# Patient Record
Sex: Female | Born: 1945 | Race: White | Hispanic: No | Smoking: Former smoker
Health system: Southern US, Community
[De-identification: ages and names within clinical notes are randomized; demographics above are authoritative.]

## PROBLEM LIST (undated history)

## (undated) DIAGNOSIS — C4490 Unspecified malignant neoplasm of skin, unspecified: Secondary | ICD-10-CM

## (undated) DIAGNOSIS — K648 Other hemorrhoids: Secondary | ICD-10-CM

## (undated) DIAGNOSIS — E785 Hyperlipidemia, unspecified: Secondary | ICD-10-CM

## (undated) DIAGNOSIS — F32A Depression, unspecified: Secondary | ICD-10-CM

## (undated) DIAGNOSIS — R0602 Shortness of breath: Secondary | ICD-10-CM

## (undated) DIAGNOSIS — Z9981 Dependence on supplemental oxygen: Secondary | ICD-10-CM

## (undated) DIAGNOSIS — J449 Chronic obstructive pulmonary disease, unspecified: Secondary | ICD-10-CM

## (undated) DIAGNOSIS — C3490 Malignant neoplasm of unspecified part of unspecified bronchus or lung: Secondary | ICD-10-CM

## (undated) DIAGNOSIS — IMO0001 Reserved for inherently not codable concepts without codable children: Secondary | ICD-10-CM

## (undated) DIAGNOSIS — Z923 Personal history of irradiation: Secondary | ICD-10-CM

## (undated) DIAGNOSIS — Z973 Presence of spectacles and contact lenses: Secondary | ICD-10-CM

## (undated) DIAGNOSIS — F1011 Alcohol abuse, in remission: Secondary | ICD-10-CM

## (undated) DIAGNOSIS — R0902 Hypoxemia: Secondary | ICD-10-CM

## (undated) DIAGNOSIS — IMO0002 Reserved for concepts with insufficient information to code with codable children: Secondary | ICD-10-CM

## (undated) DIAGNOSIS — K5790 Diverticulosis of intestine, part unspecified, without perforation or abscess without bleeding: Secondary | ICD-10-CM

## (undated) DIAGNOSIS — C50919 Malignant neoplasm of unspecified site of unspecified female breast: Secondary | ICD-10-CM

## (undated) DIAGNOSIS — F329 Major depressive disorder, single episode, unspecified: Secondary | ICD-10-CM

## (undated) HISTORY — DX: Major depressive disorder, single episode, unspecified: F32.9

## (undated) HISTORY — DX: Diverticulosis of intestine, part unspecified, without perforation or abscess without bleeding: K57.90

## (undated) HISTORY — DX: Reserved for inherently not codable concepts without codable children: IMO0001

## (undated) HISTORY — DX: Malignant neoplasm of unspecified part of unspecified bronchus or lung: C34.90

## (undated) HISTORY — DX: Reserved for concepts with insufficient information to code with codable children: IMO0002

## (undated) HISTORY — PX: DENTAL SURGERY: SHX609

## (undated) HISTORY — DX: Unspecified malignant neoplasm of skin, unspecified: C44.90

## (undated) HISTORY — DX: Chronic obstructive pulmonary disease, unspecified: J44.9

## (undated) HISTORY — DX: Hypoxemia: R09.02

## (undated) HISTORY — DX: Other hemorrhoids: K64.8

## (undated) HISTORY — DX: Hyperlipidemia, unspecified: E78.5

## (undated) HISTORY — DX: Depression, unspecified: F32.A

## (undated) HISTORY — DX: Alcohol abuse, in remission: F10.11

---

## 2001-06-02 HISTORY — PX: CHOLECYSTECTOMY: SHX55

## 2003-06-03 HISTORY — PX: EYE SURGERY: SHX253

## 2006-04-28 ENCOUNTER — Other Ambulatory Visit: Admission: RE | Admit: 2006-04-28 | Discharge: 2006-04-28 | Payer: Self-pay | Admitting: Internal Medicine

## 2006-04-28 LAB — HM PAP SMEAR

## 2008-03-10 ENCOUNTER — Ambulatory Visit: Payer: Self-pay | Admitting: Internal Medicine

## 2008-09-12 ENCOUNTER — Ambulatory Visit: Payer: Self-pay | Admitting: Internal Medicine

## 2008-11-01 ENCOUNTER — Ambulatory Visit: Payer: Self-pay | Admitting: Internal Medicine

## 2008-12-18 ENCOUNTER — Ambulatory Visit: Payer: Self-pay | Admitting: Internal Medicine

## 2009-03-22 ENCOUNTER — Ambulatory Visit: Payer: Self-pay | Admitting: Internal Medicine

## 2009-06-05 ENCOUNTER — Encounter
Admission: RE | Admit: 2009-06-05 | Discharge: 2009-06-05 | Payer: Self-pay | Source: Home / Self Care | Admitting: Internal Medicine

## 2009-09-14 ENCOUNTER — Ambulatory Visit: Payer: Self-pay | Admitting: Internal Medicine

## 2010-03-21 ENCOUNTER — Ambulatory Visit: Payer: Self-pay | Admitting: Internal Medicine

## 2010-06-19 ENCOUNTER — Ambulatory Visit (HOSPITAL_COMMUNITY)
Admission: RE | Admit: 2010-06-19 | Discharge: 2010-06-19 | Payer: Self-pay | Source: Home / Self Care | Attending: Internal Medicine | Admitting: Internal Medicine

## 2010-06-20 ENCOUNTER — Encounter (HOSPITAL_COMMUNITY)
Admission: RE | Admit: 2010-06-20 | Discharge: 2010-07-02 | Payer: Self-pay | Source: Home / Self Care | Attending: Internal Medicine | Admitting: Internal Medicine

## 2010-07-04 ENCOUNTER — Encounter (HOSPITAL_COMMUNITY): Payer: MEDICARE | Attending: Internal Medicine

## 2010-07-04 DIAGNOSIS — Z5189 Encounter for other specified aftercare: Secondary | ICD-10-CM | POA: Insufficient documentation

## 2010-07-04 DIAGNOSIS — J449 Chronic obstructive pulmonary disease, unspecified: Secondary | ICD-10-CM | POA: Insufficient documentation

## 2010-07-04 DIAGNOSIS — J4489 Other specified chronic obstructive pulmonary disease: Secondary | ICD-10-CM | POA: Insufficient documentation

## 2010-07-09 ENCOUNTER — Encounter (HOSPITAL_COMMUNITY): Payer: MEDICARE

## 2010-07-11 ENCOUNTER — Encounter (HOSPITAL_COMMUNITY): Payer: MEDICARE

## 2010-07-16 ENCOUNTER — Encounter (HOSPITAL_COMMUNITY): Payer: MEDICARE

## 2010-07-18 ENCOUNTER — Encounter (HOSPITAL_COMMUNITY): Payer: MEDICARE

## 2010-07-23 ENCOUNTER — Encounter (HOSPITAL_COMMUNITY): Payer: MEDICARE

## 2010-07-25 ENCOUNTER — Encounter (HOSPITAL_COMMUNITY): Payer: MEDICARE

## 2010-07-30 ENCOUNTER — Encounter (HOSPITAL_COMMUNITY): Payer: MEDICARE

## 2010-08-01 ENCOUNTER — Encounter (HOSPITAL_COMMUNITY): Payer: MEDICARE | Attending: Internal Medicine

## 2010-08-01 DIAGNOSIS — J449 Chronic obstructive pulmonary disease, unspecified: Secondary | ICD-10-CM | POA: Insufficient documentation

## 2010-08-01 DIAGNOSIS — J4489 Other specified chronic obstructive pulmonary disease: Secondary | ICD-10-CM | POA: Insufficient documentation

## 2010-08-01 DIAGNOSIS — Z5189 Encounter for other specified aftercare: Secondary | ICD-10-CM | POA: Insufficient documentation

## 2010-08-06 ENCOUNTER — Encounter (HOSPITAL_COMMUNITY): Payer: MEDICARE

## 2010-08-08 ENCOUNTER — Encounter (HOSPITAL_COMMUNITY): Payer: MEDICARE

## 2010-08-13 ENCOUNTER — Encounter (HOSPITAL_COMMUNITY): Payer: MEDICARE

## 2010-08-15 ENCOUNTER — Encounter (HOSPITAL_COMMUNITY): Payer: MEDICARE

## 2010-08-20 ENCOUNTER — Encounter (HOSPITAL_COMMUNITY): Payer: MEDICARE

## 2010-08-22 ENCOUNTER — Encounter (HOSPITAL_COMMUNITY): Payer: MEDICARE

## 2010-08-27 ENCOUNTER — Encounter (HOSPITAL_COMMUNITY): Payer: MEDICARE

## 2010-08-29 ENCOUNTER — Encounter (HOSPITAL_COMMUNITY): Payer: MEDICARE

## 2010-09-03 ENCOUNTER — Encounter (HOSPITAL_COMMUNITY): Payer: MEDICARE | Attending: Internal Medicine

## 2010-09-03 DIAGNOSIS — J449 Chronic obstructive pulmonary disease, unspecified: Secondary | ICD-10-CM | POA: Insufficient documentation

## 2010-09-03 DIAGNOSIS — J4489 Other specified chronic obstructive pulmonary disease: Secondary | ICD-10-CM | POA: Insufficient documentation

## 2010-09-03 DIAGNOSIS — Z5189 Encounter for other specified aftercare: Secondary | ICD-10-CM | POA: Insufficient documentation

## 2010-09-05 ENCOUNTER — Encounter (HOSPITAL_COMMUNITY): Payer: MEDICARE

## 2010-09-10 ENCOUNTER — Encounter (HOSPITAL_COMMUNITY): Payer: MEDICARE

## 2010-09-12 ENCOUNTER — Encounter (HOSPITAL_COMMUNITY): Payer: MEDICARE

## 2010-09-16 ENCOUNTER — Encounter (INDEPENDENT_AMBULATORY_CARE_PROVIDER_SITE_OTHER): Payer: MEDICARE | Admitting: Internal Medicine

## 2010-09-16 DIAGNOSIS — E785 Hyperlipidemia, unspecified: Secondary | ICD-10-CM

## 2010-09-16 DIAGNOSIS — J449 Chronic obstructive pulmonary disease, unspecified: Secondary | ICD-10-CM

## 2010-09-17 ENCOUNTER — Encounter (HOSPITAL_COMMUNITY): Payer: MEDICARE

## 2010-09-19 ENCOUNTER — Encounter (HOSPITAL_COMMUNITY): Payer: MEDICARE

## 2010-09-24 ENCOUNTER — Encounter (HOSPITAL_COMMUNITY)
Admission: RE | Admit: 2010-09-24 | Discharge: 2010-09-24 | Disposition: A | Discharge status: Home / Self Care | Payer: MEDICARE | Source: Ambulatory Visit | Attending: Internal Medicine | Admitting: Internal Medicine

## 2010-09-24 ENCOUNTER — Encounter (HOSPITAL_COMMUNITY): Payer: MEDICARE

## 2010-09-24 DIAGNOSIS — J449 Chronic obstructive pulmonary disease, unspecified: Secondary | ICD-10-CM | POA: Insufficient documentation

## 2010-09-24 DIAGNOSIS — Z5189 Encounter for other specified aftercare: Secondary | ICD-10-CM | POA: Insufficient documentation

## 2010-09-24 DIAGNOSIS — J4489 Other specified chronic obstructive pulmonary disease: Secondary | ICD-10-CM | POA: Insufficient documentation

## 2010-09-26 ENCOUNTER — Encounter (HOSPITAL_COMMUNITY): Payer: MEDICARE

## 2010-10-01 ENCOUNTER — Encounter (HOSPITAL_COMMUNITY): Payer: MEDICARE | Attending: Internal Medicine

## 2010-10-01 ENCOUNTER — Encounter (HOSPITAL_COMMUNITY): Payer: MEDICARE

## 2010-10-01 DIAGNOSIS — Z5189 Encounter for other specified aftercare: Secondary | ICD-10-CM | POA: Insufficient documentation

## 2010-10-01 DIAGNOSIS — J4489 Other specified chronic obstructive pulmonary disease: Secondary | ICD-10-CM | POA: Insufficient documentation

## 2010-10-01 DIAGNOSIS — J449 Chronic obstructive pulmonary disease, unspecified: Secondary | ICD-10-CM | POA: Insufficient documentation

## 2010-10-03 ENCOUNTER — Encounter (HOSPITAL_COMMUNITY): Payer: MEDICARE

## 2010-10-08 ENCOUNTER — Encounter (HOSPITAL_COMMUNITY): Payer: MEDICARE

## 2010-10-10 ENCOUNTER — Encounter (HOSPITAL_COMMUNITY): Payer: MEDICARE

## 2010-10-15 ENCOUNTER — Encounter (HOSPITAL_COMMUNITY): Payer: MEDICARE

## 2010-10-17 ENCOUNTER — Encounter (HOSPITAL_COMMUNITY): Payer: MEDICARE

## 2010-10-22 ENCOUNTER — Encounter (HOSPITAL_COMMUNITY): Payer: MEDICARE

## 2010-10-24 ENCOUNTER — Encounter (HOSPITAL_COMMUNITY): Payer: MEDICARE

## 2010-10-29 ENCOUNTER — Encounter (HOSPITAL_COMMUNITY): Payer: MEDICARE

## 2010-10-31 ENCOUNTER — Encounter (HOSPITAL_COMMUNITY): Payer: MEDICARE

## 2010-11-05 ENCOUNTER — Encounter (HOSPITAL_COMMUNITY): Payer: MEDICARE | Attending: Internal Medicine

## 2010-11-05 DIAGNOSIS — J4489 Other specified chronic obstructive pulmonary disease: Secondary | ICD-10-CM | POA: Insufficient documentation

## 2010-11-05 DIAGNOSIS — Z5189 Encounter for other specified aftercare: Secondary | ICD-10-CM | POA: Insufficient documentation

## 2010-11-05 DIAGNOSIS — J449 Chronic obstructive pulmonary disease, unspecified: Secondary | ICD-10-CM | POA: Insufficient documentation

## 2010-11-07 ENCOUNTER — Encounter (HOSPITAL_COMMUNITY): Payer: MEDICARE

## 2010-11-12 ENCOUNTER — Encounter (HOSPITAL_COMMUNITY): Payer: MEDICARE

## 2010-11-14 ENCOUNTER — Encounter (HOSPITAL_COMMUNITY): Payer: MEDICARE

## 2010-11-19 ENCOUNTER — Encounter (HOSPITAL_COMMUNITY): Payer: MEDICARE

## 2010-11-21 ENCOUNTER — Encounter (HOSPITAL_COMMUNITY): Payer: MEDICARE

## 2010-11-26 ENCOUNTER — Encounter (HOSPITAL_COMMUNITY): Payer: MEDICARE

## 2010-11-28 ENCOUNTER — Encounter (HOSPITAL_COMMUNITY): Payer: MEDICARE

## 2010-12-03 ENCOUNTER — Encounter (HOSPITAL_COMMUNITY): Payer: MEDICARE | Attending: Internal Medicine

## 2010-12-03 DIAGNOSIS — J449 Chronic obstructive pulmonary disease, unspecified: Secondary | ICD-10-CM | POA: Insufficient documentation

## 2010-12-03 DIAGNOSIS — Z5189 Encounter for other specified aftercare: Secondary | ICD-10-CM | POA: Insufficient documentation

## 2010-12-03 DIAGNOSIS — J4489 Other specified chronic obstructive pulmonary disease: Secondary | ICD-10-CM | POA: Insufficient documentation

## 2010-12-04 ENCOUNTER — Other Ambulatory Visit: Payer: Self-pay | Admitting: Internal Medicine

## 2010-12-05 ENCOUNTER — Encounter (HOSPITAL_COMMUNITY): Payer: MEDICARE

## 2010-12-10 ENCOUNTER — Encounter (HOSPITAL_COMMUNITY): Payer: MEDICARE

## 2010-12-12 ENCOUNTER — Encounter (HOSPITAL_COMMUNITY): Payer: MEDICARE

## 2010-12-17 ENCOUNTER — Encounter (HOSPITAL_COMMUNITY): Payer: MEDICARE

## 2010-12-19 ENCOUNTER — Encounter (HOSPITAL_COMMUNITY): Payer: MEDICARE

## 2010-12-24 ENCOUNTER — Encounter (HOSPITAL_COMMUNITY): Payer: MEDICARE

## 2010-12-26 ENCOUNTER — Encounter (HOSPITAL_COMMUNITY): Payer: MEDICARE

## 2010-12-31 ENCOUNTER — Encounter (HOSPITAL_COMMUNITY): Payer: MEDICARE

## 2011-01-02 ENCOUNTER — Encounter (HOSPITAL_COMMUNITY): Payer: MEDICARE | Attending: Internal Medicine

## 2011-01-02 DIAGNOSIS — J449 Chronic obstructive pulmonary disease, unspecified: Secondary | ICD-10-CM | POA: Insufficient documentation

## 2011-01-02 DIAGNOSIS — Z5189 Encounter for other specified aftercare: Secondary | ICD-10-CM | POA: Insufficient documentation

## 2011-01-02 DIAGNOSIS — J4489 Other specified chronic obstructive pulmonary disease: Secondary | ICD-10-CM | POA: Insufficient documentation

## 2011-01-07 ENCOUNTER — Encounter (HOSPITAL_COMMUNITY): Payer: MEDICARE

## 2011-01-09 ENCOUNTER — Encounter (HOSPITAL_COMMUNITY): Payer: MEDICARE

## 2011-01-14 ENCOUNTER — Encounter (HOSPITAL_COMMUNITY): Payer: MEDICARE

## 2011-01-16 ENCOUNTER — Encounter (HOSPITAL_COMMUNITY): Payer: MEDICARE

## 2011-01-21 ENCOUNTER — Encounter (HOSPITAL_COMMUNITY): Payer: MEDICARE

## 2011-01-23 ENCOUNTER — Encounter (HOSPITAL_COMMUNITY): Payer: MEDICARE

## 2011-01-28 ENCOUNTER — Encounter (HOSPITAL_COMMUNITY): Payer: MEDICARE

## 2011-01-30 ENCOUNTER — Encounter (HOSPITAL_COMMUNITY): Payer: MEDICARE

## 2011-02-04 ENCOUNTER — Encounter (HOSPITAL_COMMUNITY): Payer: MEDICARE | Attending: Internal Medicine

## 2011-02-04 DIAGNOSIS — J449 Chronic obstructive pulmonary disease, unspecified: Secondary | ICD-10-CM | POA: Insufficient documentation

## 2011-02-04 DIAGNOSIS — Z5189 Encounter for other specified aftercare: Secondary | ICD-10-CM | POA: Insufficient documentation

## 2011-02-04 DIAGNOSIS — J4489 Other specified chronic obstructive pulmonary disease: Secondary | ICD-10-CM | POA: Insufficient documentation

## 2011-02-06 ENCOUNTER — Encounter (HOSPITAL_COMMUNITY): Payer: MEDICARE

## 2011-02-11 ENCOUNTER — Encounter (HOSPITAL_COMMUNITY): Payer: MEDICARE

## 2011-02-13 ENCOUNTER — Encounter (HOSPITAL_COMMUNITY): Payer: MEDICARE

## 2011-02-18 ENCOUNTER — Encounter (HOSPITAL_COMMUNITY): Payer: MEDICARE

## 2011-02-20 ENCOUNTER — Encounter (HOSPITAL_COMMUNITY): Payer: MEDICARE

## 2011-02-25 ENCOUNTER — Encounter (HOSPITAL_COMMUNITY): Payer: MEDICARE

## 2011-02-27 ENCOUNTER — Encounter (HOSPITAL_COMMUNITY): Payer: MEDICARE

## 2011-03-04 ENCOUNTER — Encounter (HOSPITAL_COMMUNITY): Payer: MEDICARE

## 2011-03-06 ENCOUNTER — Encounter (HOSPITAL_COMMUNITY): Payer: MEDICARE

## 2011-03-11 ENCOUNTER — Encounter (HOSPITAL_COMMUNITY): Payer: MEDICARE

## 2011-03-13 ENCOUNTER — Encounter (HOSPITAL_COMMUNITY): Payer: MEDICARE

## 2011-03-18 ENCOUNTER — Encounter (HOSPITAL_COMMUNITY): Payer: MEDICARE

## 2011-03-20 ENCOUNTER — Ambulatory Visit: Payer: MEDICARE | Admitting: Internal Medicine

## 2011-03-20 ENCOUNTER — Encounter (HOSPITAL_COMMUNITY): Payer: MEDICARE

## 2011-03-25 ENCOUNTER — Encounter (HOSPITAL_COMMUNITY): Payer: MEDICARE

## 2011-03-27 ENCOUNTER — Encounter (HOSPITAL_COMMUNITY): Payer: MEDICARE

## 2011-04-01 ENCOUNTER — Encounter (HOSPITAL_COMMUNITY): Payer: MEDICARE

## 2011-04-03 ENCOUNTER — Encounter (HOSPITAL_COMMUNITY): Payer: MEDICARE

## 2011-04-07 ENCOUNTER — Ambulatory Visit: Payer: MEDICARE | Admitting: Internal Medicine

## 2011-04-08 ENCOUNTER — Encounter (HOSPITAL_COMMUNITY): Payer: MEDICARE

## 2011-04-08 ENCOUNTER — Encounter: Payer: Self-pay | Admitting: Internal Medicine

## 2011-04-10 ENCOUNTER — Encounter (HOSPITAL_COMMUNITY): Payer: MEDICARE

## 2011-04-11 ENCOUNTER — Encounter: Payer: Self-pay | Admitting: Internal Medicine

## 2011-04-11 ENCOUNTER — Ambulatory Visit: Payer: MEDICARE | Admitting: Internal Medicine

## 2011-04-11 ENCOUNTER — Ambulatory Visit (INDEPENDENT_AMBULATORY_CARE_PROVIDER_SITE_OTHER): Payer: MEDICARE | Admitting: Internal Medicine

## 2011-04-11 DIAGNOSIS — E785 Hyperlipidemia, unspecified: Secondary | ICD-10-CM

## 2011-04-11 DIAGNOSIS — F259 Schizoaffective disorder, unspecified: Secondary | ICD-10-CM

## 2011-04-11 DIAGNOSIS — Z79899 Other long term (current) drug therapy: Secondary | ICD-10-CM

## 2011-04-11 DIAGNOSIS — J449 Chronic obstructive pulmonary disease, unspecified: Secondary | ICD-10-CM

## 2011-04-11 DIAGNOSIS — E559 Vitamin D deficiency, unspecified: Secondary | ICD-10-CM

## 2011-04-11 DIAGNOSIS — J441 Chronic obstructive pulmonary disease with (acute) exacerbation: Secondary | ICD-10-CM | POA: Insufficient documentation

## 2011-04-11 LAB — HEPATIC FUNCTION PANEL
Bilirubin, Direct: 0.2 mg/dL (ref 0.0–0.3)
Total Bilirubin: 0.7 mg/dL (ref 0.3–1.2)
Total Protein: 7.1 g/dL (ref 6.0–8.3)

## 2011-04-11 LAB — LIPID PANEL
Total CHOL/HDL Ratio: 3.1 Ratio
Triglycerides: 115 mg/dL (ref <150)

## 2011-04-11 NOTE — Progress Notes (Signed)
  Subjective:    Patient ID: Summer Williams, female    DOB: June 18, 1945, 65 y.o.   MRN: [id number]  HPI  65 year old white female with history of hyperlipidemia, vitamin D deficiency, COPD and schizoaffective disorder for six-month recheck. Fasting lipid panel liver functions drawn on Lipitor 40 mg daily. She also uses Advair and albuterol. I tried to enroll her in pulmonary rehabilitation class at [facility] but apparently the class was full. History of cholecystectomy 2003 when she lived in [place]. History of cataract extraction right eye by Dr. Dagoberto Ligas 2005. Pneumovax given April 2011, she declines influenza immunization, T. Dap given April 2011. Patient resides with her elderly mother. She is a recovering alcoholic and last had an alcoholic drink in 1980. 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 She has a son who lives here. Has never had colonoscopy. Declines to do so. No recent mammogram. Had chest x-ray January 2011 showing no active lung disease.  Family history mother with hypertension and hypothyroidism father died at 57 of sudden cardiac arrest  Social history: Patient has a college degree and is divorced. Quit smoking 2007 after smoking for some 50 years.    Review of Systems     Objective:   Physical Exam neck is supple without JVD thyromegaly or carotid bruits; chest clear to auscultation; cardiac: regular rate, and rhythm; extremities: without edema        Assessment & Plan:  Hyperlipidemia  COPD  Plan: Fasting lipid panel liver functions drawn and are pending of Lipitor. Return in 6 months for physical exam.

## 2011-04-11 NOTE — Patient Instructions (Signed)
Continue Lipitor as directed. Continue using albuterol and Advair inhalers daily. Return in 6 months for physical exam.

## 2011-04-15 ENCOUNTER — Encounter (HOSPITAL_COMMUNITY): Payer: MEDICARE

## 2011-04-16 ENCOUNTER — Telehealth: Payer: Self-pay

## 2011-04-16 DIAGNOSIS — R6889 Other general symptoms and signs: Secondary | ICD-10-CM

## 2011-04-16 NOTE — Telephone Encounter (Signed)
Patient scheduledfor abd ultrasound on 04/18/2011 at 8:15 at [facility], [address]. Appt to discuss findings on Tuesday 04/22/2011 at 3:30pm

## 2011-04-17 ENCOUNTER — Encounter (HOSPITAL_COMMUNITY): Payer: MEDICARE

## 2011-04-18 ENCOUNTER — Ambulatory Visit
Admission: RE | Admit: 2011-04-18 | Discharge: 2011-04-18 | Disposition: A | Discharge status: Home / Self Care | Payer: Medicare Other | Source: Ambulatory Visit | Attending: Internal Medicine | Admitting: Internal Medicine

## 2011-04-18 DIAGNOSIS — R6889 Other general symptoms and signs: Secondary | ICD-10-CM

## 2011-04-22 ENCOUNTER — Encounter (HOSPITAL_COMMUNITY): Payer: MEDICARE

## 2011-04-22 ENCOUNTER — Ambulatory Visit (INDEPENDENT_AMBULATORY_CARE_PROVIDER_SITE_OTHER): Payer: MEDICARE | Admitting: Internal Medicine

## 2011-04-22 ENCOUNTER — Encounter: Payer: Self-pay | Admitting: Internal Medicine

## 2011-04-22 DIAGNOSIS — R748 Abnormal levels of other serum enzymes: Secondary | ICD-10-CM

## 2011-04-22 DIAGNOSIS — K7689 Other specified diseases of liver: Secondary | ICD-10-CM

## 2011-04-22 DIAGNOSIS — K760 Fatty (change of) liver, not elsewhere classified: Secondary | ICD-10-CM

## 2011-04-22 DIAGNOSIS — E785 Hyperlipidemia, unspecified: Secondary | ICD-10-CM

## 2011-04-22 DIAGNOSIS — F259 Schizoaffective disorder, unspecified: Secondary | ICD-10-CM

## 2011-04-24 ENCOUNTER — Encounter (HOSPITAL_COMMUNITY): Payer: MEDICARE

## 2011-04-29 ENCOUNTER — Encounter (HOSPITAL_COMMUNITY): Payer: MEDICARE

## 2011-05-01 ENCOUNTER — Encounter (HOSPITAL_COMMUNITY): Payer: MEDICARE

## 2011-05-03 DIAGNOSIS — R748 Abnormal levels of other serum enzymes: Secondary | ICD-10-CM | POA: Insufficient documentation

## 2011-05-03 NOTE — Progress Notes (Signed)
  Subjective:    Patient ID: Summer Williams, female    DOB: 12-18-1945, 65 y.o.   MRN: [id number]  HPI patient was here recently for evaluation of hyperlipidemia. Liver enzymes were noted to be a bit elevated and I suggested she undergo further evaluation with an ultrasound of liver gallbladder and pancreas. She does have a history of schizoaffective disorder and takes multiple medications to control including antidepressants and anti-anxiety medications. Denies alcohol consumption. Not taking any over-the-counter medications to excess. Recent ultrasound shows no evidence of bile but dilatation just probable fatty liver infiltration.    Review of Systems     Objective:   Physical Exam chest clear, cardiac exam regular rate and rhythm; abdomen no hepatosplenomegaly masses or tenderness        Assessment & Plan:  Elevated liver enzymes-could be do to fatty liver or medication related  Fatty liver infiltration  History of schizoaffective disorder on multiple medications  Plan recheck liver functions in 3-6 months

## 2011-05-03 NOTE — Patient Instructions (Signed)
No evidence of gallbladder disease. Try to lose some weight. Antidepressant and anti-anxiety medications could contribute to elevated liver functions in addition to obesity. We can consider stopping statin medication if liver functions continued to rise.

## 2011-05-06 ENCOUNTER — Encounter (HOSPITAL_COMMUNITY): Payer: MEDICARE

## 2011-05-08 ENCOUNTER — Encounter (HOSPITAL_COMMUNITY): Payer: MEDICARE

## 2011-05-13 ENCOUNTER — Encounter (HOSPITAL_COMMUNITY): Payer: MEDICARE

## 2011-05-15 ENCOUNTER — Encounter (HOSPITAL_COMMUNITY): Payer: MEDICARE

## 2011-05-20 ENCOUNTER — Encounter (HOSPITAL_COMMUNITY): Payer: MEDICARE

## 2011-05-22 ENCOUNTER — Encounter (HOSPITAL_COMMUNITY): Payer: MEDICARE

## 2011-05-27 ENCOUNTER — Encounter (HOSPITAL_COMMUNITY): Payer: MEDICARE

## 2011-05-29 ENCOUNTER — Encounter (HOSPITAL_COMMUNITY): Payer: MEDICARE

## 2011-06-03 ENCOUNTER — Encounter (HOSPITAL_COMMUNITY): Payer: MEDICARE

## 2011-06-05 ENCOUNTER — Encounter (HOSPITAL_COMMUNITY): Payer: MEDICARE

## 2011-06-05 ENCOUNTER — Other Ambulatory Visit: Payer: MEDICARE | Admitting: Internal Medicine

## 2011-06-05 DIAGNOSIS — R6889 Other general symptoms and signs: Secondary | ICD-10-CM

## 2011-06-05 LAB — HEPATIC FUNCTION PANEL
Albumin: 4.3 g/dL (ref 3.5–5.2)
Alkaline Phosphatase: 115 U/L (ref 39–117)
Indirect Bilirubin: 0.5 mg/dL (ref 0.0–0.9)
Total Bilirubin: 0.6 mg/dL (ref 0.3–1.2)

## 2011-06-10 ENCOUNTER — Encounter (HOSPITAL_COMMUNITY): Payer: MEDICARE

## 2011-06-12 ENCOUNTER — Encounter (HOSPITAL_COMMUNITY): Payer: MEDICARE

## 2011-06-17 ENCOUNTER — Encounter (HOSPITAL_COMMUNITY): Payer: MEDICARE

## 2011-06-19 ENCOUNTER — Encounter (HOSPITAL_COMMUNITY): Payer: MEDICARE

## 2011-06-26 ENCOUNTER — Encounter (HOSPITAL_COMMUNITY): Payer: MEDICARE

## 2011-06-30 ENCOUNTER — Other Ambulatory Visit: Payer: Self-pay | Admitting: Internal Medicine

## 2011-07-01 ENCOUNTER — Encounter (HOSPITAL_COMMUNITY): Payer: MEDICARE

## 2011-07-03 ENCOUNTER — Encounter (HOSPITAL_COMMUNITY): Payer: MEDICARE

## 2011-07-08 ENCOUNTER — Encounter (HOSPITAL_COMMUNITY): Payer: MEDICARE

## 2011-07-10 ENCOUNTER — Encounter (HOSPITAL_COMMUNITY): Payer: MEDICARE

## 2011-07-15 ENCOUNTER — Encounter (HOSPITAL_COMMUNITY): Payer: MEDICARE

## 2011-07-17 ENCOUNTER — Encounter (HOSPITAL_COMMUNITY): Payer: MEDICARE

## 2011-07-22 ENCOUNTER — Encounter (HOSPITAL_COMMUNITY): Payer: MEDICARE

## 2011-07-24 ENCOUNTER — Encounter (HOSPITAL_COMMUNITY): Payer: MEDICARE

## 2011-07-29 ENCOUNTER — Encounter (HOSPITAL_COMMUNITY): Payer: MEDICARE

## 2011-10-17 ENCOUNTER — Ambulatory Visit (INDEPENDENT_AMBULATORY_CARE_PROVIDER_SITE_OTHER): Payer: MEDICARE | Admitting: Internal Medicine

## 2011-10-17 ENCOUNTER — Encounter: Payer: Self-pay | Admitting: Internal Medicine

## 2011-10-17 VITALS — BP 124/74 | HR 80 | Temp 97.9°F | Resp 12 | Ht 64.25 in | Wt 186.0 lb

## 2011-10-17 DIAGNOSIS — E785 Hyperlipidemia, unspecified: Secondary | ICD-10-CM

## 2011-10-17 DIAGNOSIS — Z0000 Encounter for general adult medical examination without abnormal findings: Secondary | ICD-10-CM

## 2011-10-17 DIAGNOSIS — R748 Abnormal levels of other serum enzymes: Secondary | ICD-10-CM

## 2011-10-17 DIAGNOSIS — E669 Obesity, unspecified: Secondary | ICD-10-CM

## 2011-10-17 DIAGNOSIS — F259 Schizoaffective disorder, unspecified: Secondary | ICD-10-CM

## 2011-10-17 DIAGNOSIS — E559 Vitamin D deficiency, unspecified: Secondary | ICD-10-CM

## 2011-10-17 DIAGNOSIS — J449 Chronic obstructive pulmonary disease, unspecified: Secondary | ICD-10-CM

## 2011-10-17 LAB — COMPREHENSIVE METABOLIC PANEL
Albumin: 4.6 g/dL (ref 3.5–5.2)
Alkaline Phosphatase: 101 U/L (ref 39–117)
BUN: 11 mg/dL (ref 6–23)
CO2: 27 mEq/L (ref 19–32)
Calcium: 9.3 mg/dL (ref 8.4–10.5)
Chloride: 104 mEq/L (ref 96–112)
Glucose, Bld: 102 mg/dL — ABNORMAL HIGH (ref 70–99)
Potassium: 4 mEq/L (ref 3.5–5.3)
Sodium: 142 mEq/L (ref 135–145)
Total Bilirubin: 1 mg/dL (ref 0.3–1.2)

## 2011-10-17 LAB — CBC WITH DIFFERENTIAL/PLATELET
Basophils Absolute: 0 10*3/uL (ref 0.0–0.1)
MCH: 32.1 pg (ref 26.0–34.0)
MCHC: 34 g/dL (ref 30.0–36.0)
MCV: 94.4 fL (ref 78.0–100.0)
Monocytes Absolute: 1 10*3/uL (ref 0.1–1.0)
Monocytes Relative: 16 % — ABNORMAL HIGH (ref 3–12)
RBC: 4.64 MIL/uL (ref 3.87–5.11)
RDW: 14.3 % (ref 11.5–15.5)
WBC: 6.5 10*3/uL (ref 4.0–10.5)

## 2011-10-17 LAB — TSH: TSH: 4.298 u[IU]/mL (ref 0.350–4.500)

## 2011-10-17 LAB — POCT URINALYSIS DIPSTICK
Bilirubin, UA: NEGATIVE
Leukocytes, UA: NEGATIVE

## 2011-10-17 LAB — LIPID PANEL
Total CHOL/HDL Ratio: 2.9 Ratio
Triglycerides: 171 mg/dL — ABNORMAL HIGH (ref <150)
VLDL: 34 mg/dL (ref 0–40)

## 2011-10-17 NOTE — Progress Notes (Signed)
Subjective:    Patient ID: Summer Williams, female    DOB: Jun 27, 1945, 66 y.o.   MRN: [id number]  HPI 66 year old White female with hx of depression, hyperlipidemia and allergic rhinitis. History of elevated liver functions presumably due to fatty liver and not statin therapy. Was on statin therapy for a long time before we noticed elevated liver functions. Had upper abdominal ultrasound done November 2012 showing mild diffuse fatty liver. Gallbladder had been surgically removed. Not really motivated to diet and exercise.  She has history of alcohol abuse in the remote past. Cholecystectomy 15-Nov-2001 in [place]. Cataract extraction right eye by Dr. Dagoberto Ligas 2005.Tdap vaccine April 2011. Pneumovax immunization April 2011. Sees Dr. Nolen Mu for psychiatric care. She has been going to pulmonary rehabilitation classes. She does not want to be on home oxygen. Zyprexa may be contributing to her weight gain. Never had colonoscopy or flexible sigmoidoscopy and refuses. Refuses breast and pelvic exam. Refuses mammogram.  Social history she is divorced and lives with her mother. Mother is in her 39s. 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 She smoked up to a pack of cigarettes daily for many years but quit smoking in early November 15, 2005 after having smoked for 50 years. She last had an alcoholic drink in 1978/11/16.  Family history father died at age 43 of cardiac arrest. She said father had history of epilepsy. Grandmother died of suicide. Both grandfathers with history of cancer. She has a son who lives here in [place].    Review of Systems  Constitutional: Negative.   HENT: Negative.   Eyes: Negative.   Respiratory: Negative.   Cardiovascular: Negative.   Gastrointestinal: Negative.   Genitourinary: Negative.   Musculoskeletal: Positive for arthralgias.  Neurological: Negative.   Hematological: Negative.   Psychiatric/Behavioral: Positive for dysphoric mood.         Objective:   Physical Exam  Vitals reviewed. Constitutional: She is oriented to person, place, and time. She appears well-developed and well-nourished. No distress.  HENT:  Head: Normocephalic and atraumatic.  Right Ear: External ear normal.  Left Ear: External ear normal.  Nose: Nose normal.  Mouth/Throat: Oropharynx is clear and moist. No oropharyngeal exudate.  Eyes: Conjunctivae and EOM are normal. Pupils are equal, round, and reactive to light. Right eye exhibits no discharge. Left eye exhibits no discharge. No scleral icterus.  Neck: Normal range of motion. Neck supple. No JVD present. No thyromegaly present.  Cardiovascular: Normal rate, regular rhythm, normal heart sounds and intact distal pulses.   No murmur heard. Pulmonary/Chest: Effort normal. No respiratory distress. She has wheezes. She has no rales. She exhibits no tenderness.       Breast exam - pt refused  Abdominal: Soft. Bowel sounds are normal. She exhibits no distension. There is no tenderness. There is no rebound and no guarding.  Genitourinary:       Pt refused  Musculoskeletal: Normal range of motion. She exhibits no edema.  Lymphadenopathy:    She has no cervical adenopathy.  Neurological: She is alert and oriented to person, place, and time. She has normal reflexes. No cranial nerve deficit. Coordination normal.  Skin: Skin is warm and dry. She is not diaphoretic.  Psychiatric: She has a normal mood and affect. Her behavior is normal. Judgment and thought content normal.   Subjective:   Patient presents for Medicare Annual/Subsequent preventive examination.   Review Past Medical/Family/Social: See above   Risk Factors  Current exercise habits:  Dietary issues discussed:   Cardiac risk factors: Obesity  Depression Screen  (Note: if answer to either of the following is "Yes", a more complete depression screening is indicated)  Over the past two weeks, have you felt down, depressed or hopeless? Yes   Over the past two weeks, have you felt little interest or pleasure in doing things? Yes  Have you lost interest or pleasure in daily life?no Do you often feel hopeless? no Do you cry easily over simple problems? No   Activities of Daily Living  In your present state of health, do you have any difficulty performing the following activities?:  Driving? No  Managing money? No  Feeding yourself? No  Getting from bed to chair? No  Climbing a flight of stairs? No  Preparing food and eating?: No  Bathing or showering? No  Getting dressed: No  Getting to the toilet? No  Using the toilet:No  Moving around from place to place: No  In the past year have you fallen or had a near fall?:No  Are you sexually active? No  Do you have more than one partner? No   Hearing Difficulties: No  Do you often ask people to speak up or repeat themselves? No  Do you experience ringing or noises in your ears? Yes  Do you have difficulty understanding soft or whispered voices? No  Do you feel that you have a problem with memory? no Do you often misplace items? No  Do you feel safe at home? Yes   Cognitive Testing  Alert? Yes Normal Appearance?Yes  Oriented to person? Yes Place? Yes  Time? Yes  Recall of three objects? Yes  Can perform simple calculations? Yes  Displays appropriate judgment?Yes  Can read the correct time from a watch face?Yes   List the Names of Other Physician/Practitioners you currently use:  Dr. Nolen Mu, psychiatry  Indicate any recent Medical Services you may have received from other than [facility] providers in the past year (date may be approximate).   Screening Tests / Date Colonoscopy  refuses                   Zostavax reminded Mammogram refuses Influenza Vaccine reminded   Tetanus/tdap April 2011   Objective:       General appearance: Appears stated age and mildly obese  Head: Normocephalic, without obvious abnormality, atraumatic  Eyes: conj clear, EOMi PEERLA   Ears: normal TM's and external ear canals both ears  Nose: Nares normal. Septum midline. Mucosa normal. No drainage or sinus tenderness.  Throat: lips, mucosa, and tongue normal; teeth and gums normal  Neck: no adenopathy, no carotid bruit, no JVD, supple, symmetrical, trachea midline and thyroid not enlarged, symmetric, no tenderness/mass/nodules  No CVA tenderness.  Lungs: clear to auscultation bilaterally  Breasts: normal appearance, no masses or tenderness, top of the pacemaker on left upper chest. Incision well-healed. It is tender.  Heart: regular rate and rhythm, S1, S2 normal, no murmur, click, rub or gallop  Abdomen: soft, non-tender; bowel sounds normal; no masses, no organomegaly  Musculoskeletal: ROM normal in all joints, no crepitus, no deformity, Normal muscle strengthen. Back  is symmetric, no curvature. Skin: Skin color, texture, turgor normal. No rashes or lesions  Lymph nodes: Cervical, supraclavicular, and axillary nodes normal.  Neurologic: CN 2 -12 Normal, Normal symmetric reflexes. Normal coordination and gait  Psych: Alert & Oriented x 3, Mood appear stable.    Assessment:    Annual wellness medicare exam   Plan:  During the course of the visit the patient was educated and counseled about appropriate screening and preventive services including:  Screening mammography  Colorectal cancer screening  Shingles vaccine. Prescription given so that she can get the vaccine at the pharmacy.    Medicare Attestation  I have personally reviewed:  The patient's medical and social history  Their use of alcohol, tobacco or illicit drugs  Their current medications and supplements  The patient's functional ability including ADLs,fall risks, home safety risks, cognitive, and hearing and visual impairment  Diet and physical activities  Evidence for depression or mood disorders  The patient's weight, height, BMI, and visual acuity have been recorded in the chart. I have made  referrals, counseling, and provided education to the patient based on review of the above and I have provided the patient with a written personalized care plan for preventive services.           Assessment & Plan:  Hyperlipidemia-stable on statin therapy. Allergic rhinitis History of schizoaffective disorder  COPD-stable  History of elevated liver functions presumably due to fatty liver rather than statin therapy  History of vitamin D deficiency  Obesity      Plan: Labs drawn today. Return in 6 months. Refuses colonoscopy. 3 hemoccult cards given. mamogram requisition given- she will consider it.

## 2011-10-18 LAB — VITAMIN D 25 HYDROXY (VIT D DEFICIENCY, FRACTURES): Vit D, 25-Hydroxy: 41 ng/mL (ref 30–89)

## 2011-12-02 ENCOUNTER — Encounter: Payer: Self-pay | Admitting: Internal Medicine

## 2011-12-28 ENCOUNTER — Other Ambulatory Visit: Payer: Self-pay | Admitting: Internal Medicine

## 2012-04-20 ENCOUNTER — Encounter: Payer: Self-pay | Admitting: Internal Medicine

## 2012-04-20 ENCOUNTER — Ambulatory Visit (INDEPENDENT_AMBULATORY_CARE_PROVIDER_SITE_OTHER): Payer: MEDICARE | Admitting: Internal Medicine

## 2012-04-20 VITALS — BP 112/68 | HR 76 | Temp 97.7°F | Wt 187.5 lb

## 2012-04-20 DIAGNOSIS — J449 Chronic obstructive pulmonary disease, unspecified: Secondary | ICD-10-CM

## 2012-04-20 DIAGNOSIS — F32A Depression, unspecified: Secondary | ICD-10-CM

## 2012-04-20 DIAGNOSIS — E785 Hyperlipidemia, unspecified: Secondary | ICD-10-CM

## 2012-04-20 DIAGNOSIS — K7689 Other specified diseases of liver: Secondary | ICD-10-CM

## 2012-04-20 DIAGNOSIS — Z79899 Other long term (current) drug therapy: Secondary | ICD-10-CM

## 2012-04-20 DIAGNOSIS — K760 Fatty (change of) liver, not elsewhere classified: Secondary | ICD-10-CM

## 2012-04-20 DIAGNOSIS — Z8639 Personal history of other endocrine, nutritional and metabolic disease: Secondary | ICD-10-CM

## 2012-04-20 DIAGNOSIS — F329 Major depressive disorder, single episode, unspecified: Secondary | ICD-10-CM

## 2012-04-20 NOTE — Patient Instructions (Addendum)
Restart vitamin D supplement daily. Consider Zostavax vaccine. Influenza immunization declined. Return in 6 months for physical exam.

## 2012-04-20 NOTE — Addendum Note (Signed)
Addended by: Judy Pimple on: 04/20/2012 11:55 AM   Modules accepted: Orders

## 2012-04-20 NOTE — Progress Notes (Signed)
  Subjective:    Patient ID: Summer Williams, female    DOB: 04/08/1946, 66 y.o.   MRN: [id number]  HPI For follow up hyperlipidemia. Pt is retired but caring for elderly 29 year old mother. Looking for agency to help give her a break. Pt. feeling confined and can't get out much. Daughter-in-law helps out a couple of hours a week. No other complaints. Taking lipid medication. Depression meds refilled by Dr. Nolen Mu. Declines influenza medication. Will consider Zostavax but reluctant. History of COPD. Tried inhaler but did not seem to do any good. Quit smoking 2007. Admits to stopping vitamin D supplementation. Reminded she needs to take this daily for levels will drop. History of elevated liver functions presumably due to fatty liver infiltration. Was on statin therapy for a long time before we noticed elevated liver functions. Abdominal ultrasound done November 2012 showed mild diffuse fatty liver. Gallbladder has been surgically removed. Not really motivated to diet and exercise.  History of alcohol abuse in the remote past. Cholecystectomy 2003 in [place]. Cataract extraction right eye Dr. Dagoberto Ligas 2005. Pneumovax immunization April 2011. Has been going to pulmonary rehabilitation classes. Does not want to be on home oxygen. Zyprexa likely contributing to her weight gain. Never had colonoscopy or flexible sigmoidoscopy and refuses to do so. Refuses breast and pelvic exam. Refuses mammogram.  She is divorced and lives with her mother. 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 She smoked up to a pack of cigarettes daily for many years. She smoked for 50 years. She last had an alcoholic drink in 1980.  Family history: Father died at age 36 of cardiac arrest. Father had history of epilepsy. Grandmother died of suicide. Both grandfathers with history of cancer. She has a son who resides here in [place].    Review of Systems     Objective:   Physical ExamChest is  clear to auscultation. Cor: RRR normal S1/S2. Ext: no edema. Skin is warm and dry. Pulses regular. Alert and oriented x3. Affect is a bit flat but appropriate. TMs and pharynx are clear. No hepatosplenomegaly.        Assessment & Plan:  COPD  Hyperlipidemia  Depression  History of fatty liver infiltration with mild elevated liver functions  History of vitamin D deficiency -has stopped vitamin D. supplement. Needs to resume this daily.  Plan: Return mid May for physical examination. Continue same medications. Prescription given for Zostavax vaccine if pharmacy if she so desires.  Fall screen: No falls in the past year

## 2012-04-21 LAB — HEPATIC FUNCTION PANEL
ALT: 50 U/L — ABNORMAL HIGH (ref 0–35)
AST: 60 U/L — ABNORMAL HIGH (ref 0–37)
Alkaline Phosphatase: 100 U/L (ref 39–117)
Bilirubin, Direct: 0.3 mg/dL (ref 0.0–0.3)
Indirect Bilirubin: 1 mg/dL — ABNORMAL HIGH (ref 0.0–0.9)
Total Bilirubin: 1.3 mg/dL — ABNORMAL HIGH (ref 0.3–1.2)
Total Protein: 7.4 g/dL (ref 6.0–8.3)

## 2012-04-21 LAB — LIPID PANEL
HDL: 54 mg/dL (ref >39)
Triglycerides: 154 mg/dL — ABNORMAL HIGH (ref <150)

## 2012-05-20 ENCOUNTER — Ambulatory Visit
Admission: RE | Admit: 2012-05-20 | Discharge: 2012-05-20 | Disposition: A | Discharge status: Home / Self Care | Payer: PRIVATE HEALTH INSURANCE | Source: Ambulatory Visit | Attending: Internal Medicine | Admitting: Internal Medicine

## 2012-05-20 ENCOUNTER — Ambulatory Visit (INDEPENDENT_AMBULATORY_CARE_PROVIDER_SITE_OTHER): Payer: PRIVATE HEALTH INSURANCE | Admitting: Internal Medicine

## 2012-05-20 ENCOUNTER — Encounter: Payer: Self-pay | Admitting: Internal Medicine

## 2012-05-20 VITALS — BP 110/62 | HR 104 | Temp 99.1°F | Wt 182.0 lb

## 2012-05-20 DIAGNOSIS — R062 Wheezing: Secondary | ICD-10-CM

## 2012-05-20 DIAGNOSIS — J449 Chronic obstructive pulmonary disease, unspecified: Secondary | ICD-10-CM

## 2012-05-20 DIAGNOSIS — J40 Bronchitis, not specified as acute or chronic: Secondary | ICD-10-CM

## 2012-05-20 MED ORDER — CEFTRIAXONE SODIUM 1 G IJ SOLR
1.0000 g | Freq: Once | INTRAMUSCULAR | Status: AC
Start: 2012-05-20 — End: 2012-05-20
  Administered 2012-05-20: 1 g via INTRAMUSCULAR

## 2012-05-20 NOTE — Progress Notes (Signed)
Patient aware.

## 2012-05-20 NOTE — Patient Instructions (Addendum)
Take Levaquin 500 mg daily for 10 days, use albuterol inhaler 2 sprays  Four times daily, use Advair inhaler twice daily. Take 12 day Prednisone taper.

## 2012-06-05 ENCOUNTER — Encounter: Payer: Self-pay | Admitting: Internal Medicine

## 2012-06-05 NOTE — Progress Notes (Signed)
  Subjective:    Patient ID: Summer Williams, female    DOB: 09-29-1945, 67 y.o.   MRN: [id number]  HPI 67 year old white female with history of COPD in today with acute respiratory infection. Has had coughing congestion. Some discolored sputum production. No documented fever. O2 sat is 88-89% on room air. Some shortness of breath. She does have an albuterol inhaler and Advair inhaler but I don't think she's been using these. No shaking chills. Sounds nasally congested.    Review of Systems     Objective:   Physical Exam HEENT exam: Pharynx slightly injected. TMs are clear. Neck is supple without adenopathy. Chest bilateral inspiratory wheezing. No rales.        Assessment & Plan:  COPD  Bronchitis  Plan: Sterapred DS 10 mg 12 day dosepak. Albuterol inhaler 2 sprays by mouth 4 times a day. Advair inhaler 250/50 one spray by mouth every 12 hours. Levaquin 500 milligrams daily for 10 days. Call if not better in 24-48 hours or sooner if worse. Chest x-ray shows hyperaeration and no infiltrate.

## 2012-06-30 ENCOUNTER — Institutional Professional Consult (permissible substitution): Payer: PRIVATE HEALTH INSURANCE | Admitting: Internal Medicine

## 2012-07-14 ENCOUNTER — Other Ambulatory Visit: Payer: Self-pay | Admitting: Internal Medicine

## 2012-07-21 ENCOUNTER — Ambulatory Visit (INDEPENDENT_AMBULATORY_CARE_PROVIDER_SITE_OTHER): Payer: Medicare Other | Admitting: Internal Medicine

## 2012-07-21 ENCOUNTER — Institutional Professional Consult (permissible substitution): Payer: PRIVATE HEALTH INSURANCE | Admitting: Internal Medicine

## 2012-07-21 ENCOUNTER — Encounter: Payer: Self-pay | Admitting: Internal Medicine

## 2012-07-21 ENCOUNTER — Other Ambulatory Visit: Payer: Medicare Other

## 2012-07-21 VITALS — BP 122/80 | HR 85 | Ht 64.5 in | Wt 189.0 lb

## 2012-07-21 DIAGNOSIS — J449 Chronic obstructive pulmonary disease, unspecified: Secondary | ICD-10-CM

## 2012-07-21 MED ORDER — TIOTROPIUM BROMIDE MONOHYDRATE 18 MCG IN CAPS: 18.0000 ug | ORAL_CAPSULE | Freq: Every day | RESPIRATORY_TRACT | Status: DC

## 2012-07-21 NOTE — Progress Notes (Signed)
07/21/12- 67 F former smoker-Self referral-COPD(pt states dx'd few years ago at [facility]) She complains of dyspnea on exertion, up and down stairs and relieved by rest. Episode of bronchitis at Christmas with shortness of breath. Had done pulmonary rehabilitation. She paces herself. She may wheeze a little. Cough at times, productive of white sputum with no chest pain, blood or fever. Little variation in exercise tolerance from day to day. Breathing does not wake her. Neither Advair nor pro air were much help. She denies history of heart disease, seasonal allergy or asthma. Past history of a pneumonia. Denies anemia, glaucoma or peripheral edema. 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 CXR12/19/13- IMPRESSION:  No pneumonia. Peribronchial thickening most likely represents  bronchitis.  Original Report Authenticated By: Dwyane Dee, M.D.  Prior to Admission medications   Medication Sig Start Date End Date Taking? Authorizing Provider  amitriptyline (ELAVIL) 50 MG tablet Take 50 mg by mouth at bedtime.     Yes Historical Provider, MD  atorvastatin (LIPITOR) 40 MG tablet TAKE 1 TABLET BY MOUTH EVERY DAY 07/14/12  Yes Margaree Mackintosh, MD  Cholecalciferol (VITAMIN D) 2000 UNITS CAPS Take by mouth.     Yes Historical Provider, MD  citalopram (CELEXA) 20 MG tablet Take 20 mg by mouth daily.     Yes Historical Provider, MD  clonazePAM (KLONOPIN) 0.5 MG tablet Take 0.5 mg by mouth 2 (two) times daily as needed.     Yes Historical Provider, MD  Fluticasone-Salmeterol (ADVAIR) 250-50 MCG/DOSE AEPB Inhale 1 puff into the lungs every 12 (twelve) hours.     Yes Historical Provider, MD  OLANZapine (ZYPREXA) 7.5 MG tablet Take 7.5 mg by mouth at bedtime.     Yes Historical Provider, MD  PROAIR HFA 108 (90 BASE) MCG/ACT inhaler Inhale 2 puffs into the lungs 4 (four) times daily as needed. 07/14/12  Yes Historical Provider, MD  Multiple Vitamin  (MULTIVITAMIN) tablet Take 1 tablet by mouth daily.      Historical Provider, MD  tiotropium (SPIRIVA) 18 MCG inhalation capsule Place 1 capsule (18 mcg total) into inhaler and inhale daily. 07/21/12   Waymon Budge, MD   Past Medical History  Diagnosis Date  . Schizoaffective disorder   . History of alcohol abuse   . Hyperlipidemia   . Vitamin D deficiency   . COPD (chronic obstructive pulmonary disease)    Past Surgical History  Procedure Laterality Date  . Cholecystectomy    . Eye surgery  2005    Cataract OD   Family History  Problem Relation Age of Onset  . Heart disease Father   . Cancer Maternal Grandfather   . Cancer Paternal Grandfather    History   Social History  . Marital Status: Divorced    Spouse Name: N/A    Number of Children: N/A  . Years of Education: N/A   Occupational History  . Not on file.   Social History Main Topics  . Smoking status: Former Smoker -- 1.00 packs/day for 50 years    Types: Cigarettes    Quit date: 04/10/2006  . Smokeless tobacco: Never Used  . Alcohol Use: No  . Drug Use: No  . Sexually Active: Not on file   Other Topics Concern  . Not on file   Social History Narrative  . No narrative on file   ROS-see HPI Constitutional:   No-   weight loss, night sweats,  fevers, chills, fatigue, lassitude. HEENT:   No-  headaches, difficulty swallowing, tooth/dental problems, sore throat,       No-  sneezing, itching, ear ache, nasal congestion, post nasal drip,  CV:  No-   chest pain, orthopnea, PND, swelling in lower extremities, anasarca,                                  dizziness, palpitations Resp: +  shortness of breath with exertion or at rest.              +  productive cough,  No non-productive cough,  No- coughing up of blood.              No-   change in color of mucus.  No- wheezing.   Skin: No-   rash or lesions. GI:  No-   heartburn, indigestion, abdominal pain, nausea, vomiting, diarrhea,                 change in  bowel habits, loss of appetite GU: No-   dysuria, change in color of urine, no urgency or frequency.  No- flank pain. MS:  No-   joint pain or swelling.  No- decreased range of motion.  No- back pain. Neuro-     nothing unusual Psych:  No- change in mood or affect. No depression or anxiety.  No memory loss.  OBJ- Physical Exam General- Alert, Oriented, Affect-appropriate, Distress- none acute Skin- rash-none, lesions- none, excoriation- none Lymphadenopathy- none Head- atraumatic            Eyes- Gross vision intact, PERRLA, conjunctivae and secretions clear            Ears- Hearing, canals-normal            Nose- Clear, no-Septal dev, mucus, polyps, erosion, perforation             Throat- Mallampati II , mucosa clear , drainage- none, tonsils- atrophic Neck- flexible , trachea midline, no stridor , thyroid nl, carotid no bruit Chest - symmetrical excursion , unlabored           Heart/CV- RRR , no murmur , no gallop  , no rub, nl s1 s2                           - JVD- none , edema- none, stasis changes- none, varices- none           Lung- +diminished but clear to P&A, wheeze- none, +dry cough , dullness-none, rub- none           Chest wall-  Abd- tender-no, distended-no, bowel sounds-present, HSM- no Br/ Gen/ Rectal- Not done, not indicated Extrem- cyanosis- none, clubbing, none, atrophy- none, strength- nl Neuro- grossly intact to observation

## 2012-07-21 NOTE — Patient Instructions (Addendum)
Order- lab- a1AT assay  Order- Schedule PFT    Dx COPD                             6 MWT  Sample Spiriva  Try 1 daily

## 2012-07-22 LAB — ALPHA-1-ANTITRYPSIN: A-1 Antitrypsin, Ser: 158 mg/dL (ref 90–200)

## 2012-07-22 NOTE — Assessment & Plan Note (Signed)
COPD is the most likely explanation for her dyspnea with exertion. A component of deconditioning may be present. Plan-lab: Alpha I antitrypsin, PFT, 6 minute walk. Sample Spiriva

## 2012-08-02 NOTE — Progress Notes (Signed)
Quick Note:  LMTCB ______ 

## 2012-08-03 ENCOUNTER — Institutional Professional Consult (permissible substitution): Payer: PRIVATE HEALTH INSURANCE | Admitting: Internal Medicine

## 2012-08-05 NOTE — Progress Notes (Signed)
Quick Note:  Pt aware of results. ______ 

## 2012-08-30 ENCOUNTER — Ambulatory Visit (INDEPENDENT_AMBULATORY_CARE_PROVIDER_SITE_OTHER): Payer: BC Managed Care – PPO | Admitting: Internal Medicine

## 2012-08-30 ENCOUNTER — Ambulatory Visit (INDEPENDENT_AMBULATORY_CARE_PROVIDER_SITE_OTHER): Payer: Medicare Other | Admitting: Internal Medicine

## 2012-08-30 ENCOUNTER — Encounter: Payer: Self-pay | Admitting: Internal Medicine

## 2012-08-30 VITALS — BP 140/78 | HR 99 | Ht 63.0 in | Wt 186.0 lb

## 2012-08-30 DIAGNOSIS — J449 Chronic obstructive pulmonary disease, unspecified: Secondary | ICD-10-CM

## 2012-08-30 DIAGNOSIS — R0600 Dyspnea, unspecified: Secondary | ICD-10-CM

## 2012-08-30 DIAGNOSIS — J4489 Other specified chronic obstructive pulmonary disease: Secondary | ICD-10-CM

## 2012-08-30 DIAGNOSIS — R0609 Other forms of dyspnea: Secondary | ICD-10-CM

## 2012-08-30 DIAGNOSIS — R0989 Other specified symptoms and signs involving the circulatory and respiratory systems: Secondary | ICD-10-CM

## 2012-08-30 LAB — PULMONARY FUNCTION TEST

## 2012-08-30 MED ORDER — BUDESONIDE-FORMOTEROL FUMARATE 80-4.5 MCG/ACT IN AERO: 2.0000 | INHALATION_SPRAY | Freq: Two times a day (BID) | RESPIRATORY_TRACT | Status: DC

## 2012-08-30 MED ORDER — LEVALBUTEROL HCL 0.63 MG/3ML IN NEBU
0.6300 mg | INHALATION_SOLUTION | Freq: Once | RESPIRATORY_TRACT | Status: AC
  Administered 2012-08-30: 0.63 mg via RESPIRATORY_TRACT

## 2012-08-30 NOTE — Progress Notes (Signed)
PFT done today. 

## 2012-08-30 NOTE — Patient Instructions (Addendum)
Order- Overnight oximetry on room air    Dx COPD  Sample Symbicort 80   2 puffs then rinse, twice every day.     You can still use the rescue albuterol HFA inhaler 2 puffs, up to 4 x daily IF Needed  Neb xop 0.63

## 2012-08-30 NOTE — Progress Notes (Signed)
07/21/12- 67 F former smoker-Self referral-COPD(pt states dx'd few years ago at [facility]) She complains of dyspnea on exertion, up and down stairs and relieved by rest. Episode of bronchitis at Christmas with shortness of breath. Had done pulmonary rehabilitation. She paces herself. She may wheeze a little. Cough at times, productive of white sputum with no chest pain, blood or fever. Little variation in exercise tolerance from day to day. Breathing does not wake her. Neither Advair nor pro air were much help. She denies history of heart disease, seasonal allergy or asthma. Past history of a pneumonia. Denies anemia, glaucoma or peripheral edema. 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 CXR12/19/13- IMPRESSION:  No pneumonia. Peribronchial thickening most likely represents  bronchitis.  Original Report Authenticated By: Dwyane Dee, M.D.  08/30/12- 67 F former smoker-Self referral-COPD(pt states dx'd few years ago at [facility]) FOLLOWS FOR: review PFT and with patient. Denies change since last here. Spiriva sample had no effect. Little cough. She had done pulmonary rehabilitation at [facility]. They let her go to make room in the program for others. a1AT normal 158 no phenotype 6 minute walk test 08/30/2012-92%, 87%, 94%, 297 m. Significant desaturation with exercise. PFT: 08/30/2012- severe obstructive airways disease with response to bronchodilator, air trapping, diffusion severely reduced. FVC 1.96/71%, FEV1 0.89/45%, FEV1/FVC 0.45. RV 134%, DLCO 47%.  ROS-see HPI Constitutional:   No-   weight loss, night sweats, fevers, chills, fatigue, lassitude. HEENT:   No-  headaches, difficulty swallowing, tooth/dental problems, sore throat,       No-  sneezing, itching, ear ache, nasal congestion, post nasal drip,  CV:  No-   chest pain, orthopnea, PND, swelling in lower extremities, anasarca,                                   dizziness, palpitations Resp: +  shortness of breath with exertion or at rest.              No-  productive cough,  No non-productive cough,  No- coughing up of blood.              No-   change in color of mucus.  No- wheezing.   Skin: No-   rash or lesions. GI:  No-   heartburn, indigestion, abdominal pain, nausea, vomiting,  GU:  MS:  No-   joint pain or swelling.   Neuro-     nothing unusual Psych:  No- change in mood or affect. No depression or anxiety.  No memory loss.  OBJ- Physical Exam General- Alert, Oriented, Affect-appropriate, Distress- none acute Skin- rash-none, lesions- none, excoriation- none Lymphadenopathy- none Head- atraumatic            Eyes- Gross vision intact, PERRLA, conjunctivae and secretions clear            Ears- Hearing, canals-normal            Nose- Clear, no-Septal dev, mucus, polyps, erosion, perforation             Throat- Mallampati II , mucosa clear , drainage- none, tonsils- atrophic Neck- flexible , trachea midline, no stridor , thyroid nl, carotid no bruit Chest - symmetrical excursion , unlabored           Heart/CV- RRR , no murmur , no gallop  , no rub, nl s1 s2                           -  JVD- none , edema- none, stasis changes- none, varices- none           Lung- +diminished, few crackles, unlabored, wheeze- none, +dry cough , dullness-none, rub- none           Chest wall-  Abd-  Br/ Gen/ Rectal- Not done, not indicated Extrem- cyanosis- none, clubbing, none, atrophy- none, strength- nl Neuro- grossly intact to observation

## 2012-09-04 NOTE — Assessment & Plan Note (Signed)
She has severe obstructive airways disease with a small reactive component. Says mostly emphysema. We discussed stamina exercise she could do herself. Desaturation with exercise indicates she is coming into the range for home O2. Plan-overnight oximetry. Consider home oxygen.

## 2012-09-05 NOTE — Progress Notes (Signed)
Documentation for 6 minute walk

## 2012-09-07 ENCOUNTER — Telehealth: Payer: Self-pay | Admitting: Internal Medicine

## 2012-09-07 DIAGNOSIS — J449 Chronic obstructive pulmonary disease, unspecified: Secondary | ICD-10-CM

## 2012-09-10 NOTE — Telephone Encounter (Signed)
Called the patient-she is aware of her ONO results and that she qualifies for O2 at night- I have placed order to our PCC's. Pt is also aware that someone from a DME company will contact her to set this up-DME will be based on who her insurance company will allow her to use. Nothing more needed at this time. Will sign off on message.

## 2012-10-07 ENCOUNTER — Telehealth: Payer: Self-pay | Admitting: Internal Medicine

## 2012-10-07 DIAGNOSIS — R0600 Dyspnea, unspecified: Secondary | ICD-10-CM

## 2012-10-07 DIAGNOSIS — J449 Chronic obstructive pulmonary disease, unspecified: Secondary | ICD-10-CM

## 2012-10-07 NOTE — Telephone Encounter (Signed)
Spoke with patient-states that she has not heard from DME company in regards to her O2 @ night.  States she got a call from [place] stating that the DME should be calling her to set up the O2. Patient states that she has not heard from anyone about "who" her DME company is going to be and also "when" the O2 at night is going to be set.   Please advise Florentina Addison if this has been completed yet, thanks.

## 2012-10-07 NOTE — Telephone Encounter (Signed)
Order for ONO has been placed.  Patient aware.

## 2012-10-07 NOTE — Telephone Encounter (Signed)
Spoke with Rhonda-will look into this.

## 2012-10-07 NOTE — Telephone Encounter (Signed)
ONO on RA order faxed to [facility] to arrange to repeat ASAP. Rhonda J Cobb

## 2012-10-07 NOTE — Telephone Encounter (Signed)
ONO on RA will need to be repeated. Original ONO was done on 09/01/12, therefore, o2 order expired on 10/01/12.  [facility] is the DME company, so I will need an order placed for ONO on RA then if patient qualifies for o2, a referral will need to be sent to the [facility] so we can fax the order to [facility]. Roderic Palau Cobb  Due to insurance reasons the test will need to be repeated, please contact patient and make patient aware and send order to pcc for ONO on RA to [facility]. Thanks. Rhonda J Cobb

## 2012-10-21 ENCOUNTER — Encounter: Payer: MEDICARE | Admitting: Internal Medicine

## 2012-10-21 ENCOUNTER — Other Ambulatory Visit: Payer: Self-pay | Admitting: Internal Medicine

## 2012-10-21 DIAGNOSIS — J449 Chronic obstructive pulmonary disease, unspecified: Secondary | ICD-10-CM

## 2012-11-01 ENCOUNTER — Other Ambulatory Visit: Payer: Self-pay | Admitting: Internal Medicine

## 2012-11-02 ENCOUNTER — Ambulatory Visit (INDEPENDENT_AMBULATORY_CARE_PROVIDER_SITE_OTHER): Payer: MEDICARE | Admitting: Internal Medicine

## 2012-11-02 ENCOUNTER — Encounter: Payer: Self-pay | Admitting: Internal Medicine

## 2012-11-02 VITALS — BP 128/78 | HR 76 | Ht 65.0 in | Wt 180.0 lb

## 2012-11-02 DIAGNOSIS — R748 Abnormal levels of other serum enzymes: Secondary | ICD-10-CM

## 2012-11-02 DIAGNOSIS — Z79899 Other long term (current) drug therapy: Secondary | ICD-10-CM

## 2012-11-02 DIAGNOSIS — K7689 Other specified diseases of liver: Secondary | ICD-10-CM

## 2012-11-02 DIAGNOSIS — Z0000 Encounter for general adult medical examination without abnormal findings: Secondary | ICD-10-CM

## 2012-11-02 DIAGNOSIS — F259 Schizoaffective disorder, unspecified: Secondary | ICD-10-CM

## 2012-11-02 DIAGNOSIS — Z8709 Personal history of other diseases of the respiratory system: Secondary | ICD-10-CM

## 2012-11-02 DIAGNOSIS — K760 Fatty (change of) liver, not elsewhere classified: Secondary | ICD-10-CM

## 2012-11-02 DIAGNOSIS — M7061 Trochanteric bursitis, right hip: Secondary | ICD-10-CM

## 2012-11-02 DIAGNOSIS — E785 Hyperlipidemia, unspecified: Secondary | ICD-10-CM

## 2012-11-02 DIAGNOSIS — M76899 Other specified enthesopathies of unspecified lower limb, excluding foot: Secondary | ICD-10-CM

## 2012-11-02 LAB — CBC WITH DIFFERENTIAL/PLATELET
Basophils Absolute: 0 10*3/uL (ref 0.0–0.1)
Eosinophils Relative: 1 % (ref 0–5)
Hemoglobin: 13.9 g/dL (ref 12.0–15.0)
MCV: 89.7 fL (ref 78.0–100.0)
Monocytes Relative: 18 % — ABNORMAL HIGH (ref 3–12)
Neutro Abs: 2.7 10*3/uL (ref 1.7–7.7)
RBC: 4.48 MIL/uL (ref 3.87–5.11)
RDW: 13.5 % (ref 11.5–15.5)

## 2012-11-02 LAB — POCT URINALYSIS DIPSTICK
Ketones, UA: NEGATIVE
Leukocytes, UA: NEGATIVE
Nitrite, UA: NEGATIVE
Protein, UA: NEGATIVE
Urobilinogen, UA: NEGATIVE

## 2012-11-02 NOTE — Progress Notes (Signed)
Subjective:    Patient ID: Summer Williams, female    DOB: [birth year], 67 y.o.   MRN: [id number]  HPI  67 year old White Female for health maintenance and evaluation of medical problems. History of schizoaffective disorder treated by Dr. Nolen Mu. This is long-standing. History of COPD treated by Dr. Fannie Knee. History of hyperlipidemia on statin therapy. History of elevated liver enzymes with workup showing fatty liver. Statin therapy not thought to be the cause of elevated liver enzymes. Upper abdominal ultrasound done November 2012 showed mild diffuse fatty liver. Gallbladder had been surgically removed. Not motivated to diet and exercise. History of vitamin D deficiency. Patient currently not taking inhalers. Says they don't help her.  History of alcohol abuse in the remote past. Cholecystectomy done 2003 in [place]. Cataract extraction right eye by Dr. Dagoberto Ligas 2005. Tetanus vaccine April 2011. Pneumovax immunization April 2011. Has been going to pulmonary rehabilitation classes. Is on home oxygen at night.  Refuses mammogram.  Has never had colonoscopy or flexible sigmoidoscopy and refuses.  Refuses breast and pelvic exam.  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 She smoked up to a pack of cigarettes daily for many years but quit smoking in early 2007 after having smoked for 50 years. Last alcoholic drink was in 1980.  Family history: Mother died of complications of dementia. Father died at 41 of cardiac arrest. She says father had history of epilepsy. Grandmother died of suicide. Both grandfathers with history of cancer.  No known drug allergies.  Social history: She is divorced. Until just recently, resided with her mother. Mother just passed away of complications of dementia. She was in her 90s. Mother had a fall, was hospitalized, had failure to thrive and subsequently was placed in hospice therapy. Son and daughter-in-law  resides here in [place].    Review of Systems  Constitutional: Positive for fatigue.  HENT: Negative.   Eyes: Negative.   Respiratory:       History of COPD  Cardiovascular: Negative.   Gastrointestinal:       History of elevated liver functions thought to be due to fatty liver  Endocrine: Negative.   Genitourinary: Negative.   Musculoskeletal:       Complaining of right hip pain over her trochanter  Allergic/Immunologic: Negative.   Hematological: Negative.   Psychiatric/Behavioral:       Dysphoric mood       Objective:   Physical Exam  Vitals reviewed. Constitutional: She is oriented to person, place, and time. She appears well-developed and well-nourished. No distress.  HENT:  Head: Normocephalic and atraumatic.  Right Ear: External ear normal.  Left Ear: External ear normal.  Mouth/Throat: Oropharynx is clear and moist.  Eyes: Conjunctivae and EOM are normal. Pupils are equal, round, and reactive to light. Right eye exhibits no discharge. Left eye exhibits no discharge.  Neck: Neck supple. No JVD present. No thyromegaly present.  Pulmonary/Chest: Effort normal and breath sounds normal. No respiratory distress. She has no wheezes. She has no rales.  Patient refuses breast exam  Abdominal: Soft. Bowel sounds are normal. She exhibits no distension and no mass. There is no tenderness. There is no rebound and no guarding.  Genitourinary:  Patient refuses  Musculoskeletal: Normal range of motion. She exhibits no edema.  Lymphadenopathy:    She has no cervical adenopathy.  Neurological: She is alert and oriented to person, place, and time. She has normal reflexes. No cranial nerve deficit.  Coordination normal.  Skin: Skin is warm and dry. No rash noted. She is not diaphoretic. No erythema.  Psychiatric: She has a normal mood and affect. Her behavior is normal. Judgment and thought content normal.          Assessment & Plan:  Right leg pain-thought to be  trochanteric bursitis.  History of schizoaffective disorder  Hyperlipidemia- treated with statin  COPD  History of elevated liver enzymes  History of vitamin D deficiency  Plan: May try Celebrex 200 mg daily for 2 weeks. If persists, needs to see orthopedist.    Subjective:   Patient presents for Medicare Annual/Subsequent preventive examination.   Review Past Medical/Family/Social:   Risk Factors  Current exercise habits: walks and mows  lawn- but mostly sedentary Dietary issues discussed: advise low fat low carb diet  Cardiac risk factors:  Depression Screen  (Note: if answer to either of the following is "Yes", a more complete depression screening is indicated)   Over the past two weeks, have you felt down, depressed or hopeless? No  Over the past two weeks, have you felt little interest or pleasure in doing things? No Have you lost interest or pleasure in daily life? No Do you often feel hopeless? No Do you cry easily over simple problems? No   Activities of Daily Living  In your present state of health, do you have any difficulty performing the following activities?:   Driving? No  Managing money? No  Feeding yourself? No  Getting from bed to chair? No  Climbing a flight of stairs? No  Preparing food and eating?: No  Bathing or showering? No  Getting dressed: No  Getting to the toilet? No  Using the toilet:No  Moving around from place to place: No  In the past year have you fallen or had a near fall?:No  Are you sexually active? No  Do you have more than one partner? No   Hearing Difficulties: No  Do you often ask people to speak up or repeat themselves? No  Do you experience ringing or noises in your ears? No  Do you have difficulty understanding soft or whispered voices? No  Do you feel that you have a problem with memory? No Do you often misplace items? No    Home Safety:  Do you have a smoke alarm at your residence? Yes Do you have grab bars  in the bathroom?no Do you have throw rugs in your house? yes   Cognitive Testing  Alert? Yes Normal Appearance?Yes  Oriented to person? Yes Place? Yes  Time? Yes  Recall of three objects? Yes  Can perform simple calculations? Yes  Displays appropriate judgment?Yes  Can read the correct time from a watch face?Yes   List the Names of Other Physician/Practitioners you currently use:  See referral list for the physicians patient is currently seeing. Dr. Bettey Mare;  Dr. Gailen Shelter    Review of Systems: see above   Objective:     General appearance: Appears stated age and mildly obese  Head: Normocephalic, without obvious abnormality, atraumatic  Eyes: conj clear, EOMi PEERLA  Ears: normal TM's and external ear canals both ears  Nose: Nares normal. Septum midline. Mucosa normal. No drainage or sinus tenderness.  Throat: lips, mucosa, and tongue normal; teeth and gums normal  Neck: no adenopathy, no carotid bruit, no JVD, supple, symmetrical, trachea midline and thyroid not enlarged, symmetric, no tenderness/mass/nodules  No CVA tenderness.  Lungs: clear to auscultation bilaterally  Breasts: normal appearance,  no masses or tenderness. Heart: regular rate and rhythm, S1, S2 normal, no murmur, click, rub or gallop  Abdomen: soft, non-tender; bowel sounds normal; no masses, no organomegaly  Musculoskeletal: ROM normal in all joints, no crepitus, no deformity, Normal muscle strengthen. Back  is symmetric, no curvature. Skin: Skin color, texture, turgor normal. No rashes or lesions  Lymph nodes: Cervical, supraclavicular, and axillary nodes normal.  Neurologic: CN 2 -12 Normal, Normal symmetric reflexes. Normal coordination and gait  Psych: Alert & Oriented x 3, Mood appear stable.    Assessment:    Annual wellness medicare exam   Plan:    During the course of the visit the patient was educated and counseled about appropriate screening and preventive services  including:  Reminded about mammogram 3 hemoccult cards given- colonoscopy declined Declines Zostzvax      Patient Instructions (the written plan) was given to the patient.  Medicare Attestation  I have personally reviewed:  The patient's medical and social history  Their use of alcohol, tobacco or illicit drugs  Their current medications and supplements  The patient's functional ability including ADLs,fall risks, home safety risks, cognitive, and hearing and visual impairment  Diet and physical activities  Evidence for depression or mood disorders  The patient's weight, height, BMI, and visual acuity have been recorded in the chart. I have made referrals, counseling, and provided education to the patient based on review of the above and I have provided the patient with a written personalized care plan for preventive services.

## 2012-11-03 LAB — LIPID PANEL
HDL: 55 mg/dL (ref >39)
LDL Cholesterol: 73 mg/dL (ref 0–99)
Total CHOL/HDL Ratio: 2.8 Ratio
Triglycerides: 134 mg/dL (ref <150)

## 2012-11-03 LAB — COMPREHENSIVE METABOLIC PANEL
ALT: 34 U/L (ref 0–35)
AST: 39 U/L — ABNORMAL HIGH (ref 0–37)
BUN: 10 mg/dL (ref 6–23)
CO2: 28 mEq/L (ref 19–32)
Creat: 0.79 mg/dL (ref 0.50–1.10)
Potassium: 4.2 mEq/L (ref 3.5–5.3)
Sodium: 143 mEq/L (ref 135–145)
Total Bilirubin: 0.7 mg/dL (ref 0.3–1.2)
Total Protein: 6.9 g/dL (ref 6.0–8.3)

## 2012-11-03 LAB — TSH: TSH: 1.874 u[IU]/mL (ref 0.350–4.500)

## 2012-11-28 ENCOUNTER — Encounter: Payer: Self-pay | Admitting: Internal Medicine

## 2012-11-28 NOTE — Patient Instructions (Addendum)
Continue same medications and return in 6 months 

## 2012-11-29 ENCOUNTER — Ambulatory Visit (INDEPENDENT_AMBULATORY_CARE_PROVIDER_SITE_OTHER): Payer: Medicare Other | Admitting: Internal Medicine

## 2012-11-29 ENCOUNTER — Encounter: Payer: Self-pay | Admitting: Internal Medicine

## 2012-11-29 VITALS — BP 130/70 | HR 90 | Ht 63.0 in | Wt 181.6 lb

## 2012-11-29 DIAGNOSIS — J449 Chronic obstructive pulmonary disease, unspecified: Secondary | ICD-10-CM

## 2012-11-29 NOTE — Progress Notes (Signed)
07/21/12- 67 F former smoker-Self referral-COPD(pt states dx'd few years ago at [facility]) She complains of dyspnea on exertion, up and down stairs and relieved by rest. Episode of bronchitis at Christmas with shortness of breath. Had done pulmonary rehabilitation. She paces herself. She may wheeze a little. Cough at times, productive of white sputum with no chest pain, blood or fever. Little variation in exercise tolerance from day to day. Breathing does not wake her. Neither Advair nor pro air were much help. She denies history of heart disease, seasonal allergy or asthma. Past history of a pneumonia. Denies anemia, glaucoma or peripheral edema. 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 CXR12/19/13- IMPRESSION:  No pneumonia. Peribronchial thickening most likely represents  bronchitis.  Original Report Authenticated By: Dwyane Dee, M.D.  08/30/12- 67 F former smoker-Self referral-COPD(pt states dx'd few years ago at [facility]) FOLLOWS FOR: review PFT and with patient. Denies change since last here. Spiriva sample had no effect. Little cough. She had done pulmonary rehabilitation at [facility]. They let her go to make room in the program for others. a1AT normal 158 no phenotype 6 minute walk test 08/30/2012-92%, 87%, 94%, 297 m. Significant desaturation with exercise. PFT: 08/30/2012- severe obstructive airways disease with response to bronchodilator, air trapping, diffusion severely reduced. FVC 1.96/71%, FEV1 0.89/45%, FEV1/FVC 0.45. RV 134%, DLCO 47%.  11/29/12- 67 F former smoker-Self referral-COPD(pt states dx'd few years ago at [facility] FOLLOWS FOR: chooses not to use inhalers-cant tell that they are helping; Continues to stay SOB-worsened with activity; wheezing, slight cough(non productive recently), denies any chest congestion. O2 2l/ [facility] for sleep- using regularly since ONOX Saw no effect limited trial Spiriva or  Symbicort  Had done Pulm Rehab. Educated exercise, progression of COPD.  ROS-see HPI Constitutional:   No-   weight loss, night sweats, fevers, chills, fatigue, lassitude. HEENT:   No-  headaches, difficulty swallowing, tooth/dental problems, sore throat,       No-  sneezing, itching, ear ache, nasal congestion, post nasal drip,  CV:  No-   chest pain, orthopnea, PND, swelling in lower extremities, anasarca,                                  dizziness, palpitations Resp: +  shortness of breath with exertion or at rest.              No-  productive cough,  No non-productive cough,  No- coughing up of blood.              No-   change in color of mucus.  No- wheezing.   Skin: No-   rash or lesions. GI:  No-   heartburn, indigestion, abdominal pain, nausea, vomiting,  GU:  MS:  No-   joint pain or swelling.   Neuro-     nothing unusual Psych:  No- change in mood or affect. No depression or anxiety.  No memory loss.   OBJ- Physical Exam General- Alert, Oriented, Affect-appropriate, Distress- none acute Skin- rash-none, lesions- none, excoriation- none Lymphadenopathy- none Head- atraumatic            Eyes- Gross vision intact, PERRLA, conjunctivae and secretions clear            Ears- Hearing, canals-normal            Nose- Clear, no-Septal dev, mucus,  polyps, erosion, perforation             Throat- Mallampati II , mucosa clear , drainage- none, tonsils- atrophic Neck- flexible , trachea midline, no stridor , thyroid nl, carotid no bruit Chest - symmetrical excursion , unlabored           Heart/CV- RRR , no murmur , no gallop  , no rub, nl s1 s2                           - JVD- none , edema- none, stasis changes- none, varices- none           Lung- +diminished, clear, unlabored, wheeze- none, No- cough , dullness-none, rub- none           Chest wall-  Abd-  Br/ Gen/ Rectal- Not done, not indicated Extrem- cyanosis- none, clubbing, none, atrophy- none, strength- nl Neuro- grossly  intact to observation

## 2012-11-29 NOTE — Patient Instructions (Addendum)
Ask the "Y" for information about the Silver Sneakers program. Swimming is also great.  It would be ok to finish off the Symbicort sample we gave you   2 puffs then rinse mouth, twice daily. Use it till its gone, then stop and notice if there is any difference in your breathing.

## 2012-11-29 NOTE — Assessment & Plan Note (Signed)
Encouraged exercise- Silver Sneakers at [employer] disease progression and long-term expectations She will use up left over Symbicort - then decide if nay benefit.

## 2013-02-06 ENCOUNTER — Other Ambulatory Visit: Payer: Self-pay | Admitting: Internal Medicine

## 2013-03-16 ENCOUNTER — Other Ambulatory Visit: Payer: Self-pay | Admitting: *Deleted

## 2013-03-16 MED ORDER — ATORVASTATIN CALCIUM 40 MG PO TABS: 40.0000 mg | ORAL_TABLET | Freq: Every day | ORAL | Status: DC

## 2013-05-10 ENCOUNTER — Ambulatory Visit (INDEPENDENT_AMBULATORY_CARE_PROVIDER_SITE_OTHER): Payer: MEDICARE | Admitting: Internal Medicine

## 2013-05-10 ENCOUNTER — Encounter: Payer: Self-pay | Admitting: Internal Medicine

## 2013-05-10 VITALS — BP 128/84 | HR 84 | Temp 97.9°F | Ht 64.0 in | Wt 182.0 lb

## 2013-05-10 DIAGNOSIS — Z23 Encounter for immunization: Secondary | ICD-10-CM

## 2013-05-10 DIAGNOSIS — E785 Hyperlipidemia, unspecified: Secondary | ICD-10-CM

## 2013-05-10 DIAGNOSIS — Z79899 Other long term (current) drug therapy: Secondary | ICD-10-CM

## 2013-05-10 LAB — LIPID PANEL
HDL: 65 mg/dL (ref >39)
LDL Cholesterol: 84 mg/dL (ref 0–99)
Triglycerides: 136 mg/dL (ref <150)
VLDL: 27 mg/dL (ref 0–40)

## 2013-05-10 LAB — HEPATIC FUNCTION PANEL
Albumin: 4.5 g/dL (ref 3.5–5.2)
Alkaline Phosphatase: 105 U/L (ref 39–117)
Indirect Bilirubin: 0.5 mg/dL (ref 0.0–0.9)
Total Bilirubin: 0.7 mg/dL (ref 0.3–1.2)
Total Protein: 7.4 g/dL (ref 6.0–8.3)

## 2013-05-10 NOTE — Patient Instructions (Signed)
Return in 6 months. Flu vaccine given. Continue same medication. Lab studies drawn today.

## 2013-05-10 NOTE — Progress Notes (Signed)
   Subjective:    Patient ID: Summer Williams, female    DOB: 11/08/1945, 67 y.o.   MRN: [id number]  HPI Six-month followup today on hyperlipidemia and other medical issues. Since last visit, her mother has passed away. She was in her 48s and had lived with Zakia for over 30 years. Just seems to be adjusting well living alone. Says she doesn't mind it sometimes gets depressed thinking about her mother. Will be spending the holidays with her son and his wife. She had influenza vaccine today. Agrees to get mammogram and bone density study. Doesn't want to have colonoscopy.    Review of Systems     Objective:   Physical Exam no JVD thyromegaly or carotid bruits. Chest clear to auscultation. Cardiac exam regular rate and rhythm normal S1 and S2. Extremities without edema        Assessment & Plan:  Hyperlipidemia  History of schizoaffective disorder  History of elevated liver enzymes thought to be due to fatty liver and/or medications  Plan: Fasting lipid panel and liver functions drawn today. Return in 6 months. Flu vaccine given. Order given for mammogram and bone density study.

## 2013-05-31 ENCOUNTER — Ambulatory Visit (INDEPENDENT_AMBULATORY_CARE_PROVIDER_SITE_OTHER)
Admission: RE | Admit: 2013-05-31 | Discharge: 2013-05-31 | Disposition: A | Discharge status: Home / Self Care | Payer: MEDICARE | Source: Ambulatory Visit | Attending: Internal Medicine | Admitting: Internal Medicine

## 2013-05-31 ENCOUNTER — Ambulatory Visit (INDEPENDENT_AMBULATORY_CARE_PROVIDER_SITE_OTHER): Payer: MEDICARE | Admitting: Internal Medicine

## 2013-05-31 VITALS — BP 122/76 | HR 76 | Ht 63.0 in | Wt 188.4 lb

## 2013-05-31 DIAGNOSIS — J449 Chronic obstructive pulmonary disease, unspecified: Secondary | ICD-10-CM

## 2013-05-31 NOTE — Progress Notes (Signed)
07/21/12- 67 F former smoker-Self referral-COPD(pt states dx'd few years ago at [facility]) She complains of dyspnea on exertion, up and down stairs and relieved by rest. Episode of bronchitis at Christmas with shortness of breath. Had done pulmonary rehabilitation. She paces herself. She may wheeze a little. Cough at times, productive of white sputum with no chest pain, blood or fever. Little variation in exercise tolerance from day to day. Breathing does not wake her. Neither Advair nor pro air were much help. She denies history of heart disease, seasonal allergy or asthma. Past history of a pneumonia. Denies anemia, glaucoma or peripheral edema. 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 CXR12/19/13- IMPRESSION:  No pneumonia. Peribronchial thickening most likely represents  bronchitis.  Original Report Authenticated By: Dwyane Dee, M.D.  08/30/12- 67 F former smoker-Self referral-COPD(pt states dx'd few years ago at [facility]) FOLLOWS FOR: review PFT and with patient. Denies change since last here. Spiriva sample had no effect. Little cough. She had done pulmonary rehabilitation at [facility]. They let her go to make room in the program for others. a1AT normal 158 no phenotype 6 minute walk test 08/30/2012-92%, 87%, 94%, 297 m. Significant desaturation with exercise. PFT: 08/30/2012- severe obstructive airways disease with response to bronchodilator, air trapping, diffusion severely reduced. FVC 1.96/71%, FEV1 0.89/45%, FEV1/FVC 0.45. RV 134%, DLCO 47%.  11/29/12- 67 F former smoker-Self referral-COPD(pt states dx'd few years ago at [facility] FOLLOWS FOR: chooses not to use inhalers-cant tell that they are helping; Continues to stay SOB-worsened with activity; wheezing, slight cough(non productive recently), denies any chest congestion. O2 2l/ APS for sleep- using regularly since ONOX Saw no effect limited trial Spiriva or  Symbicort  Had done Pulm Rehab. Educated exercise, progression of COPD.  05/31/13- 67 F former smoker-Self referral-COPD/ severe FOLLOWS FOR: Pt states that she feels her SOB has gotten worse since last; has to stop more often and rest. Sleeps with oxygen 2 L/ APS Has done pulmonary rehabilitation. Denies chest pain, swelling. Scant phlegm is clear.  ROS-see HPI Constitutional:   No-   weight loss, night sweats, fevers, chills, fatigue, lassitude. HEENT:   No-  headaches, difficulty swallowing, tooth/dental problems, sore throat,       No-  sneezing, itching, ear ache, nasal congestion, post nasal drip,  CV:  No-   chest pain, orthopnea, PND, swelling in lower extremities, anasarca, dizziness, palpitations Resp: +  shortness of breath with exertion or at rest.              No-  productive cough,  + non-productive cough,  No- coughing up of blood.              No-   change in color of mucus.  No- wheezing.   Skin: No-   rash or lesions. GI:  No-   heartburn, indigestion, abdominal pain, nausea, vomiting,  GU:  MS:  No-   joint pain or swelling.   Neuro-     nothing unusual Psych:  No- change in mood or affect. No depression or anxiety.  No memory loss.   OBJ- Physical Exam General- Alert, Oriented, Affect-appropriate, Distress- none acute, overweight Skin- rash-none, lesions- none, excoriation- none Lymphadenopathy- none Head- atraumatic            Eyes- Gross vision intact, PERRLA, conjunctivae and secretions clear            Ears- Hearing, canals-normal  Nose- Clear, no-Septal dev, mucus, polyps, erosion, perforation             Throat- Mallampati II , mucosa clear , drainage- none, tonsils- atrophic, + throat clearing Neck- flexible , trachea midline, no stridor , thyroid nl, carotid no bruit Chest - symmetrical excursion , unlabored           Heart/CV- RRR , no murmur , no gallop  , no rub, nl s1 s2                           - JVD- none , edema- none, stasis changes-  none, varices- none           Lung- +diminished, clear, unlabored, wheeze- none, No- cough , dullness-none, rub- none           Chest wall-  Abd-  Br/ Gen/ Rectal- Not done, not indicated Extrem- cyanosis- none, clubbing, none, atrophy- none, strength- nl Neuro- grossly intact to observation

## 2013-05-31 NOTE — Patient Instructions (Signed)
Order- CXR   Dx COPD  Sample Anoro inhaler for trial    1 puff, once daily  I do agree that if you could loose 5-10 lbs, you would see some easier breathing.

## 2013-06-02 DIAGNOSIS — Z923 Personal history of irradiation: Secondary | ICD-10-CM

## 2013-06-02 HISTORY — PX: BREAST LUMPECTOMY: SHX2

## 2013-06-02 HISTORY — DX: Personal history of irradiation: Z92.3

## 2013-06-22 ENCOUNTER — Encounter: Payer: Self-pay | Admitting: Internal Medicine

## 2013-06-22 NOTE — Assessment & Plan Note (Signed)
No distinct acute event but she is more short of breath at least with a cold air and indoor heat of the winter season. Plan-chest x-ray, sample of Anoro inhaler

## 2013-09-29 ENCOUNTER — Encounter (INDEPENDENT_AMBULATORY_CARE_PROVIDER_SITE_OTHER): Payer: Self-pay

## 2013-09-29 ENCOUNTER — Ambulatory Visit (INDEPENDENT_AMBULATORY_CARE_PROVIDER_SITE_OTHER): Payer: Medicare HMO | Admitting: Internal Medicine

## 2013-09-29 ENCOUNTER — Encounter: Payer: Self-pay | Admitting: Internal Medicine

## 2013-09-29 VITALS — BP 126/84 | HR 91 | Ht 63.0 in | Wt 182.0 lb

## 2013-09-29 DIAGNOSIS — J449 Chronic obstructive pulmonary disease, unspecified: Secondary | ICD-10-CM

## 2013-09-29 MED ORDER — BUDESONIDE-FORMOTEROL FUMARATE 160-4.5 MCG/ACT IN AERO: 2.0000 | INHALATION_SPRAY | Freq: Two times a day (BID) | RESPIRATORY_TRACT | Status: DC

## 2013-09-29 NOTE — Progress Notes (Signed)
07/21/12- 67 F former smoker-Self referral-COPD(pt states dx'd few years ago at [facility]) She complains of dyspnea on exertion, up and down stairs and relieved by rest. Episode of bronchitis at Christmas with shortness of breath. Had done pulmonary rehabilitation. She paces herself. She may wheeze a little. Cough at times, productive of white sputum with no chest pain, blood or fever. Little variation in exercise tolerance from day to day. Breathing does not wake her. Neither Advair nor pro air were much help. She denies history of heart disease, seasonal allergy or asthma. Past history of a pneumonia. Denies anemia, glaucoma or peripheral edema. 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 CXR12/19/13- IMPRESSION:  No pneumonia. Peribronchial thickening most likely represents  bronchitis.  Original Report Authenticated By: Ivar Drape, M.D.  08/30/12- 67 F former smoker-Self referral-COPD(pt states dx'd few years ago at [facility]) [place]: review PFT and 6MW with patient. Denies change since last here. Spiriva sample had no effect. Little cough. She had done pulmonary rehabilitation at [facility]. They let her go to make room in the program for others. a1AT normal 158 no phenotype 6 minute walk test 08/30/2012-92%, 87%, 94%, 297 m. Significant desaturation with exercise. PFT: 08/30/2012- severe obstructive airways disease with response to bronchodilator, air trapping, diffusion severely reduced. FVC 1.96/71%, FEV1 0.89/45%, FEV1/FVC 0.45. RV 134%, DLCO 47%.  11/29/12- 67 F former smoker-Self referral-COPD(pt states dx'd few years ago at [facility]: chooses not to use inhalers-cant tell that they are helping; Continues to stay SOB-worsened with activity; wheezing, slight cough(non productive recently), denies any chest congestion. O2 2l/ APS for sleep- using regularly since ONOX Saw no effect limited trial Spiriva or  Symbicort  Had done Pulm Rehab. Educated exercise, progression of COPD.  05/31/13- 67 F former smoker-Self referral-COPD/ severe FOLLOWS FOR: Pt states that she feels her SOB has gotten worse since last; has to stop more often and rest. Sleeps with oxygen 2 L/ APS Has done pulmonary rehabilitation. Denies chest pain, swelling. Scant phlegm is clear.  09/29/13- 68 F former smoker-Self referral-COPD/ severe FOLLOWS FOR:  Still having sob with exertion and having to stop and rest often Sleeps with oxygen 2 L/ APS No specific spring seasonal problems. She tried a sample as Symbicort but was using it only one puff one time daily and couldn't tell that it helped. CXR 05/31/14 IMPRESSION:  1. Lung hyperexpansion and bronchitic change without acute  cardiopulmonary disease.  2. Sequela of prior granulomatous infection as above.  Electronically Signed  By: Sandi Mariscal M.D.  On: 05/31/2013 10:38   ROS-see HPI Constitutional:   No-   weight loss, night sweats, fevers, chills, fatigue, lassitude. HEENT:   No-  headaches, difficulty swallowing, tooth/dental problems, sore throat,       No-  sneezing, itching, ear ache, nasal congestion, post nasal drip,  CV:  No-   chest pain, orthopnea, PND, swelling in lower extremities, anasarca, dizziness, palpitations Resp: +  shortness of breath with exertion or at rest.              No-  productive cough,  + non-productive cough,  No- coughing up of blood.              No-   change in color of mucus.  No- wheezing.   Skin: No-   rash or lesions. GI:  No-   heartburn, indigestion, abdominal pain, nausea, vomiting,  GU:  MS:  No-   joint pain or swelling.   Neuro-     nothing unusual Psych:  No- change in mood or affect. No depression or anxiety.  No memory loss.  OBJ- Physical Exam General- Alert, Oriented, Affect-appropriate, Distress- none acute, overweight Skin- rash-none, lesions- none, excoriation- none Lymphadenopathy- none Head- atraumatic             Eyes- Gross vision intact, PERRLA, conjunctivae and secretions clear            Ears- Hearing, canals-normal            Nose- Clear, no-Septal dev, mucus, polyps, erosion, perforation             Throat- Mallampati II , mucosa clear , drainage- none, tonsils- atrophic, + throat clearing Neck- flexible , trachea midline, no stridor , thyroid nl, carotid no bruit Chest - symmetrical excursion , unlabored           Heart/CV- RRR , no murmur , no gallop  , no rub, nl s1 s2                           - JVD- none , edema- none, stasis changes- none, varices- none           Lung- +diminished, clear, unlabored, wheeze- none, No- cough , dullness-none, rub- none           Chest wall-  Abd-  Br/ Gen/ Rectal- Not done, not indicated Extrem- cyanosis- none, clubbing, none, atrophy- none, strength- nl Neuro- grossly intact to observation

## 2013-09-29 NOTE — Patient Instructions (Signed)
Sample Symbicort 160 inhaler    2 puffs, then rinse mouth, twice every day.    See if regular use of Symbicort improves your shortness of breath with activity.  Please call as needed

## 2013-10-05 ENCOUNTER — Other Ambulatory Visit: Payer: Self-pay | Admitting: Internal Medicine

## 2013-10-05 ENCOUNTER — Telehealth: Payer: Self-pay | Admitting: Internal Medicine

## 2013-10-05 NOTE — Telephone Encounter (Signed)
Daliresp and theophylline are in the same family and are not usually given together. Suggest she try theophylline 200 mg, # 60,  1 twice daily with food.

## 2013-10-05 NOTE — Telephone Encounter (Signed)
Called and spoke with pt and she stated that she feels that the symbicort is not helping her breathing.  She is wanting to know if she can try daliresp and theophylline.  She stated that she has a friend that is on these meds and they have helped her so much.  Pt wanted to see what CY recs are.  CY please advise. Thanks  Last ov--09/29/2013 Next ov-  9./30/2015  No Known Allergies   Current Outpatient Prescriptions on File Prior to Visit  Medication Sig Dispense Refill  . amitriptyline (ELAVIL) 50 MG tablet Take 25 mg by mouth at bedtime.       Marland Kitchen atorvastatin (LIPITOR) 40 MG tablet Take 1 tablet (40 mg total) by mouth daily.  90 tablet  1  . budesonide-formoterol (SYMBICORT) 160-4.5 MCG/ACT inhaler Inhale 2 puffs into the lungs 2 (two) times daily.  1 Inhaler  0  . Cholecalciferol (VITAMIN D) 2000 UNITS CAPS Take by mouth.        . citalopram (CELEXA) 20 MG tablet Take 20 mg by mouth daily.        . clonazePAM (KLONOPIN) 0.5 MG tablet Take 0.5 mg by mouth 2 (two) times daily as needed.        Marland Kitchen OLANZapine (ZYPREXA) 7.5 MG tablet Take 7.5 mg by mouth at bedtime.         No current facility-administered medications on file prior to visit.

## 2013-10-05 NOTE — Telephone Encounter (Signed)
LMOMTCB x 1 

## 2013-10-06 NOTE — Telephone Encounter (Signed)
lmomtcb x 2  

## 2013-10-07 MED ORDER — THEOPHYLLINE ER 200 MG PO TB12: 200.0000 mg | ORAL_TABLET | Freq: Two times a day (BID) | ORAL | Status: DC

## 2013-10-07 NOTE — Telephone Encounter (Signed)
Pt returning call.Summer Williams ° °

## 2013-10-07 NOTE — Telephone Encounter (Signed)
lmomtcb x3 on VM

## 2013-10-07 NOTE — Telephone Encounter (Signed)
Pt aware of recs per [facility]. Theophylline 200mg  #60 take 1 po BID sent to [facility].   Nothing further needed.

## 2013-10-30 NOTE — Assessment & Plan Note (Signed)
Probably some gradual worsening over time as expected. Plan-try another sample as Symbicort 160, emphasizing that it be used 2 puffs twice daily to see if it affects her exertional dyspnea. Discussed exercise for stamina

## 2013-11-11 ENCOUNTER — Ambulatory Visit (INDEPENDENT_AMBULATORY_CARE_PROVIDER_SITE_OTHER): Payer: Medicare HMO | Admitting: Internal Medicine

## 2013-11-11 ENCOUNTER — Encounter: Payer: Self-pay | Admitting: Internal Medicine

## 2013-11-11 VITALS — BP 132/76 | HR 80 | Temp 97.9°F | Ht 63.0 in | Wt 172.5 lb

## 2013-11-11 DIAGNOSIS — Z0000 Encounter for general adult medical examination without abnormal findings: Secondary | ICD-10-CM

## 2013-11-11 DIAGNOSIS — K760 Fatty (change of) liver, not elsewhere classified: Secondary | ICD-10-CM

## 2013-11-11 DIAGNOSIS — Z1329 Encounter for screening for other suspected endocrine disorder: Secondary | ICD-10-CM

## 2013-11-11 DIAGNOSIS — E785 Hyperlipidemia, unspecified: Secondary | ICD-10-CM

## 2013-11-11 DIAGNOSIS — F259 Schizoaffective disorder, unspecified: Secondary | ICD-10-CM

## 2013-11-11 DIAGNOSIS — Z1239 Encounter for other screening for malignant neoplasm of breast: Secondary | ICD-10-CM

## 2013-11-11 DIAGNOSIS — Z8639 Personal history of other endocrine, nutritional and metabolic disease: Secondary | ICD-10-CM

## 2013-11-11 DIAGNOSIS — K7689 Other specified diseases of liver: Secondary | ICD-10-CM

## 2013-11-11 DIAGNOSIS — Z8709 Personal history of other diseases of the respiratory system: Secondary | ICD-10-CM

## 2013-11-11 DIAGNOSIS — Z130 Encounter for screening for diseases of the blood and blood-forming organs and certain disorders involving the immune mechanism: Secondary | ICD-10-CM

## 2013-11-11 LAB — CBC WITH DIFFERENTIAL/PLATELET
BASOS ABS: 0 10*3/uL (ref 0.0–0.1)
BASOS PCT: 0 % (ref 0–1)
Eosinophils Absolute: 0.1 10*3/uL (ref 0.0–0.7)
Eosinophils Relative: 1 % (ref 0–5)
HCT: 41.9 % (ref 36.0–46.0)
Hemoglobin: 14.3 g/dL (ref 12.0–15.0)
LYMPHS PCT: 41 % (ref 12–46)
Lymphs Abs: 2.6 10*3/uL (ref 0.7–4.0)
MCH: 30.6 pg (ref 26.0–34.0)
MCHC: 34.1 g/dL (ref 30.0–36.0)
MCV: 89.7 fL (ref 78.0–100.0)
MONO ABS: 1.3 10*3/uL — AB (ref 0.1–1.0)
Monocytes Relative: 20 % — ABNORMAL HIGH (ref 3–12)
Neutro Abs: 2.4 10*3/uL (ref 1.7–7.7)
Neutrophils Relative %: 38 % — ABNORMAL LOW (ref 43–77)
PLATELETS: 140 10*3/uL — AB (ref 150–400)
RBC: 4.67 MIL/uL (ref 3.87–5.11)
RDW: 14.1 % (ref 11.5–15.5)
WBC: 6.4 10*3/uL (ref 4.0–10.5)

## 2013-11-11 LAB — POCT URINALYSIS DIPSTICK
Bilirubin, UA: NEGATIVE
Blood, UA: NEGATIVE
Glucose, UA: NEGATIVE
Ketones, UA: NEGATIVE
Leukocytes, UA: NEGATIVE
Nitrite, UA: NEGATIVE
PH UA: 6
PROTEIN UA: NEGATIVE
Spec Grav, UA: 1.02
UROBILINOGEN UA: NEGATIVE

## 2013-11-11 NOTE — Progress Notes (Signed)
Subjective:    Patient ID: Summer Williams, female    DOB: 08/28/1945, 68 y.o.   MRN: [id number]  HPI  68 year old white female for health maintenance and they wish no medical issues. History of schizoaffective disorder treated by Dr. Caprice Williams which is long-standing. History of COPD treated by Dr. Annamaria Williams. History of hyperlipidemia on statin therapy. History of elevated liver enzymes with workup showing fatty liver. Statin therapy not thought to be the cause of elevated liver enzymes. She had an upper abdominal ultrasound done November 2012 showing mild diffuse fatty liver. She is obese. Gallbladder has been removed surgically. She's not really motivated to diet and exercise. History of vitamin D deficiency.  History of alcohol abuse in the remote past. Cholecystectomy done 2003 in [place]. Cataract extraction right eye by Dr. Kathrin Williams in 2005.  Uses home oxygen at night. Has prescription for inhalers.  Tetanus immunization April 2011. Pneumovax immunization April 2011.  Refuses mammogram and Pap smear.  Refuses colonoscopy or flexible sigmoidoscopy. Refuses breast exam.  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 She smoked up to a pack of cigarettes daily for many years but quit smoking in early 2007 after smoking for 50 years. Last alcoholic drink was in 4081. Patient is divorced. Previously resided with her mother until her mother died in 32. Mother had a fall, was hospitalized and had failure to thrive and was placed in hospice therapy. Patient son and daughter-in-law resides here in [place]  Mother died of complications of dementia in her 64s. Father died at age 12 of cardiac arrest. Patient says father had history of epilepsy. Grandmother died of suicide. Both grandfathers with history of cancer.  No known drug allergies.    Review of Systems  Constitutional: Negative.   HENT: Negative.   Eyes: Negative.   Respiratory:        History of COPD  Cardiovascular: Negative.   Gastrointestinal: Negative.   Endocrine: Negative.   Allergic/Immunologic: Negative.   Neurological: Negative.   Psychiatric/Behavioral:       Dysphoric mood       Objective:   Physical Exam  Vitals reviewed. Constitutional: She is oriented to person, place, and time. She appears well-developed and well-nourished. No distress.  HENT:  Head: Normocephalic and atraumatic.  Right Ear: External ear normal.  Left Ear: External ear normal.  Mouth/Throat: Oropharynx is clear and moist. No oropharyngeal exudate.  Eyes: Conjunctivae and EOM are normal. Pupils are equal, round, and reactive to light. Right eye exhibits no discharge. Left eye exhibits no discharge. No scleral icterus.  Neck: Neck supple. No JVD present. No thyromegaly present.  Cardiovascular: Normal rate, regular rhythm and intact distal pulses.   No murmur heard. Pulmonary/Chest: Effort normal and breath sounds normal. No respiratory distress. She has no wheezes. She has no rales.  Breast exam declined  Abdominal: Soft. Bowel sounds are normal. She exhibits no distension and no mass. There is no tenderness. There is no rebound and no guarding.  Genitourinary:  Pap and pelvic declined  Musculoskeletal: She exhibits no edema.  Lymphadenopathy:    She has no cervical adenopathy.  Neurological: She is alert and oriented to person, place, and time. She has normal reflexes. She displays normal reflexes. No cranial nerve deficit. Coordination normal.  Skin: Skin is warm and dry. No rash noted. She is not diaphoretic.  Psychiatric: She has a normal mood and affect. Her behavior is normal. Judgment and thought content normal.  Affect is a bit flat          Assessment & Plan:  COPD  History of schizoaffective disorder  History of elevated liver enzymes secondary to fatty liver  History of vitamin D deficiency  Hyperlipidemia on statin therapy  Plan: Continue  current medications and return in next months for office visit lipid panel liver functions in followup of hyperlipidemia   Subjective:   Patient presents for Medicare Annual/Subsequent preventive examination.  Review Past Medical/Family/Social: see above   Risk Factors  Current exercise habits: sedentary Dietary issues discussed:low fat low carb   Cardiac risk factors: hyperlipidemia  Depression Screen  (Note: if answer to either of the following is "Yes", a more complete depression screening is indicated)   Over the past two weeks, have you felt down, depressed or hopeless? No  Over the past two weeks, have you felt little interest or pleasure in doing things? No Have you lost interest or pleasure in daily life? No Do you often feel hopeless? No Do you cry easily over simple problems? No   Activities of Daily Living  In your present state of health, do you have any difficulty performing the following activities?:   Driving? No  Managing money? No  Feeding yourself? No  Getting from bed to chair? No  Climbing a flight of stairs? sometimes Preparing food and eating?: No  Bathing or showering? No  Getting dressed: No  Getting to the toilet? No  Using the toilet:No  Moving around from place to place: No  In the past year have you fallen or had a near fall?:No  Are you sexually active? No  Do you have more than one partner? No   Hearing Difficulties: No  Do you often ask people to speak up or repeat themselves? No  Do you experience ringing or noises in your ears? No  Do you have difficulty understanding soft or whispered voices? No  Do you feel that you have a problem with memory? No Do you often misplace items? No    Home Safety:  Do you have a smoke alarm at your residence? Yes Do you have grab bars in the bathroom? no Do you have throw rugs in your house? yes   Cognitive Testing  Alert? Yes Normal Appearance?Yes  Oriented to person? Yes Place? Yes  Time? Yes   Recall of three objects? Yes  Can perform simple calculations? Yes  Displays appropriate judgment?Yes  Can read the correct time from a watch face?Yes   List the Names of Other Physician/Practitioners you currently use:  See referral list for the physicians patient is currently seeing.  [facility], Dr. Keturah Barre   Review of Systems: see above   Objective:     General appearance: Appears stated age and mildly obese  Head: Normocephalic, without obvious abnormality, atraumatic  Eyes: conj clear, EOMi PEERLA  Ears: normal TM's and external ear canals both ears  Nose: Nares normal. Septum midline. Mucosa normal. No drainage or sinus tenderness.  Throat: lips, mucosa, and tongue normal; teeth and gums normal  Neck: no adenopathy, no carotid bruit, no JVD, supple, symmetrical, trachea midline and thyroid not enlarged, symmetric, no tenderness/mass/nodules  No CVA tenderness.  Lungs: clear to auscultation bilaterally  Breasts: normal appearance, no masses or tenderness Heart: regular rate and rhythm, S1, S2 normal, no murmur, click, rub or gallop  Abdomen: soft, non-tender; bowel sounds normal; no masses, no organomegaly  Musculoskeletal: ROM normal in all joints, no crepitus, no deformity, Normal  muscle strengthen. Back  is symmetric, no curvature. Skin: Skin color, texture, turgor normal. No rashes or lesions  Lymph nodes: Cervical, supraclavicular, and axillary nodes normal.  Neurologic: CN 2 -12 Normal, Normal symmetric reflexes. Normal coordination and gait  Psych: Alert & Oriented x 3, Mood appear stable.    Assessment:    Annual wellness medicare exam   Plan:    During the course of the visit the patient was educated and counseled about appropriate screening and preventive services including:  Mammogram ordered Colonoscopy declined. Three hemoccult cards given.      Patient Instructions (the written plan) was given to the patient.  Medicare Attestation  I have  personally reviewed:  The patient's medical and social history  Their use of alcohol, tobacco or illicit drugs  Their current medications and supplements  The patient's functional ability including ADLs,fall risks, home safety risks, cognitive, and hearing and visual impairment  Diet and physical activities  Evidence for depression or mood disorders  The patient's weight, height, BMI, and visual acuity have been recorded in the chart. I have made referrals, counseling, and provided education to the patient based on review of the above and I have provided the patient with a written personalized care plan for preventive services.

## 2013-11-12 LAB — COMPREHENSIVE METABOLIC PANEL
ALT: 15 U/L (ref 0–35)
AST: 20 U/L (ref 0–37)
Albumin: 4.6 g/dL (ref 3.5–5.2)
Alkaline Phosphatase: 105 U/L (ref 39–117)
BUN: 11 mg/dL (ref 6–23)
CALCIUM: 9.5 mg/dL (ref 8.4–10.5)
CHLORIDE: 104 meq/L (ref 96–112)
CO2: 28 mEq/L (ref 19–32)
Creat: 0.82 mg/dL (ref 0.50–1.10)
Glucose, Bld: 98 mg/dL (ref 70–99)
Potassium: 3.9 mEq/L (ref 3.5–5.3)
Sodium: 142 mEq/L (ref 135–145)
Total Bilirubin: 0.9 mg/dL (ref 0.2–1.2)
Total Protein: 7.5 g/dL (ref 6.0–8.3)

## 2013-11-12 LAB — LIPID PANEL
Cholesterol: 146 mg/dL (ref 0–200)
HDL: 54 mg/dL (ref >39)
LDL Cholesterol: 64 mg/dL (ref 0–99)
Total CHOL/HDL Ratio: 2.7 Ratio
Triglycerides: 142 mg/dL (ref <150)
VLDL: 28 mg/dL (ref 0–40)

## 2013-11-12 LAB — TSH: TSH: 3.216 u[IU]/mL (ref 0.350–4.500)

## 2013-11-29 NOTE — Patient Instructions (Signed)
Continue current medications and return in 6 months. Patient declines mammogram, colonoscopy, Pap smear

## 2013-12-13 ENCOUNTER — Ambulatory Visit
Admission: RE | Admit: 2013-12-13 | Discharge: 2013-12-13 | Disposition: A | Discharge status: Home / Self Care | Payer: Medicare HMO | Source: Ambulatory Visit | Attending: Internal Medicine | Admitting: Internal Medicine

## 2013-12-13 DIAGNOSIS — Z1239 Encounter for other screening for malignant neoplasm of breast: Secondary | ICD-10-CM

## 2013-12-14 ENCOUNTER — Other Ambulatory Visit: Payer: Self-pay | Admitting: Internal Medicine

## 2013-12-14 DIAGNOSIS — R928 Other abnormal and inconclusive findings on diagnostic imaging of breast: Secondary | ICD-10-CM

## 2013-12-21 ENCOUNTER — Ambulatory Visit
Admission: RE | Admit: 2013-12-21 | Discharge: 2013-12-21 | Disposition: A | Discharge status: Home / Self Care | Payer: Medicare HMO | Source: Ambulatory Visit | Attending: Internal Medicine | Admitting: Internal Medicine

## 2013-12-21 ENCOUNTER — Other Ambulatory Visit: Payer: Self-pay | Admitting: Internal Medicine

## 2013-12-21 DIAGNOSIS — R928 Other abnormal and inconclusive findings on diagnostic imaging of breast: Secondary | ICD-10-CM

## 2014-01-05 ENCOUNTER — Ambulatory Visit
Admission: RE | Admit: 2014-01-05 | Discharge: 2014-01-05 | Disposition: A | Discharge status: Home / Self Care | Payer: Medicare HMO | Source: Ambulatory Visit | Attending: Internal Medicine | Admitting: Internal Medicine

## 2014-01-05 ENCOUNTER — Other Ambulatory Visit: Payer: Self-pay | Admitting: Internal Medicine

## 2014-01-05 ENCOUNTER — Encounter (INDEPENDENT_AMBULATORY_CARE_PROVIDER_SITE_OTHER): Payer: Self-pay

## 2014-01-05 DIAGNOSIS — R928 Other abnormal and inconclusive findings on diagnostic imaging of breast: Secondary | ICD-10-CM

## 2014-01-05 DIAGNOSIS — C50919 Malignant neoplasm of unspecified site of unspecified female breast: Secondary | ICD-10-CM

## 2014-01-05 HISTORY — DX: Malignant neoplasm of unspecified site of unspecified female breast: C50.919

## 2014-01-06 ENCOUNTER — Other Ambulatory Visit: Payer: Self-pay | Admitting: Internal Medicine

## 2014-01-06 ENCOUNTER — Ambulatory Visit
Admission: RE | Admit: 2014-01-06 | Discharge: 2014-01-06 | Disposition: A | Discharge status: Home / Self Care | Payer: Medicare HMO | Source: Ambulatory Visit | Attending: Internal Medicine | Admitting: Internal Medicine

## 2014-01-06 DIAGNOSIS — C50919 Malignant neoplasm of unspecified site of unspecified female breast: Secondary | ICD-10-CM

## 2014-01-09 ENCOUNTER — Telehealth: Payer: Self-pay | Admitting: *Deleted

## 2014-01-09 DIAGNOSIS — C50312 Malignant neoplasm of lower-inner quadrant of left female breast: Secondary | ICD-10-CM

## 2014-01-09 NOTE — Telephone Encounter (Signed)
Confirmed [facility] for 01/18/14 at 12N .  Instructions and contact information given.

## 2014-01-13 ENCOUNTER — Ambulatory Visit
Admission: RE | Admit: 2014-01-13 | Discharge: 2014-01-13 | Disposition: A | Discharge status: Home / Self Care | Payer: Medicare HMO | Source: Ambulatory Visit | Attending: Internal Medicine | Admitting: Internal Medicine

## 2014-01-13 DIAGNOSIS — C50919 Malignant neoplasm of unspecified site of unspecified female breast: Secondary | ICD-10-CM

## 2014-01-13 MED ORDER — GADOBENATE DIMEGLUMINE 529 MG/ML IV SOLN
16.0000 mL | Freq: Once | INTRAVENOUS | Status: AC | PRN
  Administered 2014-01-13: 16 mL via INTRAVENOUS

## 2014-01-15 HISTORY — PX: BREAST BIOPSY: SHX20

## 2014-01-18 ENCOUNTER — Encounter (INDEPENDENT_AMBULATORY_CARE_PROVIDER_SITE_OTHER): Payer: Self-pay | Admitting: Surgery

## 2014-01-18 ENCOUNTER — Other Ambulatory Visit (HOSPITAL_BASED_OUTPATIENT_CLINIC_OR_DEPARTMENT_OTHER): Payer: Medicare HMO

## 2014-01-18 ENCOUNTER — Ambulatory Visit (HOSPITAL_BASED_OUTPATIENT_CLINIC_OR_DEPARTMENT_OTHER): Payer: Medicare HMO | Admitting: Surgery

## 2014-01-18 ENCOUNTER — Ambulatory Visit
Admission: RE | Admit: 2014-01-18 | Discharge: 2014-01-18 | Disposition: A | Discharge status: Home / Self Care | Payer: Medicare HMO | Source: Ambulatory Visit | Attending: Radiation Oncology | Admitting: Radiation Oncology

## 2014-01-18 ENCOUNTER — Ambulatory Visit (HOSPITAL_BASED_OUTPATIENT_CLINIC_OR_DEPARTMENT_OTHER): Payer: Medicare HMO

## 2014-01-18 ENCOUNTER — Ambulatory Visit (HOSPITAL_BASED_OUTPATIENT_CLINIC_OR_DEPARTMENT_OTHER): Payer: Medicare HMO | Admitting: Hematology and Oncology

## 2014-01-18 ENCOUNTER — Encounter: Payer: Self-pay | Admitting: General Practice

## 2014-01-18 ENCOUNTER — Encounter: Payer: Self-pay | Admitting: Hematology and Oncology

## 2014-01-18 VITALS — BP 135/71 | HR 75 | Temp 98.2°F | Resp 20 | Ht 63.0 in | Wt 171.8 lb

## 2014-01-18 DIAGNOSIS — Z170 Estrogen receptor positive status [ER+]: Secondary | ICD-10-CM

## 2014-01-18 DIAGNOSIS — C50319 Malignant neoplasm of lower-inner quadrant of unspecified female breast: Secondary | ICD-10-CM

## 2014-01-18 DIAGNOSIS — C50312 Malignant neoplasm of lower-inner quadrant of left female breast: Secondary | ICD-10-CM

## 2014-01-18 DIAGNOSIS — D696 Thrombocytopenia, unspecified: Secondary | ICD-10-CM

## 2014-01-18 LAB — CBC WITH DIFFERENTIAL/PLATELET
BASO%: 0 % (ref 0.0–2.0)
Basophils Absolute: 0 10*3/uL (ref 0.0–0.1)
EOS%: 0.4 % (ref 0.0–7.0)
Eosinophils Absolute: 0 10*3/uL (ref 0.0–0.5)
HEMATOCRIT: 42.1 % (ref 34.8–46.6)
HGB: 14 g/dL (ref 11.6–15.9)
LYMPH%: 38.8 % (ref 14.0–49.7)
MCH: 30.4 pg (ref 25.1–34.0)
MCHC: 33.3 g/dL (ref 31.5–36.0)
MCV: 91.5 fL (ref 79.5–101.0)
MONO#: 1 10*3/uL — ABNORMAL HIGH (ref 0.1–0.9)
MONO%: 20 % — AB (ref 0.0–14.0)
NEUT#: 2.1 10*3/uL (ref 1.5–6.5)
NEUT%: 40.8 % (ref 38.4–76.8)
PLATELETS: 110 10*3/uL — AB (ref 145–400)
RBC: 4.6 10*6/uL (ref 3.70–5.45)
RDW: 13.6 % (ref 11.2–14.5)
WBC: 5.2 10*3/uL (ref 3.9–10.3)
lymph#: 2 10*3/uL (ref 0.9–3.3)

## 2014-01-18 LAB — COMPREHENSIVE METABOLIC PANEL (CC13)
ALK PHOS: 115 U/L (ref 40–150)
ALT: 12 U/L (ref 0–55)
ANION GAP: 9 meq/L (ref 3–11)
AST: 22 U/L (ref 5–34)
Albumin: 4.2 g/dL (ref 3.5–5.0)
BILIRUBIN TOTAL: 0.81 mg/dL (ref 0.20–1.20)
BUN: 9.7 mg/dL (ref 7.0–26.0)
CO2: 26 mEq/L (ref 22–29)
Calcium: 9.9 mg/dL (ref 8.4–10.4)
Chloride: 107 mEq/L (ref 98–109)
Creatinine: 0.8 mg/dL (ref 0.6–1.1)
GLUCOSE: 98 mg/dL (ref 70–140)
Potassium: 4.2 mEq/L (ref 3.5–5.1)
SODIUM: 142 meq/L (ref 136–145)
TOTAL PROTEIN: 7.8 g/dL (ref 6.4–8.3)

## 2014-01-18 NOTE — Progress Notes (Signed)
[facility] CONSULT NOTE  Patient Care Team: Elby Showers, MD as PCP - General (Internal Medicine) Rulon Eisenmenger, MD as Consulting Physician (Hematology and Oncology) Joyice Faster. Cornett, MD as Consulting Physician (General Surgery) Thea Silversmith, MD as Consulting Physician (Radiation Oncology)  CHIEF COMPLAINTS/PURPOSE OF CONSULTATION:  Newly diagnosed breast cancer  HISTORY OF PRESENTING ILLNESS:  Summer Williams 68 y.o. Caucasian female is here because of recent diagnosis of left breast cancer. Patient has never had a mammogram until recently. 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 She was presented this morning in our multidisciplinary breast tumor board the patient is here today to discuss the treatment plan. She is accompanied by her son she reports no new complaints or concerns. The biopsy site is bruised but she denies any tenderness.  I reviewed her records extensively and collaborated the history with the patient.  SUMMARY OF ONCOLOGIC HISTORY:   Cancer of lower-inner quadrant of left female breast   01/05/2014 Initial Diagnosis Ultrasound-guided core needle biopsy: Cancer of lower-inner quadrant of left female breast, ER 100%, PR 90%, Ki-67: 30%, HER-2 negative (Ratio 1.02), grade 2   01/13/2014 Breast MRI 1.6 cm left breast abnormality, no abnormal lymph nodes.    In terms of breast cancer risk profile:  She menarched at early age of 56 and went to menopause at age 26  She had one pregnancy, her first child was born at age 66  She did not received birth control pills  She was never exposed to fertility medications or hormone replacement therapy.  She  has family history of Breast and GYN cancers  MEDICAL HISTORY:  Past Medical History  Diagnosis Date  . Schizoaffective disorder   . History of alcohol abuse   . Hyperlipidemia   . Vitamin D deficiency   . COPD (chronic obstructive pulmonary disease)   . Depression   . Skin cancer     SURGICAL HISTORY: Past Surgical History  Procedure Laterality Date  . Cholecystectomy    . Eye surgery  2005    Cataract OD    SOCIAL HISTORY: History   Social History  . Marital Status: Divorced    Spouse Name: N/A    Number of Children: N/A  . Years of Education: N/A   Occupational History  . Not on file.   Social History Main Topics  . Smoking status: Former Smoker -- 1.00 packs/day for 50 years    Types: Cigarettes    Quit date: 04/10/2006  . Smokeless tobacco: Never Used  . Alcohol Use: No  . Drug Use: No  . Sexual Activity: Not on file   Other Topics Concern  . Not on file   Social History Narrative  . No narrative on file    FAMILY HISTORY: Family History  Problem Relation Age of Onset  . Heart disease Father   . Cancer Maternal Grandfather   . Lung cancer Maternal Grandfather   . Cancer Paternal Grandfather   . Leukemia Paternal Grandfather   . Ovarian cancer Maternal Aunt   . Breast cancer Maternal Aunt     ALLERGIES:  has No Known Allergies.  MEDICATIONS:  Current Outpatient Prescriptions  Medication Sig Dispense Refill  . atorvastatin (LIPITOR) 40 MG tablet TAKE 1 TABLET BY MOUTH EVERY DAY  90 tablet  1  . Cholecalciferol (VITAMIN D) 2000 UNITS CAPS Take by mouth.        . citalopram (CELEXA) 20 MG tablet Take 20 mg by mouth daily.        Marland Kitchen OLANZapine (ZYPREXA) 7.5 MG tablet Take 7.5 mg by mouth at bedtime.        . theophylline (THEODUR) 200 MG 12 hr tablet Take 1 tablet (200 mg total) by mouth 2 (two) times daily. With food  60 tablet  1  . budesonide-formoterol (SYMBICORT) 160-4.5 MCG/ACT inhaler Inhale 2 puffs into the lungs 2 (two) times daily.  1  Inhaler  0  . clonazePAM (KLONOPIN) 0.5 MG tablet Take 0.5 mg by mouth 2 (two) times daily as needed.         No current facility-administered medications for this visit.    REVIEW OF SYSTEMS:   Constitutional: Denies fevers, chills or abnormal night sweats Eyes: Denies blurriness of vision, double vision or watery eyes Ears, nose, mouth, throat, and face: Denies mucositis or sore throat Respiratory: Denies cough, dyspnea or wheezes Cardiovascular: Denies palpitation, chest discomfort or lower extremity swelling Gastrointestinal:  Denies nausea, heartburn or change in bowel habits Skin: Denies abnormal skin rashes Lymphatics: Denies new lymphadenopathy or easy bruising Neurological:Denies numbness, tingling or new weaknesses Behavioral/Psych: Mood is stable, no new changes  Breast: Bruise from recent biopsy. All other systems were reviewed with the patient and are negative.  PHYSICAL EXAMINATION: ECOG PERFORMANCE STATUS: 0 - Asymptomatic  Filed Vitals:   01/18/14 1253  BP: 135/71  Pulse: 75  Temp: 98.2 F (36.8 C)  Resp: 20   Filed Weights   01/18/14 1253  Weight: 171 lb 12.8 oz (77.928 kg)    GENERAL:alert, no distress and comfortable SKIN: skin color, texture, turgor are normal, no rashes or significant lesions EYES: normal, conjunctiva are pink and non-injected, sclera clear OROPHARYNX:no exudate, no erythema and lips, buccal mucosa, and tongue normal  NECK: supple, thyroid normal size, non-tender, without nodularity LYMPH:  no palpable lymphadenopathy in the cervical, axillary or inguinal LUNGS: clear to auscultation and percussion with normal breathing effort HEART: regular rate & rhythm and no murmurs and no lower extremity edema ABDOMEN:abdomen soft, non-tender and normal bowel sounds Musculoskeletal:no cyanosis of digits and no clubbing  PSYCH: alert & oriented x 3 with fluent speech NEURO: no focal motor/sensory deficits BREAST:  LABORATORY DATA:  I have  reviewed the data as listed Lab Results  Component Value Date   WBC 5.2 01/18/2014   HGB 14.0 01/18/2014   HCT 42.1 01/18/2014   MCV 91.5 01/18/2014   PLT 110* 01/18/2014   Lab Results  Component Value Date   NA 142 01/18/2014   K 4.2 01/18/2014   CL 104 11/11/2013   CO2 26 01/18/2014    RADIOGRAPHIC STUDIES: I have personally reviewed the radiological reports and agreed with the findings in the report.  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 N0 M0 clinical stage IA along with DCIS: I discussed with the patient in great detail with the entire pathology report including the significance of the receptors and their implications on treatment patient and her son asked many questions about the difference between DCIS and invasive ductal carcinoma. I was  able to answer all the questions regarding the pathology.  I recommended that the patient should undergo lumpectomy with sentinel lymph node study. If lymph node were negative then we would send the tissue for Oncotype DX and determine the risk of recurrence to see if she would benefit from chemotherapy. If the sentinel lymph node was positive, patient would need chemotherapy.  I discussed with them that the sequence of events would be surgery followed by chemotherapy if needed or by radiation followed by antiestrogen pills for 5 years. I will discuss the details of risks and benefits of antiestrogen therapy after she completes surgery.  2. Thrombocytopenia: Platelet 110,000. Previous platelet counts were 140-150,000. We can continue to watch and monitor this for diarrhea. She has no symptoms of bleeding. The rest of the labs were reviewed and there were no abnormalities in liver and kidney function her electrolytes.  All questions were answered. The patient knows to call the clinic with any  problems, questions or concerns. I spent 55 minutes counseling the patient face to face. The total time spent in the appointment was 60 minutes and more than 50% was on counseling.     Rulon Eisenmenger, MD 01/18/2014 3:44 PM

## 2014-01-18 NOTE — Assessment & Plan Note (Signed)
Left breast invasive ductal carcinoma 1.6 cm left axillary lymph node biopsy negative ER/PR positive HER-2 negative T1 C. N0 M0 clinical stage IA along with DCIS: I discussed with the patient in great detail with the entire pathology report including the significance of the receptors and their implications on treatment patient and her son asked many questions about the difference between DCIS and invasive ductal carcinoma. I was able to answer all the questions regarding the pathology.  I recommended that the patient should undergo lumpectomy with sentinel lymph node study. If lymph node were negative then we would send the tissue for Oncotype DX and determine the risk of recurrence to see if she would benefit from chemotherapy. If the sentinel lymph node was positive, patient would need chemotherapy.  I discussed with them that the sequence of events would be surgery followed by chemotherapy if needed or by radiation followed by antiestrogen pills for 5 years. I will discuss the details of risks and benefits of antiestrogen therapy after she completes surgery.

## 2014-01-18 NOTE — Progress Notes (Signed)
[facility] Psychosocial Distress Screening Spiritual Care  Visited with pt Bethena Roys and son to introduce [place], and to review distress screen per protocol.  The patient scored a 5 on the Psychosocial Distress Thermometer which indicates moderate distress. Assess for distress and other psychosocial needs.  Per pt, stress is reduced now that she has more info re tx.  Per pt, primary stressor is treatment expenses; she and son plan to f/u with [place], Financial Advocate, and have her card.  Provided spiritual and emotional support.  Family values conversation partner for processing and reflection, and is aware of ongoing chaplain availability.  Spiritual Care follow up needed: No.  ONCBCN DISTRESS SCREENING 01/18/2014  Screening Type Initial Screening  Mark the number that describes how much distress you have been experiencing in the past week 5  Emotional problem type Adjusting to illness  Information Concerns Type Lack of info about treatment  Referral to financial advocate Yes  Referral to support programs Yes  Other [place], [place]

## 2014-01-18 NOTE — Progress Notes (Signed)
Radiation Oncology         [phone]) [phone] ________________________________  Initial outpatient Consultation - Date: 01/18/2014   Name: Summer Williams MRN: [id number]   DOB: Aug 27, 1945  REFERRING PHYSICIAN: Elby Showers, MD  STAGE: Cancer of lower-inner quadrant of left female breast   Primary site: Breast (Left)   Staging method: AJCC 7th Edition   Clinical: Stage IA (T1c, N0, cM0) signed by Rulon Eisenmenger, MD on 01/18/2014  8:41 AM   Summary: Stage IA (T1c, N0, cM0)  HISTORY OF PRESENT ILLNESS::Summer Williams is a 68 y.o. female was found to have a left breast mass on a screening mammogram.  Ultrasound showed this mass to measure 1.2 cm.  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 She was also noted to have a lymph node which was suspicious but biopsy was negative. An MRI was performed which showed a the primary mass in the left breast and no other abnormalities.  She presents today to discuss radiation in the management of her disease. She is GXP1 with her first live birth at 44.  She is post menopausal with her menses at 1. She has no prior history of cancer, radiation or chemotherapy.   PREVIOUS RADIATION THERAPY: No  PAST MEDICAL HISTORY:  has a past medical history of Schizoaffective disorder; History of alcohol abuse; Hyperlipidemia; Vitamin D deficiency; COPD (chronic obstructive pulmonary disease); Depression; and Skin cancer.    PAST SURGICAL HISTORY: Past Surgical History  Procedure Laterality Date  . Cholecystectomy    . Eye surgery  2005    Cataract OD    FAMILY HISTORY:  Family History  Problem Relation Age of Onset  . Heart disease Father   . Cancer Maternal Grandfather   . Lung cancer Maternal Grandfather   . Cancer Paternal Grandfather   . Leukemia Paternal Grandfather   . Ovarian cancer Maternal Aunt   . Breast cancer Maternal Aunt     SOCIAL HISTORY:  History  Substance Use Topics  . Smoking  status: Former Smoker -- 1.00 packs/day for 50 years    Types: Cigarettes    Quit date: 04/10/2006  . Smokeless tobacco: Never Used  . Alcohol Use: No    ALLERGIES: Review of patient's allergies indicates no known allergies.  MEDICATIONS:  Current Outpatient Prescriptions  Medication Sig Dispense Refill  . atorvastatin (LIPITOR) 40 MG tablet TAKE 1 TABLET BY MOUTH EVERY DAY  90 tablet  1  . budesonide-formoterol (SYMBICORT) 160-4.5 MCG/ACT inhaler Inhale 2 puffs into the lungs 2 (two) times daily.  1 Inhaler  0  . Cholecalciferol (VITAMIN D) 2000 UNITS CAPS Take by mouth.        . citalopram (CELEXA) 20 MG tablet Take 20 mg by mouth daily.        . clonazePAM (KLONOPIN) 0.5 MG tablet Take 0.5 mg by mouth 2 (two) times daily as needed.        Marland Kitchen OLANZapine (ZYPREXA) 7.5 MG tablet Take 7.5 mg by mouth at bedtime.        . theophylline (THEODUR) 200 MG 12 hr tablet Take 1 tablet (200 mg total) by mouth 2 (two) times daily. With food  60 tablet  1   No current facility-administered medications for this encounter.    REVIEW OF SYSTEMS:  A 15 point review of systems is documented in the electronic medical record. This was obtained by the nursing staff. However, I reviewed this with  the patient to discuss relevant findings and make appropriate changes.  Pertinent items are noted in HPI.  PHYSICAL EXAM:  She is pleasant, alert and awake.  She has bruising in the medial aspect of the left breast. She has some biopsy change palpable. She has no palpable adenopathy or abnormalities of the right breast. Alert and oriented x 3.   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 MRI requested for surgical planning.  LABS:  BUN and creatinine were obtained on site at [facility] at [address]  Results:  BUN 6 mg/dL, Creatinine 0.7 mg/dL, estimated GFR 83.  EXAM: BILATERAL BREAST MRI WITH AND WITHOUT CONTRAST  TECHNIQUE: Multiplanar, multisequence MR images of both breasts were obtained prior to and following the intravenous administration of 16 ml of MultiHance.  THREE-DIMENSIONAL MR IMAGE RENDERING ON INDEPENDENT WORKSTATION:  Three-dimensional MR images were rendered by post-processing of the original MR data on an independent workstation. The three-dimensional MR images were interpreted, and findings are reported in the following complete MRI report for this study. Three dimensional images were evaluated at the independent DynaCad workstation.  COMPARISON:  Mammography 01/05/2014 (left), 12/21/2013 (left), 12/13/2013 (bilateral). Left breast ultrasound 01/05/2014, 12/21/2013.  FINDINGS: Breast composition: c.  Heterogeneous fibroglandular tissue.  Background parenchymal enhancement: Mild.  Right breast: Numerous tiny enhancing foci throughout the right breast. No mass or suspicious enhancement.  Left breast: Post biopsy changes involving the inner left breast, posterior 1/3. Approximate 1.6 cm enhancing mass with type 1 kinetics with tissue marker clip consistent with the biopsy-proven invasive ductal carcinoma and DCIS. Numerous tiny enhancing foci elsewhere throughout the left breast. No suspicious enhancement elsewhere.  Lymph nodes: No pathologic lymphadenopathy.  Ancillary findings:  None.  IMPRESSION: 1. Approximate 1.6 cm biopsy-proven invasive ductal carcinoma and DCIS involving the inner left breast, posterior 1/3. There are  surrounding post biopsy changes. 2. No evidence of malignancy elsewhere in the left breast. 3. No evidence of malignancy in the right breast. 4. No pathologic lymphadenopathy.  RECOMMENDATION: Treatment plan.  BI-RADS CATEGORY  6: Known biopsy-proven malignancy.   Electronically Signed   By: Evangeline Dakin M.D.   On: 01/13/2014 14:57   Mm Digital Diagnostic Unilat L  01/05/2014   CLINICAL DATA:  Status post ultrasound-guided core needle biopsy suspicious left breast mass  EXAM: DIAGNOSTIC LEFT MAMMOGRAM POST ULTRASOUND BIOPSY  COMPARISON:  Previous exams  FINDINGS: Mammographic images were obtained following ultrasound guided biopsy of left breast mass, 9 o'clock position. Ribbon shaped marking clip in appropriate position  IMPRESSION: Appropriate position ribbon shaped marking clip 9 o'clock position left breast.  Final Assessment: Post Procedure Mammograms for Marker Placement   Electronically Signed   By: Lovey Newcomer M.D.   On: 01/05/2014 14:52   Mm Digital Diagnostic  Unilat L  12/21/2013   CLINICAL DATA:  Patient recalled from screening for left breast mass.  EXAM: DIGITAL DIAGNOSTIC  LEFT MAMMOGRAM WITH CAD  ULTRASOUND LEFT BREAST  COMPARISON:  None.  ACR Breast Density Category b: There are scattered areas of fibroglandular density.  FINDINGS: Spot compression CC and MLO views demonstrate a spiculated 1.3 cm mass within the lower inner left breast posterior depth.  Mammographic images were processed with CAD.  On physical exam, I palpate no discrete mass within the lower inner left breast.  Ultrasound is performed, showing a 1.2 x 0.5 x 1.0 cm irregular spiculated hypoechoic mass within th left breast 9 o'clock position 7 cm from the nipple. Additionally there is a left axillary lymph node with focal eccentric cortical thickening.  IMPRESSION: Suspicious left breast mass.  Focal eccentric thickening of a left axillary lymph node.  RECOMMENDATION: Ultrasound-guided core needle biopsy suspicious left  breast mass and focally thickened left axillary lymph node  Biopsy scheduled for 01/05/2014 at 1 p.m.  I have discussed the findings and recommendations with the patient. Results were also provided in writing at the conclusion of the visit. If applicable, a reminder letter will be sent to the patient regarding the next appointment.  BI-RADS CATEGORY  4: Suspicious.   Electronically Signed   By: Lovey Newcomer M.D.   On: 12/21/2013 14:33   Mm Radiologist Eval And Mgmt  01/06/2014   EXAM: ESTABLISHED PATIENT OFFICE VISIT - LEVEL II  CHIEF COMPLAINT: Recent left breast ultrasound-guided core needle biopsy.  HISTORY OF PRESENT ILLNESS: Left breast mass status post ultrasound-guided core needle biopsy.  EXAM: Biopsy site within the medial left breast is clean and dry without hematoma formation.  PATHOLOGY: Invasive ductal carcinoma and ductal carcinoma in situ.  Benign left axillary lymph node.  ASSESSMENT AND PLAN: ASSESSMENT AND PLAN Surgical consultation for left breast malignancy.  Patient is scheduled for multi disciplinary breasts conference on 01/18/2014. Patient is scheduled for MRI on 01/13/2014 at 11 a.m.   Electronically Signed   By: Lovey Newcomer M.D.   On: 01/06/2014 13:26   US Breast Ltd Uni Left Inc Axilla  12/21/2013   CLINICAL DATA:  Patient recalled from screening for left breast mass.  EXAM: DIGITAL DIAGNOSTIC  LEFT MAMMOGRAM WITH CAD  ULTRASOUND LEFT BREAST  COMPARISON:  None.  ACR Breast Density Category b: There are scattered areas of fibroglandular density.  FINDINGS: Spot compression CC and MLO views demonstrate a spiculated 1.3 cm mass within the lower inner left breast posterior depth.  Mammographic images were processed with CAD.  On physical exam, I palpate no discrete mass within the lower inner left breast.  Ultrasound is performed, showing a 1.2 x 0.5 x 1.0 cm irregular spiculated hypoechoic mass within th left breast 9 o'clock position 7 cm from the nipple. Additionally there is a left  axillary lymph node with focal eccentric cortical thickening.  IMPRESSION: Suspicious left breast mass.  Focal eccentric thickening of a left axillary lymph node.  RECOMMENDATION: Ultrasound-guided core needle biopsy suspicious left breast mass and focally thickened left axillary lymph node  Biopsy scheduled for 01/05/2014 at 1 p.m.  I have discussed the findings and recommendations with the patient. Results were also provided in writing at the conclusion of the visit. If applicable, a reminder letter will be sent to the patient regarding the next appointment.  BI-RADS CATEGORY  4: Suspicious.   Electronically Signed   By: Lovey Newcomer M.D.   On: 12/21/2013 14:33  [place] Lt Breast Bx W Loc Dev 1st Lesion Img Bx Spec US Guide  01/05/2014   CLINICAL DATA:  Suspicious left breast mass  EXAM: ULTRASOUND GUIDED LEFT BREAST CORE NEEDLE BIOPSY  COMPARISON:  Previous exams.  PROCEDURE: I met with the patient and we discussed the procedure of ultrasound-guided biopsy, including benefits and alternatives. We discussed the high likelihood of a successful procedure. We discussed the risks of the procedure including infection, bleeding, tissue injury, clip migration, and inadequate sampling. Informed written consent was given. The usual time-out protocol was performed immediately prior to the procedure.  Using sterile technique and 2% Lidocaine as local anesthetic, under direct ultrasound visualization, a 12 gauge vacuum-assisteddevice was used to perform biopsy of suspicious left breast mass 9 o'clock position using a medial approach. At the conclusion of the procedure, a ribbon shaped tissue marker clip was deployed into the biopsy cavity. Follow-up 2-view mammogram was performed and dictated separately.  IMPRESSION: Ultrasound-guided biopsy of suspicious left breast mass. No apparent complications.   Electronically Signed   By: Lovey Newcomer M.D.   On: 01/05/2014 14:49   [place] Lt Breast Bx W Loc Dev Ea Add Lesion Img Bx Spec US  Guide  01/05/2014   CLINICAL DATA:  Mildly thickened left axillary lymph node.  EXAM: ULTRASOUND GUIDED CORE NEEDLE BIOPSY OF A LEFT AXILLARY NODE  COMPARISON:  Previous exams.  FINDINGS: I met with the patient and we discussed the procedure of ultrasound-guided biopsy, including benefits and alternatives. We discussed the high likelihood of a successful procedure. We discussed the risks of the procedure, including infection, bleeding, tissue injury, clip migration, and inadequate sampling. Informed written consent was given. The usual time-out protocol was performed immediately prior to the procedure.  Using sterile technique and 2% Lidocaine as local anesthetic, under direct ultrasound visualization, a 14 gauge spring-loaded device was used to perform biopsy of mildly thickened left axillary lymph node using a lateral approach.  IMPRESSION: Ultrasound guided biopsy of left axillary lymph node, mildly thickened. No apparent complications.   Electronically Signed   By: Lovey Newcomer M.D.   On: 01/05/2014 14:51      IMPRESSION: T1cN0 Left Breast Cancer  PLAN: I spoke to the patient today regarding her diagnosis and options for treatment. We discussed the equivalence in terms of survival and local failure between mastectomy and breast conservation. We discussed the role of radiation in decreasing local failures in patients who undergo lumpectomy. We discussed the process of simulation and the placement tattoos. We discussed 4-6 weeks of treatment as an outpatient. We discussed the possibility of asymptomatic lung damage. We discussed the low likelihood of secondary malignancies. We discussed the possible side effects including but not limited to skin redness, fatigue, permanent skin darkening, and breast swelling. We discussed the process of simulation and the placement of tattoos. I will see her back after her Oncotype score. I did clarify with her that if she needed chemotherapy this would be performed prior to  radiation. She met with medical oncology as well as a member of our patient family support team. I will plan on seeing her back after her surgery.  I spent 40 minutes face to face with the patient and more than 50% of that time was spent in counseling and/or coordination of care.    ------------------------------------------------  Thea Silversmith, MD

## 2014-01-18 NOTE — Progress Notes (Signed)
Patient ID: Summer Williams, female   DOB: [birth year], 68 y.o.   MRN: [id number]  No chief complaint on file.   HPI Summer Williams is a 68 y.o. female.  Pt sent at the request of Elby Showers, MD for left breast mammographic abnormality core bx shown to be IDC ER PR POS HER 2 NEU NEGATIVE.  Pt denies mass pain or discharge.  f  HPI  Past Medical History  Diagnosis Date  . Schizoaffective disorder   . History of alcohol abuse   . Hyperlipidemia   . Vitamin D deficiency   . COPD (chronic obstructive pulmonary disease)   . Depression   . Skin cancer     Past Surgical History  Procedure Laterality Date  . Cholecystectomy    . Eye surgery  2005    Cataract OD    Family History  Problem Relation Age of Onset  . Heart disease Father   . Cancer Maternal Grandfather   . Lung cancer Maternal Grandfather   . Cancer Paternal Grandfather   . Leukemia Paternal Grandfather   . Ovarian cancer Maternal Aunt   . Breast cancer Maternal Aunt     Social History History  Substance Use Topics  . Smoking status: Former Smoker -- 1.00 packs/day for 50 years    Types: Cigarettes    Quit date: 04/10/2006  . Smokeless tobacco: Never Used  . Alcohol Use: No    No Known Allergies  Current Outpatient Prescriptions  Medication Sig Dispense Refill  . atorvastatin (LIPITOR) 40 MG tablet TAKE 1 TABLET BY MOUTH EVERY DAY  90 tablet  1  . budesonide-formoterol (SYMBICORT) 160-4.5 MCG/ACT inhaler Inhale 2 puffs into the lungs 2 (two) times daily.  1 Inhaler  0  . Cholecalciferol (VITAMIN D) 2000 UNITS CAPS Take by mouth.        . citalopram (CELEXA) 20 MG tablet Take 20 mg by mouth daily.        . clonazePAM (KLONOPIN) 0.5 MG tablet Take 0.5 mg by mouth 2 (two) times daily as needed.        Marland Kitchen OLANZapine (ZYPREXA) 7.5 MG tablet Take 7.5 mg by mouth at bedtime.        . theophylline (THEODUR) 200 MG 12 hr tablet Take 1 tablet (200 mg total) by mouth 2 (two) times daily. With food  60 tablet   1   No current facility-administered medications for this visit.    Review of Systems Review of Systems  Constitutional: Negative for fever, chills and unexpected weight change.  HENT: Negative for congestion, hearing loss, sore throat, trouble swallowing and voice change.   Eyes: Negative for visual disturbance.  Respiratory: Negative for cough and wheezing.   Cardiovascular: Negative for chest pain, palpitations and leg swelling.  Gastrointestinal: Negative for nausea, vomiting, abdominal pain, diarrhea, constipation, blood in stool, abdominal distention and anal bleeding.  Genitourinary: Negative for hematuria, vaginal bleeding and difficulty urinating.  Musculoskeletal: Negative for arthralgias.  Skin: Negative for rash and wound.  Neurological: Negative for seizures, syncope and headaches.  Hematological: Negative for adenopathy. Does not bruise/bleed easily.  Psychiatric/Behavioral: Negative for confusion.    There were no vitals taken for this visit.  Physical Exam Physical Exam  Constitutional: She is oriented to person, place, and time. She appears well-developed and well-nourished.  HENT:  Head: Normocephalic.  Mouth/Throat: No oropharyngeal exudate.  Eyes: EOM are normal. Pupils are equal, round, and reactive to light.  Neck: Normal range of motion. Neck  supple.  Cardiovascular: Normal rate.   Pulmonary/Chest: Effort normal and breath sounds normal. Right breast exhibits no inverted nipple, no mass, no nipple discharge, no skin change and no tenderness. Left breast exhibits no inverted nipple, no mass, no nipple discharge, no skin change and no tenderness. Breasts are symmetrical.    Abdominal: Soft. Bowel sounds are normal.  Musculoskeletal: Normal range of motion.  Lymphadenopathy:    She has no cervical adenopathy.  Neurological: She is alert and oriented to person, place, and time.  Skin: Skin is warm and dry.  Psychiatric: She has a normal mood and affect. Her  behavior is normal. Judgment and thought content normal.    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  MRI requested for surgical planning.  LABS: BUN and creatinine were obtained on site at [facility] at [address]  Results: BUN 6 mg/dL, Creatinine 0.7 mg/dL, estimated GFR 83.  EXAM:  BILATERAL BREAST MRI WITH AND WITHOUT CONTRAST  TECHNIQUE:  Multiplanar, multisequence MR images of both breasts were obtained  prior to and following the intravenous administration of 16 ml of  MultiHance.  THREE-DIMENSIONAL MR IMAGE RENDERING ON INDEPENDENT WORKSTATION:  Three-dimensional MR images were rendered by post-processing of the  original MR data on an independent workstation. The  three-dimensional MR images were interpreted, and findings are  reported in the following complete MRI report for this study. Three  dimensional images were evaluated at the independent DynaCad  workstation.  COMPARISON: Mammography 01/05/2014 (left), 12/21/2013 (left),  12/13/2013 (bilateral). Left breast ultrasound 01/05/2014,  12/21/2013.  FINDINGS:  Breast composition: c. Heterogeneous fibroglandular tissue.  Background parenchymal enhancement: Mild.  Right breast: Numerous tiny enhancing foci throughout the right  breast. No mass or suspicious enhancement.  Left breast: Post biopsy changes involving the inner left breast,  posterior 1/3. Approximate 1.6 cm enhancing mass with type 1  kinetics with tissue marker clip consistent with the biopsy-proven  invasive ductal carcinoma and DCIS. Numerous tiny enhancing foci  elsewhere throughout the left breast. No suspicious enhancement  elsewhere.  Lymph nodes: No pathologic lymphadenopathy.  Ancillary findings: None.  IMPRESSION:  1. Approximate 1.6 cm biopsy-proven invasive ductal carcinoma and  DCIS involving the inner left  breast, posterior 1/3. There are  surrounding post biopsy changes.  2. No evidence of malignancy elsewhere in the left breast.  3. No evidence of malignancy in the right breast.  4. No pathologic lymphadenopathy.  RECOMMENDATION:  Treatment plan.  BI-RADS CATEGORY 6: Known biopsy-proven malignancy.  Electronically Signed  By: Evangeline Dakin M.D.  On: 01/13/2014 14:57    Assessment    Stage 1 left breast cancer    Plan    Pt desires lumpectomy and SLN mapping.. Discussed mastectomy and reconstruction.The procedure has been discussed with the patient. Alternatives to surgery have been discussed with the patient.  Risks of surgery include bleeding,  Infection,  Seroma formation, death,  and the need for further surgery.   The patient understands and wishes to proceed.Sentinel lymph node mapping and dissection has been discussed with the patient.  Risk of bleeding,  Infection,  Seroma formation,  Additional procedures,,  Shoulder weakness ,  Shoulder stiffness,  Nerve and blood vessel injury and reaction to the mapping dyes have been discussed.  Alternatives to surgery have been discussed with the patient.  The patient agrees to proceed.  Nilaya Bouie A. 01/18/2014, 3:46 PM

## 2014-01-25 ENCOUNTER — Telehealth: Payer: Self-pay | Admitting: Hematology and Oncology

## 2014-01-25 ENCOUNTER — Encounter: Payer: Self-pay | Admitting: *Deleted

## 2014-01-25 NOTE — Telephone Encounter (Signed)
, °

## 2014-01-26 ENCOUNTER — Other Ambulatory Visit (INDEPENDENT_AMBULATORY_CARE_PROVIDER_SITE_OTHER): Payer: Self-pay | Admitting: Surgery

## 2014-01-26 ENCOUNTER — Other Ambulatory Visit: Payer: Self-pay

## 2014-01-26 ENCOUNTER — Telehealth: Payer: Self-pay | Admitting: *Deleted

## 2014-01-26 DIAGNOSIS — C50312 Malignant neoplasm of lower-inner quadrant of left female breast: Secondary | ICD-10-CM

## 2014-01-26 NOTE — Telephone Encounter (Signed)
Spoke to pt concerning [place] from 01/18/14. Pt denies questions regarding dx or treatment care plan. Confirmed future appts for surgery and f/u with Dr. Lindi Adie. Encourage pt to call with needs or concerns. Received verbal understanding. Contact information given.

## 2014-01-27 ENCOUNTER — Telehealth: Payer: Self-pay | Admitting: Hematology and Oncology

## 2014-01-27 NOTE — Telephone Encounter (Signed)
, °

## 2014-02-03 ENCOUNTER — Encounter (HOSPITAL_BASED_OUTPATIENT_CLINIC_OR_DEPARTMENT_OTHER): Payer: Self-pay | Admitting: *Deleted

## 2014-02-03 NOTE — Progress Notes (Signed)
To come in for labs after seeds 9/8 Copd-uses o2 at night

## 2014-02-07 ENCOUNTER — Ambulatory Visit
Admission: RE | Admit: 2014-02-07 | Discharge: 2014-02-07 | Disposition: A | Discharge status: Home / Self Care | Payer: Medicare HMO | Source: Ambulatory Visit | Attending: Surgery | Admitting: Surgery

## 2014-02-07 DIAGNOSIS — C50312 Malignant neoplasm of lower-inner quadrant of left female breast: Secondary | ICD-10-CM

## 2014-02-09 ENCOUNTER — Encounter (HOSPITAL_BASED_OUTPATIENT_CLINIC_OR_DEPARTMENT_OTHER): Payer: Medicare HMO | Admitting: Anesthesiology

## 2014-02-09 ENCOUNTER — Ambulatory Visit (HOSPITAL_BASED_OUTPATIENT_CLINIC_OR_DEPARTMENT_OTHER)
Admission: RE | Admit: 2014-02-09 | Discharge: 2014-02-09 | Disposition: A | Discharge status: Home / Self Care | Payer: Medicare HMO | Source: Ambulatory Visit | Attending: Surgery | Admitting: Surgery

## 2014-02-09 ENCOUNTER — Encounter (HOSPITAL_BASED_OUTPATIENT_CLINIC_OR_DEPARTMENT_OTHER)
Admission: RE | Disposition: A | Discharge status: Home / Self Care | Payer: Self-pay | Source: Ambulatory Visit | Attending: Surgery

## 2014-02-09 ENCOUNTER — Ambulatory Visit
Admission: RE | Admit: 2014-02-09 | Discharge: 2014-02-09 | Disposition: A | Discharge status: Home / Self Care | Payer: Medicare HMO | Source: Ambulatory Visit | Attending: Surgery | Admitting: Surgery

## 2014-02-09 ENCOUNTER — Ambulatory Visit (HOSPITAL_BASED_OUTPATIENT_CLINIC_OR_DEPARTMENT_OTHER): Payer: Medicare HMO | Admitting: Anesthesiology

## 2014-02-09 ENCOUNTER — Encounter (HOSPITAL_BASED_OUTPATIENT_CLINIC_OR_DEPARTMENT_OTHER): Payer: Self-pay | Admitting: Anesthesiology

## 2014-02-09 ENCOUNTER — Encounter (HOSPITAL_COMMUNITY)
Admission: RE | Admit: 2014-02-09 | Discharge: 2014-02-09 | Disposition: A | Discharge status: Home / Self Care | Payer: Medicare HMO | Source: Ambulatory Visit | Attending: Surgery | Admitting: Surgery

## 2014-02-09 DIAGNOSIS — C50312 Malignant neoplasm of lower-inner quadrant of left female breast: Secondary | ICD-10-CM

## 2014-02-09 DIAGNOSIS — J4489 Other specified chronic obstructive pulmonary disease: Secondary | ICD-10-CM | POA: Insufficient documentation

## 2014-02-09 DIAGNOSIS — C50919 Malignant neoplasm of unspecified site of unspecified female breast: Secondary | ICD-10-CM | POA: Diagnosis present

## 2014-02-09 DIAGNOSIS — F259 Schizoaffective disorder, unspecified: Secondary | ICD-10-CM | POA: Insufficient documentation

## 2014-02-09 DIAGNOSIS — Z85828 Personal history of other malignant neoplasm of skin: Secondary | ICD-10-CM | POA: Insufficient documentation

## 2014-02-09 DIAGNOSIS — Z801 Family history of malignant neoplasm of trachea, bronchus and lung: Secondary | ICD-10-CM | POA: Insufficient documentation

## 2014-02-09 DIAGNOSIS — F3289 Other specified depressive episodes: Secondary | ICD-10-CM | POA: Diagnosis not present

## 2014-02-09 DIAGNOSIS — E559 Vitamin D deficiency, unspecified: Secondary | ICD-10-CM | POA: Insufficient documentation

## 2014-02-09 DIAGNOSIS — E785 Hyperlipidemia, unspecified: Secondary | ICD-10-CM | POA: Diagnosis not present

## 2014-02-09 DIAGNOSIS — Z8041 Family history of malignant neoplasm of ovary: Secondary | ICD-10-CM | POA: Insufficient documentation

## 2014-02-09 DIAGNOSIS — F329 Major depressive disorder, single episode, unspecified: Secondary | ICD-10-CM | POA: Insufficient documentation

## 2014-02-09 DIAGNOSIS — Z87891 Personal history of nicotine dependence: Secondary | ICD-10-CM | POA: Diagnosis not present

## 2014-02-09 DIAGNOSIS — Z803 Family history of malignant neoplasm of breast: Secondary | ICD-10-CM | POA: Insufficient documentation

## 2014-02-09 DIAGNOSIS — J449 Chronic obstructive pulmonary disease, unspecified: Secondary | ICD-10-CM | POA: Diagnosis not present

## 2014-02-09 DIAGNOSIS — C50319 Malignant neoplasm of lower-inner quadrant of unspecified female breast: Secondary | ICD-10-CM | POA: Insufficient documentation

## 2014-02-09 DIAGNOSIS — Z806 Family history of leukemia: Secondary | ICD-10-CM | POA: Insufficient documentation

## 2014-02-09 HISTORY — DX: Dependence on supplemental oxygen: Z99.81

## 2014-02-09 HISTORY — DX: Presence of spectacles and contact lenses: Z97.3

## 2014-02-09 HISTORY — DX: Shortness of breath: R06.02

## 2014-02-09 HISTORY — PX: BREAST SURGERY: SHX581

## 2014-02-09 SURGERY — RADIOACTIVE SEED GUIDED PARTIAL MASTECTOMY WITH AXILLARY SENTINEL LYMPH NODE BIOPSY
Anesthesia: General | Laterality: Left

## 2014-02-09 MED ORDER — BUPIVACAINE-EPINEPHRINE (PF) 0.5% -1:200000 IJ SOLN
INTRAMUSCULAR | Status: DC | PRN
  Administered 2014-02-09: 30 mL via PERINEURAL

## 2014-02-09 MED ORDER — OXYCODONE-ACETAMINOPHEN 5-325 MG PO TABS: 1.0000 | ORAL_TABLET | ORAL | Status: DC | PRN

## 2014-02-09 MED ORDER — LACTATED RINGERS IV SOLN
INTRAVENOUS | Status: DC
  Administered 2014-02-09: 10:00:00 via INTRAVENOUS

## 2014-02-09 MED ORDER — MIDAZOLAM HCL 2 MG/2ML IJ SOLN
1.0000 mg | INTRAMUSCULAR | Status: DC | PRN
  Administered 2014-02-09 (×2): 1 mg via INTRAVENOUS

## 2014-02-09 MED ORDER — ONDANSETRON HCL 4 MG/2ML IJ SOLN
INTRAMUSCULAR | Status: DC | PRN
Start: 2014-02-09 — End: 2014-02-09
  Administered 2014-02-09: 4 mg via INTRAVENOUS

## 2014-02-09 MED ORDER — FENTANYL CITRATE 0.05 MG/ML IJ SOLN
INTRAMUSCULAR | Status: AC
  Filled 2014-02-09: qty 2

## 2014-02-09 MED ORDER — MIDAZOLAM HCL 2 MG/2ML IJ SOLN
INTRAMUSCULAR | Status: AC
  Filled 2014-02-09: qty 2

## 2014-02-09 MED ORDER — LIDOCAINE HCL (CARDIAC) 20 MG/ML IV SOLN
INTRAVENOUS | Status: DC | PRN
  Administered 2014-02-09: 60 mg via INTRAVENOUS

## 2014-02-09 MED ORDER — 0.9 % SODIUM CHLORIDE (POUR BTL) OPTIME
TOPICAL | Status: DC | PRN
  Administered 2014-02-09: 1000 mL

## 2014-02-09 MED ORDER — DEXAMETHASONE SODIUM PHOSPHATE 4 MG/ML IJ SOLN
INTRAMUSCULAR | Status: DC | PRN
  Administered 2014-02-09: 8 mg via INTRAVENOUS

## 2014-02-09 MED ORDER — BUPIVACAINE-EPINEPHRINE (PF) 0.25% -1:200000 IJ SOLN
INTRAMUSCULAR | Status: DC | PRN
  Administered 2014-02-09: 6 mL

## 2014-02-09 MED ORDER — CEFAZOLIN SODIUM-DEXTROSE 2-3 GM-% IV SOLR
2.0000 g | INTRAVENOUS | Status: AC
  Administered 2014-02-09: 2 g via INTRAVENOUS

## 2014-02-09 MED ORDER — CEFAZOLIN SODIUM-DEXTROSE 2-3 GM-% IV SOLR
INTRAVENOUS | Status: AC
  Filled 2014-02-09: qty 50

## 2014-02-09 MED ORDER — OXYCODONE HCL 5 MG PO TABS: 5.0000 mg | ORAL_TABLET | Freq: Once | ORAL | Status: DC | PRN

## 2014-02-09 MED ORDER — HYDROMORPHONE HCL PF 1 MG/ML IJ SOLN: 0.2500 mg | INTRAMUSCULAR | Status: DC | PRN

## 2014-02-09 MED ORDER — EPHEDRINE SULFATE 50 MG/ML IJ SOLN
INTRAMUSCULAR | Status: DC | PRN
  Administered 2014-02-09: 10 mg via INTRAVENOUS
  Administered 2014-02-09: 5 mg via INTRAVENOUS

## 2014-02-09 MED ORDER — METHYLENE BLUE 1 % INJ SOLN
INTRAMUSCULAR | Status: AC
  Filled 2014-02-09: qty 10

## 2014-02-09 MED ORDER — FENTANYL CITRATE 0.05 MG/ML IJ SOLN
50.0000 ug | INTRAMUSCULAR | Status: DC | PRN
  Administered 2014-02-09 (×2): 50 ug via INTRAVENOUS

## 2014-02-09 MED ORDER — ONDANSETRON HCL 4 MG/2ML IJ SOLN: 4.0000 mg | Freq: Once | INTRAMUSCULAR | Status: DC | PRN

## 2014-02-09 MED ORDER — MEPERIDINE HCL 25 MG/ML IJ SOLN: 6.2500 mg | INTRAMUSCULAR | Status: DC | PRN

## 2014-02-09 MED ORDER — BUPIVACAINE-EPINEPHRINE (PF) 0.25% -1:200000 IJ SOLN
INTRAMUSCULAR | Status: AC
  Filled 2014-02-09: qty 30

## 2014-02-09 MED ORDER — TECHNETIUM TC 99M SULFUR COLLOID FILTERED
1.0000 | Freq: Once | INTRAVENOUS | Status: AC | PRN
  Administered 2014-02-09: 1 via INTRADERMAL

## 2014-02-09 MED ORDER — SODIUM CHLORIDE 0.9 % IJ SOLN
INTRAMUSCULAR | Status: DC | PRN
  Administered 2014-02-09: 11:00:00

## 2014-02-09 MED ORDER — FENTANYL CITRATE 0.05 MG/ML IJ SOLN
INTRAMUSCULAR | Status: DC | PRN
  Administered 2014-02-09: 25 ug via INTRAVENOUS

## 2014-02-09 MED ORDER — OXYCODONE HCL 5 MG/5ML PO SOLN: 5.0000 mg | Freq: Once | ORAL | Status: DC | PRN

## 2014-02-09 MED ORDER — OXYCODONE HCL 5 MG PO TABS
ORAL_TABLET | ORAL | Status: AC
  Filled 2014-02-09: qty 1

## 2014-02-09 MED ORDER — SODIUM CHLORIDE 0.9 % IJ SOLN
INTRAMUSCULAR | Status: AC
  Filled 2014-02-09: qty 10

## 2014-02-09 MED ORDER — PROPOFOL 10 MG/ML IV BOLUS
INTRAVENOUS | Status: DC | PRN
  Administered 2014-02-09: 150 mg via INTRAVENOUS

## 2014-02-09 MED ORDER — CHLORHEXIDINE GLUCONATE 4 % EX LIQD: 1.0000 "application " | Freq: Once | CUTANEOUS | Status: DC

## 2014-02-09 SURGICAL SUPPLY — 53 items
APPLIER CLIP 9.375 MED OPEN (MISCELLANEOUS) ×3
BINDER BREAST LRG (GAUZE/BANDAGES/DRESSINGS) IMPLANT
BINDER BREAST MEDIUM (GAUZE/BANDAGES/DRESSINGS) IMPLANT
BINDER BREAST XLRG (GAUZE/BANDAGES/DRESSINGS) ×3 IMPLANT
BINDER BREAST XXLRG (GAUZE/BANDAGES/DRESSINGS) IMPLANT
BLADE SURG 15 STRL LF DISP TIS (BLADE) ×2 IMPLANT
BLADE SURG 15 STRL SS (BLADE) ×4
CANISTER SUC SOCK COL 7IN (MISCELLANEOUS) IMPLANT
CANISTER SUCT 1200ML W/VALVE (MISCELLANEOUS) ×3 IMPLANT
CHLORAPREP W/TINT 26ML (MISCELLANEOUS) ×3 IMPLANT
CLIP APPLIE 9.375 MED OPEN (MISCELLANEOUS) ×1 IMPLANT
CLIP TI WIDE RED SMALL 6 (CLIP) IMPLANT
COVER MAYO STAND STRL (DRAPES) ×3 IMPLANT
COVER PROBE W GEL 5X96 (DRAPES) ×3 IMPLANT
COVER TABLE BACK 60X90 (DRAPES) ×3 IMPLANT
DECANTER SPIKE VIAL GLASS SM (MISCELLANEOUS) IMPLANT
DERMABOND ADVANCED (GAUZE/BANDAGES/DRESSINGS) ×2
DERMABOND ADVANCED .7 DNX12 (GAUZE/BANDAGES/DRESSINGS) ×1 IMPLANT
DEVICE DUBIN W/COMP PLATE 8390 (MISCELLANEOUS) ×3 IMPLANT
DRAPE LAPAROSCOPIC ABDOMINAL (DRAPES) IMPLANT
DRAPE UTILITY XL STRL (DRAPES) ×3 IMPLANT
ELECT COATED BLADE 2.86 ST (ELECTRODE) ×3 IMPLANT
ELECT REM PT RETURN 9FT ADLT (ELECTROSURGICAL) ×3
ELECTRODE REM PT RTRN 9FT ADLT (ELECTROSURGICAL) ×1 IMPLANT
GLOVE BIOGEL PI IND STRL 7.0 (GLOVE) ×1 IMPLANT
GLOVE BIOGEL PI IND STRL 8 (GLOVE) ×1 IMPLANT
GLOVE BIOGEL PI INDICATOR 7.0 (GLOVE) ×2
GLOVE BIOGEL PI INDICATOR 8 (GLOVE) ×2
GLOVE ECLIPSE 6.5 STRL STRAW (GLOVE) ×3 IMPLANT
GLOVE ECLIPSE 8.0 STRL XLNG CF (GLOVE) ×6 IMPLANT
GLOVE EXAM NITRILE MD LF STRL (GLOVE) ×3 IMPLANT
GOWN STRL REUS W/ TWL LRG LVL3 (GOWN DISPOSABLE) ×2 IMPLANT
GOWN STRL REUS W/TWL LRG LVL3 (GOWN DISPOSABLE) ×4
KIT MARKER MARGIN INK (KITS) ×3 IMPLANT
NDL SAFETY ECLIPSE 18X1.5 (NEEDLE) ×1 IMPLANT
NEEDLE HYPO 18GX1.5 SHARP (NEEDLE) ×2
NEEDLE HYPO 25X1 1.5 SAFETY (NEEDLE) ×6 IMPLANT
NS IRRIG 1000ML POUR BTL (IV SOLUTION) ×3 IMPLANT
PACK BASIN DAY SURGERY FS (CUSTOM PROCEDURE TRAY) ×3 IMPLANT
PENCIL BUTTON HOLSTER BLD 10FT (ELECTRODE) ×3 IMPLANT
SLEEVE SCD COMPRESS KNEE MED (MISCELLANEOUS) ×3 IMPLANT
SPONGE LAP 4X18 X RAY DECT (DISPOSABLE) ×6 IMPLANT
STAPLER VISISTAT 35W (STAPLE) IMPLANT
SUT MNCRL AB 4-0 PS2 18 (SUTURE) ×6 IMPLANT
SUT SILK 2 0 SH (SUTURE) IMPLANT
SUT VIC AB 3-0 SH 27 (SUTURE) ×4
SUT VIC AB 3-0 SH 27X BRD (SUTURE) ×2 IMPLANT
SYR CONTROL 10ML LL (SYRINGE) ×6 IMPLANT
TOWEL OR 17X24 6PK STRL BLUE (TOWEL DISPOSABLE) ×6 IMPLANT
TOWEL OR NON WOVEN STRL DISP B (DISPOSABLE) IMPLANT
TUBE CONNECTING 20'X1/4 (TUBING) ×1
TUBE CONNECTING 20X1/4 (TUBING) ×2 IMPLANT
YANKAUER SUCT BULB TIP NO VENT (SUCTIONS) ×3 IMPLANT

## 2014-02-09 NOTE — Transfer of Care (Signed)
Immediate Anesthesia Transfer of Care Note  Patient: Summer Williams  Procedure(s) Performed: Procedure(s):  LEFT BREAST SEED LUMPECTOMY AND SENTINEL LYMPH NODE MAPPING (Left)  Patient Location: PACU  Anesthesia Type:GA combined with regional for post-op pain  Level of Consciousness: awake and patient cooperative  Airway & Oxygen Therapy: Patient Spontanous Breathing and Patient connected to face mask oxygen  Post-op Assessment: Report given to PACU RN and Post -op Vital signs reviewed and stable  Post vital signs: Reviewed and stable  Complications: No apparent anesthesia complications

## 2014-02-09 NOTE — H&P (View-Only) (Signed)
Patient ID: Summer Williams, female   DOB: Aug 09, 1945, 68 y.o.   MRN: [id number]  No chief complaint on file.   HPI Summer Williams is a 68 y.o. female.  Pt sent at the request of Elby Showers, MD for left breast mammographic abnormality core bx shown to be IDC ER PR POS HER 2 NEU NEGATIVE.  Pt denies mass pain or discharge.  f  HPI  Past Medical History  Diagnosis Date  . Schizoaffective disorder   . History of alcohol abuse   . Hyperlipidemia   . Vitamin D deficiency   . COPD (chronic obstructive pulmonary disease)   . Depression   . Skin cancer     Past Surgical History  Procedure Laterality Date  . Cholecystectomy    . Eye surgery  2005    Cataract OD    Family History  Problem Relation Age of Onset  . Heart disease Father   . Cancer Maternal Grandfather   . Lung cancer Maternal Grandfather   . Cancer Paternal Grandfather   . Leukemia Paternal Grandfather   . Ovarian cancer Maternal Aunt   . Breast cancer Maternal Aunt     Social History History  Substance Use Topics  . Smoking status: Former Smoker -- 1.00 packs/day for 50 years    Types: Cigarettes    Quit date: 04/10/2006  . Smokeless tobacco: Never Used  . Alcohol Use: No    No Known Allergies  Current Outpatient Prescriptions  Medication Sig Dispense Refill  . atorvastatin (LIPITOR) 40 MG tablet TAKE 1 TABLET BY MOUTH EVERY DAY  90 tablet  1  . budesonide-formoterol (SYMBICORT) 160-4.5 MCG/ACT inhaler Inhale 2 puffs into the lungs 2 (two) times daily.  1 Inhaler  0  . Cholecalciferol (VITAMIN D) 2000 UNITS CAPS Take by mouth.        . citalopram (CELEXA) 20 MG tablet Take 20 mg by mouth daily.        . clonazePAM (KLONOPIN) 0.5 MG tablet Take 0.5 mg by mouth 2 (two) times daily as needed.        Marland Kitchen OLANZapine (ZYPREXA) 7.5 MG tablet Take 7.5 mg by mouth at bedtime.        . theophylline (THEODUR) 200 MG 12 hr tablet Take 1 tablet (200 mg total) by mouth 2 (two) times daily. With food  60 tablet   1   No current facility-administered medications for this visit.    Review of Systems Review of Systems  Constitutional: Negative for fever, chills and unexpected weight change.  HENT: Negative for congestion, hearing loss, sore throat, trouble swallowing and voice change.   Eyes: Negative for visual disturbance.  Respiratory: Negative for cough and wheezing.   Cardiovascular: Negative for chest pain, palpitations and leg swelling.  Gastrointestinal: Negative for nausea, vomiting, abdominal pain, diarrhea, constipation, blood in stool, abdominal distention and anal bleeding.  Genitourinary: Negative for hematuria, vaginal bleeding and difficulty urinating.  Musculoskeletal: Negative for arthralgias.  Skin: Negative for rash and wound.  Neurological: Negative for seizures, syncope and headaches.  Hematological: Negative for adenopathy. Does not bruise/bleed easily.  Psychiatric/Behavioral: Negative for confusion.    There were no vitals taken for this visit.  Physical Exam Physical Exam  Constitutional: She is oriented to person, place, and time. She appears well-developed and well-nourished.  HENT:  Head: Normocephalic.  Mouth/Throat: No oropharyngeal exudate.  Eyes: EOM are normal. Pupils are equal, round, and reactive to light.  Neck: Normal range of motion. Neck  supple.  Cardiovascular: Normal rate.   Pulmonary/Chest: Effort normal and breath sounds normal. Right breast exhibits no inverted nipple, no mass, no nipple discharge, no skin change and no tenderness. Left breast exhibits no inverted nipple, no mass, no nipple discharge, no skin change and no tenderness. Breasts are symmetrical.    Abdominal: Soft. Bowel sounds are normal.  Musculoskeletal: Normal range of motion.  Lymphadenopathy:    She has no cervical adenopathy.  Neurological: She is alert and oriented to person, place, and time.  Skin: Skin is warm and dry.  Psychiatric: She has a normal mood and affect. Her  behavior is normal. Judgment and thought content normal.    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  MRI requested for surgical planning.  LABS: BUN and creatinine were obtained on site at [facility] at [address]  Results: BUN 6 mg/dL, Creatinine 0.7 mg/dL, estimated GFR 83.  EXAM:  BILATERAL BREAST MRI WITH AND WITHOUT CONTRAST  TECHNIQUE:  Multiplanar, multisequence MR images of both breasts were obtained  prior to and following the intravenous administration of 16 ml of  MultiHance.  THREE-DIMENSIONAL MR IMAGE RENDERING ON INDEPENDENT WORKSTATION:  Three-dimensional MR images were rendered by post-processing of the  original MR data on an independent workstation. The  three-dimensional MR images were interpreted, and findings are  reported in the following complete MRI report for this study. Three  dimensional images were evaluated at the independent DynaCad  workstation.  COMPARISON: Mammography 01/05/2014 (left), 12/21/2013 (left),  12/13/2013 (bilateral). Left breast ultrasound 01/05/2014,  12/21/2013.  FINDINGS:  Breast composition: c. Heterogeneous fibroglandular tissue.  Background parenchymal enhancement: Mild.  Right breast: Numerous tiny enhancing foci throughout the right  breast. No mass or suspicious enhancement.  Left breast: Post biopsy changes involving the inner left breast,  posterior 1/3. Approximate 1.6 cm enhancing mass with type 1  kinetics with tissue marker clip consistent with the biopsy-proven  invasive ductal carcinoma and DCIS. Numerous tiny enhancing foci  elsewhere throughout the left breast. No suspicious enhancement  elsewhere.  Lymph nodes: No pathologic lymphadenopathy.  Ancillary findings: None.  IMPRESSION:  1. Approximate 1.6 cm biopsy-proven invasive ductal carcinoma and  DCIS involving the inner left  breast, posterior 1/3. There are  surrounding post biopsy changes.  2. No evidence of malignancy elsewhere in the left breast.  3. No evidence of malignancy in the right breast.  4. No pathologic lymphadenopathy.  RECOMMENDATION:  Treatment plan.  BI-RADS CATEGORY 6: Known biopsy-proven malignancy.  Electronically Signed  By: Evangeline Dakin M.D.  On: 01/13/2014 14:57    Assessment    Stage 1 left breast cancer    Plan    Pt desires lumpectomy and SLN mapping.. Discussed mastectomy and reconstruction.The procedure has been discussed with the patient. Alternatives to surgery have been discussed with the patient.  Risks of surgery include bleeding,  Infection,  Seroma formation, death,  and the need for further surgery.   The patient understands and wishes to proceed.Sentinel lymph node mapping and dissection has been discussed with the patient.  Risk of bleeding,  Infection,  Seroma formation,  Additional procedures,,  Shoulder weakness ,  Shoulder stiffness,  Nerve and blood vessel injury and reaction to the mapping dyes have been discussed.  Alternatives to surgery have been discussed with the patient.  The patient agrees to proceed.  Xayne Brumbaugh A. 01/18/2014, 3:46 PM

## 2014-02-09 NOTE — Anesthesia Postprocedure Evaluation (Signed)
  Anesthesia Post-op Note  Patient: Summer Williams  Procedure(s) Performed: Procedure(s):  LEFT BREAST SEED LUMPECTOMY AND SENTINEL LYMPH NODE MAPPING (Left)  Patient Location: PACU  Anesthesia Type:General and block  Level of Consciousness: awake and alert   Airway and Oxygen Therapy: Patient Spontanous Breathing  Post-op Pain: mild  Post-op Assessment: Post-op Vital signs reviewed, Patient's Cardiovascular Status Stable and Respiratory Function Stable  Post-op Vital Signs: Reviewed  Filed Vitals:   02/09/14 1335  BP:   Pulse: 66  Temp:   Resp: 15    Complications: No apparent anesthesia complications

## 2014-02-09 NOTE — Discharge Instructions (Signed)
[place] Office Phone Number [phone]  BREAST BIOPSY/ PARTIAL MASTECTOMY: POST OP INSTRUCTIONS  Always review your discharge instruction sheet given to you by the facility where your surgery was performed.  IF YOU HAVE DISABILITY OR FAMILY LEAVE FORMS, YOU MUST BRING THEM TO THE OFFICE FOR PROCESSING.  DO NOT GIVE THEM TO YOUR DOCTOR.  1. A prescription for pain medication may be given to you upon discharge.  Take your pain medication as prescribed, if needed.  If narcotic pain medicine is not needed, then you may take acetaminophen (Tylenol) or ibuprofen (Advil) as needed. 2. Take your usually prescribed medications unless otherwise directed 3. If you need a refill on your pain medication, please contact your pharmacy.  They will contact our office to request authorization.  Prescriptions will not be filled after 5pm or on week-ends. 4. You should eat very light the first 24 hours after surgery, such as soup, crackers, pudding, etc.  Resume your normal diet the day after surgery. 5. Most patients will experience some swelling and bruising in the breast.  Ice packs and a good support bra will help.  Swelling and bruising can take several days to resolve.  6. It is common to experience some constipation if taking pain medication after surgery.  Increasing fluid intake and taking a stool softener will usually help or prevent this problem from occurring.  A mild laxative (Milk of Magnesia or Miralax) should be taken according to package directions if there are no bowel movements after 48 hours. 7. Unless discharge instructions indicate otherwise, you may remove your bandages 24-48 hours after surgery, and you may shower at that time.  You may have steri-strips (small skin tapes) in place directly over the incision.  These strips should be left on the skin for 7-10 days.  If your surgeon used skin glue on the incision, you may shower in 24 hours.  The glue will flake off over the  next 2-3 weeks.  Any sutures or staples will be removed at the office during your follow-up visit. 8. ACTIVITIES:  You may resume regular daily activities (gradually increasing) beginning the next day.  Wearing a good support bra or sports bra minimizes pain and swelling.  You may have sexual intercourse when it is comfortable. a. You may drive when you no longer are taking prescription pain medication, you can comfortably wear a seatbelt, and you can safely maneuver your car and apply brakes. b. RETURN TO WORK:  ______________________________________________________________________________________ 9. You should see your doctor in the office for a follow-up appointment approximately two weeks after your surgery.  Your doctors nurse will typically make your follow-up appointment when she calls you with your pathology report.  Expect your pathology report 2-3 business days after your surgery.  You may call to check if you do not hear from [place] after three days. 10. OTHER INSTRUCTIONS: _______________________________________________________________________________________________ _________DRINK PLENTY OF WATER AND AVOID EXCESS SUGAR INTAKE ____________________________________________________________________________________________________________________________ _____________________________________________________________________________________________________________________________________ _____________________________________________________________________________________________________________________________________  WHEN TO CALL YOUR DOCTOR: 1. Fever over 101.0 2. Nausea and/or vomiting. 3. Extreme swelling or bruising. 4. Continued bleeding from incision. 5. Increased pain, redness, or drainage from the incision.  The clinic staff is available to answer your questions during regular business hours.  Please dont hesitate to call and ask to speak to one of the nurses for clinical concerns.  If  you have a medical emergency, go to the nearest emergency room or call 911.  A surgeon from [facility] is always on call at the  hospital.  For further questions, please visit [email or web address]    Post Anesthesia Home Care Instructions  Activity: Get plenty of rest for the remainder of the day. A responsible adult should stay with you for 24 hours following the procedure.  For the next 24 hours, DO NOT: -Drive a car -Paediatric nurse -Drink alcoholic beverages -Take any medication unless instructed by your physician -Make any legal decisions or sign important papers.  Meals: Start with liquid foods such as gelatin or soup. Progress to regular foods as tolerated. Avoid greasy, spicy, heavy foods. If nausea and/or vomiting occur, drink only clear liquids until the nausea and/or vomiting subsides. Call your physician if vomiting continues.  Special Instructions/Symptoms: Your throat may feel dry or sore from the anesthesia or the breathing tube placed in your throat during surgery. If this causes discomfort, gargle with warm salt water. The discomfort should disappear within 24 hours.

## 2014-02-09 NOTE — Interval H&P Note (Signed)
History and Physical Interval Note:  02/09/2014 10:32 AM  Summer Williams  has presented today for surgery, with the diagnosis of left breast cancer  The various methods of treatment have been discussed with the patient and family. After consideration of risks, benefits and other options for treatment, the patient has consented to  Procedure(s):  LEFT BREAST SEED LUMPECTOMY AND SENTINEL LYMPH NODE MAPPING (Left) as a surgical intervention .  The patient's history has been reviewed, patient examined, no change in status, stable for surgery.  I have reviewed the patient's chart and labs.  Questions were answered to the patient's satisfaction.     Kassem Kibbe A.

## 2014-02-09 NOTE — Progress Notes (Signed)
Bilateral sentinel node injections with emotional support given.

## 2014-02-09 NOTE — Anesthesia Preprocedure Evaluation (Signed)
Anesthesia Evaluation  Patient identified by MRN, date of birth, ID band Patient awake    Reviewed: Allergy & Precautions, H&P , NPO status , Patient's Chart, lab work & pertinent test results  Airway Mallampati: I TM Distance: >3 FB Neck ROM: Full    Dental   Pulmonary shortness of breath, COPDformer smoker,          Cardiovascular     Neuro/Psych    GI/Hepatic   Endo/Other    Renal/GU      Musculoskeletal   Abdominal   Peds  Hematology   Anesthesia Other Findings   Reproductive/Obstetrics                           Anesthesia Physical Anesthesia Plan  ASA: III  Anesthesia Plan: General   Post-op Pain Management:    Induction: Intravenous  Airway Management Planned: LMA  Additional Equipment:   Intra-op Plan:   Post-operative Plan: Extubation in OR  Informed Consent: I have reviewed the patients History and Physical, chart, labs and discussed the procedure including the risks, benefits and alternatives for the proposed anesthesia with the patient or authorized representative who has indicated his/her understanding and acceptance.     Plan Discussed with: CRNA and Surgeon  Anesthesia Plan Comments:         Anesthesia Quick Evaluation

## 2014-02-09 NOTE — Progress Notes (Signed)
Assisted Dr. Ossey with left, ultrasound guided, pectoralis block. Side rails up, monitors on throughout procedure. See vital signs in flow sheet. Tolerated Procedure well. 

## 2014-02-09 NOTE — Op Note (Signed)
Preoperative diagnosis: Stage I left breast cancer upper quadrant  Postop diagnosis same  Procedure: #1 left breast seed Localized lumpectomy and left breast sentinel lymph node mapping with methylene blue dye  Surgeon: Erroll Luna M.D.  EBL: Minimal  Specimens: #1 left breast mass #23 left axillary sentinel nodes hot and blue  Drains: None  IV fluids: 400 cc crystalloid  Indications for procedure: Patient presents for left breast conservation after diagnosis of left breast cancer stage I upper quadrant. Discussed mastectomy versus breast conservation. Risk and benefits reviewed with the patient. Discussed SLN  mapping and implications. Sentinel lymph node mapping and dissection has been discussed with the patient.  Risk of bleeding,  Infection,  Seroma formation,  Additional procedures,,  Shoulder weakness ,  Shoulder stiffness,  Nerve and blood vessel injury and reaction to the mapping dyes have been discussed.  Alternatives to surgery have been discussed with the patient.  The patient agrees to proceed.The procedure has been discussed with the patient. Alternatives to surgery have been discussed with the patient.  Risks of surgery include bleeding,  Infection,  Seroma formation, death,  and the need for further surgery.   The patient understands and wishes to proceed.  Description of procedure: Patient underwent placement of seed a radiology and injection left breast with technetium sulfur colloid. Patient met in the holding area and questions answered. Seed verified using neoprobe left breast medial. Questions are answered. Patient taken back to the operating room and placed upon the OR table. After induction of LMA anesthesia, left nipple was prepped with alcohol and 4 cc of methylene blue dye were injected and massage. Left breast and left axilla were prepped and draped in sterile fashion and timeout was done prior to injection of methylene blue to verify left side is correct side, correct  patient and correct procedure. Neoprobe was used to localize the location the medial left breast upper quadrant incision was made over this. All tissue around the see the clip were excised with grossly negative margins. Radiograph revealed seeding clip to be in the middle the specimen. Cavity was made hemostatic with cautery and pressure. The cavity was clipped after insuring hemostasis and closed in 2 layers with a deep layer of 3-0 Vicryl and 4-0 Monocryl subcuticular stitch.  The left axillary sentinel node was then localize the neoprobe. 0.25% Sensorcaine was infiltrated in the skin an incision made a left axilla. Dissection was carried down 3 hot and blue sentinel nodes were identified and sent to pathology for evaluation. Hemostasis achieved and background counts approached 0. Wound closed with deep layer of 3-0 Vicryl and 4-0 Monocryl subcuticular stitch. Dermabond applied to both incisions. All final counts sponge, needle and this was found to be correct. Binder placed. Patient awoke,   Extubated and  taken to recovery in satisfactory condition.

## 2014-02-09 NOTE — Anesthesia Postprocedure Evaluation (Signed)
Anesthesia Post Note  Patient: Summer Williams  Procedure(s) Performed: Procedure(s) (LRB):  LEFT BREAST SEED LUMPECTOMY AND SENTINEL LYMPH NODE MAPPING (Left)  Anesthesia type: general  Patient location: PACU  Post pain: Pain level controlled  Post assessment: Patient's Cardiovascular Status Stable  Last Vitals:  Filed Vitals:   02/09/14 1425  BP:   Pulse: 73  Temp: 37.1 C  Resp: 18    Post vital signs: Reviewed and stable  Level of consciousness: sedated  Complications: No apparent anesthesia complications

## 2014-02-09 NOTE — Anesthesia Procedure Notes (Addendum)
Anesthesia Regional Block:  Pectoralis block  Pre-Anesthetic Checklist: ,, timeout performed, Correct Patient, Correct Site, Correct Laterality, Correct Procedure, Correct Position, site marked, Risks and benefits discussed,  Surgical consent,  Pre-op evaluation,  At surgeon's request and post-op pain management  Laterality: Left  Prep: chloraprep       Needles:  Injection technique: Single-shot     Needle Length: 9cm 9 cm Needle Gauge: 21 and 21 G    Additional Needles:  Procedures: ultrasound guided (picture in chart) Pectoralis block Narrative:  Start time: 02/09/2014 10:10 AM End time: 02/09/2014 10:25 AM Injection made incrementally with aspirations every 5 mL.  Additional Notes: Monitors applied. Patient sedated. Sterile prep and drape,hand hygiene and sterile gloves were used. Relevant anatomy identified.Needle position confirmed.Local anesthetic injected incrementally after negative aspiration. Vascular puncture avoided. No complications. Image printed for medical record.The patient tolerated the procedure well.       Procedure Name: LMA Insertion Date/Time: 02/09/2014 11:00 AM Performed by: Mechele Claude Pre-anesthesia Checklist: Patient identified, Emergency Drugs available, Suction available and Patient being monitored Patient Re-evaluated:Patient Re-evaluated prior to inductionOxygen Delivery Method: Circle System Utilized Preoxygenation: Pre-oxygenation with 100% oxygen Intubation Type: IV induction Ventilation: Mask ventilation without difficulty LMA: LMA inserted LMA Size: 4.0 Number of attempts: 1 Airway Equipment and Method: bite block Placement Confirmation: positive ETCO2 Tube secured with: Tape Dental Injury: Teeth and Oropharynx as per pre-operative assessment

## 2014-02-20 NOTE — Progress Notes (Signed)
Location of Breast Cancer:Left breast invasive ductal carcinoma grade 2.  Histology per Pathology Report: 02/09/2014:FINAL DIAGNOSIS Diagnosis 1. Breast, lumpectomy, Left - INVASIVE GRADE II DUCTAL CARCINOMA SPANNING 1.5 CM IN GREATEST DIMENSION. - LOW TO INTERMEDIATE GRADE DUCTAL CARCINOMA IN SITU. - INVASIVE DUCTAL CARCINOMA IS CLOSE TO POSTERIOR ASPECT OF INFERIOR MARGIN (0.15 CM AWAY). - LOW GRADE DUCTAL CARCINOMA IN SITU IS EXTREMELY CLOSE (LESS THAN 0.1 CM) TO POSTERIOR ASPECT OF INFERIOR MARGIN. - OTHER MARGINS ARE NEGATIVE. - SEE ONCOLOGY TEMPLATE. 2. Lymph node, sentinel, biopsy, Left axillary #1 - ONE BENIGN LYMPH NODE WITH NO TUMOR SEEN (0/1). - SEE COMMENT. 3. Lymph node, sentinel, biopsy, Left axillary #2 - ONE BENIGN LYMPH NODE WITH NO TUMOR SEEN (0/1). - SEE COMMENT. 4. Lymph node, sentinel, biopsy, Left axillary #3 - ONE BENIGN LYMPH NODE WITH NO TUMOR SEEN (0/1). - SEE COMMENT. 8INAL DIAGNOSIS Diagnosis 01/05/2014 1. Breast, left, needle core biopsy, mass, 9 o'clock - INVASIVE DUCTAL CARCINOMA. - DUCTAL CARCINOMA IN SITU. - SEE COMMENT. 2. Lymph node, needle/core biopsy, left axillary - THERE IS NO EVIDENCE OF CARCINOMA IN 1 OF 1 LYMPH NODE (0/1).  Receptor Status: ER(+), PR (+), Her2-neu (-)  Did patient present with symptoms (if so, please note symptoms) or was this found on screening mammography?:  Past/Anticipated interventions by surgeon, if any:02/09/2014 Dr.Thomas Cornette:Procedure: #1 left breast seed Localized lumpectomy and left breast sentinel lymph node mapping with methylene blue dye   Past/Anticipated interventions by medical oncology, if BWG:[id number] up with Dr.Gudena on 02/24/14 to discuss whether to have chemotherapy or not.  Lymphedema issues, if any:No  Pain issues, if TTS:[id number] of left breast, occasional breast pain at bedtime.  SAFETY ISSUES:  Prior radiation? No  Pacemaker/ICD? No  Possible current pregnancy?Post  menopausal  Is the patient on methotrexate? No  Current Complaints / other details:In terms of breast cancer risk profile:  She menarched at early age of 28 and went to menopause at age 41  She had one pregnancy, her first child was born at age 64  She did not received birth control pills  She was never exposed to fertility medications or hormone replacement therapy.  She has family history of Breast and GYN cancers     Schizoaffective Disorder History of alcohol abuse and depression Quit smoking in 2007 Wears oxygen at night:COPD NKDA    Summer Williams, Summer Drown, RN 02/20/2014,10:52 AM

## 2014-02-22 ENCOUNTER — Encounter: Payer: Self-pay | Admitting: *Deleted

## 2014-02-22 ENCOUNTER — Encounter: Payer: Self-pay | Admitting: Radiation Oncology

## 2014-02-22 NOTE — Progress Notes (Signed)
Received order for oncotype dx. Requisition sent to pathology.

## 2014-02-23 ENCOUNTER — Encounter: Payer: Self-pay | Admitting: Radiation Oncology

## 2014-02-23 ENCOUNTER — Telehealth: Payer: Self-pay | Admitting: Hematology and Oncology

## 2014-02-23 ENCOUNTER — Ambulatory Visit
Admission: RE | Admit: 2014-02-23 | Discharge: 2014-02-23 | Disposition: A | Discharge status: Home / Self Care | Payer: Medicare HMO | Source: Ambulatory Visit | Attending: Radiation Oncology | Admitting: Radiation Oncology

## 2014-02-23 VITALS — BP 134/74 | HR 81 | Temp 98.4°F | Wt 169.9 lb

## 2014-02-23 DIAGNOSIS — C50312 Malignant neoplasm of lower-inner quadrant of left female breast: Secondary | ICD-10-CM

## 2014-02-23 DIAGNOSIS — Z510 Encounter for antineoplastic radiation therapy: Secondary | ICD-10-CM | POA: Diagnosis present

## 2014-02-23 DIAGNOSIS — C50919 Malignant neoplasm of unspecified site of unspecified female breast: Secondary | ICD-10-CM | POA: Insufficient documentation

## 2014-02-23 HISTORY — DX: Malignant neoplasm of unspecified site of unspecified female breast: C50.919

## 2014-02-23 NOTE — Telephone Encounter (Signed)
, °

## 2014-02-23 NOTE — Addendum Note (Signed)
Encounter addended by: Arlyss Repress, RN on: 02/23/2014 11:55 AM<BR>     Documentation filed: Charges VN

## 2014-02-23 NOTE — Progress Notes (Signed)
Please see the Nurse Progress Note in the MD Initial Consult Encounter for this patient. 

## 2014-02-23 NOTE — Progress Notes (Signed)
   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  She had her lumpectomy on 9/10 which showed a 1.5 cm invasive cancer with associated DCIS. The DCIS was 1 mm from the posterior margin and 3 lymph nodes were bening.  She has discussed re-excision with Dr. Brantley Stage and has decided to proceed with RT. She is recovering well from surgery. She has good range of motion in her arm. She was scheduled to see Dr. Lindi Adie tomorrow. An Oncotype test has been sent. She would like to proceed with radiation.   Allergies: No Known Allergies  Medications:  Current Outpatient Prescriptions  Medication Sig Dispense Refill  . atorvastatin (LIPITOR) 40 MG tablet TAKE 1 TABLET BY MOUTH EVERY DAY  90 tablet  1  . Cholecalciferol (VITAMIN D) 2000 UNITS CAPS Take by mouth.        . citalopram (CELEXA) 20 MG tablet Take 20 mg by mouth daily.        Marland Kitchen loperamide (IMODIUM) 2 MG capsule Take by mouth as needed for diarrhea or loose stools.      Marland Kitchen OLANZapine (ZYPREXA) 7.5 MG tablet Take 7.5 mg by mouth at bedtime.        Marland Kitchen oxyCODONE-acetaminophen (ROXICET) 5-325 MG per tablet Take 1 tablet by mouth every 4 (four) hours as needed.  30 tablet  0  . Probiotic Product (PROBIOTIC DAILY PO) Take by mouth.      . theophylline (THEODUR) 200 MG 12 hr tablet Take 1 tablet (200 mg total) by mouth 2 (two) times daily. With food  60 tablet  1   No current facility-administered medications for this encounter.    Physical Exam:  Filed Vitals:   02/23/14 0848  BP: 134/74  Pulse: 81  Temp: 98.4 F (36.9 C)  Weight: 169 lb 14.4 oz (77.066 kg)  Healing surgical incision. No evidence of infection  IMPRESSION: Summer Williams is a 68 y.o. female s/p breast conservation with  pending oncotype  PLAN:  I spoke to the patient today regarding her diagnosis and options for treatment. We discussed the equivalence in terms of survival and local failure between mastectomy and breast conservation. We discussed the role of radiation in decreasing local failures in patients who undergo lumpectomy. We discussed the process of simulation and the placement tattoos. We discussed 4-6 weeks of treatment as an outpatient. We discussed the possibility of asymptomatic lung damage. We discussed the low likelihood of secondary malignancies. We discussed the possible side effects including but not limited to skin redness, fatigue, permanent skin darkening, and breast swelling. We discussed the process of simulation and the placement of tattoos.  I scheduled her for simulation October 13th. I did clarify with her that if she needed chemotherapy this would be performed prior to radiation. We will also reschedule her appointment with Dr. Lindi Adie so that she can meet with him with her Oncotype score which will take a few weeks to get back.   We discussed a higher boost dose due to the close DCIS margin.    Thea Silversmith, MD

## 2014-02-24 ENCOUNTER — Ambulatory Visit: Payer: Medicare HMO | Admitting: Hematology and Oncology

## 2014-03-01 ENCOUNTER — Ambulatory Visit: Payer: Medicare HMO | Admitting: Hematology and Oncology

## 2014-03-01 ENCOUNTER — Ambulatory Visit (INDEPENDENT_AMBULATORY_CARE_PROVIDER_SITE_OTHER): Payer: Medicare HMO | Admitting: Internal Medicine

## 2014-03-01 ENCOUNTER — Encounter: Payer: Self-pay | Admitting: Internal Medicine

## 2014-03-01 VITALS — BP 128/68 | HR 73 | Ht 64.0 in | Wt 168.4 lb

## 2014-03-01 DIAGNOSIS — F259 Schizoaffective disorder, unspecified: Secondary | ICD-10-CM

## 2014-03-01 DIAGNOSIS — Z23 Encounter for immunization: Secondary | ICD-10-CM

## 2014-03-01 DIAGNOSIS — R197 Diarrhea, unspecified: Secondary | ICD-10-CM

## 2014-03-01 DIAGNOSIS — J4489 Other specified chronic obstructive pulmonary disease: Secondary | ICD-10-CM

## 2014-03-01 DIAGNOSIS — J449 Chronic obstructive pulmonary disease, unspecified: Secondary | ICD-10-CM

## 2014-03-01 MED ORDER — LEVALBUTEROL HCL 0.63 MG/3ML IN NEBU
0.6300 mg | INHALATION_SOLUTION | Freq: Once | RESPIRATORY_TRACT | Status: AC
  Administered 2014-03-01: 0.63 mg via RESPIRATORY_TRACT

## 2014-03-01 NOTE — Patient Instructions (Signed)
Flu vax  [facility]  or self-directed exercise such as walking to maintain your stamina  Nebulizer xopenex  Ask Dr Renold Genta for help with the chronic diarrhea - you may need referral to a gastroenterologist  Stop theophylline for now. See what changes you note with your breathing and with the diarrhea

## 2014-03-01 NOTE — Assessment & Plan Note (Signed)
Frequent loose stools. She says she is reluctant to go out from the house. I doubt theophylline is aggravating this, but we will stop it and she will watch impact on breathing and on diarrhea. Asked her to discuss this problem with her primary physician

## 2014-03-01 NOTE — Assessment & Plan Note (Signed)
Increased dyspnea on exertion, nonspecific. She began noticing around the time weather changed. Plan-try nebulizer treatment with Xopenex. Pay attention this afternoon to whether that provides benefit and lasts. She might do better with a nebulizer machine at home.

## 2014-03-01 NOTE — Assessment & Plan Note (Signed)
I don't know if this condition is related to her unwillingness to participate in pulmonary rehabilitation or to the specific about self-care and exercise plans.

## 2014-03-01 NOTE — Progress Notes (Signed)
07/21/12- 67 F former smoker-Self referral-COPD(pt states dx'd few years ago at [facility]) She complains of dyspnea on exertion, up and down stairs and relieved by rest. Episode of bronchitis at Christmas with shortness of breath. Had done pulmonary rehabilitation. She paces herself. She may wheeze a little. Cough at times, productive of white sputum with no chest pain, blood or fever. Little variation in exercise tolerance from day to day. Breathing does not wake her. Neither Advair nor pro air were much help. She denies history of heart disease, seasonal allergy or asthma. Past history of a pneumonia. Denies anemia, glaucoma or peripheral edema. 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 CXR12/19/13- IMPRESSION:  No pneumonia. Peribronchial thickening most likely represents  bronchitis.  Original Report Authenticated By: Ivar Drape, M.D.  08/30/12- 67 F former smoker-Self referral-COPD(pt states dx'd few years ago at [facility]) [place]: review PFT and 6MW with patient. Denies change since last here. Spiriva sample had no effect. Little cough. She had done pulmonary rehabilitation at [facility]. They let her go to make room in the program for others. a1AT normal 158 no phenotype 6 minute walk test 08/30/2012-92%, 87%, 94%, 297 m. Significant desaturation with exercise. PFT: 08/30/2012- severe obstructive airways disease with response to bronchodilator, air trapping, diffusion severely reduced. FVC 1.96/71%, FEV1 0.89/45%, FEV1/FVC 0.45. RV 134%, DLCO 47%.  11/29/12- 67 F former smoker-Self referral-COPD(pt states dx'd few years ago at [facility]: chooses not to use inhalers-cant tell that they are helping; Continues to stay SOB-worsened with activity; wheezing, slight cough(non productive recently), denies any chest congestion. O2 2l/ [facility] for sleep- using regularly since ONOX Saw no effect limited trial Spiriva or  Symbicort  Had done Pulm Rehab. Educated exercise, progression of COPD.  05/31/13- 67 F former smoker-Self referral-COPD/ severe FOLLOWS FOR: Pt states that she feels her SOB has gotten worse since last; has to stop more often and rest. Sleeps with oxygen 2 L/ [facility] Has done pulmonary rehabilitation. Denies chest pain, swelling. Scant phlegm is clear.  09/29/13- 68 F former smoker-Self referral-COPD/ severe FOLLOWS FOR:  Still having sob with exertion and having to stop and rest often Sleeps with oxygen 2 L/ [facility] No specific spring seasonal problems. She tried a sample of Symbicort but was using it only one puff one time daily and couldn't tell that it helped. CXR 05/31/13 IMPRESSION:  1. Lung hyperexpansion and bronchitic change without acute  cardiopulmonary disease.  2. Sequela of prior granulomatous infection as above.  Electronically Signed  By: Sandi Mariscal M.D.  On: 05/31/2013 10:38   03/01/14- 72 F former smoker- followed for COPD/ chronic bronchitis severe, complicated by hx breast ca, hx shizoaffective disorder                   Son and his wife here Sleeps with oxygen 2 L/ [facility] increased DOE x 1 month.  Wheezing with activity.  No chest tightness/pain. Has felt more short of breath in the past month, and vague and not abrupt. Diarrhea is being treated with Imodium- started before theophylline. Unsure if theophylline helps breathing now but it seemed to at first. She has a number of inhalers at home and cannot tell they help, which is why we try theophylline in the first place.PFT in 2014 shown severe COPD with response to bronchodilator. She denied cough or wheeze but her son and daughter-in-law assured her that she coughs productively every  day. She denies chest pain, fever, edema, palpitation or blood. We discussed her previous experience with pulmonary rehabilitation. She insists she does not want to go back-did not like the regimented environment or monitored exercise.she says she  will be willing to walk more, mentioning only very vague ideas.  ROS-see HPI Constitutional:   No-   weight loss, night sweats, fevers, chills, fatigue, lassitude. HEENT:   No-  headaches, difficulty swallowing, tooth/dental problems, sore throat,       No-  sneezing, itching, ear ache, nasal congestion, post nasal drip,  CV:  No-   chest pain, orthopnea, PND, swelling in lower extremities, anasarca, dizziness, palpitations Resp: +  shortness of breath with exertion or at rest.              No-  productive cough,  + non-productive cough,  No- coughing up of blood.              No-   change in color of mucus.  No- wheezing.   Skin: No-   rash or lesions. GI:  No-   heartburn, indigestion, abdominal pain, nausea, vomiting,  GU:  MS:  No-   joint pain or swelling.   Neuro-     nothing unusual Psych:  No- change in mood or affect. No depression or anxiety.  No memory loss.  OBJ- Physical Exam General- Alert, Oriented, Affect-appropriate, Distress- none acute, overweight Skin- rash-none, lesions- none, excoriation- none Lymphadenopathy- none Head- atraumatic            Eyes- Gross vision intact, PERRLA, conjunctivae and secretions clear            Ears- Hearing, canals-normal            Nose- Clear, no-Septal dev, mucus, polyps, erosion, perforation             Throat- Mallampati II , mucosa clear , drainage- none, tonsils- atrophic, + throat clearing Neck- flexible , trachea midline, no stridor , thyroid nl, carotid no bruit Chest - symmetrical excursion , unlabored           Heart/CV- RRR , no murmur , no gallop  , no rub, nl s1 s2                           - JVD- none , edema- none, stasis changes- none, varices- none           Lung- +diminished, clear, unlabored, wheeze- none, No- cough , dullness-none, rub- none           Chest wall-  Abd-  Br/ Gen/ Rectal- Not done, not indicated Extrem- cyanosis- none, clubbing, none, atrophy- none, strength- nl Neuro- grossly intact to  observation

## 2014-03-02 ENCOUNTER — Encounter (HOSPITAL_COMMUNITY): Payer: Self-pay

## 2014-03-06 ENCOUNTER — Encounter: Payer: Self-pay | Admitting: *Deleted

## 2014-03-06 NOTE — Progress Notes (Signed)
Received Oncotype Dx results of 9.  Placed a copy in Dr. Geralyn Flash box and took a copy to HIM to scan.

## 2014-03-10 ENCOUNTER — Telehealth: Payer: Self-pay | Admitting: Internal Medicine

## 2014-03-10 DIAGNOSIS — J449 Chronic obstructive pulmonary disease, unspecified: Secondary | ICD-10-CM

## 2014-03-10 NOTE — Telephone Encounter (Signed)
lmomtcb X1 

## 2014-03-13 NOTE — Telephone Encounter (Signed)
lmomtcb x 2  

## 2014-03-14 ENCOUNTER — Ambulatory Visit
Admission: RE | Admit: 2014-03-14 | Discharge: 2014-03-14 | Disposition: A | Discharge status: Home / Self Care | Payer: Medicare HMO | Source: Ambulatory Visit | Attending: Radiation Oncology | Admitting: Radiation Oncology

## 2014-03-14 DIAGNOSIS — Z510 Encounter for antineoplastic radiation therapy: Secondary | ICD-10-CM | POA: Insufficient documentation

## 2014-03-14 DIAGNOSIS — C50312 Malignant neoplasm of lower-inner quadrant of left female breast: Secondary | ICD-10-CM | POA: Diagnosis not present

## 2014-03-14 NOTE — Telephone Encounter (Signed)
Ok - order compressor nebulizer for use as directed         Rx xopenex 0.63 neb solution   # 25, 1 every 6 hours as needed, refill prn

## 2014-03-14 NOTE — Progress Notes (Signed)
Radiation Oncology         [phone]) [phone] ________________________________  Name: Summer Williams      MRN: [id number]          Date: 03/14/2014              DOB: 1946/02/19  Optical Surface Tracking Plan:  Since intensity modulated radiotherapy (IMRT) and 3D conformal radiation treatment methods are predicated on accurate and precise positioning for treatment, intrafraction motion monitoring is medically necessary to ensure accurate and safe treatment delivery.  The ability to quantify intrafraction motion without excessive ionizing radiation dose can only be performed with optical surface tracking. Accordingly, surface imaging offers the opportunity to obtain 3D measurements of patient position throughout IMRT and 3D treatments without excessive radiation exposure.  I am ordering optical surface tracking for this patient's upcoming course of radiotherapy. ________________________________ Signature   Reference:   Ursula Alert, J, et al. Surface imaging-based analysis of intrafraction motion for breast radiotherapy patients.Journal of [place], n. 6, nov. 2014. ISSN [id number].   Available at: <http://www.jacmp.org/index.php/jacmp/article/view/4957>.

## 2014-03-14 NOTE — Telephone Encounter (Signed)
LMTCB x 3 

## 2014-03-14 NOTE — Telephone Encounter (Signed)
lmtcb

## 2014-03-14 NOTE — Progress Notes (Signed)
Name: Summer Williams   MRN: [id number]  Date:  03/14/2014  DOB: Sep 01, 1945  Status:outpatient    DIAGNOSIS: Breast cancer.  CONSENT VERIFIED: yes   SET UP: Patient is setup supine   IMMOBILIZATION:  The following immobilization was used:Custom Moldable Pillow, breast board.   NARRATIVE: [name] was brought to the [facility].  Identity was confirmed.  All relevant records and images related to the planned course of therapy were reviewed.  Then, the patient was positioned in a stable reproducible clinical set-up for radiation therapy.  Wires were placed to delineate the clinical extent of breast tissue. A wire was placed on the scar as well.  CT images were obtained.  An isocenter was placed. Skin markings were placed.  The CT images were loaded into the planning software where the target and avoidance structures were contoured.  The radiation prescription was entered and confirmed. The patient was discharged in stable condition and tolerated simulation well.    TREATMENT PLANNING NOTE:  Treatment planning then occurred. I have requested : MLC's, isodose plan, basic dose calculation  I personally designed and supervised the construction of 3 medically necessary complex treatment devices for the protection of critical normal structures including the lungs and contralateral breast as well as the immobilization device which is necessary for set up certainty.   3D simulation was performed with dose volume histogram of the heart, lungs and lumpectomy cavity to be analyzed.

## 2014-03-14 NOTE — Telephone Encounter (Signed)
Pt was seen by Dr. Annamaria Boots on 03/01/14 with the following instructions:   Patient Instructions     Flu vax  [facility] or self-directed exercise such as walking to maintain your stamina  Nebulizer xopenex  Ask Dr Renold Genta for help with the chronic diarrhea - you may need referral to a gastroenterologist  Stop theophylline for now. See what changes you note with your breathing and with the diarrhea   ---------------  Pt was given xopenex tx during OV and reports this helped.  She would like to know if Dr. Annamaria Boots will write rx for this to have at home to use.  If so, she will also need a neb machine.  Dr. Annamaria Boots, pls advise.  Thank you.  [facility]  No Known Allergies  Current Outpatient Prescriptions on File Prior to Visit  Medication Sig Dispense Refill  . atorvastatin (LIPITOR) 40 MG tablet TAKE 1 TABLET BY MOUTH EVERY DAY  90 tablet  1  . Cholecalciferol (VITAMIN D) 2000 UNITS CAPS Take by mouth daily.       . citalopram (CELEXA) 20 MG tablet Take 20 mg by mouth daily.        . Lactobacillus (ACIDOPHILUS PO) Take by mouth daily.      Marland Kitchen loperamide (IMODIUM) 2 MG capsule Take by mouth 2 (two) times daily.       Marland Kitchen OLANZapine (ZYPREXA) 7.5 MG tablet Take 7.5 mg by mouth at bedtime.        Marland Kitchen oxyCODONE-acetaminophen (ROXICET) 5-325 MG per tablet Take 1 tablet by mouth every 4 (four) hours as needed.  30 tablet  0  . Probiotic Product (PROBIOTIC DAILY PO) Take by mouth 2 (two) times daily.       . theophylline (THEODUR) 200 MG 12 hr tablet Take 1 tablet (200 mg total) by mouth 2 (two) times daily. With food  60 tablet  1   No current facility-administered medications on file prior to visit.

## 2014-03-15 NOTE — Telephone Encounter (Signed)
Spoke with pt and advised that Dr Annamaria Boots has ordered [facility] with Xopenex meds.  Pt states that she would like this sent to [facility].  Order placed for [facility] - LM for [facility] to call about providing med for pt.  Epic says that xopenex is not on formulary?

## 2014-03-15 NOTE — Telephone Encounter (Signed)
Lmomtcb x 1 - Does pt still use [facility]?

## 2014-03-16 ENCOUNTER — Telehealth: Payer: Self-pay | Admitting: Hematology and Oncology

## 2014-03-16 ENCOUNTER — Ambulatory Visit (HOSPITAL_BASED_OUTPATIENT_CLINIC_OR_DEPARTMENT_OTHER): Payer: Medicare HMO | Admitting: Hematology and Oncology

## 2014-03-16 VITALS — BP 148/67 | HR 70 | Temp 98.4°F | Resp 18 | Ht 64.0 in | Wt 168.4 lb

## 2014-03-16 DIAGNOSIS — C50312 Malignant neoplasm of lower-inner quadrant of left female breast: Secondary | ICD-10-CM

## 2014-03-16 DIAGNOSIS — C50812 Malignant neoplasm of overlapping sites of left female breast: Secondary | ICD-10-CM

## 2014-03-16 NOTE — Assessment & Plan Note (Signed)
Left breast invasive ductal carcinoma T1 C. 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 I went over the Oncotype DX result with her previously on the phone and I gave her a copy of the report and went to the result and discuss that with her in detail.  Aromatase inhibitor counseling: Patient is a candidate for antiestrogen therapy. I would like to and a bone density test before initiating antiestrogen therapy. If she does not have osteoporosis, I will start her on Arimidex 1 mg daily. Treatment duration is for 5 years. If she has a lot of osteoporosis, we may have to use tamoxifen. I discussed the risks and benefits of anti-estrogen therapy with aromatase inhibitors. These include but not limited to insomnia, hot flashes, mood changes, vaginal dryness, bone density loss, and weight gain. Although rare, serious side effects including endometrial cancer, risk of blood clots were also discussed. We strongly believe that the benefits far outweigh the risks. Patient understands these risks and consented to starting treatment. Planned treatment duration is 5 years.  Patient will stop aromatase inhibitor therapy December 1. I would like to see her in January for sleep assess tolerability to treatment. Bone density has been ordered to be done within the next one to 2 weeks.

## 2014-03-16 NOTE — Progress Notes (Signed)
Patient Care Team: Elby Showers, MD as PCP - General (Internal Medicine) Rulon Eisenmenger, MD as Consulting Physician (Hematology and Oncology) Erroll Luna, MD as Consulting Physician (General Surgery) Thea Silversmith, MD as Consulting Physician (Radiation Oncology)  DIAGNOSIS: Cancer of lower-inner quadrant of left female breast   Primary site: Breast (Left)   Staging method: AJCC 7th Edition   Clinical: Stage IA (T1c, N0, cM0) signed by Rulon Eisenmenger, MD on 01/18/2014  8:41 AM   Summary: Stage IA (T1c, N0, cM0)   SUMMARY OF ONCOLOGIC HISTORY:   Cancer of lower-inner quadrant of left female breast   01/05/2014 Initial Diagnosis Ultrasound-guided core needle biopsy: Cancer of lower-inner quadrant of left female breast, ER 100%, PR 90%, Ki-67: 30%, HER-2 negative (Ratio 1.02), grade 2   01/13/2014 Breast MRI 1.6 cm left breast abnormality, no abnormal lymph nodes.   02/09/2014 Surgery Left breast lumpectomy: Invasive grade 2 ductal carcinoma 1.5 cm with low-grade grade DCIS posterior aspect of the inferior margin close (0.15 cm), 3 SLN negative, Oncotype DX recurrence score 9 low risk (7% ROR)    CHIEF COMPLIANT: Followup after surgery for breast cancer  INTERVAL HISTORY: Summer Williams is a 67 year old Caucasian with above-mentioned history of left breast cancer treated with lumpectomy on 02/09/2014. She had Oncotype DX testing which revealed low risk of recurrence. She is here to discuss the Oncotype DX result as well as to discuss future treatment plan. She is been set up to undergo radiation therapy next Thursday.  REVIEW OF SYSTEMS:   Constitutional: Denies fevers, chills or abnormal weight loss, some alopecia Eyes: Denies blurriness of vision Ears, nose, mouth, throat, and face: Denies mucositis or sore throat Respiratory: Denies cough, dyspnea or wheezes Cardiovascular: Denies palpitation, chest discomfort or lower extremity swelling Gastrointestinal:  Denies nausea, heartburn  or change in bowel habits Skin: Denies abnormal skin rashes Lymphatics: Denies new lymphadenopathy or easy bruising Neurological:Denies numbness, tingling or new weaknesses Behavioral/Psych: Mood is stable, no new changes  Breast: Healing very well from space and surgery All other systems were reviewed with the patient and are negative.  I have reviewed the past medical history, past surgical history, social history and family history with the patient and they are unchanged from previous note.  ALLERGIES:  has No Known Allergies.  MEDICATIONS:  Current Outpatient Prescriptions  Medication Sig Dispense Refill  . atorvastatin (LIPITOR) 40 MG tablet TAKE 1 TABLET BY MOUTH EVERY DAY  90 tablet  1  . Cholecalciferol (VITAMIN D) 2000 UNITS CAPS Take by mouth daily.       . citalopram (CELEXA) 20 MG tablet Take 20 mg by mouth daily.        . Lactobacillus (ACIDOPHILUS PO) Take by mouth daily.      Marland Kitchen loperamide (IMODIUM) 2 MG capsule Take by mouth 2 (two) times daily.       Marland Kitchen OLANZapine (ZYPREXA) 7.5 MG tablet Take 7.5 mg by mouth at bedtime.        . Probiotic Product (PROBIOTIC DAILY PO) Take by mouth 2 (two) times daily.       . theophylline (THEODUR) 200 MG 12 hr tablet Take 1 tablet (200 mg total) by mouth 2 (two) times daily. With food  60 tablet  1   No current facility-administered medications for this visit.    PHYSICAL EXAMINATION: ECOG PERFORMANCE STATUS: 1 - Symptomatic but completely ambulatory  Filed Vitals:   03/16/14 0836  BP: 148/67  Pulse: 70  Temp: 98.4 F (  36.9 C)  Resp: 18   Filed Weights   03/16/14 0836  Weight: 168 lb 6.4 oz (76.386 kg)    GENERAL:alert, no distress and comfortable SKIN: skin color, texture, turgor are normal, no rashes or significant lesions EYES: normal, Conjunctiva are pink and non-injected, sclera clear OROPHARYNX:no exudate, no erythema and lips, buccal mucosa, and tongue normal  NECK: supple, thyroid normal size, non-tender, without  nodularity LYMPH:  no palpable lymphadenopathy in the cervical, axillary or inguinal LUNGS: clear to auscultation and percussion with normal breathing effort HEART: regular rate & rhythm and no murmurs and no lower extremity edema ABDOMEN:abdomen soft, non-tender and normal bowel sounds Musculoskeletal:no cyanosis of digits and no clubbing  NEURO: alert & oriented x 3 with fluent speech, no focal motor/sensory deficits  LABORATORY DATA:  I have reviewed the data as listed   Chemistry      Component Value Date/Time   NA 142 01/18/2014 1233   NA 142 11/11/2013 0948   K 4.2 01/18/2014 1233   K 3.9 11/11/2013 0948   CL 104 11/11/2013 0948   CO2 26 01/18/2014 1233   CO2 28 11/11/2013 0948   BUN 9.7 01/18/2014 1233   BUN 11 11/11/2013 0948   CREATININE 0.8 01/18/2014 1233   CREATININE 0.82 11/11/2013 0948      Component Value Date/Time   CALCIUM 9.9 01/18/2014 1233   CALCIUM 9.5 11/11/2013 0948   ALKPHOS 115 01/18/2014 1233   ALKPHOS 105 11/11/2013 0948   AST 22 01/18/2014 1233   AST 20 11/11/2013 0948   ALT 12 01/18/2014 1233   ALT 15 11/11/2013 0948   BILITOT 0.81 01/18/2014 1233   BILITOT 0.9 11/11/2013 0948       Lab Results  Component Value Date   WBC 5.2 01/18/2014   HGB 14.0 01/18/2014   HCT 42.1 01/18/2014   MCV 91.5 01/18/2014   PLT 110* 01/18/2014   NEUTROABS 2.1 01/18/2014     RADIOGRAPHIC STUDIES: I have personally reviewed the radiology reports and agreed with their findings. No results found.   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 I went over the Oncotype DX result with her previously on the phone and I gave her a copy of the report and went to the result and discuss  that with her in detail.  Aromatase inhibitor counseling: Patient is a candidate for antiestrogen therapy. I would like to and a bone density test before initiating antiestrogen therapy. If she does not have osteoporosis, I will start her on Arimidex 1 mg daily. Treatment duration is for 5 years. If she has a lot of osteoporosis, we may have to use tamoxifen. I discussed the risks and benefits of anti-estrogen therapy with aromatase inhibitors. These include but not limited to insomnia, hot flashes, mood changes, vaginal dryness, bone density loss, and weight gain. Although rare, serious side effects including endometrial cancer, risk of blood clots were also discussed. We strongly believe that the benefits far outweigh the risks. Patient understands these risks and consented to starting treatment. Planned treatment duration is 5 years.  Patient will stop aromatase inhibitor therapy December 1. I would like to see her in January for sleep assess tolerability  to treatment. Bone density has been ordered to be done within the next one to 2 weeks.     Orders Placed This Encounter  Procedures  . DG Bone Density    Standing Status: Future     Number of Occurrences:      Standing Expiration Date: 03/16/2015    Order Specific Question:  Reason for Exam (SYMPTOM  OR DIAGNOSIS REQUIRED)    Answer:  post menopausal starting aromatase inhibitor    Order Specific Question:  Preferred imaging location?    Answer:  [facility]   The patient has a good understanding of the overall plan. she agrees with it. She will call with any problems that may develop before her next visit here.  I spent 20 minutes counseling the patient face to face. The total time spent in the appointment was 25 minutes and more than 50% was on counseling and review of test results    Rulon Eisenmenger, MD 03/16/2014 9:16 AM

## 2014-03-17 DIAGNOSIS — Z510 Encounter for antineoplastic radiation therapy: Secondary | ICD-10-CM | POA: Diagnosis not present

## 2014-03-17 MED ORDER — LEVALBUTEROL HCL 0.63 MG/3ML IN NEBU: 0.6300 mg | INHALATION_SOLUTION | Freq: Four times a day (QID) | RESPIRATORY_TRACT | Status: DC | PRN

## 2014-03-17 NOTE — Telephone Encounter (Signed)
I have sent rx for xopenex to [facility] [address]  Pt aware  Nothing further needed

## 2014-03-17 NOTE — Telephone Encounter (Signed)
Pt wants the [facility] to be called into the [facility] on [address].  Pt asks to be reached at [phone].  Satira Anis

## 2014-03-20 ENCOUNTER — Telehealth: Payer: Self-pay

## 2014-03-20 NOTE — Telephone Encounter (Signed)
Called [facility] to schedule bone density.  Called pt - let her know appt d/t 10/21 at 8 am.  Pt voiced understanding.

## 2014-03-21 ENCOUNTER — Telehealth: Payer: Self-pay | Admitting: Internal Medicine

## 2014-03-21 NOTE — Telephone Encounter (Signed)
Received a fax from [facility] for PA on xopenex 0.63 neb solution. Called [phone] to initiate PA. Spoke with Oto and completed PA over the phone. PA went into clinical review and we will get a decision in 72 hours via fax. I will forward message to Joellen Jersey to follow.  Pt ID# [id number] DX: J44.9

## 2014-03-22 ENCOUNTER — Ambulatory Visit
Admission: RE | Admit: 2014-03-22 | Discharge: 2014-03-22 | Disposition: A | Discharge status: Home / Self Care | Payer: Medicare HMO | Source: Ambulatory Visit | Attending: Hematology and Oncology | Admitting: Hematology and Oncology

## 2014-03-22 DIAGNOSIS — C50312 Malignant neoplasm of lower-inner quadrant of left female breast: Secondary | ICD-10-CM

## 2014-03-22 DIAGNOSIS — Z510 Encounter for antineoplastic radiation therapy: Secondary | ICD-10-CM | POA: Diagnosis not present

## 2014-03-23 ENCOUNTER — Ambulatory Visit
Admission: RE | Admit: 2014-03-23 | Discharge: 2014-03-23 | Disposition: A | Discharge status: Home / Self Care | Payer: Medicare HMO | Source: Ambulatory Visit | Attending: Radiation Oncology | Admitting: Radiation Oncology

## 2014-03-23 DIAGNOSIS — Z510 Encounter for antineoplastic radiation therapy: Secondary | ICD-10-CM | POA: Diagnosis not present

## 2014-03-23 DIAGNOSIS — C50312 Malignant neoplasm of lower-inner quadrant of left female breast: Secondary | ICD-10-CM

## 2014-03-23 MED ORDER — RADIAPLEXRX EX GEL
Freq: Once | CUTANEOUS | Status: AC
  Administered 2014-03-23: 15:00:00 via TOPICAL

## 2014-03-23 MED ORDER — ALRA NON-METALLIC DEODORANT (RAD-ONC)
1.0000 | Freq: Once | TOPICAL | Status: AC
Start: 2014-03-23 — End: 2014-03-23
  Administered 2014-03-23: 1 via TOPICAL

## 2014-03-23 NOTE — Telephone Encounter (Signed)
Denial received on patients Xopenex neb tx. Looks as though patient will need to have her Xopenex neb meds through a Local DME under her Medicare Part B.

## 2014-03-23 NOTE — Progress Notes (Signed)
  Radiation Oncology         [phone]) [phone] ________________________________  Name: Summer Williams MRN: [id number]  Date: 03/23/2014  DOB: 1945-11-05  Simulation Verification Note  Status: outpatient  NARRATIVE: The patient was brought to the treatment unit and placed in the planned treatment position. The clinical setup was verified. Then port films were obtained and uploaded to the radiation oncology medical record software.  The treatment beams were carefully compared against the planned radiation fields. The position location and shape of the radiation fields was reviewed. The targeted volume of tissue appears appropriately covered by the radiation beams. Organs at risk appear to be excluded as planned.  Based on my personal review, I approved the simulation verification. The patient's treatment will proceed as planned.  ------------------------------------------------  Thea Silversmith, MD

## 2014-03-23 NOTE — Addendum Note (Signed)
Encounter addended by: Arlyss Repress, RN on: 03/23/2014  3:19 PM<BR>     Documentation filed: Orders

## 2014-03-23 NOTE — Progress Notes (Signed)
Patient to nursing prior to treatment for skin assessment.Left breast looks good, no further pain, linac 2 informed patient ready for port and treat.Given Radiation Therapy and You Booklet and skin care sheet, alra deodorant and radiaplex.teach back method used in informing about routine and possible side effects of radiation treatment: skin discoloration, fatigue, tenderness and swelling.Knows to apply gel twice daily but not 3 to 4 hours before treatment.Use unscented dove soap, pugh fluids to maintain hydration.Knows not to wear underwire bra and that she will see the doctor weekly on Tuesday.

## 2014-03-23 NOTE — Addendum Note (Signed)
Encounter addended by: Arlyss Repress, RN on: 03/23/2014  3:18 PM<BR>     Documentation filed: Inpatient Patient Education, Notes Section, Chief Complaint Section

## 2014-03-23 NOTE — Addendum Note (Signed)
Encounter addended by: Arlyss Repress, RN on: 03/23/2014  3:30 PM<BR>     Documentation filed: Inpatient MAR

## 2014-03-24 ENCOUNTER — Ambulatory Visit
Admission: RE | Admit: 2014-03-24 | Discharge: 2014-03-24 | Disposition: A | Discharge status: Home / Self Care | Payer: Medicare HMO | Source: Ambulatory Visit | Attending: Radiation Oncology | Admitting: Radiation Oncology

## 2014-03-24 DIAGNOSIS — Z510 Encounter for antineoplastic radiation therapy: Secondary | ICD-10-CM | POA: Diagnosis not present

## 2014-03-27 ENCOUNTER — Ambulatory Visit
Admission: RE | Admit: 2014-03-27 | Discharge: 2014-03-27 | Disposition: A | Discharge status: Home / Self Care | Payer: Medicare HMO | Source: Ambulatory Visit | Attending: Radiation Oncology | Admitting: Radiation Oncology

## 2014-03-27 DIAGNOSIS — Z510 Encounter for antineoplastic radiation therapy: Secondary | ICD-10-CM | POA: Diagnosis not present

## 2014-03-28 ENCOUNTER — Encounter: Payer: Self-pay | Admitting: Radiation Oncology

## 2014-03-28 ENCOUNTER — Ambulatory Visit
Admission: RE | Admit: 2014-03-28 | Discharge: 2014-03-28 | Disposition: A | Discharge status: Home / Self Care | Payer: Medicare HMO | Source: Ambulatory Visit | Attending: Radiation Oncology | Admitting: Radiation Oncology

## 2014-03-28 VITALS — BP 133/77 | HR 81 | Temp 98.3°F | Ht 64.0 in | Wt 169.4 lb

## 2014-03-28 DIAGNOSIS — Z510 Encounter for antineoplastic radiation therapy: Secondary | ICD-10-CM | POA: Diagnosis not present

## 2014-03-28 DIAGNOSIS — C50312 Malignant neoplasm of lower-inner quadrant of left female breast: Secondary | ICD-10-CM

## 2014-03-28 NOTE — Progress Notes (Signed)
Summer Williams has received 4 fractions to her left breast. Her left breast is reddened as i has been since her surgery.  She denies any pain.

## 2014-03-28 NOTE — Progress Notes (Signed)
  Radiation Oncology         (336) [phone] ________________________________  Name: Summer Williams MRN: [id number]  Date: 03/28/2014  DOB: [birth year]  Weekly Radiation Therapy Management  Diagnosis: T1cN0 Invasive Ductal Carcinoma of the left breast    Current Dose: 10 Gy     Planned Dose:  42.5 Gy  Narrative . . . . . . . . The patient presents for routine under treatment assessment.                                   The patient is without complaint.                                 Set-up films were reviewed.                                 The chart was checked. Physical Findings. . .  height is 5\' 4"  (1.626 m) and weight is 169 lb 6.4 oz (76.839 kg). Her temperature is 98.3 F (36.8 C). Her blood pressure is 133/77 and her pulse is 81. . The lungs are clear. The heart has a regular rhythm and rate. Examination of the left breast reveals no significant radiation reaction at this point Impression . . . . . . . The patient is tolerating radiation. Plan . . . . . . . . . . . . Continue treatment as planned.  ________________________________   Blair Promise, PhD, MD

## 2014-03-29 ENCOUNTER — Ambulatory Visit
Admission: RE | Admit: 2014-03-29 | Discharge: 2014-03-29 | Disposition: A | Discharge status: Home / Self Care | Payer: Medicare HMO | Source: Ambulatory Visit | Attending: Radiation Oncology | Admitting: Radiation Oncology

## 2014-03-29 DIAGNOSIS — Z510 Encounter for antineoplastic radiation therapy: Secondary | ICD-10-CM | POA: Diagnosis not present

## 2014-03-30 ENCOUNTER — Other Ambulatory Visit: Payer: Self-pay | Admitting: Hematology and Oncology

## 2014-03-30 ENCOUNTER — Ambulatory Visit
Admission: RE | Admit: 2014-03-30 | Discharge: 2014-03-30 | Disposition: A | Discharge status: Home / Self Care | Payer: Medicare HMO | Source: Ambulatory Visit | Attending: Radiation Oncology | Admitting: Radiation Oncology

## 2014-03-30 DIAGNOSIS — C50312 Malignant neoplasm of lower-inner quadrant of left female breast: Secondary | ICD-10-CM

## 2014-03-30 DIAGNOSIS — Z510 Encounter for antineoplastic radiation therapy: Secondary | ICD-10-CM | POA: Diagnosis not present

## 2014-03-30 MED ORDER — ANASTROZOLE 1 MG PO TABS: 1.0000 mg | ORAL_TABLET | Freq: Every day | ORAL | Status: DC

## 2014-03-31 ENCOUNTER — Ambulatory Visit
Admission: RE | Admit: 2014-03-31 | Discharge: 2014-03-31 | Disposition: A | Discharge status: Home / Self Care | Payer: Medicare HMO | Source: Ambulatory Visit | Attending: Radiation Oncology | Admitting: Radiation Oncology

## 2014-03-31 DIAGNOSIS — Z510 Encounter for antineoplastic radiation therapy: Secondary | ICD-10-CM | POA: Diagnosis not present

## 2014-04-03 ENCOUNTER — Ambulatory Visit
Admission: RE | Admit: 2014-04-03 | Discharge: 2014-04-03 | Disposition: A | Discharge status: Home / Self Care | Payer: Medicare HMO | Source: Ambulatory Visit | Attending: Radiation Oncology | Admitting: Radiation Oncology

## 2014-04-03 ENCOUNTER — Encounter: Payer: Self-pay | Admitting: Radiation Oncology

## 2014-04-03 DIAGNOSIS — Z510 Encounter for antineoplastic radiation therapy: Secondary | ICD-10-CM | POA: Diagnosis not present

## 2014-04-04 ENCOUNTER — Ambulatory Visit
Admission: RE | Admit: 2014-04-04 | Discharge: 2014-04-04 | Disposition: A | Discharge status: Home / Self Care | Payer: Medicare HMO | Source: Ambulatory Visit | Attending: Radiation Oncology | Admitting: Radiation Oncology

## 2014-04-04 ENCOUNTER — Encounter: Payer: Self-pay | Admitting: Radiation Oncology

## 2014-04-04 VITALS — BP 126/63 | HR 88 | Temp 98.3°F | Resp 16 | Wt 170.2 lb

## 2014-04-04 DIAGNOSIS — C50312 Malignant neoplasm of lower-inner quadrant of left female breast: Secondary | ICD-10-CM

## 2014-04-04 DIAGNOSIS — Z510 Encounter for antineoplastic radiation therapy: Secondary | ICD-10-CM | POA: Diagnosis not present

## 2014-04-04 NOTE — Progress Notes (Signed)
Weekly rad txs 9 lt breast completed, erythema, nipple area dry ,flaky,  Skin intact, no pain, uses radiplex daily, appetite good,  Energy level fair stated 3:52 PM

## 2014-04-04 NOTE — Progress Notes (Signed)
Weekly Management Note Current Dose: 22.5  Gy  Projected Dose: 50 Gy   Narrative:  The patient presents for routine under treatment assessment.  CBCT/MVCT images/Port film x-rays were reviewed.  The chart was checked. No complaints.   Physical Findings: Weight: 170 lb 3.2 oz (77.202 kg). Unchanged  Impression:  The patient is tolerating radiation.  Plan:  Continue treatment as planned. Continue radiaplex.

## 2014-04-05 ENCOUNTER — Ambulatory Visit
Admission: RE | Admit: 2014-04-05 | Discharge: 2014-04-05 | Disposition: A | Discharge status: Home / Self Care | Payer: Medicare HMO | Source: Ambulatory Visit | Attending: Radiation Oncology | Admitting: Radiation Oncology

## 2014-04-05 DIAGNOSIS — Z510 Encounter for antineoplastic radiation therapy: Secondary | ICD-10-CM | POA: Diagnosis not present

## 2014-04-06 ENCOUNTER — Ambulatory Visit
Admission: RE | Admit: 2014-04-06 | Discharge: 2014-04-06 | Disposition: A | Discharge status: Home / Self Care | Payer: Medicare HMO | Source: Ambulatory Visit | Attending: Radiation Oncology | Admitting: Radiation Oncology

## 2014-04-06 DIAGNOSIS — Z510 Encounter for antineoplastic radiation therapy: Secondary | ICD-10-CM | POA: Diagnosis not present

## 2014-04-07 ENCOUNTER — Ambulatory Visit
Admission: RE | Admit: 2014-04-07 | Discharge: 2014-04-07 | Disposition: A | Discharge status: Home / Self Care | Payer: Medicare HMO | Source: Ambulatory Visit | Attending: Radiation Oncology | Admitting: Radiation Oncology

## 2014-04-07 DIAGNOSIS — Z510 Encounter for antineoplastic radiation therapy: Secondary | ICD-10-CM | POA: Diagnosis not present

## 2014-04-10 ENCOUNTER — Ambulatory Visit
Admission: RE | Admit: 2014-04-10 | Discharge: 2014-04-10 | Disposition: A | Discharge status: Home / Self Care | Payer: Medicare HMO | Source: Ambulatory Visit | Attending: Radiation Oncology | Admitting: Radiation Oncology

## 2014-04-10 DIAGNOSIS — Z510 Encounter for antineoplastic radiation therapy: Secondary | ICD-10-CM | POA: Diagnosis not present

## 2014-04-11 ENCOUNTER — Ambulatory Visit: Payer: Medicare HMO | Admitting: Radiation Oncology

## 2014-04-11 ENCOUNTER — Ambulatory Visit
Admission: RE | Admit: 2014-04-11 | Discharge: 2014-04-11 | Disposition: A | Discharge status: Home / Self Care | Payer: Medicare HMO | Source: Ambulatory Visit | Attending: Radiation Oncology | Admitting: Radiation Oncology

## 2014-04-11 DIAGNOSIS — Z510 Encounter for antineoplastic radiation therapy: Secondary | ICD-10-CM | POA: Diagnosis not present

## 2014-04-11 DIAGNOSIS — C50312 Malignant neoplasm of lower-inner quadrant of left female breast: Secondary | ICD-10-CM

## 2014-04-11 NOTE — Progress Notes (Signed)
Weekly Management Note Current Dose:  35 Gy  Projected Dose: 50 Gy   Narrative:  The patient presents for routine under treatment assessment.  CBCT/MVCT images/Port film x-rays were reviewed.  The chart was checked. Doing well. No complaints. Saw on tx machine. Using radiaplex.  Physical Findings: Weight:  . Unchanged. Slightly pink skin.   Impression:  The patient is tolerating radiation.  Plan:  Continue treatment as planned. Continue radiaplex.

## 2014-04-12 ENCOUNTER — Ambulatory Visit
Admission: RE | Admit: 2014-04-12 | Discharge: 2014-04-12 | Disposition: A | Discharge status: Home / Self Care | Payer: Medicare HMO | Source: Ambulatory Visit | Attending: Radiation Oncology | Admitting: Radiation Oncology

## 2014-04-12 DIAGNOSIS — Z510 Encounter for antineoplastic radiation therapy: Secondary | ICD-10-CM | POA: Diagnosis not present

## 2014-04-13 ENCOUNTER — Ambulatory Visit
Admission: RE | Admit: 2014-04-13 | Discharge: 2014-04-13 | Disposition: A | Discharge status: Home / Self Care | Payer: Medicare HMO | Source: Ambulatory Visit | Attending: Radiation Oncology | Admitting: Radiation Oncology

## 2014-04-13 ENCOUNTER — Encounter: Payer: Self-pay | Admitting: Radiation Oncology

## 2014-04-13 DIAGNOSIS — Z510 Encounter for antineoplastic radiation therapy: Secondary | ICD-10-CM | POA: Diagnosis not present

## 2014-04-13 NOTE — Progress Notes (Signed)
Patient came to nursing because she noticed some moles underneath her left breast.  Told her that radiation can cause moles to darken.  She verbalized agreement and will notify Dr. Pablo Ledger at her next PUT visit.

## 2014-04-14 ENCOUNTER — Ambulatory Visit
Admission: RE | Admit: 2014-04-14 | Discharge: 2014-04-14 | Disposition: A | Discharge status: Home / Self Care | Payer: Medicare HMO | Source: Ambulatory Visit | Attending: Radiation Oncology | Admitting: Radiation Oncology

## 2014-04-14 DIAGNOSIS — Z510 Encounter for antineoplastic radiation therapy: Secondary | ICD-10-CM | POA: Diagnosis not present

## 2014-04-17 ENCOUNTER — Ambulatory Visit
Admission: RE | Admit: 2014-04-17 | Discharge: 2014-04-17 | Disposition: A | Discharge status: Home / Self Care | Payer: Medicare HMO | Source: Ambulatory Visit | Attending: Radiation Oncology | Admitting: Radiation Oncology

## 2014-04-17 DIAGNOSIS — Z510 Encounter for antineoplastic radiation therapy: Secondary | ICD-10-CM | POA: Diagnosis not present

## 2014-04-18 ENCOUNTER — Ambulatory Visit
Admission: RE | Admit: 2014-04-18 | Discharge: 2014-04-18 | Disposition: A | Discharge status: Home / Self Care | Payer: Medicare HMO | Source: Ambulatory Visit | Attending: Radiation Oncology | Admitting: Radiation Oncology

## 2014-04-18 VITALS — BP 108/70 | HR 90 | Temp 98.1°F | Wt 168.9 lb

## 2014-04-18 DIAGNOSIS — C50312 Malignant neoplasm of lower-inner quadrant of left female breast: Secondary | ICD-10-CM

## 2014-04-18 DIAGNOSIS — Z510 Encounter for antineoplastic radiation therapy: Secondary | ICD-10-CM | POA: Diagnosis not present

## 2014-04-18 NOTE — Telephone Encounter (Signed)
This was taken care of already.

## 2014-04-18 NOTE — Addendum Note (Signed)
Encounter addended by: Arlyss Repress, RN on: 04/18/2014  5:28 PM<BR>     Documentation filed: Notes Section

## 2014-04-18 NOTE — Progress Notes (Signed)
Patient for weekly assessment of radiation to left breast.Skin pink.Question about skin tags and moles under left breast, told her radiation makes more prominent.continue radiaplex twice daily.

## 2014-04-18 NOTE — Progress Notes (Signed)
Weekly Management Note Current Dose:  47.5 Gy  Projected Dose: 50 Gy   Narrative:  The patient presents for routine under treatment assessment.  CBCT/MVCT images/Port film x-rays were reviewed.  The chart was checked. Some irritation of her breast and wondered if nipple would "go back to normal". Also has skin tags and moles that are darkening which she was concerned about.   Physical Findings: Weight: 168 lb 14.4 oz (76.613 kg). Slightly pink skin. A few scattered moles in the treatment field are darker. Nipple/areola is swollen.   Impression:  The patient is tolerating radiation.  Plan:  Continue treatment as planned. Has follow up with Dr. Lindi Adie in December. Is already taking AI. Discussed skin care after treatment. Normalreaction to RT from skin lesions. Follow up in 1 month.

## 2014-04-19 ENCOUNTER — Encounter: Payer: Self-pay | Admitting: Radiation Oncology

## 2014-04-19 ENCOUNTER — Ambulatory Visit
Admission: RE | Admit: 2014-04-19 | Discharge: 2014-04-19 | Disposition: A | Discharge status: Home / Self Care | Payer: Medicare HMO | Source: Ambulatory Visit | Attending: Radiation Oncology | Admitting: Radiation Oncology

## 2014-04-19 DIAGNOSIS — Z510 Encounter for antineoplastic radiation therapy: Secondary | ICD-10-CM | POA: Diagnosis not present

## 2014-04-20 ENCOUNTER — Ambulatory Visit: Payer: Medicare HMO

## 2014-04-21 ENCOUNTER — Ambulatory Visit: Payer: Medicare HMO

## 2014-04-21 ENCOUNTER — Other Ambulatory Visit: Payer: Self-pay | Admitting: Internal Medicine

## 2014-04-24 ENCOUNTER — Ambulatory Visit: Payer: Medicare HMO

## 2014-04-25 ENCOUNTER — Ambulatory Visit: Payer: Medicare HMO

## 2014-04-25 ENCOUNTER — Other Ambulatory Visit: Payer: Self-pay | Admitting: Internal Medicine

## 2014-04-26 ENCOUNTER — Ambulatory Visit: Payer: Medicare HMO

## 2014-04-26 NOTE — Progress Notes (Signed)
  Radiation Oncology         (336) [phone] ________________________________  Name: Summer Williams MRN: [id number]  Date: 04/19/2014  DOB: [birth year]  End of Treatment Note  Diagnosis:   T1cN0 Invasive Ductal Carcinoma of the Left Breast   Indication for treatment:  Curative      Radiation treatment dates:   03/23/2014-04/19/2014  Site/dose:   Left Breast / 42.5 at 2.5 Gy per fraction x 17 fractions  Left Breast Boost/ 7.5 Gy at 2.5 Gy per fraction x 3 fractions  Beams/energy:   Opposed tangents with reduced fields using 6 and 10 MV photons 15 MeV electrons delivered en face  Narrative: The patient tolerated radiation treatment relatively well.  She had minimal dermaitis and no desquamation. She had some darkening of the moles in the treatment field which is normal.   Plan: The patient has completed radiation treatment. The patient will return to radiation oncology clinic for routine followup in one month. I advised them to call or return sooner if they have any questions or concerns related to their recovery or treatment.  ------------------------------------------------  Thea Silversmith, MD

## 2014-05-04 ENCOUNTER — Telehealth: Payer: Self-pay

## 2014-05-04 NOTE — Telephone Encounter (Signed)
LMOVM - checking if patient started anastrazole 12/1.  Pt to call clinic if she has any questions.

## 2014-05-16 ENCOUNTER — Encounter: Payer: Self-pay | Admitting: Internal Medicine

## 2014-05-16 ENCOUNTER — Ambulatory Visit (INDEPENDENT_AMBULATORY_CARE_PROVIDER_SITE_OTHER): Payer: Medicare HMO | Admitting: Internal Medicine

## 2014-05-16 VITALS — BP 110/72 | HR 72 | Temp 98.5°F | Wt 167.0 lb

## 2014-05-16 DIAGNOSIS — R197 Diarrhea, unspecified: Secondary | ICD-10-CM

## 2014-05-16 DIAGNOSIS — Z8709 Personal history of other diseases of the respiratory system: Secondary | ICD-10-CM

## 2014-05-16 DIAGNOSIS — E785 Hyperlipidemia, unspecified: Secondary | ICD-10-CM

## 2014-05-16 DIAGNOSIS — Z853 Personal history of malignant neoplasm of breast: Secondary | ICD-10-CM

## 2014-05-16 LAB — HEPATIC FUNCTION PANEL
ALT: 16 U/L (ref 0–35)
AST: 25 U/L (ref 0–37)
Albumin: 4.5 g/dL (ref 3.5–5.2)
Alkaline Phosphatase: 91 U/L (ref 39–117)
Bilirubin, Direct: 0.2 mg/dL (ref 0.0–0.3)
Indirect Bilirubin: 0.5 mg/dL (ref 0.2–1.2)
TOTAL PROTEIN: 7.1 g/dL (ref 6.0–8.3)
Total Bilirubin: 0.7 mg/dL (ref 0.2–1.2)

## 2014-05-16 LAB — LIPID PANEL
Cholesterol: 129 mg/dL (ref 0–200)
HDL: 54 mg/dL (ref >39)
LDL Cholesterol: 58 mg/dL (ref 0–99)
TRIGLYCERIDES: 87 mg/dL (ref <150)
Total CHOL/HDL Ratio: 2.4 Ratio
VLDL: 17 mg/dL (ref 0–40)

## 2014-05-16 MED ORDER — CIPROFLOXACIN HCL 500 MG PO TABS: 500.0000 mg | ORAL_TABLET | Freq: Two times a day (BID) | ORAL | Status: DC

## 2014-05-16 MED ORDER — METRONIDAZOLE 500 MG PO TABS: 500.0000 mg | ORAL_TABLET | Freq: Two times a day (BID) | ORAL | Status: DC

## 2014-05-16 NOTE — Progress Notes (Signed)
   Subjective:    Patient ID: Summer Williams, female    DOB: 1946-03-31, 68 y.o.   MRN: [id number]  HPI She has a history of breast cancer. Has had left lumpectomy and is on Femara. Doing well. Long-standing history of diarrhea which is been going on for years. Has been taking Imodium before she goes out of the house but lately diarrhea has gotten worse and she's had some accidents. No blood in stool. No fever or chills.    Review of Systems     Objective:   Physical Exam  Constitutional: She is oriented to person, place, and time. She appears well-developed and well-nourished. No distress.  Neck: No JVD present. No thyromegaly present.  Cardiovascular: Normal rate and regular rhythm.   No murmur heard. Pulmonary/Chest: Effort normal. She has wheezes. She has no rales. She exhibits no tenderness.  Scattered inspiratory wheezes  Abdominal: Soft. Bowel sounds are normal. She exhibits no distension and no mass. There is no tenderness. There is no rebound and no guarding.  Lymphadenopathy:    She has no cervical adenopathy.  Neurological: She is alert and oriented to person, place, and time.  Skin: Skin is warm and dry. She is not diaphoretic.  Psychiatric: She has a normal mood and affect. Her behavior is normal. Judgment and thought content normal.  Vitals reviewed.         Assessment & Plan:  Diarrhea-? Irritable bowel versus colitis  Hyperlipidemia  History of breast cancer  COPD  Plan: Cipro 500 mg twice daily for 3 days. Flagyl 500 mg twice daily for 7 days. If not better in 2 weeks, refer to gastroenterologist. Return here in 6 months. Continue taking probiotics.

## 2014-05-16 NOTE — Patient Instructions (Signed)
Take Flagyl 500 mg twice daily for 7 days. Take Cipro 500 mg twice daily for 3 days. Call if not better in 2 weeks. Continue probiotics.

## 2014-05-20 NOTE — Progress Notes (Signed)
Name: Summer Williams   MRN: [id number]  Date:  04/07/14   DOB: 03-29-1946  Status:outpatient    DIAGNOSIS: Left Breast Cancer  CONSENT VERIFIED: yes   SET UP: Patient is setup supine   IMMOBILIZATION:  The following immobilization was used:Custom Moldable Pillow, breast board.   NARRATIVE: Summer Williams underwent complex simulation and treatment planning for her boost treatment today.  Her tumor volume was outlined on the planning CT scan. The depth of her cavity was felt to be appropriate for treatment with electrons    15  MeV electrons will be prescribed to the 100% isodose line.   I personally oversaw and approved the construction of a unique block which will be used for beam modification purposes.  A special port plan is requested.

## 2014-06-08 ENCOUNTER — Ambulatory Visit: Admission: RE | Admit: 2014-06-08 | Payer: Medicare HMO | Source: Ambulatory Visit | Admitting: Radiation Oncology

## 2014-06-13 ENCOUNTER — Ambulatory Visit (HOSPITAL_BASED_OUTPATIENT_CLINIC_OR_DEPARTMENT_OTHER): Payer: PPO | Admitting: Hematology and Oncology

## 2014-06-13 ENCOUNTER — Telehealth: Payer: Self-pay | Admitting: *Deleted

## 2014-06-13 ENCOUNTER — Telehealth: Payer: Self-pay | Admitting: Hematology and Oncology

## 2014-06-13 ENCOUNTER — Telehealth: Payer: Self-pay | Admitting: Internal Medicine

## 2014-06-13 VITALS — BP 120/71 | HR 90 | Temp 98.3°F | Resp 18 | Ht 64.0 in | Wt 166.3 lb

## 2014-06-13 DIAGNOSIS — C50312 Malignant neoplasm of lower-inner quadrant of left female breast: Secondary | ICD-10-CM

## 2014-06-13 DIAGNOSIS — C50112 Malignant neoplasm of central portion of left female breast: Secondary | ICD-10-CM

## 2014-06-13 NOTE — Telephone Encounter (Signed)
per pof to sch pt appt-gave pt copy of sch °

## 2014-06-13 NOTE — Assessment & Plan Note (Signed)
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 Bone density shows normal bones. Density will be done October 2017.  Breast cancer surveillance:  Mammograms once a year along with physical exams  Patient will return back to see [place] in 6 months for follow-up. We will follow-up on the labs being done by her primary care physician.

## 2014-06-13 NOTE — Telephone Encounter (Signed)
Has finished the antibiotic and she's still having diarrhea every day.  She does want you to refer her to a GI doc for the diarrhea please.

## 2014-06-13 NOTE — Telephone Encounter (Signed)
Needs stool studies done. Please have pt come get containers

## 2014-06-13 NOTE — Progress Notes (Signed)
Patient Care Team: Elby Showers, MD as PCP - General (Internal Medicine) Rulon Eisenmenger, MD as Consulting Physician (Hematology and Oncology) Erroll Luna, MD as Consulting Physician (General Surgery) Thea Silversmith, MD as Consulting Physician (Radiation Oncology)  DIAGNOSIS: Cancer of lower-inner quadrant of left female breast   Staging form: Breast, AJCC 7th Edition     Clinical: Stage IA (T1c, N0, cM0) - Signed by Rulon Eisenmenger, MD on 01/18/2014     Pathologic: No stage assigned - Unsigned   SUMMARY OF ONCOLOGIC HISTORY:   Cancer of lower-inner quadrant of left female breast   01/05/2014 Initial Diagnosis Ultrasound-guided core needle biopsy: Cancer of lower-inner quadrant of left female breast, ER 100%, PR 90%, Ki-67: 30%, HER-2 negative (Ratio 1.02), grade 2   01/13/2014 Breast MRI 1.6 cm left breast abnormality, no abnormal lymph nodes.   02/09/2014 Surgery Left breast lumpectomy: Invasive grade 2 ductal carcinoma 1.5 cm with low-grade grade DCIS posterior aspect of the inferior margin close (0.15 cm), 3 SLN negative, Oncotype DX recurrence score 9 low risk (7% ROR)   03/13/2014 - 05/02/2014 Radiation Therapy Adjuvant radiation therapy   06/02/2014 -  Anti-estrogen oral therapy Arimidex 1 mg daily, plan is for 5 years of treatment    CHIEF COMPLIANT: Follow-up on Arimidex  INTERVAL HISTORY: Summer Williams is a 69 year old lady with above-mentioned history of left-sided breast cancer treated with lumpectomy and adjuvant radiation she is currently on Arimidex 1 mg daily. She is tolerating Arimidex extremely well without any major problems or concerns. We had done a bone mineral density which showed that she had normal bones. She had done extremely well from the radiation treatment and has no residual problems.  REVIEW OF SYSTEMS:   Constitutional: Denies fevers, chills or abnormal weight loss Eyes: Denies blurriness of vision Ears, nose, mouth, throat, and face: Denies mucositis or  sore throat Respiratory: Denies cough, dyspnea or wheezes Cardiovascular: Denies palpitation, chest discomfort or lower extremity swelling Gastrointestinal:  Denies nausea, heartburn or change in bowel habits Skin: Denies abnormal skin rashes Lymphatics: Denies new lymphadenopathy or easy bruising Neurological:Denies numbness, tingling or new weaknesses Behavioral/Psych: Mood is stable, no new changes  Breast:  denies any pain or lumps or nodules in either breasts All other systems were reviewed with the patient and are negative.  I have reviewed the past medical history, past surgical history, social history and family history with the patient and they are unchanged from previous note.  ALLERGIES:  has No Known Allergies.  MEDICATIONS:  Current Outpatient Prescriptions  Medication Sig Dispense Refill  . anastrozole (ARIMIDEX) 1 MG tablet Take 1 tablet (1 mg total) by mouth daily. 90 tablet 6  . atorvastatin (LIPITOR) 40 MG tablet TAKE 1 TABLET BY MOUTH EVERY DAY 90 tablet 1  . Cholecalciferol (VITAMIN D) 2000 UNITS CAPS Take by mouth daily.     . citalopram (CELEXA) 20 MG tablet Take 20 mg by mouth daily.      . Lactobacillus (ACIDOPHILUS PO) Take by mouth daily.    Marland Kitchen levalbuterol (XOPENEX) 0.63 MG/3ML nebulizer solution Take 3 mLs (0.63 mg total) by nebulization every 6 (six) hours as needed for wheezing or shortness of breath. 120 mL 12  . loperamide (IMODIUM) 2 MG capsule Take by mouth 2 (two) times daily.     Marland Kitchen OLANZapine (ZYPREXA) 7.5 MG tablet Take 7.5 mg by mouth at bedtime.      . Probiotic Product (PROBIOTIC DAILY PO) Take by mouth 2 (two) times daily.  No current facility-administered medications for this visit.    PHYSICAL EXAMINATION: ECOG PERFORMANCE STATUS: 1 - Symptomatic but completely ambulatory  Filed Vitals:   06/13/14 1540  BP: 120/71  Pulse: 90  Temp: 98.3 F (36.8 C)  Resp: 18   Filed Weights   06/13/14 1540  Weight: 166 lb 4.8 oz (75.433 kg)     GENERAL:alert, no distress and comfortable SKIN: skin color, texture, turgor are normal, no rashes or significant lesions EYES: normal, Conjunctiva are pink and non-injected, sclera clear OROPHARYNX:no exudate, no erythema and lips, buccal mucosa, and tongue normal  NECK: supple, thyroid normal size, non-tender, without nodularity LYMPH:  no palpable lymphadenopathy in the cervical, axillary or inguinal LUNGS: clear to auscultation and percussion with normal breathing effort HEART: regular rate & rhythm and no murmurs and no lower extremity edema ABDOMEN:abdomen soft, non-tender and normal bowel sounds Musculoskeletal:no cyanosis of digits and no clubbing  NEURO: alert & oriented x 3 with fluent speech, no focal motor/sensory deficits BREAST: No palpable masses or nodules in either right or left breasts. No palpable axillary supraclavicular or infraclavicular adenopathy no breast tenderness or nipple discharge.   LABORATORY DATA:  I have reviewed the data as listed   Chemistry      Component Value Date/Time   NA 142 01/18/2014 1233   NA 142 11/11/2013 0948   K 4.2 01/18/2014 1233   K 3.9 11/11/2013 0948   CL 104 11/11/2013 0948   CO2 26 01/18/2014 1233   CO2 28 11/11/2013 0948   BUN 9.7 01/18/2014 1233   BUN 11 11/11/2013 0948   CREATININE 0.8 01/18/2014 1233   CREATININE 0.82 11/11/2013 0948      Component Value Date/Time   CALCIUM 9.9 01/18/2014 1233   CALCIUM 9.5 11/11/2013 0948   ALKPHOS 91 05/16/2014 1154   ALKPHOS 115 01/18/2014 1233   AST 25 05/16/2014 1154   AST 22 01/18/2014 1233   ALT 16 05/16/2014 1154   ALT 12 01/18/2014 1233   BILITOT 0.7 05/16/2014 1154   BILITOT 0.81 01/18/2014 1233       Lab Results  Component Value Date   WBC 5.2 01/18/2014   HGB 14.0 01/18/2014   HCT 42.1 01/18/2014   MCV 91.5 01/18/2014   PLT 110* 01/18/2014   NEUTROABS 2.1 01/18/2014     RADIOGRAPHIC STUDIES: I have personally reviewed the radiology reports and  agreed with their findings. 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 Bone density shows normal bones. Density will be done October 2017.  Breast cancer surveillance:  Mammograms once a year along with physical exams  Patient will return back to see [place] in 6 months for follow-up. We will follow-up on the labs being done by her primary care physician.   No orders of the defined types were placed in this encounter.   The patient has a good understanding of the overall plan. she agrees with it. She will call with any problems that may develop before her next visit here.   Rulon Eisenmenger, MD 06/13/2014 4:02 PM

## 2014-06-14 NOTE — Telephone Encounter (Signed)
Patient returned called informed her we need her to pick up stool sample kit she states she will pick up on 06/15/14

## 2014-06-19 ENCOUNTER — Telehealth: Payer: Self-pay | Admitting: *Deleted

## 2014-06-19 DIAGNOSIS — R197 Diarrhea, unspecified: Secondary | ICD-10-CM

## 2014-06-19 NOTE — Telephone Encounter (Signed)
Patient dropped off stool samples. Samples sent to lab for cultures

## 2014-06-20 LAB — OVA AND PARASITE EXAMINATION: OP: NONE SEEN

## 2014-06-20 LAB — FECAL LACTOFERRIN, QUANT: Lactoferrin: NEGATIVE

## 2014-06-20 LAB — C. DIFFICILE GDH AND TOXIN A/B
C. DIFF TOXIN A/B: NOT DETECTED
C. difficile GDH: NOT DETECTED

## 2014-06-21 ENCOUNTER — Telehealth: Payer: Self-pay | Admitting: *Deleted

## 2014-06-21 DIAGNOSIS — R197 Diarrhea, unspecified: Secondary | ICD-10-CM

## 2014-06-21 NOTE — Telephone Encounter (Signed)
reviewed lab results with patient. Informed patient we have referred her to GI for consultation and treatment

## 2014-06-22 ENCOUNTER — Ambulatory Visit
Admission: RE | Admit: 2014-06-22 | Discharge: 2014-06-22 | Disposition: A | Discharge status: Home / Self Care | Payer: PPO | Source: Ambulatory Visit | Attending: Radiation Oncology | Admitting: Radiation Oncology

## 2014-06-22 DIAGNOSIS — C50312 Malignant neoplasm of lower-inner quadrant of left female breast: Secondary | ICD-10-CM

## 2014-06-22 NOTE — Progress Notes (Signed)
   Department of Radiation Oncology  Phone:  [phone] Fax:        [phone]   Name: Summer Williams MRN: [id number]  DOB: 1945/08/25  Date: 06/22/2014  Follow Up Visit Note  Diagnosis: Cancer of lower-inner quadrant of left female breast   Staging form: Breast, AJCC 7th Edition     Clinical: Stage IA (T1c, N0, cM0) - Signed by Rulon Eisenmenger, MD on 01/18/2014     Pathologic: No stage assigned - Unsigned  Summary and Interval since last radiation: 1 month from   Interval History: Summer Williams presents today for routine followup.  She has healed up well. She is tolerating her arimidex well. She has stable shortness of breath. She is due for mammograms in August.   Physical Exam:  There were no vitals filed for this visit. Pleasant female. No distress. Dry skin over left breast.  IMPRESSION: Summer Williams is a 69 y.o. female s/p radiation to the left breast  PLAN:  She is doing well. We discussed the need for follow up every 4-6 months which she has scheduled.  We discussed the need for yearly mammograms which I have orderd for August.  We discussed the need for sun protection in the treated area.  She can always call me with questions.  I will follow up with her on an as needed basis.    Thea Silversmith, MD

## 2014-06-22 NOTE — Progress Notes (Signed)
Patient here routine follow up completion of radiation to left breast  On 04/19/14.denies pain.Shortness of breath on exertion as she has COPD.Started arimidex in December 2015.Left breast is dry and flaky.Instructed patient to apply lotion with vitamin e.

## 2014-06-23 ENCOUNTER — Ambulatory Visit: Payer: Medicare HMO | Admitting: Radiation Oncology

## 2014-06-23 LAB — STOOL CULTURE

## 2014-07-03 ENCOUNTER — Encounter: Payer: Self-pay | Admitting: Internal Medicine

## 2014-07-03 ENCOUNTER — Ambulatory Visit (INDEPENDENT_AMBULATORY_CARE_PROVIDER_SITE_OTHER)
Admission: RE | Admit: 2014-07-03 | Discharge: 2014-07-03 | Disposition: A | Discharge status: Home / Self Care | Payer: PPO | Source: Ambulatory Visit | Attending: Internal Medicine | Admitting: Internal Medicine

## 2014-07-03 ENCOUNTER — Ambulatory Visit (INDEPENDENT_AMBULATORY_CARE_PROVIDER_SITE_OTHER): Payer: PPO | Admitting: Internal Medicine

## 2014-07-03 VITALS — BP 100/66 | HR 81 | Ht 64.0 in | Wt 166.0 lb

## 2014-07-03 DIAGNOSIS — J449 Chronic obstructive pulmonary disease, unspecified: Secondary | ICD-10-CM

## 2014-07-03 DIAGNOSIS — J441 Chronic obstructive pulmonary disease with (acute) exacerbation: Secondary | ICD-10-CM

## 2014-07-03 NOTE — Patient Instructions (Addendum)
Order- CXR  Dx acute exacerbation of COPD  Ok to try otc nasal saline spray or saline gel to soothe sore nose with cold  Please call as needed

## 2014-07-03 NOTE — Progress Notes (Signed)
07/21/12- 67 F former smoker-Self referral-COPD(pt states dx'd few years ago at [facility]) She complains of dyspnea on exertion, up and down stairs and relieved by rest. Episode of bronchitis at Christmas with shortness of breath. Had done pulmonary rehabilitation. She paces herself. She may wheeze a little. Cough at times, productive of white sputum with no chest pain, blood or fever. Little variation in exercise tolerance from day to day. Breathing does not wake her. Neither Advair nor pro air were much help. She denies history of heart disease, seasonal allergy or asthma. Past history of a pneumonia. Denies anemia, glaucoma or peripheral edema. 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 CXR12/19/13- IMPRESSION:  No pneumonia. Peribronchial thickening most likely represents  bronchitis.  Original Report Authenticated By: Ivar Drape, M.D.  08/30/12- 67 F former smoker-Self referral-COPD(pt states dx'd few years ago at [facility]) [place]: review PFT and 6MW with patient. Denies change since last here. Spiriva sample had no effect. Little cough. She had done pulmonary rehabilitation at [facility]. They let her go to make room in the program for others. a1AT normal 158 no phenotype 6 minute walk test 08/30/2012-92%, 87%, 94%, 297 m. Significant desaturation with exercise. PFT: 08/30/2012- severe obstructive airways disease with response to bronchodilator, air trapping, diffusion severely reduced. FVC 1.96/71%, FEV1 0.89/45%, FEV1/FVC 0.45. RV 134%, DLCO 47%.  11/29/12- 67 F former smoker-Self referral-COPD(pt states dx'd few years ago at [place]: chooses not to use inhalers-cant tell that they are helping; Continues to stay SOB-worsened with activity; wheezing, slight cough(non productive recently), denies any chest congestion. O2 2l/ APS for sleep- using regularly since ONOX Saw no effect limited trial Spiriva or  Symbicort  Had done Pulm Rehab. Educated exercise, progression of COPD.  05/31/13- 67 F former smoker-Self referral-COPD/ severe FOLLOWS FOR: Pt states that she feels her SOB has gotten worse since last; has to stop more often and rest. Sleeps with oxygen 2 L/ APS Has done pulmonary rehabilitation. Denies chest pain, swelling. Scant phlegm is clear.  09/29/13- 68 F former smoker-Self referral-COPD/ severe FOLLOWS FOR:  Still having sob with exertion and having to stop and rest often Sleeps with oxygen 2 L/ APS No specific spring seasonal problems. She tried a sample of Symbicort but was using it only one puff one time daily and couldn't tell that it helped. CXR 05/31/13 IMPRESSION:  1. Lung hyperexpansion and bronchitic change without acute  cardiopulmonary disease.  2. Sequela of prior granulomatous infection as above.  Electronically Signed  By: Sandi Mariscal M.D.  On: 05/31/2013 10:38   03/01/14- 12 F former smoker- followed for COPD/ chronic bronchitis severe, complicated by hx breast ca, hx shizoaffective disorder                   Son and his wife here Sleeps with oxygen 2 L/ APS increased DOE x 1 month.  Wheezing with activity.  No chest tightness/pain. Has felt more short of breath in the past month, and vague and not abrupt. Diarrhea is being treated with Imodium- started before theophylline. Unsure if theophylline helps breathing now but it seemed to at first. She has a number of inhalers at home and cannot tell they help, which is why we try theophylline in the first place.PFT in 2014 shown severe COPD with response to bronchodilator. She denied cough or wheeze but her son and daughter-in-law assured her that she coughs productively every  day. She denies chest pain, fever, edema, palpitation or blood. We discussed her previous experience with pulmonary rehabilitation. She insists she does not want to go back-did not like the regimented environment or monitored exercise.she says she  will be willing to walk more, mentioning only very vague ideas.  05/03/15-68 F former smoker- followed for COPD/ chronic bronchitis severe, complicated by hx L breast ca/ XRT, hx schizoaffective disorder                    Sleeps with oxygen 2 L/ APS FOLLOWS FOR: COPD pt has sob with exertion unchanged from last visit. Pt c/o cough with brownish mucus and wheezing. Pt denies chest tightnesss/congestion. Getting over a cold "not bad", denies need for antibiotic. No fever, purulent or blood. Mentions hx chronic loose stools.  ROS-see HPI Constitutional:   No-   weight loss, night sweats, fevers, chills, fatigue, lassitude. HEENT:   No-  headaches, difficulty swallowing, tooth/dental problems, sore throat,       No-  sneezing, itching, ear ache, nasal congestion, post nasal drip,  CV:  No-   chest pain, orthopnea, PND, swelling in lower extremities, anasarca, dizziness, palpitations Resp: +  shortness of breath with exertion or at rest.              +  productive cough,  + non-productive cough,  No- coughing up of blood.              +   change in color of mucus.  No- wheezing.   Skin: No-   rash or lesions. GI:  No-   heartburn, indigestion, abdominal pain, nausea, vomiting,  GU:  MS:  No-   joint pain or swelling.   Neuro-     nothing unusual Psych:  No- change in mood or affect. No depression or anxiety.  No memory loss.  OBJ- Physical Exam General- Alert, Oriented, Affect-appropriate, Distress- none acute, overweight Skin- rash-none, lesions- none, excoriation- none Lymphadenopathy- none Head- atraumatic            Eyes- Gross vision intact, PERRLA, conjunctivae and secretions clear            Ears- Hearing, canals-normal            Nose- Clear, no-Septal dev, mucus, polyps, erosion, perforation             Throat- Mallampati II , mucosa clear , drainage- none, tonsils- atrophic, + throat clearing Neck- flexible , trachea midline, no stridor , thyroid nl, carotid no bruit Chest -  symmetrical excursion , unlabored           Heart/CV- RRR , no murmur , no gallop  , no rub, nl s1 s2                           - JVD- none , edema- none, stasis changes- none, varices- none           Lung- +diminished, clear, unlabored, wheeze- none, No- cough , dullness-none, rub- none           Chest wall-  Abd-  Br/ Gen/ Rectal- Not done, not indicated Extrem- cyanosis- none, clubbing, none, atrophy- none, strength- nl Neuro- grossly intact to observation

## 2014-07-04 ENCOUNTER — Other Ambulatory Visit: Payer: Self-pay | Admitting: Internal Medicine

## 2014-07-04 DIAGNOSIS — R911 Solitary pulmonary nodule: Secondary | ICD-10-CM

## 2014-07-06 ENCOUNTER — Telehealth: Payer: Self-pay | Admitting: Internal Medicine

## 2014-07-06 ENCOUNTER — Other Ambulatory Visit (INDEPENDENT_AMBULATORY_CARE_PROVIDER_SITE_OTHER): Payer: PPO

## 2014-07-06 DIAGNOSIS — R911 Solitary pulmonary nodule: Secondary | ICD-10-CM

## 2014-07-06 LAB — BASIC METABOLIC PANEL
BUN: 8 mg/dL (ref 6–23)
CHLORIDE: 105 meq/L (ref 96–112)
CO2: 29 meq/L (ref 19–32)
CREATININE: 0.73 mg/dL (ref 0.40–1.20)
Calcium: 9.6 mg/dL (ref 8.4–10.5)
GFR: 83.78 mL/min (ref >60.00)
Glucose, Bld: 108 mg/dL — ABNORMAL HIGH (ref 70–99)
Potassium: 3.9 mEq/L (ref 3.5–5.1)
Sodium: 142 mEq/L (ref 135–145)

## 2014-07-06 NOTE — Assessment & Plan Note (Signed)
Recent acute exacerbation that sounds like viral bronchitis. Since she says she is getting better and denies need for additional intervention, we will watch. Treat supportively, but update CXR given her severe COPD.

## 2014-07-06 NOTE — Telephone Encounter (Signed)
Spoke to pt. Pt had questions regarding the results CXR. Informed pt CY wants a CT chest to further evaluate and once the results of the CT come back then CY will decide a course of action. Pt verbalized understanding and denied any further questions or concerns at this time.

## 2014-07-11 ENCOUNTER — Ambulatory Visit (INDEPENDENT_AMBULATORY_CARE_PROVIDER_SITE_OTHER)
Admission: RE | Admit: 2014-07-11 | Discharge: 2014-07-11 | Disposition: A | Discharge status: Home / Self Care | Payer: PPO | Source: Ambulatory Visit | Attending: Internal Medicine | Admitting: Internal Medicine

## 2014-07-11 DIAGNOSIS — R911 Solitary pulmonary nodule: Secondary | ICD-10-CM

## 2014-07-11 MED ORDER — IOHEXOL 300 MG/ML  SOLN
80.0000 mL | Freq: Once | INTRAMUSCULAR | Status: AC | PRN
  Administered 2014-07-11: 80 mL via INTRAVENOUS

## 2014-07-12 ENCOUNTER — Telehealth: Payer: Self-pay | Admitting: Internal Medicine

## 2014-07-12 DIAGNOSIS — R911 Solitary pulmonary nodule: Secondary | ICD-10-CM

## 2014-07-12 NOTE — Telephone Encounter (Signed)
Called and spoke with pt and she is aware of results of ct per CY.  Pt is aware that we will schedule her appt once her CT has been scheduled. Nothing further is needed at this time.

## 2014-07-12 NOTE — Telephone Encounter (Signed)
See response to CT report in "Results" file

## 2014-07-12 NOTE — Telephone Encounter (Signed)
Dr Annamaria Boots, please advise on chest ct results in Epic.  IMPRESSION: 1. Ill-defined ground-glass density in the right perihilar region involving the upper and middle lobes. This appearance is most consistent with an inflammatory process, possibly radiation-related if applicable. Atypical adenomatous hyperplasia can have this appearance. Initial follow-up by chest CT without contrast is recommended in 3 months to confirm persistence. This recommendation follows the consensus statement: Recommendations for the Management of Subsolid Pulmonary Nodules Detected at CT: A Statement from the [place] as published in Radiology 2013; 266:304-317. 2. Small nonspecific right middle and lower lobe pulmonary nodules. 3. Mild emphysema with central airway thickening. 4. Postsurgical and probable post radiation changes in the left breast. No evidence of chest wall mass or lymphadenopathy.   Electronically Signed  By: Richardean Sale M.D.  On: 07/11/2014 16:06

## 2014-07-12 NOTE — Telephone Encounter (Signed)
Leukocytes not ordered

## 2014-07-18 ENCOUNTER — Telehealth: Payer: Self-pay | Admitting: Internal Medicine

## 2014-07-18 ENCOUNTER — Encounter: Payer: Self-pay | Admitting: Gastroenterology

## 2014-07-18 NOTE — Telephone Encounter (Signed)
Patients appt is Tuesday Feb 23,2016 with Jonnie Kind PA at [facility] at 10:30. Notified patient with appt time.

## 2014-07-18 NOTE — Telephone Encounter (Signed)
Please call GI office for referral today by 5 pm. This was already supposed to be done.

## 2014-07-18 NOTE — Telephone Encounter (Signed)
Patient states she had called asking for a referral to a GI doc and was told she would receive a call back with a referral.  Wants to know if she can please get that referral as she is still having issues with diarrhea and needs that referral please.  Thanks.

## 2014-07-25 ENCOUNTER — Encounter: Payer: Self-pay | Admitting: Gastroenterology

## 2014-07-25 ENCOUNTER — Other Ambulatory Visit (INDEPENDENT_AMBULATORY_CARE_PROVIDER_SITE_OTHER): Payer: PPO

## 2014-07-25 ENCOUNTER — Ambulatory Visit (INDEPENDENT_AMBULATORY_CARE_PROVIDER_SITE_OTHER): Payer: PPO | Admitting: Gastroenterology

## 2014-07-25 VITALS — BP 120/68 | HR 68 | Ht 64.0 in | Wt 169.2 lb

## 2014-07-25 DIAGNOSIS — R197 Diarrhea, unspecified: Secondary | ICD-10-CM

## 2014-07-25 LAB — CBC WITH DIFFERENTIAL/PLATELET
Basophils Absolute: 0 10*3/uL (ref 0.0–0.1)
Basophils Relative: 0.2 % (ref 0.0–3.0)
EOS ABS: 0 10*3/uL (ref 0.0–0.7)
Eosinophils Relative: 0.9 % (ref 0.0–5.0)
HCT: 42 % (ref 36.0–46.0)
Hemoglobin: 14.4 g/dL (ref 12.0–15.0)
LYMPHS PCT: 27.1 % (ref 12.0–46.0)
Lymphs Abs: 1.5 10*3/uL (ref 0.7–4.0)
MCHC: 34.2 g/dL (ref 30.0–36.0)
MCV: 91 fl (ref 78.0–100.0)
Monocytes Absolute: 1.2 10*3/uL — ABNORMAL HIGH (ref 0.1–1.0)
Neutro Abs: 2.7 10*3/uL (ref 1.4–7.7)
Neutrophils Relative %: 49.7 % (ref 43.0–77.0)
PLATELETS: 110 10*3/uL — AB (ref 150.0–400.0)
RBC: 4.61 Mil/uL (ref 3.87–5.11)
RDW: 13.5 % (ref 11.5–15.5)
WBC: 5.4 10*3/uL (ref 4.0–10.5)

## 2014-07-25 LAB — IGA: IgA: 234 mg/dL (ref 68–378)

## 2014-07-25 MED ORDER — CHOLESTYRAMINE 4 G PO PACK
4.0000 g | PACK | Freq: Two times a day (BID) | ORAL | Status: DC
Start: 2014-07-25 — End: 2014-08-22

## 2014-07-25 NOTE — Patient Instructions (Signed)
We have sent  medications to your pharmacy for you to pick up at your convenience. Your physician has requested that you go to the basement for  lab work before leaving today. You can finish the probiotics you have on hand and then start Florastor daily. Follow up with Janett Billow on 08/22/14 at 10 am. CC:  Tedra Senegal MD

## 2014-07-25 NOTE — Progress Notes (Signed)
Reviewed and agree with management.  No colonoscopy I would check a Cologuard Robert D. Deatra Ina, M.D., [facility]

## 2014-07-25 NOTE — Progress Notes (Signed)
Reviewed and agree with management.  No colonoscopy I would check a Cologuard Robert D. Deatra Ina, M.D., [facility]

## 2014-07-25 NOTE — Progress Notes (Signed)
07/25/2014 Summer Williams [id number] Jul 12, 1945   HISTORY OF PRESENT ILLNESS:  This is a 69 year old female who is new to our practice and has been referred here by Dr. Renold Genta, her PCP, for evaluation regarding issues with diarrhea.  She says that she's had some diarrhea for several years, but it has become much worse as far as the urgency and issues with incontinence.  She admits to only usually one BM per day, but it is mostly always loose to watery and is urgent with no warning; says that she's had accidents in public and has to know where all of the bathrooms are when she goes somewhere.  She says that most of the time it occurs after eating, but no always.  She is s/p cholecystectomy.  She says that she was previously on theophylline, but it was discontinued to see if that was causing the diarrhea; had no improvement in the diarrhea after stopping the medication, however.  Feels like anything that she eats will cause diarrhea.  Has never undergone colonoscopy and she says that she does not want one.  She denies any abdominal pain or rectal bleeding.  She says that over the past 6 months she has lost about 20 pounds, but she has tried to change her diet to see if she could manage the diarrhea.  Takes Imodium prn.  She is also taking 5 different probiotics (says that they all have different bacterial strains so she thought they would help).  Recent stool studies were negative for Cdiff, O & P, and enteric pathogens.  TSH was normal in 10/2013.   Past Medical History  Diagnosis Date  . Schizoaffective disorder   . History of alcohol abuse     last 1980  . Hyperlipidemia   . Vitamin D deficiency   . COPD (chronic obstructive pulmonary disease)   . Depression   . Skin cancer     Squamous  . Shortness of breath   . Wears glasses   . Requires supplemental oxygen     at night  . Breast cancer 01/05/14    left breast  . Radiation 03/23/14-04/19/14    Left breast   Past Surgical History    Procedure Laterality Date  . Eye surgery  2005    Cataract OD  . Cholecystectomy  2003  . Dental surgery      implants  . Breast surgery  02/09/2014    Left luimpectomy    reports that she quit smoking about 8 years ago. Her smoking use included Cigarettes. She has a 50 pack-year smoking history. She has never used smokeless tobacco. She reports that she does not drink alcohol or use illicit drugs. family history includes Breast cancer in her maternal aunt; Cholelithiasis in her mother; Heart disease in her father; Leukemia in her paternal grandfather; Lung cancer in her maternal grandfather; Ovarian cancer in her maternal aunt. There is no history of Colon cancer, Colon polyps, Esophageal cancer, Kidney disease, or Gallbladder disease. No Known Allergies    Outpatient Encounter Prescriptions as of 07/25/2014  Medication Sig  . anastrozole (ARIMIDEX) 1 MG tablet Take 1 tablet (1 mg total) by mouth daily.  Marland Kitchen atorvastatin (LIPITOR) 40 MG tablet TAKE 1 TABLET BY MOUTH EVERY DAY  . Cholecalciferol (VITAMIN D) 2000 UNITS CAPS Take by mouth daily.   . citalopram (CELEXA) 20 MG tablet Take 20 mg by mouth daily.    . Lactobacillus (ACIDOPHILUS PO) Take by mouth daily.  Marland Kitchen  levalbuterol (XOPENEX) 0.63 MG/3ML nebulizer solution Take 3 mLs (0.63 mg total) by nebulization every 6 (six) hours as needed for wheezing or shortness of breath.  . loperamide (IMODIUM) 2 MG capsule Take by mouth 2 (two) times daily.   . Multiple Vitamins-Minerals (MULTIVITAMIN GUMMIES ADULT PO) Take by mouth daily. Pt takes 2 gummies, by mouth, every day  . OLANZapine (ZYPREXA) 7.5 MG tablet Take 7.5 mg by mouth at bedtime.    . Omega-3 Fatty Acids (FISH OIL) 1200 MG CAPS Take 1 capsule by mouth daily.  . Probiotic Product (PROBIOTIC DAILY PO) Take by mouth daily. Pt takes the following: 1. Align, 1 tablet, by mouth, every day 2. Probiotic gummy - 1 gummy, by mouth, every day ([facility] brand) 3. [facility] Brand -  Probiotic - 1 tablet, by mouth, every day 4. Acidophilus Probiotic - 1 tablet, by mouth, every day 5. Adults Probiotic - 1 tablet, by mouth, every day (Flora brand)  . cholestyramine (QUESTRAN) 4 G packet Take 1 packet (4 g total) by mouth 2 (two) times daily.     REVIEW OF SYSTEMS  : All other systems reviewed and negative except where noted in the History of Present Illness.   PHYSICAL EXAM: BP 120/68 mmHg  Pulse 68  Ht 5\' 4"  (1.626 m)  Wt 169 lb 4 oz (76.771 kg)  BMI 29.04 kg/m2 General: Well developed white female in no acute distress Head: Normocephalic and atraumatic Eyes:  Sclerae anicteric, conjunctiva pink. Ears: Normal auditory acuity Lungs: Clear throughout to auscultation Heart: Regular rate and rhythm Abdomen: Soft, non-distended.  Normal bowel sounds.  Non-tender. Musculoskeletal: Symmetrical with no gross deformities  Skin: No lesions on visible extremities Extremities: No edema  Neurological: Alert oriented x 4, grossly non-focal Psychological:  Alert and cooperative. Normal mood and affect  ASSESSMENT AND PLAN: -Diarrhea:  Differential includes IBS vs bile salt diarrhea post cholecystectomy vs microscopic colitis vs celiac disease, etc.  Patient has never undergone colonoscopy in the past and has declined the procedure at this time.  She says that she will think about it.  For now, we will check celiac labs.  We will try questran once or twice daily.  She can continue a daily probiotic, however, I have advised her that it is not necessary to take 5 different probiotics at one time.  She will follow-up in about 4 weeks to reassess; she will think about colonoscopy in the interim.  Just of note, she is on nocturnal O2 so procedure would need to be done at the hospital.    CC:  Dr. Renold Genta

## 2014-07-26 LAB — TISSUE TRANSGLUTAMINASE, IGA: TISSUE TRANSGLUTAMINASE AB, IGA: 1 U/mL (ref <4)

## 2014-08-22 ENCOUNTER — Encounter: Payer: Self-pay | Admitting: Gastroenterology

## 2014-08-22 ENCOUNTER — Other Ambulatory Visit: Payer: Self-pay | Admitting: *Deleted

## 2014-08-22 ENCOUNTER — Ambulatory Visit (INDEPENDENT_AMBULATORY_CARE_PROVIDER_SITE_OTHER): Payer: PPO | Admitting: Gastroenterology

## 2014-08-22 VITALS — BP 116/70 | HR 80 | Ht 64.0 in | Wt 166.4 lb

## 2014-08-22 DIAGNOSIS — R197 Diarrhea, unspecified: Secondary | ICD-10-CM

## 2014-08-22 DIAGNOSIS — Z1211 Encounter for screening for malignant neoplasm of colon: Secondary | ICD-10-CM | POA: Insufficient documentation

## 2014-08-22 MED ORDER — CHOLESTYRAMINE 4 G PO PACK: 4.0000 g | PACK | Freq: Two times a day (BID) | ORAL | Status: DC

## 2014-08-22 NOTE — Patient Instructions (Signed)
[employer] will contact you regarding Cologuard  Please cont. Taking Questran

## 2014-08-22 NOTE — Progress Notes (Signed)
     08/22/2014 Summer Williams [id number] 05-Apr-1946   History of Present Illness:  This is a pleasant 69 year old female who is here for follow-up of her diarrhea.  She was seen by myself on 07/25/2014 for this issues.  Celiac labs were negative.  She was started on questran BID and states that it has worked very well for her.  No diarrhea since she has been taking it.  She has never had colonoscopy and we discussed this at her last visit, but she declined.  She said that she would think about it, but continues to decline the procedure today as well.  We discussed the option of Cologuard, which she did agree to.   Current Medications, Allergies, Past Medical History, Past Surgical History, Family History and Social History were reviewed in [employer] record.   Physical Exam: BP 116/70 mmHg  Pulse 80  Ht 5\' 4"  (1.626 m)  Wt 166 lb 6 oz (75.467 kg)  BMI 28.54 kg/m2 General: Well developed white female in no acute distress Head: Normocephalic and atraumatic Eyes:  Sclerae anicteric, conjunctiva pink  Ears: Normal auditory acuity Lungs: Clear throughout to auscultation Heart: Regular rate and rhythm Abdomen: Soft, non-distended.  Normal bowel sounds.  Non-tender. Musculoskeletal: Symmetrical with no gross deformities  Extremities: No edema  Neurological: Alert oriented x 4, grossly non-focal Psychological:  Alert and cooperative. Normal mood and affect  Assessment and Recommendations: -Diarrhea:  Differential includes IBS vs bile salt diarrhea post cholecystectomy vs microscopic colitis, etc.  Celiac labs negative.  Symptoms significantly improved on questran BID.  Will continue. -Colon cancer screening:  Patient has never undergone colonoscopy in the past and continues to decline having the procedure at this time.  We discussed Cologuard and she is agreeable to that; she is aware if negative then we can avoid colonoscopy, but if positive then would require  follow-up colonoscopy.  Just of note, she is on nocturnal O2 so if colonoscopy needs to be performed then would need to be done at the hospital.    CC:  Dr. Renold Genta

## 2014-08-23 NOTE — Progress Notes (Signed)
Reviewed and agree with management. Reigan Tolliver D. Zoe Creasman, M.D., FACG  

## 2014-09-07 LAB — COLOGUARD: COLOGUARD: POSITIVE

## 2014-09-14 ENCOUNTER — Telehealth: Payer: Self-pay

## 2014-09-14 NOTE — Telephone Encounter (Signed)
Recommend screening colonoscopy on the basis of a positive Cologuard

## 2014-09-14 NOTE — Telephone Encounter (Signed)
Received call from [employer] with positive cologuard test result. Report being faxed over. Dr. Deatra Ina notified.

## 2014-10-02 ENCOUNTER — Other Ambulatory Visit: Payer: Self-pay

## 2014-10-02 ENCOUNTER — Telehealth: Payer: Self-pay

## 2014-10-02 DIAGNOSIS — Z1211 Encounter for screening for malignant neoplasm of colon: Secondary | ICD-10-CM

## 2014-10-02 NOTE — Telephone Encounter (Signed)
Left message for the patient to call back.

## 2014-10-02 NOTE — Assessment & Plan Note (Signed)
Recommend colonoscopy

## 2014-10-03 NOTE — Telephone Encounter (Signed)
I have left message for the patient to call back. Pre-visit on 10/26/14 at 1:00 pm Colonoscopy 11/08/14 at 12:30, arrive at 11:00 [facility]

## 2014-10-04 NOTE — Telephone Encounter (Signed)
Patient leaves message that she received the information and is agreeable to these dates

## 2014-10-11 ENCOUNTER — Ambulatory Visit (INDEPENDENT_AMBULATORY_CARE_PROVIDER_SITE_OTHER)
Admission: RE | Admit: 2014-10-11 | Discharge: 2014-10-11 | Disposition: A | Discharge status: Home / Self Care | Payer: PPO | Source: Ambulatory Visit | Attending: Internal Medicine | Admitting: Internal Medicine

## 2014-10-11 DIAGNOSIS — R911 Solitary pulmonary nodule: Secondary | ICD-10-CM | POA: Diagnosis not present

## 2014-10-12 ENCOUNTER — Telehealth: Payer: Self-pay

## 2014-10-12 ENCOUNTER — Telehealth: Payer: Self-pay | Admitting: *Deleted

## 2014-10-12 NOTE — Telephone Encounter (Signed)
Informed patient that she still has an abnormal nodule in her right lung and that she will be set up to be seen in [place] .Informed that Hinton Dyer will notify her.Patient attempted to ask more questions " are they going to have to open my rib-cage for biopsy?" I reassured her that all her questions will be answered during the clinic.

## 2014-10-12 NOTE — Telephone Encounter (Signed)
CALLED PATIENT TO INFORM OF INFO. PER DR. Pablo Ledger, LVM FOR A RETURN CALL

## 2014-10-13 ENCOUNTER — Telehealth: Payer: Self-pay | Admitting: *Deleted

## 2014-10-13 ENCOUNTER — Telehealth: Payer: Self-pay | Admitting: Oncology

## 2014-10-13 NOTE — Telephone Encounter (Signed)
Summer Williams called to see if an appointment had been made for the lung clinic next week.  She said she has missed Summer Williams's calls.  Advised her that Summer Williams will be notified and she should receive a call early next week.  Summer Williams verbalized agreement.

## 2014-10-13 NOTE — Telephone Encounter (Signed)
Per Dr. Unknown Jim request, I called patient to set up for thoracic clinic next week.  I called and left a vm message with my name and phone number

## 2014-10-16 ENCOUNTER — Telehealth: Payer: Self-pay | Admitting: *Deleted

## 2014-10-16 ENCOUNTER — Other Ambulatory Visit: Payer: Self-pay | Admitting: Internal Medicine

## 2014-10-16 NOTE — Telephone Encounter (Signed)
Called to arrange for thoracic clinic.  Patient is scheduled to see [facility] on 10/19/14 arrive at 2:30.  She verbalized understanding of appt time and place.

## 2014-10-19 ENCOUNTER — Encounter: Payer: PPO | Admitting: Thoracic Surgery (Cardiothoracic Vascular Surgery)

## 2014-10-20 ENCOUNTER — Encounter: Payer: Self-pay | Admitting: Thoracic Surgery (Cardiothoracic Vascular Surgery)

## 2014-10-20 ENCOUNTER — Institutional Professional Consult (permissible substitution) (INDEPENDENT_AMBULATORY_CARE_PROVIDER_SITE_OTHER): Payer: PPO | Admitting: Thoracic Surgery (Cardiothoracic Vascular Surgery)

## 2014-10-20 VITALS — BP 127/71 | HR 73 | Resp 18 | Ht 64.0 in | Wt 169.0 lb

## 2014-10-20 DIAGNOSIS — R918 Other nonspecific abnormal finding of lung field: Secondary | ICD-10-CM

## 2014-10-20 DIAGNOSIS — Z853 Personal history of malignant neoplasm of breast: Secondary | ICD-10-CM | POA: Diagnosis not present

## 2014-10-20 DIAGNOSIS — J432 Centrilobular emphysema: Secondary | ICD-10-CM | POA: Diagnosis not present

## 2014-10-20 NOTE — Progress Notes (Signed)
PCP is Elby Showers, MD Referring Provider is Thea Silversmith, MD  Chief Complaint  Patient presents with  . Lung Lesion    s/p breast cancer/radiation,,,CT CHEST 10/11/14    HPI: 69 year old former smoker sent for consultation regarding a right lung mass.  Summer Williams is a 69 year old woman with a 90-pack-year history of smoking, COPD, history of schizoaffective disorder, history of early stage breast cancer, and hyperlipidemia. She saw Dr. Annamaria Boots in February in regards to her COPD. A chest x-ray was done which showed an opacity in the right lung. A CT of the chest was done which showed a 3.9 x 3.4 by 2.3 cm groundglass opacity centered on the minor fissure. A repeat CT was done on 10/11/2014. The lesion was still present and possibly slightly larger although probably within the margin of error.  She smoked about 2 packs a day from age 21 until age 28. She was diagnosed with COPD several years ago. She has a prescription for Xopenex nebulizers, but does not use them on a regular basis. She does get short of breath with exertion, but says she can walk up a flight of stairs without stopping. She's lost about 15 pounds over the past year but has not lost any weight over the past 3 months. Her appetite is good. She was treated for breast cancer in the fall of 2015 with lumpectomy and radiation. She is on Arimidex for that currently. She said a screening recently showed blood in her stool and she scheduled to have a colonoscopy sometime in June.  Zubrod Score: At the time of surgery this patient's most appropriate activity status/level should be described as: '[]'$     0    Normal activity, no symptoms '[x]'$     1    Restricted in physical strenuous activity but ambulatory, able to do out light work '[]'$     2    Ambulatory and capable of self care, unable to do work activities, up and about >50 % of waking hours                              '[]'$     3    Only limited self care, in bed greater than 50% of  waking hours '[]'$     4    Completely disabled, no self care, confined to bed or chair '[]'$     5    Moribund    Past Medical History  Diagnosis Date  . Schizoaffective disorder   . History of alcohol abuse     last 1980  . Hyperlipidemia   . Vitamin D deficiency   . COPD (chronic obstructive pulmonary disease)   . Depression   . Skin cancer     Squamous  . Shortness of breath   . Wears glasses   . Requires supplemental oxygen     at night  . Breast cancer 01/05/14    left breast  . Radiation 03/23/14-04/19/14    Left breast    Past Surgical History  Procedure Laterality Date  . Eye surgery  2005    Cataract OD  . Cholecystectomy  2003  . Dental surgery      implants  . Breast surgery  02/09/2014    Left luimpectomy    Family History  Problem Relation Age of Onset  . Heart disease Father   . Lung cancer Maternal Grandfather   . Leukemia Paternal Grandfather   . Ovarian cancer Maternal  Aunt   . Breast cancer Maternal Aunt   . Colon cancer Neg Hx   . Colon polyps Neg Hx   . Esophageal cancer Neg Hx   . Kidney disease Neg Hx   . Gallbladder disease Neg Hx   . Cholelithiasis Mother     Social History History  Substance Use Topics  . Smoking status: Former Smoker -- 1.00 packs/day for 50 years    Types: Cigarettes    Quit date: 04/10/2006  . Smokeless tobacco: Never Used  . Alcohol Use: No     Comment: hx abuse-last 1980    Current Outpatient Prescriptions  Medication Sig Dispense Refill  . anastrozole (ARIMIDEX) 1 MG tablet Take 1 tablet (1 mg total) by mouth daily. 90 tablet 6  . atorvastatin (LIPITOR) 40 MG tablet TAKE 1 TABLET BY MOUTH EVERY DAY 90 tablet 1  . Cholecalciferol (VITAMIN D) 2000 UNITS CAPS Take by mouth daily.     . cholestyramine (QUESTRAN) 4 G packet Take 1 packet (4 g total) by mouth 2 (two) times daily. 180 each 3  . citalopram (CELEXA) 20 MG tablet Take 20 mg by mouth daily.      Marland Kitchen levalbuterol (XOPENEX) 0.63 MG/3ML nebulizer solution  Take 3 mLs (0.63 mg total) by nebulization every 6 (six) hours as needed for wheezing or shortness of breath. 120 mL 12  . Multiple Vitamins-Minerals (MULTIVITAMIN GUMMIES ADULT PO) Take by mouth daily. Pt takes 2 gummies, by mouth, every day    . OLANZapine (ZYPREXA) 7.5 MG tablet Take 7.5 mg by mouth at bedtime.      . Omega-3 Fatty Acids (FISH OIL) 1200 MG CAPS Take 1 capsule by mouth daily.    . Probiotic Product (PROBIOTIC DAILY PO) Take by mouth daily. Pt takes the following: 1. Align, 1 tablet, by mouth, every day 2. Probiotic gummy - 2 gummies, by mouth, every day ([facility] brand) 3. [facility] Brand - Probiotic - 1 tablet, by mouth, every day 4. Acidophilus Probiotic - 1 tablet, by mouth, every day 5. Florastor-1 tab twice a day    . Lactobacillus (ACIDOPHILUS PO) Take by mouth daily.     No current facility-administered medications for this visit.    No Known Allergies  Review of Systems  Constitutional: Negative for activity change, appetite change and unexpected weight change.  Respiratory: Positive for shortness of breath. Negative for wheezing. Cough: occasional.        Home O2 at night  Gastrointestinal: Positive for diarrhea and blood in stool (micro).  Psychiatric/Behavioral: Positive for dysphoric mood.       Schizoaffective disorder  All other systems reviewed and are negative.   BP 127/71 mmHg  Pulse 73  Resp 18  Ht '5\' 4"'$  (1.626 m)  Wt 169 lb (76.658 kg)  BMI 28.99 kg/m2  SpO2 96% Physical Exam  Constitutional: She is oriented to person, place, and time. She appears well-developed and well-nourished. No distress.  HENT:  Head: Normocephalic and atraumatic.  Eyes: EOM are normal. Pupils are equal, round, and reactive to light.  Neck: Neck supple. No thyromegaly present.  Cardiovascular: Normal rate and regular rhythm.  Exam reveals no friction rub.   No murmur heard. Pulmonary/Chest: Breath sounds normal. She has no wheezes. She has no rales.   Abdominal: Soft. There is no tenderness.  Musculoskeletal: She exhibits no edema.  Lymphadenopathy:    She has no cervical adenopathy.  Neurological: She is alert and oriented to person, place, and time. She has  normal strength. No cranial nerve deficit. She exhibits normal muscle tone.  Skin: Skin is warm and dry.  Psychiatric: She has a normal mood and affect.  Vitals reviewed.    Diagnostic Tests: CT CHEST WITHOUT CONTRAST  TECHNIQUE: Multidetector CT imaging of the chest was performed following the standard protocol without IV contrast.  COMPARISON: Chest CT 07/11/2014. No other comparison CT.  FINDINGS: Mediastinum/Nodes: There are no enlarged mediastinal, hilar, internal mammary or axillary lymph nodes.There is a stable small hiatal hernia. The heart size is normal. There is no pericardial effusion.There is atherosclerosis of the aorta, great vessels and coronary arteries.  Lungs/Pleura: There is no pleural effusion. There is a persistent irregular ground-glass density involving the right middle and lower lobes. This is difficult to measure on the axial images given its irregular shape, although measures approximately 3.4 x 2.4 cm on image 28. This lesion measures 4.2 cm on sagittal image 29 compared with 3.9 cm previously. There are minimal soft tissue components anteriorly. Underlying emphysema, central airway thickening and scattered nodularity in the right middle and lower lobes is stable. No other suspicious nodules demonstrated.  Upper abdomen: Stable appearance. 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  Musculoskeletal/Chest wall: Postsurgical changes in the left breast with skin thickening and subcutaneous edema are similar to the prior study. No focal chest wall mass or suspicious osseous lesion identified.  IMPRESSION: 1. Persistent irregular predominately ground-glass density in the right middle and lower lobes. This process may be  minimally larger with soft tissue components anteriorly. The persistence excludes an acute inflammatory process. Although potentially scarring from previous inflammation, this finding is now more concerning for adenomatous hyperplasia or early adenocarcinoma. Thoracic surgery consultation is recommended. PET/CT should be considered for staging purposes. These recommendations are taken from: Recommendations for the Management of Subsolid Pulmonary Nodules Detected at CT: A Statement from the [facility] 2013; 266:1, 304-317. 2. No evidence of metastatic breast cancer. 3. Stable emphysema and scattered subpleural nodularity in the right middle and lower lobes. 4. Stable probable radiation changes in the left breast.   Electronically Signed  By: Richardean Sale M.D.  On: 10/11/2014 14:57  Pulmonary function test 08/30/2012 FVC 1.69 (61%), post bronchodilator 1.96 (71%) FEV1 0.78 (40%), post bronchodilator 0.89 (45%) Vital capacity 1.82 (66%) DLCO 47% predicted  Impression: 69 year old woman with a history of tobacco abuse who has a spiculated groundglass opacity in the right upper and middle lobes centered on the minor fissure. There does appear to be a small soft tissue component as well. This is highly suspicious for an adenocarcinoma with lepidic growth. It also could be infectious or inflammatory in nature. However given the appearance of the lesion it needs to be considered lung cancer unless it can be proven otherwise.  I had a long discussion with Mrs. Ho and her son and daughter-in-law. I reviewed the CT images personally and concur with the radiologist's reading. I reviewed the images with the patient and her family. We discussed some of the technical aspects of her particular lesion. It crosses the fissure and is relatively central. Resection would most likely require right upper and middle bilobectomy. It does appear to originate from an upper lobe  bronchus, so a portion of the middle lobe might be potentially spared. Regardless her pulmonary function testing in 2014 I do not think she would be a candidate for surgical resection.  I emphasized the importance of establishing a diagnosis. I think the best way to do that in her case  is with a navigational bronchoscopy. There is an airway leading directly into the lesion so we should be able to get good samples. I did discuss the alternative of a CT-guided biopsy  I discussed the general nature of the procedure. We would do it in the operating room under general anesthesia. This is an endoscopic procedure and does not require an incision. We would do it on an outpatient basis. I informed her of the indications, risks, benefits, and alternatives. She understands that the risks include, but are not limited to death, MI, DVT PE, bleeding, pneumothorax, failure to make a diagnosis.  She and her family had a numerous questions about treatment options including chemotherapy, radiation therapy, and surgery. I answered those the best my ability, however I stressed that we'll have enough information to make any definitive determination at this time.  Plan: 1. Pulmonary function testing with and without bronchodilators  2. Electromagnetic navigational bronchoscopy to biopsy the right upper lobe lesion on Friday, 11/10/2014  Melrose Nakayama, MD [facility] [phone]  I spent 45 minutes face to face with Mrs. Santa

## 2014-10-23 ENCOUNTER — Other Ambulatory Visit: Payer: Self-pay | Admitting: *Deleted

## 2014-10-23 DIAGNOSIS — R918 Other nonspecific abnormal finding of lung field: Secondary | ICD-10-CM

## 2014-10-26 ENCOUNTER — Ambulatory Visit (AMBULATORY_SURGERY_CENTER): Payer: Self-pay | Admitting: *Deleted

## 2014-10-26 VITALS — Ht 64.0 in | Wt 168.2 lb

## 2014-10-26 DIAGNOSIS — Z1211 Encounter for screening for malignant neoplasm of colon: Secondary | ICD-10-CM

## 2014-10-26 MED ORDER — NA SULFATE-K SULFATE-MG SULF 17.5-3.13-1.6 GM/177ML PO SOLN: ORAL | Status: DC

## 2014-10-26 NOTE — Progress Notes (Signed)
No egg or soy allergy  No anesthesia or intubation problems per pt  No diet medications taken  Registered in [place]  Wears oxygen at night- being done at [facility]

## 2014-11-01 ENCOUNTER — Encounter: Payer: Self-pay | Admitting: Gastroenterology

## 2014-11-02 ENCOUNTER — Ambulatory Visit (HOSPITAL_COMMUNITY)
Admission: RE | Admit: 2014-11-02 | Discharge: 2014-11-02 | Disposition: A | Discharge status: Home / Self Care | Payer: PPO | Source: Ambulatory Visit | Attending: Thoracic Surgery (Cardiothoracic Vascular Surgery) | Admitting: Thoracic Surgery (Cardiothoracic Vascular Surgery)

## 2014-11-02 DIAGNOSIS — R918 Other nonspecific abnormal finding of lung field: Secondary | ICD-10-CM | POA: Diagnosis present

## 2014-11-02 LAB — PULMONARY FUNCTION TEST
DL/VA % pred: 70 %
DL/VA: 3.39 ml/min/mmHg/L
DLCO unc % pred: 30 %
DLCO unc: 7.35 ml/min/mmHg
FEF 25-75 PRE: 0.24 L/s
FEF 25-75 Post: 0.5 L/sec
FEF2575-%Change-Post: 108 %
FEF2575-%PRED-PRE: 12 %
FEF2575-%Pred-Post: 26 %
FEV1-%Change-Post: 34 %
FEV1-%PRED-PRE: 29 %
FEV1-%Pred-Post: 39 %
FEV1-Post: 0.9 L
FEV1-Pre: 0.67 L
FEV1FVC-%Change-Post: 1 %
FEV1FVC-%Pred-Pre: 54 %
FEV6-%CHANGE-POST: 28 %
FEV6-%PRED-POST: 64 %
FEV6-%PRED-PRE: 50 %
FEV6-POST: 1.83 L
FEV6-Pre: 1.43 L
FEV6FVC-%Change-Post: -3 %
FEV6FVC-%PRED-POST: 89 %
FEV6FVC-%Pred-Pre: 92 %
FVC-%CHANGE-POST: 32 %
FVC-%PRED-POST: 71 %
FVC-%PRED-PRE: 54 %
FVC-Post: 2.15 L
FVC-Pre: 1.62 L
POST FEV6/FVC RATIO: 85 %
Post FEV1/FVC ratio: 42 %
Pre FEV1/FVC ratio: 41 %
Pre FEV6/FVC Ratio: 88 %
RV % PRED: 138 %
RV: 3.03 L
TLC % pred: 96 %
TLC: 4.89 L

## 2014-11-02 MED ORDER — ALBUTEROL SULFATE (2.5 MG/3ML) 0.083% IN NEBU
2.5000 mg | INHALATION_SOLUTION | Freq: Once | RESPIRATORY_TRACT | Status: AC
  Administered 2014-11-02: 2.5 mg via RESPIRATORY_TRACT

## 2014-11-03 ENCOUNTER — Encounter (HOSPITAL_COMMUNITY)
Admission: RE | Admit: 2014-11-03 | Discharge: 2014-11-03 | Disposition: A | Discharge status: Home / Self Care | Payer: PPO | Source: Ambulatory Visit | Attending: Thoracic Surgery (Cardiothoracic Vascular Surgery) | Admitting: Thoracic Surgery (Cardiothoracic Vascular Surgery)

## 2014-11-03 ENCOUNTER — Telehealth: Payer: Self-pay | Admitting: *Deleted

## 2014-11-03 ENCOUNTER — Encounter (HOSPITAL_COMMUNITY): Payer: Self-pay

## 2014-11-03 VITALS — BP 114/67 | HR 86 | Temp 98.4°F | Resp 18 | Ht 64.0 in | Wt 169.1 lb

## 2014-11-03 DIAGNOSIS — Z01818 Encounter for other preprocedural examination: Secondary | ICD-10-CM | POA: Insufficient documentation

## 2014-11-03 DIAGNOSIS — R918 Other nonspecific abnormal finding of lung field: Secondary | ICD-10-CM

## 2014-11-03 LAB — CBC
HCT: 41.6 % (ref 36.0–46.0)
Hemoglobin: 13.9 g/dL (ref 12.0–15.0)
MCH: 31 pg (ref 26.0–34.0)
MCHC: 33.4 g/dL (ref 30.0–36.0)
MCV: 92.9 fL (ref 78.0–100.0)
Platelets: 94 10*3/uL — ABNORMAL LOW (ref 150–400)
RBC: 4.48 MIL/uL (ref 3.87–5.11)
RDW: 13.2 % (ref 11.5–15.5)
WBC: 6.3 10*3/uL (ref 4.0–10.5)

## 2014-11-03 LAB — COMPREHENSIVE METABOLIC PANEL
ALBUMIN: 4.4 g/dL (ref 3.5–5.0)
ALK PHOS: 98 U/L (ref 38–126)
ALT: 20 U/L (ref 14–54)
AST: 23 U/L (ref 15–41)
Anion gap: 10 (ref 5–15)
BUN: 6 mg/dL (ref 6–20)
CHLORIDE: 106 mmol/L (ref 101–111)
CO2: 26 mmol/L (ref 22–32)
Calcium: 9.5 mg/dL (ref 8.9–10.3)
Creatinine, Ser: 0.75 mg/dL (ref 0.44–1.00)
GFR calc Af Amer: >60 mL/min (ref >60)
Glucose, Bld: 92 mg/dL (ref 65–99)
POTASSIUM: 4.5 mmol/L (ref 3.5–5.1)
Sodium: 142 mmol/L (ref 135–145)
Total Bilirubin: 1.1 mg/dL (ref 0.3–1.2)
Total Protein: 7.5 g/dL (ref 6.5–8.1)

## 2014-11-03 LAB — APTT: aPTT: 30 seconds (ref 24–37)

## 2014-11-03 LAB — PROTIME-INR
INR: 1.06 (ref 0.00–1.49)
Prothrombin Time: 14 seconds (ref 11.6–15.2)

## 2014-11-03 NOTE — Telephone Encounter (Signed)
Oncology Nurse Navigator Documentation  Oncology Nurse Navigator Flowsheets 11/03/2014  Navigator Encounter Type Other/Phone call   Treatment Phase Other/Pre-procedure  Barriers/Navigation Needs Education.  I followed up with patient to make sure she was set up for upcoming procedure and any questions she may have.  I called and we spoke about upcoming procedure.  I answered all questions and concerns at this time   Time Spent with Patient 15

## 2014-11-03 NOTE — Pre-Procedure Instructions (Signed)
    Summer Williams  11/03/2014      [id number] [place] [facility] [address] [id number]: [phone]: [phone]   Your procedure is scheduled on 11-10-2014   Friday   [facility]   Call this number if you have problems the morning of surgery:  [phone]   Remember:  Do not eat food or drink liquids after midnight.   Take these medicines the morning of surgery with A SIP OF WATER Arimidex(Anastrozole),Citalopram(Celexa)   Do not wear jewelry, make-up or nail polish.   Do not wear lotions, powders, or perfumes.    Do not shave 48 hours prior to surgery.     Do not bring valuables to the hospital.  [facility] or [facility]ables.  Contacts, dentures or bridgework may not be worn into surgery.      Patients discharged the day of surgery will not be allowed to drive home.     Special instructions:  See attached sheet for instructions on CHG shower/bath  Please read over the following fact sheets that you were given. Pain Booklet

## 2014-11-03 NOTE — Addendum Note (Signed)
Addended by: Steva Ready on: 11/03/2014 11:39 AM   Modules accepted: Level of Service

## 2014-11-06 ENCOUNTER — Ambulatory Visit (INDEPENDENT_AMBULATORY_CARE_PROVIDER_SITE_OTHER): Payer: PPO | Admitting: Internal Medicine

## 2014-11-06 ENCOUNTER — Encounter: Payer: Self-pay | Admitting: Internal Medicine

## 2014-11-06 VITALS — BP 104/70 | HR 69 | Ht 64.0 in | Wt 169.0 lb

## 2014-11-06 DIAGNOSIS — R938 Abnormal findings on diagnostic imaging of other specified body structures: Secondary | ICD-10-CM | POA: Diagnosis not present

## 2014-11-06 DIAGNOSIS — R9389 Abnormal findings on diagnostic imaging of other specified body structures: Secondary | ICD-10-CM

## 2014-11-06 DIAGNOSIS — J449 Chronic obstructive pulmonary disease, unspecified: Secondary | ICD-10-CM | POA: Diagnosis not present

## 2014-11-06 NOTE — Assessment & Plan Note (Signed)
She continues to sleep with oxygen 2 L/APS. Symptomatically well controlled without active bronchitis. She has needed Xopenex to minimize cardiac stimulation but is getting good control with limited use. PFT confirms severe obstructive airways disease with significant response to bronchodilator, reinforcing need to use bronchodilator. We may need to return to use of a maintenance controller.

## 2014-11-06 NOTE — Patient Instructions (Signed)
Ok to continue your nebulizer treatments with xopenex  Good luck with your upcoming procedures.  If you need our help, please call.

## 2014-11-06 NOTE — Assessment & Plan Note (Signed)
Very concerning for possibility of neoplasm right midlung zone. I explained to her that I strongly agree with plans for Dr. Roxan Hockey to do bronchoscopy. She has history of breast cancer and apparently some concern about possible colon cancer.

## 2014-11-06 NOTE — Progress Notes (Signed)
07/21/12- 67 F former smoker-Self referral-COPD(pt states dx'd few years ago at [facility]) She complains of dyspnea on exertion, up and down stairs and relieved by rest. Episode of bronchitis at Christmas with shortness of breath. Had done pulmonary rehabilitation. She paces herself. She may wheeze a little. Cough at times, productive of white sputum with no chest pain, blood or fever. Little variation in exercise tolerance from day to day. Breathing does not wake her. Neither Advair nor pro air were much help. She denies history of heart disease, seasonal allergy or asthma. Past history of a pneumonia. Denies anemia, glaucoma or peripheral edema. 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 CXR12/19/13- IMPRESSION:  No pneumonia. Peribronchial thickening most likely represents  bronchitis.  Original Report Authenticated By: Ivar Drape, M.D.  08/30/12- 67 F former smoker-Self referral-COPD(pt states dx'd few years ago at [facility]) [place]: review PFT and 6MW with patient. Denies change since last here. Spiriva sample had no effect. Little cough. She had done pulmonary rehabilitation at [facility]. They let her go to make room in the program for others. a1AT normal 158 no phenotype 6 minute walk test 08/30/2012-92%, 87%, 94%, 297 m. Significant desaturation with exercise. PFT: 08/30/2012- severe obstructive airways disease with response to bronchodilator, air trapping, diffusion severely reduced. FVC 1.96/71%, FEV1 0.89/45%, FEV1/FVC 0.45. RV 134%, DLCO 47%.  11/29/12- 67 F former smoker-Self referral-COPD(pt states dx'd few years ago at [facility]: chooses not to use inhalers-cant tell that they are helping; Continues to stay SOB-worsened with activity; wheezing, slight cough(non productive recently), denies any chest congestion. O2 2l/ APS for sleep- using regularly since [place]NO[place] Saw no effect limited trial Spiriva or  Symbicort  Had done Pulm Rehab. Educated exercise, progression of COPD.  05/31/13- 67 F former smoker-Self referral-COPD/ severe FOLLOWS FOR: Pt states that she feels her SOB has gotten worse since last; has to stop more often and rest. Sleeps with oxygen 2 L/ APS Has done pulmonary rehabilitation. Denies chest pain, swelling. Scant phlegm is clear.  09/29/13- 68 F former smoker-Self referral-COPD/ severe FOLLOWS FOR:  Still having sob with exertion and having to stop and rest often Sleeps with oxygen 2 L/ APS No specific spring seasonal problems. She tried a sample of Symbicort but was using it only one puff one time daily and couldn't tell that it helped. CXR 05/31/13 IMPRESSION:  1. Lung hyperexpansion and bronchitic change without acute  cardiopulmonary disease.  2. Sequela of prior granulomatous infection as above.  Electronically Signed  By: Sandi Mariscal M.D.  On: 05/31/2013 10:38   03/01/14- 12 F former smoker- followed for COPD/ chronic bronchitis severe, complicated by hx breast ca, hx shizoaffective disorder                   Son and his wife here Sleeps with oxygen 2 L/ APS increased DOE x 1 month.  Wheezing with activity.  No chest tightness/pain. Has felt more short of breath in the past month, and vague and not abrupt. Diarrhea is being treated with Imodium- started before theophylline. Unsure if theophylline helps breathing now but it seemed to at first. She has a number of inhalers at home and cannot tell they help, which is why we try theophylline in the first place.PFT in 2014 shown severe COPD with response to bronchodilator. She denied cough or wheeze but her son and daughter-in-law assured her that she coughs productively every  day. She denies chest pain, fever, edema, palpitation or blood. We discussed her previous experience with pulmonary rehabilitation. She insists she does not want to go back-did not like the regimented environment or monitored exercise.she says she  will be willing to walk more, mentioning only very vague ideas.  05/03/15-68 F former smoker- followed for COPD/ chronic bronchitis severe, complicated by hx L breast ca/ XRT, hx schizoaffective disorder                    Sleeps with oxygen 2 L/ APS FOLLOWS FOR: COPD pt has sob with exertion unchanged from last visit. Pt c/o cough with brownish mucus and wheezing. Pt denies chest tightnesss/congestion. Getting over a cold "not bad", denies need for antibiotic. No fever, purulent or blood. Mentions hx chronic loose stools.  11/06/14- 68 F former smoker- followed for COPD/ chronic bronchitis severe, complicated by hx L breast ca/ XRT, hx schizoaffective disorder                    Sleeps with oxygen 2 L/ APS Follow up to CT done on May 11, pt. states that she is doing well  She has recently seen Dr. Roxan Hockey for CT scan follow-up. He offered her 80% chance of cancer and the plan bronchoscopy this week. She indicates good understanding. Breathing is stable-denies cough or chest pain. Dyspnea on exertion with brisk walk or stairs, stable. Also pending colonoscopy/Dr. Deatra Ina. Screening tool suggested increased risk of colon cancer. Using Xopenex by nebulizer one or 2 times per day-mild help. PFT: 11/02/2014-severe obstructive airways disease. FEV1 0.90/39% with significant response to bronchodilator. CT chest 10/11/14-I reviewed images IMPRESSION: 1. Persistent irregular predominately ground-glass density in the right middle and lower lobes. This process may be minimally larger with soft tissue components anteriorly. The persistence excludes an acute inflammatory process. Although potentially scarring from previous inflammation, this finding is now more concerning for adenomatous hyperplasia or early adenocarcinoma. Thoracic surgery consultation is recommended. PET/CT should be considered for staging purposes. These recommendations are taken from: Recommendations for the Management of Subsolid  Pulmonary Nodules Detected at CT: A Statement from the [facility] 2013; 266:1, 304-317. 2. No evidence of metastatic breast cancer. 3. Stable emphysema and scattered subpleural nodularity in the right middle and lower lobes. 4. Stable probable radiation changes in the left breast. Electronically Signed  By: Richardean Sale M.D.  On: 10/11/2014 14:57   ROS-see HPI Constitutional:   No-   weight loss, night sweats, fevers, chills, fatigue, lassitude. HEENT:   No-  headaches, difficulty swallowing, tooth/dental problems, sore throat,       No-  sneezing, itching, ear ache, nasal congestion, post nasal drip,  CV:  No-   chest pain, orthopnea, PND, swelling in lower extremities, anasarca, dizziness, palpitations Resp: +  shortness of breath with exertion or at rest.              +  productive cough,  + non-productive cough,  No- coughing up of blood.              +   change in color of mucus.  No- wheezing.   Skin: No-   rash or lesions. GI:  No-   heartburn, indigestion, abdominal pain, nausea, vomiting,  GU:  MS:  No-   joint pain or swelling.   Neuro-     nothing unusual Psych:  No- change in mood or affect. No depression or anxiety.  No memory loss.  OBJ-  Physical Exam General- Alert, Oriented, Affect-appropriate, Distress- none acute, overweight Skin- rash-none, lesions- none, excoriation- none Lymphadenopathy- none Head- atraumatic            Eyes- Gross vision intact, PERRLA, conjunctivae and secretions clear            Ears- Hearing, canals-normal            Nose- Clear, no-Septal dev, mucus, polyps, erosion, perforation             Throat- Mallampati II , mucosa clear , drainage- none, tonsils- atrophic,  Neck- flexible , trachea midline, no stridor , thyroid nl, carotid no bruit Chest - symmetrical excursion , unlabored           Heart/CV- RRR , no murmur , no gallop  , no rub, nl s1 s2                           - JVD- none , edema- none, stasis  changes- none, varices- none           Lung- +diminished, clear, unlabored, wheeze- none, No- cough , dullness-none, rub- none           Chest wall-  Abd-  Br/ Gen/ Rectal- Not done, not indicated Extrem- cyanosis- none, clubbing, none, atrophy- none, strength- nl Neuro- grossly intact to observation

## 2014-11-07 ENCOUNTER — Encounter (HOSPITAL_COMMUNITY): Payer: Self-pay | Admitting: *Deleted

## 2014-11-08 ENCOUNTER — Encounter (HOSPITAL_COMMUNITY)
Admission: RE | Disposition: A | Discharge status: Home / Self Care | Payer: Self-pay | Source: Ambulatory Visit | Attending: Gastroenterology

## 2014-11-08 ENCOUNTER — Ambulatory Visit (HOSPITAL_COMMUNITY): Payer: PPO | Admitting: Certified Registered Nurse Anesthetist

## 2014-11-08 ENCOUNTER — Ambulatory Visit (HOSPITAL_COMMUNITY)
Admission: RE | Admit: 2014-11-08 | Discharge: 2014-11-08 | Disposition: A | Discharge status: Home / Self Care | Payer: PPO | Source: Ambulatory Visit | Attending: Gastroenterology | Admitting: Gastroenterology

## 2014-11-08 ENCOUNTER — Encounter (HOSPITAL_COMMUNITY): Payer: Self-pay | Admitting: *Deleted

## 2014-11-08 DIAGNOSIS — Z87891 Personal history of nicotine dependence: Secondary | ICD-10-CM | POA: Insufficient documentation

## 2014-11-08 DIAGNOSIS — E559 Vitamin D deficiency, unspecified: Secondary | ICD-10-CM | POA: Insufficient documentation

## 2014-11-08 DIAGNOSIS — J449 Chronic obstructive pulmonary disease, unspecified: Secondary | ICD-10-CM | POA: Insufficient documentation

## 2014-11-08 DIAGNOSIS — Z1211 Encounter for screening for malignant neoplasm of colon: Secondary | ICD-10-CM | POA: Diagnosis not present

## 2014-11-08 DIAGNOSIS — Z9049 Acquired absence of other specified parts of digestive tract: Secondary | ICD-10-CM | POA: Insufficient documentation

## 2014-11-08 DIAGNOSIS — Z85828 Personal history of other malignant neoplasm of skin: Secondary | ICD-10-CM | POA: Diagnosis not present

## 2014-11-08 DIAGNOSIS — K5730 Diverticulosis of large intestine without perforation or abscess without bleeding: Secondary | ICD-10-CM | POA: Diagnosis not present

## 2014-11-08 DIAGNOSIS — K648 Other hemorrhoids: Secondary | ICD-10-CM | POA: Insufficient documentation

## 2014-11-08 DIAGNOSIS — Z853 Personal history of malignant neoplasm of breast: Secondary | ICD-10-CM | POA: Insufficient documentation

## 2014-11-08 DIAGNOSIS — F209 Schizophrenia, unspecified: Secondary | ICD-10-CM | POA: Insufficient documentation

## 2014-11-08 DIAGNOSIS — R195 Other fecal abnormalities: Secondary | ICD-10-CM

## 2014-11-08 DIAGNOSIS — Z923 Personal history of irradiation: Secondary | ICD-10-CM | POA: Insufficient documentation

## 2014-11-08 HISTORY — PX: COLONOSCOPY WITH PROPOFOL: SHX5780

## 2014-11-08 SURGERY — COLONOSCOPY WITH PROPOFOL
Anesthesia: Monitor Anesthesia Care

## 2014-11-08 MED ORDER — LIDOCAINE HCL (CARDIAC) 20 MG/ML IV SOLN
INTRAVENOUS | Status: DC | PRN
  Administered 2014-11-08: 50 mg via INTRAVENOUS

## 2014-11-08 MED ORDER — SODIUM CHLORIDE 0.9 % IV SOLN: INTRAVENOUS | Status: DC

## 2014-11-08 MED ORDER — PROPOFOL 10 MG/ML IV BOLUS
INTRAVENOUS | Status: AC
  Filled 2014-11-08: qty 20

## 2014-11-08 MED ORDER — PROPOFOL 10 MG/ML IV BOLUS
INTRAVENOUS | Status: DC | PRN
  Administered 2014-11-08 (×6): 50 mg via INTRAVENOUS

## 2014-11-08 MED ORDER — LIDOCAINE HCL (CARDIAC) 20 MG/ML IV SOLN
INTRAVENOUS | Status: AC
  Filled 2014-11-08: qty 5

## 2014-11-08 MED ORDER — LACTATED RINGERS IV SOLN
INTRAVENOUS | Status: DC
  Administered 2014-11-08: 1000 mL via INTRAVENOUS

## 2014-11-08 SURGICAL SUPPLY — 21 items

## 2014-11-08 NOTE — Transfer of Care (Signed)
Immediate Anesthesia Transfer of Care Note  Patient: Summer Williams  Procedure(s) Performed: Procedure(s): COLONOSCOPY WITH PROPOFOL (N/A)  Patient Location: PACU  Anesthesia Type:MAC  Level of Consciousness: awake, alert  and oriented  Airway & Oxygen Therapy: Patient Spontanous Breathing and Patient connected to nasal cannula oxygen  Post-op Assessment: Report given to RN and Post -op Vital signs reviewed and stable  Post vital signs: Reviewed and stable  Last Vitals:  Filed Vitals:   11/08/14 1053  BP: 117/65  Pulse: 92  Temp: 36.7 C  Resp: 20    Complications: No apparent anesthesia complications

## 2014-11-08 NOTE — Op Note (Signed)
[facility] [place], [address]   COLONOSCOPY PROCEDURE REPORT  PATIENT: Summer Williams, Summer Williams  MR#: [id number] BIRTHDATE: 07/23/1945 , 21  yrs. old GENDER: female ENDOSCOPIST: Inda Castle, MD REFERRED OY:DXAJ Parke Simmers, M.D. PROCEDURE DATE:  11/08/2014 PROCEDURE:   Colonoscopy, diagnostic First Screening Colonoscopy - Avg.  risk and is 50 yrs.  old or older Yes.  Prior Negative Screening - Now for repeat screening. N/A  History of Adenoma - Now for follow-up colonoscopy & has been > or = to 3 yrs.  N/A  Recommend repeat exam, <10 yrs? No ASA CLASS:   Class II INDICATIONS:Colorectal Neoplasm Risk Assessment for this procedure is average risk.   positive Cologuard MEDICATIONS: Monitored anesthesia care  DESCRIPTION OF PROCEDURE:   After the risks benefits and alternatives of the procedure were thoroughly explained, informed consent was obtained.  The digital rectal exam revealed no abnormalities of the rectum.   The Pentax Adult Colon [id number] endoscope was introduced through the anus and advanced to the cecum, which was identified by both the appendix and ileocecal valve. No adverse events experienced.   The quality of the prep was (Suprep was used) excellent.  The instrument was then slowly withdrawn as the colon was fully examined. Estimated blood loss is zero unless otherwise noted in this procedure report.    Retroflexed views revealed no abnormalities. The time to cecum = 6:00 Withdrawal time =      The scope was withdrawn and the procedure completed. COMPLICATIONS: There were no immediate complications.  ENDOSCOPIC IMPRESSION: 1.   Moderate diverticulosis was noted in the sigmoid colon 2.   Internal hemorrhoids  RECOMMENDATIONS: no further GI workup  eSigned:  Inda Castle, MD 11/08/2014 1:01 PM   cc:

## 2014-11-08 NOTE — Interval H&P Note (Signed)
History and Physical Interval Note:  11/08/2014 12:26 PM  Summer Williams  has presented today for surgery, with the diagnosis of screening colonoscopy  The various methods of treatment have been discussed with the patient and family. After consideration of risks, benefits and other options for treatment, the patient has consented to  Procedure(s): COLONOSCOPY WITH PROPOFOL (N/A) as a surgical intervention .  The patient's history has been reviewed, patient examined, no change in status, stable for surgery.  I have reviewed the patient's chart and labs.  Questions were answered to the patient's satisfaction.     Erskine Emery

## 2014-11-08 NOTE — Anesthesia Preprocedure Evaluation (Signed)
Anesthesia Evaluation  Patient identified by MRN, date of birth, ID band Patient awake    Reviewed: Allergy & Precautions, NPO status , Patient's Chart, lab work & pertinent test results  History of Anesthesia Complications Negative for: history of anesthetic complications  Airway Mallampati: III  TM Distance: >3 FB     Dental  (+) Teeth Intact   Pulmonary shortness of breath and with exertion, COPD COPD inhaler, former smoker,  breath sounds clear to auscultation        Cardiovascular negative cardio ROS  Rhythm:Regular     Neuro/Psych PSYCHIATRIC DISORDERS Depression Schizophrenia    GI/Hepatic negative GI ROS, Neg liver ROS,   Endo/Other  negative endocrine ROS  Renal/GU negative Renal ROS     Musculoskeletal   Abdominal   Peds  Hematology negative hematology ROS (+)   Anesthesia Other Findings   Reproductive/Obstetrics                             Anesthesia Physical Anesthesia Plan  ASA: II  Anesthesia Plan: MAC   Post-op Pain Management:    Induction: Intravenous  Airway Management Planned: Nasal Cannula  Additional Equipment:   Intra-op Plan:   Post-operative Plan:   Informed Consent: I have reviewed the patients History and Physical, chart, labs and discussed the procedure including the risks, benefits and alternatives for the proposed anesthesia with the patient or authorized representative who has indicated his/her understanding and acceptance.     Plan Discussed with: CRNA and Surgeon  Anesthesia Plan Comments:         Anesthesia Quick Evaluation

## 2014-11-08 NOTE — H&P (Signed)
_                                                                                                                History of Present Illness:  Summer Williams is a 69 year old white female here for colonoscopy.  Her had a colonoscopy.  She recently had a positive Cologuard.  She was complaining of diarrhea which has resolved since starting cholestyramine.   Past Medical History  Diagnosis Date  . History of alcohol abuse     last 1980  . Hyperlipidemia   . Vitamin D deficiency   . Depression   . Wears glasses   . Requires supplemental oxygen     at night USES 2 LITERS   . Radiation 03/23/14-04/19/14    Left breast  . Oxygen deficiency   . COPD (chronic obstructive pulmonary disease)   . Skin cancer     Squamous  . Breast cancer 01/05/14    left breast  . Shortness of breath      WITH ACTIVITY   Past Surgical History  Procedure Laterality Date  . Cholecystectomy  2003  . Dental surgery      implants  . Breast surgery  02/09/2014    Left luimpectomy  . Eye surgery Bilateral 2005    Cataract OD   family history includes Breast cancer in her maternal aunt; Cholelithiasis in her mother; Heart disease in her father; Leukemia in her paternal grandfather; Lung cancer in her maternal grandfather; Ovarian cancer in her maternal aunt. There is no history of Colon cancer, Colon polyps, Esophageal cancer, Kidney disease, Gallbladder disease, Stomach cancer, or Rectal cancer. Current Facility-Administered Medications  Medication Dose Route Frequency Provider Last Rate Last Dose  . 0.9 %  sodium chloride infusion   Intravenous Continuous Inda Castle, MD      . lactated ringers infusion   Intravenous Continuous Inda Castle, MD 20 mL/hr at 11/08/14 1108 1,000 mL at 11/08/14 1108   Allergies as of 10/02/2014  . (No Known Allergies)    reports that she quit smoking about 9 years ago. Her smoking use included Cigarettes. She has a 50 pack-year smoking history. She has  never used smokeless tobacco. She reports that she does not drink alcohol or use illicit drugs.   Review of Systems: Pertinent positive and negative review of systems were noted in the above HPI section. All other review of systems were otherwise negative.  Vital signs were reviewed in today's medical record Physical Exam: General: Well developed , well nourished, no acute distress Skin: anicteric Head: Normocephalic and atraumatic Eyes:  sclerae anicteric, EOMI Ears: Normal auditory acuity Mouth: No deformity or lesions Neck: Supple, no masses or thyromegaly Lymph Nodes: no lymphadenopathy Lungs: Clear throughout to auscultation Heart: Regular rate and rhythm; no murmurs, rubs or bruits Gastroinestinal: Soft, non tender and non distended. No masses, hepatosplenomegaly or hernias noted. Normal Bowel sounds Rectal:deferred Musculoskeletal: Symmetrical with no gross deformities  Skin: No lesions on visible extremities Pulses:  Normal pulses noted Extremities: No clubbing, cyanosis, edema or deformities noted Neurological: Alert oriented x 4, grossly nonfocal Cervical Nodes:  No significant cervical adenopathy Inguinal Nodes: No significant inguinal adenopathy Psychological:  Alert and cooperative. Normal mood and affect  See Assessment and Plan under Problem List   Impression-constipation.  In the setting of a positive Cologuard colonic the aplasia should be ruled out  Plan-colonoscopy

## 2014-11-08 NOTE — H&P (View-Only) (Signed)
07/21/12- 67 F former smoker-Self referral-COPD(pt states dx'd few years ago at [facility]) She complains of dyspnea on exertion, up and down stairs and relieved by rest. Episode of bronchitis at Christmas with shortness of breath. Had done pulmonary rehabilitation. She paces herself. She may wheeze a little. Cough at times, productive of white sputum with no chest pain, blood or fever. Little variation in exercise tolerance from day to day. Breathing does not wake her. Neither Advair nor pro air were much help. She denies history of heart disease, seasonal allergy or asthma. Past history of a pneumonia. Denies anemia, glaucoma or peripheral edema. 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 CXR12/19/13- IMPRESSION:  No pneumonia. Peribronchial thickening most likely represents  bronchitis.  Original Report Authenticated By: Ivar Drape, M.D.  08/30/12- 67 F former smoker-Self referral-COPD(pt states dx'd few years ago at [facility]) [place]: review PFT and 6MW with patient. Denies change since last here. Spiriva sample had no effect. Little cough. She had done pulmonary rehabilitation at [facility]. They let her go to make room in the program for others. a1AT normal 158 no phenotype 6 minute walk test 08/30/2012-92%, 87%, 94%, 297 m. Significant desaturation with exercise. PFT: 08/30/2012- severe obstructive airways disease with response to bronchodilator, air trapping, diffusion severely reduced. FVC 1.96/71%, FEV1 0.89/45%, FEV1/FVC 0.45. RV 134%, DLCO 47%.  11/29/12- 67 F former smoker-Self referral-COPD(pt states dx'd few years ago at [place]: chooses not to use inhalers-cant tell that they are helping; Continues to stay SOB-worsened with activity; wheezing, slight cough(non productive recently), denies any chest congestion. O2 2l/ APS for sleep- using regularly since ONOX Saw no effect limited trial Spiriva or  Symbicort  Had done Pulm Rehab. Educated exercise, progression of COPD.  05/31/13- 67 F former smoker-Self referral-COPD/ severe FOLLOWS FOR: Pt states that she feels her SOB has gotten worse since last; has to stop more often and rest. Sleeps with oxygen 2 L/ APS Has done pulmonary rehabilitation. Denies chest pain, swelling. Scant phlegm is clear.  09/29/13- 68 F former smoker-Self referral-COPD/ severe FOLLOWS FOR:  Still having sob with exertion and having to stop and rest often Sleeps with oxygen 2 L/ APS No specific spring seasonal problems. She tried a sample of Symbicort but was using it only one puff one time daily and couldn't tell that it helped. CXR 05/31/13 IMPRESSION:  1. Lung hyperexpansion and bronchitic change without acute  cardiopulmonary disease.  2. Sequela of prior granulomatous infection as above.  Electronically Signed  By: Sandi Mariscal M.D.  On: 05/31/2013 10:38   03/01/14- 12 F former smoker- followed for COPD/ chronic bronchitis severe, complicated by hx breast ca, hx shizoaffective disorder                   Son and his wife here Sleeps with oxygen 2 L/ APS increased DOE x 1 month.  Wheezing with activity.  No chest tightness/pain. Has felt more short of breath in the past month, and vague and not abrupt. Diarrhea is being treated with Imodium- started before theophylline. Unsure if theophylline helps breathing now but it seemed to at first. She has a number of inhalers at home and cannot tell they help, which is why we try theophylline in the first place.PFT in 2014 shown severe COPD with response to bronchodilator. She denied cough or wheeze but her son and daughter-in-law assured her that she coughs productively every  day. She denies chest pain, fever, edema, palpitation or blood. We discussed her previous experience with pulmonary rehabilitation. She insists she does not want to go back-did not like the regimented environment or monitored exercise.she says she  will be willing to walk more, mentioning only very vague ideas.  05/03/15-68 F former smoker- followed for COPD/ chronic bronchitis severe, complicated by hx L breast ca/ XRT, hx schizoaffective disorder                    Sleeps with oxygen 2 L/ APS FOLLOWS FOR: COPD pt has sob with exertion unchanged from last visit. Pt c/o cough with brownish mucus and wheezing. Pt denies chest tightnesss/congestion. Getting over a cold "not bad", denies need for antibiotic. No fever, purulent or blood. Mentions hx chronic loose stools.  11/06/14- 68 F former smoker- followed for COPD/ chronic bronchitis severe, complicated by hx L breast ca/ XRT, hx schizoaffective disorder                    Sleeps with oxygen 2 L/ APS Follow up to CT done on May 11, pt. states that she is doing well  She has recently seen Dr. Roxan Hockey for CT scan follow-up. He offered her 80% chance of cancer and the plan bronchoscopy this week. She indicates good understanding. Breathing is stable-denies cough or chest pain. Dyspnea on exertion with brisk walk or stairs, stable. Also pending colonoscopy/Dr. Deatra Ina. Screening tool suggested increased risk of colon cancer. Using Xopenex by nebulizer one or 2 times per day-mild help. PFT: 11/02/2014-severe obstructive airways disease. FEV1 0.90/39% with significant response to bronchodilator. CT chest 10/11/14-I reviewed images IMPRESSION: 1. Persistent irregular predominately ground-glass density in the right middle and lower lobes. This process may be minimally larger with soft tissue components anteriorly. The persistence excludes an acute inflammatory process. Although potentially scarring from previous inflammation, this finding is now more concerning for adenomatous hyperplasia or early adenocarcinoma. Thoracic surgery consultation is recommended. PET/CT should be considered for staging purposes. These recommendations are taken from: Recommendations for the Management of Subsolid  Pulmonary Nodules Detected at CT: A Statement from the [facility] 2013; 266:1, 304-317. 2. No evidence of metastatic breast cancer. 3. Stable emphysema and scattered subpleural nodularity in the right middle and lower lobes. 4. Stable probable radiation changes in the left breast. Electronically Signed  By: Richardean Sale M.D.  On: 10/11/2014 14:57   ROS-see HPI Constitutional:   No-   weight loss, night sweats, fevers, chills, fatigue, lassitude. HEENT:   No-  headaches, difficulty swallowing, tooth/dental problems, sore throat,       No-  sneezing, itching, ear ache, nasal congestion, post nasal drip,  CV:  No-   chest pain, orthopnea, PND, swelling in lower extremities, anasarca, dizziness, palpitations Resp: +  shortness of breath with exertion or at rest.              +  productive cough,  + non-productive cough,  No- coughing up of blood.              +   change in color of mucus.  No- wheezing.   Skin: No-   rash or lesions. GI:  No-   heartburn, indigestion, abdominal pain, nausea, vomiting,  GU:  MS:  No-   joint pain or swelling.   Neuro-     nothing unusual Psych:  No- change in mood or affect. No depression or anxiety.  No memory loss.  OBJ-  Physical Exam General- Alert, Oriented, Affect-appropriate, Distress- none acute, overweight Skin- rash-none, lesions- none, excoriation- none Lymphadenopathy- none Head- atraumatic            Eyes- Gross vision intact, PERRLA, conjunctivae and secretions clear            Ears- Hearing, canals-normal            Nose- Clear, no-Septal dev, mucus, polyps, erosion, perforation             Throat- Mallampati II , mucosa clear , drainage- none, tonsils- atrophic,  Neck- flexible , trachea midline, no stridor , thyroid nl, carotid no bruit Chest - symmetrical excursion , unlabored           Heart/CV- RRR , no murmur , no gallop  , no rub, nl s1 s2                           - JVD- none , edema- none, stasis  changes- none, varices- none           Lung- +diminished, clear, unlabored, wheeze- none, No- cough , dullness-none, rub- none           Chest wall-  Abd-  Br/ Gen/ Rectal- Not done, not indicated Extrem- cyanosis- none, clubbing, none, atrophy- none, strength- nl Neuro- grossly intact to observation

## 2014-11-08 NOTE — Discharge Instructions (Signed)
Diverticulosis Diverticulosis is the condition that develops when small pouches (diverticula) form in the wall of your colon. Your colon, or large intestine, is where water is absorbed and stool is formed. The pouches form when the inside layer of your colon pushes through weak spots in the outer layers of your colon. CAUSES  No one knows exactly what causes diverticulosis. RISK FACTORS  Being older than 16. Your risk for this condition increases with age. Diverticulosis is rare in people younger than 40 years. By age 29, almost everyone has it.  Eating a low-fiber diet.  Being frequently constipated.  Being overweight.  Not getting enough exercise.  Smoking.  Taking over-the-counter pain medicines, like aspirin and ibuprofen. SYMPTOMS  Most people with diverticulosis do not have symptoms. DIAGNOSIS  Because diverticulosis often has no symptoms, health care providers often discover the condition during an exam for other colon problems. In many cases, a health care provider will diagnose diverticulosis while using a flexible scope to examine the colon (colonoscopy). TREATMENT  If you have never developed an infection related to diverticulosis, you may not need treatment. If you have had an infection before, treatment may include:  Eating more fruits, vegetables, and grains.  Taking a fiber supplement.  Taking a live bacteria supplement (probiotic).  Taking medicine to relax your colon. HOME CARE INSTRUCTIONS   Drink at least 6-8 glasses of water each day to prevent constipation.  Try not to strain when you have a bowel movement.  Keep all follow-up appointments. If you have had an infection before:  Increase the fiber in your diet as directed by your health care provider or dietitian.  Take a dietary fiber supplement if your health care provider approves.  Only take medicines as directed by your health care provider. SEEK MEDICAL CARE IF:   You have abdominal  pain.  You have bloating.  You have cramps.  You have not gone to the bathroom in 3 days. SEEK IMMEDIATE MEDICAL CARE IF:   Your pain gets worse.  Yourbloating becomes very bad.  You have a fever or chills, and your symptoms suddenly get worse.  You begin vomiting.  You have bowel movements that are bloody or black. MAKE SURE YOU:  Understand these instructions.  Will watch your condition.  Will get help right away if you are not doing well or get worse. Document Released: 02/14/2004 Document Revised: 05/24/2013 Document Reviewed: 04/13/2013 [facility] Patient Information 2015 [place], [place]. This information is not intended to replace advice given to you by your health care provider. Make sure you discuss any questions you have with your health care provider. Colonoscopy, Care After Refer to this sheet in the next few weeks. These instructions provide you with information on caring for yourself after your procedure. Your health care provider may also give you more specific instructions. Your treatment has been planned according to current medical practices, but problems sometimes occur. Call your health care provider if you have any problems or questions after your procedure. WHAT TO EXPECT AFTER THE PROCEDURE  After your procedure, it is typical to have the following:  A small amount of blood in your stool.  Moderate amounts of gas and mild abdominal cramping or bloating. HOME CARE INSTRUCTIONS  Do not drive, operate machinery, or sign important documents for 24 hours.  You may shower and resume your regular physical activities, but move at a slower pace for the first 24 hours.  Take frequent rest periods for the first 24 hours.  Walk  around or put a warm pack on your abdomen to help reduce abdominal cramping and bloating.  Drink enough fluids to keep your urine clear or pale yellow.  You may resume your normal diet as instructed by your health care provider. Avoid  heavy or fried foods that are hard to digest.  Avoid drinking alcohol for 24 hours or as instructed by your health care provider.  Only take over-the-counter or prescription medicines as directed by your health care provider.  If a tissue sample (biopsy) was taken during your procedure:  Do not take aspirin or blood thinners for 7 days, or as instructed by your health care provider.  Do not drink alcohol for 7 days, or as instructed by your health care provider.  Eat soft foods for the first 24 hours. SEEK MEDICAL CARE IF: You have persistent spotting of blood in your stool 2-3 days after the procedure. SEEK IMMEDIATE MEDICAL CARE IF:  You have more than a small spotting of blood in your stool.  You pass large blood clots in your stool.  Your abdomen is swollen (distended).  You have nausea or vomiting.  You have a fever.  You have increasing abdominal pain that is not relieved with medicine. Document Released: 01/01/2004 Document Revised: 03/09/2013 Document Reviewed: 01/24/2013 [facility] Patient Information 2015 [place], [place]. This information is not intended to replace advice given to you by your health care provider. Make sure you discuss any questions you have with your health care provider.

## 2014-11-08 NOTE — Anesthesia Postprocedure Evaluation (Signed)
  Anesthesia Post-op Note  Patient: Summer Williams  Procedure(s) Performed: Procedure(s): COLONOSCOPY WITH PROPOFOL (N/A)  Patient Location: Endoscopy Unit  Anesthesia Type:MAC  Level of Consciousness: awake  Airway and Oxygen Therapy: Patient Spontanous Breathing  Post-op Pain: none  Post-op Assessment: Post-op Vital signs reviewed, Patient's Cardiovascular Status Stable, Respiratory Function Stable, Patent Airway, No signs of Nausea or vomiting and Pain level controlled              Post-op Vital Signs: Reviewed and stable  Last Vitals:  Filed Vitals:   11/08/14 1324  BP: 119/68  Pulse: 68  Temp:   Resp: 16    Complications: No apparent anesthesia complications

## 2014-11-09 ENCOUNTER — Encounter (HOSPITAL_COMMUNITY): Payer: Self-pay | Admitting: Gastroenterology

## 2014-11-10 ENCOUNTER — Ambulatory Visit (HOSPITAL_COMMUNITY): Payer: PPO | Admitting: Anesthesiology

## 2014-11-10 ENCOUNTER — Ambulatory Visit (HOSPITAL_COMMUNITY)
Admission: RE | Admit: 2014-11-10 | Discharge: 2014-11-10 | Disposition: A | Discharge status: Home / Self Care | Payer: PPO | Source: Ambulatory Visit | Attending: Thoracic Surgery (Cardiothoracic Vascular Surgery) | Admitting: Thoracic Surgery (Cardiothoracic Vascular Surgery)

## 2014-11-10 ENCOUNTER — Encounter (HOSPITAL_COMMUNITY)
Admission: RE | Disposition: A | Discharge status: Home / Self Care | Payer: Self-pay | Source: Ambulatory Visit | Attending: Thoracic Surgery (Cardiothoracic Vascular Surgery)

## 2014-11-10 ENCOUNTER — Encounter (HOSPITAL_COMMUNITY): Payer: Self-pay | Admitting: *Deleted

## 2014-11-10 ENCOUNTER — Ambulatory Visit (HOSPITAL_COMMUNITY): Payer: PPO

## 2014-11-10 DIAGNOSIS — Z419 Encounter for procedure for purposes other than remedying health state, unspecified: Secondary | ICD-10-CM

## 2014-11-10 DIAGNOSIS — F329 Major depressive disorder, single episode, unspecified: Secondary | ICD-10-CM | POA: Diagnosis not present

## 2014-11-10 DIAGNOSIS — R918 Other nonspecific abnormal finding of lung field: Secondary | ICD-10-CM | POA: Diagnosis present

## 2014-11-10 DIAGNOSIS — J449 Chronic obstructive pulmonary disease, unspecified: Secondary | ICD-10-CM | POA: Insufficient documentation

## 2014-11-10 DIAGNOSIS — Z85828 Personal history of other malignant neoplasm of skin: Secondary | ICD-10-CM | POA: Diagnosis not present

## 2014-11-10 DIAGNOSIS — E785 Hyperlipidemia, unspecified: Secondary | ICD-10-CM | POA: Insufficient documentation

## 2014-11-10 DIAGNOSIS — F259 Schizoaffective disorder, unspecified: Secondary | ICD-10-CM | POA: Diagnosis not present

## 2014-11-10 DIAGNOSIS — Z923 Personal history of irradiation: Secondary | ICD-10-CM | POA: Diagnosis not present

## 2014-11-10 DIAGNOSIS — R911 Solitary pulmonary nodule: Secondary | ICD-10-CM | POA: Diagnosis not present

## 2014-11-10 DIAGNOSIS — Z853 Personal history of malignant neoplasm of breast: Secondary | ICD-10-CM | POA: Insufficient documentation

## 2014-11-10 DIAGNOSIS — Z79899 Other long term (current) drug therapy: Secondary | ICD-10-CM | POA: Insufficient documentation

## 2014-11-10 DIAGNOSIS — Z87891 Personal history of nicotine dependence: Secondary | ICD-10-CM | POA: Diagnosis not present

## 2014-11-10 HISTORY — PX: VIDEO BRONCHOSCOPY WITH ENDOBRONCHIAL NAVIGATION: SHX6175

## 2014-11-10 SURGERY — VIDEO BRONCHOSCOPY WITH ENDOBRONCHIAL NAVIGATION
Anesthesia: General

## 2014-11-10 MED ORDER — GLYCOPYRROLATE 0.2 MG/ML IJ SOLN
INTRAMUSCULAR | Status: DC | PRN
  Administered 2014-11-10: 0.4 mg via INTRAVENOUS

## 2014-11-10 MED ORDER — EPHEDRINE SULFATE 50 MG/ML IJ SOLN
INTRAMUSCULAR | Status: DC | PRN
  Administered 2014-11-10: 10 mg via INTRAVENOUS
  Administered 2014-11-10: 15 mg via INTRAVENOUS

## 2014-11-10 MED ORDER — STERILE WATER FOR INJECTION IJ SOLN
INTRAMUSCULAR | Status: AC
  Filled 2014-11-10: qty 10

## 2014-11-10 MED ORDER — EPINEPHRINE HCL 1 MG/ML IJ SOLN
INTRAMUSCULAR | Status: AC
  Filled 2014-11-10: qty 1

## 2014-11-10 MED ORDER — PROPOFOL 10 MG/ML IV BOLUS
INTRAVENOUS | Status: DC | PRN
  Administered 2014-11-10: 30 mg via INTRAVENOUS
  Administered 2014-11-10: 80 mg via INTRAVENOUS

## 2014-11-10 MED ORDER — GLYCOPYRROLATE 0.2 MG/ML IJ SOLN
INTRAMUSCULAR | Status: AC
  Filled 2014-11-10: qty 1

## 2014-11-10 MED ORDER — VECURONIUM BROMIDE 10 MG IV SOLR
INTRAVENOUS | Status: AC
  Filled 2014-11-10: qty 10

## 2014-11-10 MED ORDER — FENTANYL CITRATE (PF) 250 MCG/5ML IJ SOLN
INTRAMUSCULAR | Status: AC
  Filled 2014-11-10: qty 5

## 2014-11-10 MED ORDER — LIDOCAINE HCL (CARDIAC) 20 MG/ML IV SOLN
INTRAVENOUS | Status: AC
  Filled 2014-11-10: qty 5

## 2014-11-10 MED ORDER — PROPOFOL 10 MG/ML IV BOLUS
INTRAVENOUS | Status: AC
  Filled 2014-11-10: qty 20

## 2014-11-10 MED ORDER — DEXAMETHASONE SODIUM PHOSPHATE 4 MG/ML IJ SOLN
INTRAMUSCULAR | Status: AC
  Filled 2014-11-10: qty 1

## 2014-11-10 MED ORDER — PHENYLEPHRINE 40 MCG/ML (10ML) SYRINGE FOR IV PUSH (FOR BLOOD PRESSURE SUPPORT)
PREFILLED_SYRINGE | INTRAVENOUS | Status: AC
  Filled 2014-11-10: qty 10

## 2014-11-10 MED ORDER — LIDOCAINE HCL (CARDIAC) 20 MG/ML IV SOLN
INTRAVENOUS | Status: DC | PRN
  Administered 2014-11-10: 40 mg via INTRAVENOUS
  Administered 2014-11-10: 100 mg via INTRATRACHEAL

## 2014-11-10 MED ORDER — ONDANSETRON HCL 4 MG/2ML IJ SOLN
INTRAMUSCULAR | Status: DC | PRN
  Administered 2014-11-10: 4 mg via INTRAVENOUS

## 2014-11-10 MED ORDER — GLYCOPYRROLATE 0.2 MG/ML IJ SOLN
INTRAMUSCULAR | Status: AC
  Filled 2014-11-10: qty 3

## 2014-11-10 MED ORDER — EPHEDRINE SULFATE 50 MG/ML IJ SOLN
INTRAMUSCULAR | Status: AC
  Filled 2014-11-10: qty 1

## 2014-11-10 MED ORDER — FENTANYL CITRATE (PF) 100 MCG/2ML IJ SOLN
INTRAMUSCULAR | Status: DC | PRN
  Administered 2014-11-10 (×2): 50 ug via INTRAVENOUS

## 2014-11-10 MED ORDER — ONDANSETRON HCL 4 MG/2ML IJ SOLN
INTRAMUSCULAR | Status: AC
  Filled 2014-11-10: qty 2

## 2014-11-10 MED ORDER — 0.9 % SODIUM CHLORIDE (POUR BTL) OPTIME
TOPICAL | Status: DC | PRN
  Administered 2014-11-10: 1000 mL

## 2014-11-10 MED ORDER — FENTANYL CITRATE (PF) 100 MCG/2ML IJ SOLN: 25.0000 ug | INTRAMUSCULAR | Status: DC | PRN

## 2014-11-10 MED ORDER — SODIUM CHLORIDE 0.9 % IJ SOLN
INTRAMUSCULAR | Status: AC
  Filled 2014-11-10: qty 10

## 2014-11-10 MED ORDER — NEOSTIGMINE METHYLSULFATE 10 MG/10ML IV SOLN
INTRAVENOUS | Status: AC
  Filled 2014-11-10: qty 1

## 2014-11-10 MED ORDER — MIDAZOLAM HCL 2 MG/2ML IJ SOLN
INTRAMUSCULAR | Status: AC
  Filled 2014-11-10: qty 2

## 2014-11-10 MED ORDER — VECURONIUM BROMIDE 10 MG IV SOLR
INTRAVENOUS | Status: DC | PRN
  Administered 2014-11-10: 4 mg via INTRAVENOUS

## 2014-11-10 MED ORDER — SUCCINYLCHOLINE CHLORIDE 20 MG/ML IJ SOLN
INTRAMUSCULAR | Status: AC
  Filled 2014-11-10: qty 1

## 2014-11-10 MED ORDER — DEXAMETHASONE SODIUM PHOSPHATE 4 MG/ML IJ SOLN
INTRAMUSCULAR | Status: DC | PRN
  Administered 2014-11-10: 4 mg via INTRAVENOUS

## 2014-11-10 MED ORDER — ROCURONIUM BROMIDE 50 MG/5ML IV SOLN
INTRAVENOUS | Status: AC
  Filled 2014-11-10: qty 1

## 2014-11-10 MED ORDER — PHENYLEPHRINE HCL 10 MG/ML IJ SOLN
10.0000 mg | INTRAVENOUS | Status: DC | PRN
  Administered 2014-11-10: 20 ug/min via INTRAVENOUS

## 2014-11-10 MED ORDER — LACTATED RINGERS IV SOLN
INTRAVENOUS | Status: DC | PRN
  Administered 2014-11-10 (×2): via INTRAVENOUS

## 2014-11-10 MED ORDER — NEOSTIGMINE METHYLSULFATE 10 MG/10ML IV SOLN
INTRAVENOUS | Status: DC | PRN
  Administered 2014-11-10: 3 mg via INTRAVENOUS

## 2014-11-10 SURGICAL SUPPLY — 35 items
BRUSH SUPERTRAX BIOPSY (INSTRUMENTS) IMPLANT
BRUSH SUPERTRAX NDL-TIP CYTO (INSTRUMENTS) ×3 IMPLANT
CANISTER SUCTION 2500CC (MISCELLANEOUS) ×3 IMPLANT
CHANNEL WORK EXTEND EDGE 180 (KITS) IMPLANT
CHANNEL WORK EXTEND EDGE 45 (KITS) IMPLANT
CHANNEL WORK EXTEND EDGE 90 (KITS) IMPLANT
CONT SPEC 4OZ CLIKSEAL STRL BL (MISCELLANEOUS) ×12 IMPLANT
COVER TABLE BACK 60X90 (DRAPES) ×3 IMPLANT
FILTER STRAW FLUID ASPIR (MISCELLANEOUS) IMPLANT
FORCEPS BIOP SUPERTRX PREMAR (INSTRUMENTS) ×3 IMPLANT
GAUZE SPONGE 4X4 12PLY STRL (GAUZE/BANDAGES/DRESSINGS) ×3 IMPLANT
GLOVE SURG SIGNA 7.5 PF LTX (GLOVE) ×3 IMPLANT
GLOVE SURG SS PI 7.0 STRL IVOR (GLOVE) ×3 IMPLANT
GOWN STRL REUS W/ TWL XL LVL3 (GOWN DISPOSABLE) ×1 IMPLANT
GOWN STRL REUS W/TWL XL LVL3 (GOWN DISPOSABLE) ×2
KIT CLEAN ENDO COMPLIANCE (KITS) ×3 IMPLANT
KIT PROCEDURE EDGE 180 (KITS) IMPLANT
KIT PROCEDURE EDGE 45 (KITS) IMPLANT
KIT PROCEDURE EDGE 90 (KITS) ×3 IMPLANT
KIT ROOM TURNOVER OR (KITS) ×3 IMPLANT
MARKER SKIN DUAL TIP RULER LAB (MISCELLANEOUS) ×3 IMPLANT
NEEDLE SUPERTRX PREMARK BIOPSY (NEEDLE) ×3 IMPLANT
NS IRRIG 1000ML POUR BTL (IV SOLUTION) ×3 IMPLANT
OIL SILICONE PENTAX (PARTS (SERVICE/REPAIRS)) ×3 IMPLANT
PAD ARMBOARD 7.5X6 YLW CONV (MISCELLANEOUS) ×6 IMPLANT
PATCHES PATIENT (LABEL) ×9 IMPLANT
SYR 20CC LL (SYRINGE) ×3 IMPLANT
SYR 20ML ECCENTRIC (SYRINGE) ×3 IMPLANT
SYR 30ML LL (SYRINGE) ×3 IMPLANT
SYR 5ML LL (SYRINGE) ×3 IMPLANT
SYSTEM GENCUT CORE BIOPSY (NEEDLE) ×3 IMPLANT
TOWEL OR 17X24 6PK STRL BLUE (TOWEL DISPOSABLE) ×3 IMPLANT
TRAP SPECIMEN MUCOUS 40CC (MISCELLANEOUS) ×3 IMPLANT
TUBE CONNECTING 20'X1/4 (TUBING) ×2
TUBE CONNECTING 20X1/4 (TUBING) ×4 IMPLANT

## 2014-11-10 NOTE — Anesthesia Postprocedure Evaluation (Signed)
  Anesthesia Post-op Note  Patient: Summer Williams  Procedure(s) Performed: Procedure(s): VIDEO BRONCHOSCOPY WITH ENDOBRONCHIAL NAVIGATION (N/A)  Patient Location: PACU  Anesthesia Type:General  Level of Consciousness: awake and alert   Airway and Oxygen Therapy: Patient Spontanous Breathing  Post-op Pain: none  Post-op Assessment: Post-op Vital signs reviewed, Patient's Cardiovascular Status Stable and Respiratory Function Stable  Post-op Vital Signs: Reviewed  Filed Vitals:   11/10/14 1025  BP: 132/67  Pulse: 82  Temp: 36.7 C  Resp: 24    Complications: No apparent anesthesia complications

## 2014-11-10 NOTE — Transfer of Care (Signed)
Immediate Anesthesia Transfer of Care Note  Patient: Summer Williams  Procedure(s) Performed: Procedure(s): VIDEO BRONCHOSCOPY WITH ENDOBRONCHIAL NAVIGATION (N/A)  Patient Location: PACU  Anesthesia Type:General  Level of Consciousness: awake, oriented and patient cooperative  Airway & Oxygen Therapy: Patient Spontanous Breathing and Patient connected to nasal cannula oxygen  Post-op Assessment: Report given to RN and Post -op Vital signs reviewed and stable  Post vital signs: Reviewed  Last Vitals:  Filed Vitals:   11/10/14 0548  BP: 136/60  Pulse: 72  Temp: 36.7 C  Resp: 16    Complications: No apparent anesthesia complications

## 2014-11-10 NOTE — Anesthesia Procedure Notes (Signed)
Procedure Name: Intubation Date/Time: 11/10/2014 7:33 AM Performed by: Jenne Campus Pre-anesthesia Checklist: Patient identified, Emergency Drugs available, Suction available, Patient being monitored and Timeout performed Patient Re-evaluated:Patient Re-evaluated prior to inductionOxygen Delivery Method: Circle system utilized Preoxygenation: Pre-oxygenation with 100% oxygen Intubation Type: IV induction Ventilation: Mask ventilation without difficulty and Oral airway inserted - appropriate to patient size Laryngoscope Size: Miller and 2 Grade View: Grade II Tube type: Oral Tube size: 8.5 mm Number of attempts: 1 Airway Equipment and Method: Stylet Placement Confirmation: ETT inserted through vocal cords under direct vision,  positive ETCO2,  CO2 detector and breath sounds checked- equal and bilateral Secured at: 22 cm Tube secured with: Tape Dental Injury: Teeth and Oropharynx as per pre-operative assessment

## 2014-11-10 NOTE — OR Nursing (Signed)
Late Entry: Fungus Culture with Smear added to Specimen C.  Orvan Seen in Lab is aware of change to specimen request.

## 2014-11-10 NOTE — Brief Op Note (Signed)
11/10/2014  9:41 AM  PATIENT:  Roselyn Meier  69 y.o. female  PRE-OPERATIVE DIAGNOSIS:  Right Upper Lobe Lung Mass  POST-OPERATIVE DIAGNOSIS:  Right Upper Lobe Lung Mass  PROCEDURE:  Procedure(s): VIDEO BRONCHOSCOPY WITH ENDOBRONCHIAL NAVIGATION (N/A)  Brushings, needle aspirations, biopsies and bronchoalveolar lavage  SURGEON:  Surgeon(s) and Role:    * Melrose Nakayama, MD - Primary    ANESTHESIA:   general  EBL:  Total I/O In: 1300 [I.V.:1300] Out: -   BLOOD ADMINISTERED:none  DRAINS: none   LOCAL MEDICATIONS USED:  NONE  SPECIMEN:  Source of Specimen:  RUL nodule  DISPOSITION OF SPECIMEN:  PATHOLOGY  PLAN OF CARE: Discharge to home after PACU  PATIENT DISPOSITION:  PACU - hemodynamically stable.   Delay start of Pharmacological VTE agent (>24hrs) due to surgical blood loss or risk of bleeding: not applicable  FINDINGS: Initial specimens showed inflammation, no tumor seen

## 2014-11-10 NOTE — Discharge Instructions (Signed)
Do not drive or engage in heavy physical activity for 24 hours  You may resume normal activities tomorrow  You may cough up small amounts of blood over the next few days  Call [phone] if you develop fever > 101, chest pain, shortness of breath or cough up large amounts of blood.  My office will contact you with a follow up appointment next week

## 2014-11-10 NOTE — Anesthesia Preprocedure Evaluation (Addendum)
Anesthesia Evaluation  Patient identified by MRN, date of birth, ID band Patient awake    Reviewed: Allergy & Precautions, H&P , NPO status , Patient's Chart, lab work & pertinent test results  Airway Mallampati: III  TM Distance: >3 FB Neck ROM: Full    Dental no notable dental hx. (+) Teeth Intact, Dental Advisory Given   Pulmonary COPD COPD inhaler, former smoker,  breath sounds clear to auscultation  Pulmonary exam normal       Cardiovascular negative cardio ROS  Rhythm:Regular Rate:Normal     Neuro/Psych Depression Schizophrenia negative neurological ROS     GI/Hepatic negative GI ROS, Neg liver ROS,   Endo/Other  negative endocrine ROS  Renal/GU negative Renal ROS  negative genitourinary   Musculoskeletal   Abdominal   Peds  Hematology negative hematology ROS (+)   Anesthesia Other Findings   Reproductive/Obstetrics negative OB ROS                            Anesthesia Physical Anesthesia Plan  ASA: II  Anesthesia Plan: General   Post-op Pain Management:    Induction: Intravenous  Airway Management Planned: Oral ETT  Additional Equipment:   Intra-op Plan:   Post-operative Plan: Extubation in OR  Informed Consent: I have reviewed the patients History and Physical, chart, labs and discussed the procedure including the risks, benefits and alternatives for the proposed anesthesia with the patient or authorized representative who has indicated his/her understanding and acceptance.   Dental advisory given  Plan Discussed with: CRNA  Anesthesia Plan Comments:         Anesthesia Quick Evaluation

## 2014-11-10 NOTE — Interval H&P Note (Signed)
History and Physical Interval Note:  11/10/2014 7:17 AM  Summer Williams  has presented today for surgery, with the diagnosis of RUL MASS  The various methods of treatment have been discussed with the patient and family. After consideration of risks, benefits and other options for treatment, the patient has consented to  Procedure(s): [place] (N/A) as a surgical intervention .  The patient's history has been reviewed, patient examined, no change in status, stable for surgery.  I have reviewed the patient's chart and labs.  Questions were answered to the patient's satisfaction.     Melrose Nakayama

## 2014-11-10 NOTE — H&P (View-Only) (Signed)
PCP is Elby Showers, MD Referring Provider is Thea Silversmith, MD  Chief Complaint  Patient presents with  . Lung Lesion    s/p breast cancer/radiation,,,CT CHEST 10/11/14    HPI: 69 year old former smoker sent for consultation regarding a right lung mass.  Summer Williams is a 69 year old woman with a 90-pack-year history of smoking, COPD, history of schizoaffective disorder, history of early stage breast cancer, and hyperlipidemia. She saw Dr. Annamaria Boots in February in regards to her COPD. A chest x-ray was done which showed an opacity in the right lung. A CT of the chest was done which showed a 3.9 x 3.4 by 2.3 cm groundglass opacity centered on the minor fissure. A repeat CT was done on 10/11/2014. The lesion was still present and possibly slightly larger although probably within the margin of error.  She smoked about 2 packs a day from age 69 until age 87. She was diagnosed with COPD several years ago. She has a prescription for Xopenex nebulizers, but does not use them on a regular basis. She does get short of breath with exertion, but says she can walk up a flight of stairs without stopping. She's lost about 15 pounds over the past year but has not lost any weight over the past 3 months. Her appetite is good. She was treated for breast cancer in the fall of 2015 with lumpectomy and radiation. She is on Arimidex for that currently. She said a screening recently showed blood in her stool and she scheduled to have a colonoscopy sometime in June.  Zubrod Score: At the time of surgery this patient's most appropriate activity status/level should be described as: '[]'$     0    Normal activity, no symptoms '[x]'$     1    Restricted in physical strenuous activity but ambulatory, able to do out light work '[]'$     2    Ambulatory and capable of self care, unable to do work activities, up and about >50 % of waking hours                              '[]'$     3    Only limited self care, in bed greater than 50% of  waking hours '[]'$     4    Completely disabled, no self care, confined to bed or chair '[]'$     5    Moribund    Past Medical History  Diagnosis Date  . Schizoaffective disorder   . History of alcohol abuse     last 1980  . Hyperlipidemia   . Vitamin D deficiency   . COPD (chronic obstructive pulmonary disease)   . Depression   . Skin cancer     Squamous  . Shortness of breath   . Wears glasses   . Requires supplemental oxygen     at night  . Breast cancer 01/05/14    left breast  . Radiation 03/23/14-04/19/14    Left breast    Past Surgical History  Procedure Laterality Date  . Eye surgery  2005    Cataract OD  . Cholecystectomy  2003  . Dental surgery      implants  . Breast surgery  02/09/2014    Left luimpectomy    Family History  Problem Relation Age of Onset  . Heart disease Father   . Lung cancer Maternal Grandfather   . Leukemia Paternal Grandfather   . Ovarian cancer Maternal  Aunt   . Breast cancer Maternal Aunt   . Colon cancer Neg Hx   . Colon polyps Neg Hx   . Esophageal cancer Neg Hx   . Kidney disease Neg Hx   . Gallbladder disease Neg Hx   . Cholelithiasis Mother     Social History History  Substance Use Topics  . Smoking status: Former Smoker -- 1.00 packs/day for 50 years    Types: Cigarettes    Quit date: 04/10/2006  . Smokeless tobacco: Never Used  . Alcohol Use: No     Comment: hx abuse-last 1980    Current Outpatient Prescriptions  Medication Sig Dispense Refill  . anastrozole (ARIMIDEX) 1 MG tablet Take 1 tablet (1 mg total) by mouth daily. 90 tablet 6  . atorvastatin (LIPITOR) 40 MG tablet TAKE 1 TABLET BY MOUTH EVERY DAY 90 tablet 1  . Cholecalciferol (VITAMIN D) 2000 UNITS CAPS Take by mouth daily.     . cholestyramine (QUESTRAN) 4 G packet Take 1 packet (4 g total) by mouth 2 (two) times daily. 180 each 3  . citalopram (CELEXA) 20 MG tablet Take 20 mg by mouth daily.      Marland Kitchen levalbuterol (XOPENEX) 0.63 MG/3ML nebulizer solution  Take 3 mLs (0.63 mg total) by nebulization every 6 (six) hours as needed for wheezing or shortness of breath. 120 mL 12  . Multiple Vitamins-Minerals (MULTIVITAMIN GUMMIES ADULT PO) Take by mouth daily. Pt takes 2 gummies, by mouth, every day    . OLANZapine (ZYPREXA) 7.5 MG tablet Take 7.5 mg by mouth at bedtime.      . Omega-3 Fatty Acids (FISH OIL) 1200 MG CAPS Take 1 capsule by mouth daily.    . Probiotic Product (PROBIOTIC DAILY PO) Take by mouth daily. Pt takes the following: 1. Align, 1 tablet, by mouth, every day 2. Probiotic gummy - 2 gummies, by mouth, every day ([facility] brand) 3. [facility] Brand - Probiotic - 1 tablet, by mouth, every day 4. Acidophilus Probiotic - 1 tablet, by mouth, every day 5. Florastor-1 tab twice a day    . Lactobacillus (ACIDOPHILUS PO) Take by mouth daily.     No current facility-administered medications for this visit.    No Known Allergies  Review of Systems  Constitutional: Negative for activity change, appetite change and unexpected weight change.  Respiratory: Positive for shortness of breath. Negative for wheezing. Cough: occasional.        Home O2 at night  Gastrointestinal: Positive for diarrhea and blood in stool (micro).  Psychiatric/Behavioral: Positive for dysphoric mood.       Schizoaffective disorder  All other systems reviewed and are negative.   BP 127/71 mmHg  Pulse 73  Resp 18  Ht '5\' 4"'$  (1.626 m)  Wt 169 lb (76.658 kg)  BMI 28.99 kg/m2  SpO2 96% Physical Exam  Constitutional: She is oriented to person, place, and time. She appears well-developed and well-nourished. No distress.  HENT:  Head: Normocephalic and atraumatic.  Eyes: EOM are normal. Pupils are equal, round, and reactive to light.  Neck: Neck supple. No thyromegaly present.  Cardiovascular: Normal rate and regular rhythm.  Exam reveals no friction rub.   No murmur heard. Pulmonary/Chest: Breath sounds normal. She has no wheezes. She has no rales.   Abdominal: Soft. There is no tenderness.  Musculoskeletal: She exhibits no edema.  Lymphadenopathy:    She has no cervical adenopathy.  Neurological: She is alert and oriented to person, place, and time. She has  normal strength. No cranial nerve deficit. She exhibits normal muscle tone.  Skin: Skin is warm and dry.  Psychiatric: She has a normal mood and affect.  Vitals reviewed.    Diagnostic Tests: CT CHEST WITHOUT CONTRAST  TECHNIQUE: Multidetector CT imaging of the chest was performed following the standard protocol without IV contrast.  COMPARISON: Chest CT 07/11/2014. No other comparison CT.  FINDINGS: Mediastinum/Nodes: There are no enlarged mediastinal, hilar, internal mammary or axillary lymph nodes.There is a stable small hiatal hernia. The heart size is normal. There is no pericardial effusion.There is atherosclerosis of the aorta, great vessels and coronary arteries.  Lungs/Pleura: There is no pleural effusion. There is a persistent irregular ground-glass density involving the right middle and lower lobes. This is difficult to measure on the axial images given its irregular shape, although measures approximately 3.4 x 2.4 cm on image 28. This lesion measures 4.2 cm on sagittal image 29 compared with 3.9 cm previously. There are minimal soft tissue components anteriorly. Underlying emphysema, central airway thickening and scattered nodularity in the right middle and lower lobes is stable. No other suspicious nodules demonstrated.  Upper abdomen: Stable appearance. 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  Musculoskeletal/Chest wall: Postsurgical changes in the left breast with skin thickening and subcutaneous edema are similar to the prior study. No focal chest wall mass or suspicious osseous lesion identified.  IMPRESSION: 1. Persistent irregular predominately ground-glass density in the right middle and lower lobes. This process may be  minimally larger with soft tissue components anteriorly. The persistence excludes an acute inflammatory process. Although potentially scarring from previous inflammation, this finding is now more concerning for adenomatous hyperplasia or early adenocarcinoma. Thoracic surgery consultation is recommended. PET/CT should be considered for staging purposes. These recommendations are taken from: Recommendations for the Management of Subsolid Pulmonary Nodules Detected at CT: A Statement from the [facility] 2013; 266:1, 304-317. 2. No evidence of metastatic breast cancer. 3. Stable emphysema and scattered subpleural nodularity in the right middle and lower lobes. 4. Stable probable radiation changes in the left breast.   Electronically Signed  By: Richardean Sale M.D.  On: 10/11/2014 14:57  Pulmonary function test 08/30/2012 FVC 1.69 (61%), post bronchodilator 1.96 (71%) FEV1 0.78 (40%), post bronchodilator 0.89 (45%) Vital capacity 1.82 (66%) DLCO 47% predicted  Impression: 69 year old woman with a history of tobacco abuse who has a spiculated groundglass opacity in the right upper and middle lobes centered on the minor fissure. There does appear to be a small soft tissue component as well. This is highly suspicious for an adenocarcinoma with lepidic growth. It also could be infectious or inflammatory in nature. However given the appearance of the lesion it needs to be considered lung cancer unless it can be proven otherwise.  I had a long discussion with Summer Williams and her son and daughter-in-law. I reviewed the CT images personally and concur with the radiologist's reading. I reviewed the images with the patient and her family. We discussed some of the technical aspects of her particular lesion. It crosses the fissure and is relatively central. Resection would most likely require right upper and middle bilobectomy. It does appear to originate from an upper lobe  bronchus, so a portion of the middle lobe might be potentially spared. Regardless her pulmonary function testing in 2014 I do not think she would be a candidate for surgical resection.  I emphasized the importance of establishing a diagnosis. I think the best way to do that in her case  is with a navigational bronchoscopy. There is an airway leading directly into the lesion so we should be able to get good samples. I did discuss the alternative of a CT-guided biopsy  I discussed the general nature of the procedure. We would do it in the operating room under general anesthesia. This is an endoscopic procedure and does not require an incision. We would do it on an outpatient basis. I informed her of the indications, risks, benefits, and alternatives. She understands that the risks include, but are not limited to death, MI, DVT PE, bleeding, pneumothorax, failure to make a diagnosis.  She and her family had a numerous questions about treatment options including chemotherapy, radiation therapy, and surgery. I answered those the best my ability, however I stressed that we'll have enough information to make any definitive determination at this time.  Plan: 1. Pulmonary function testing with and without bronchodilators  2. Electromagnetic navigational bronchoscopy to biopsy the right upper lobe lesion on Friday, 11/10/2014  Melrose Nakayama, MD [facility] [phone]  I spent 45 minutes face to face with Summer Williams

## 2014-11-10 NOTE — Op Note (Signed)
Summer Williams, Summer Williams             ACCOUNT NO.:  [id number]  MEDICAL RECORD NO.:  [id number]  LOCATION:  [facility]                         FACILITY:  [facility]  PHYSICIAN:  Revonda Standard. Roxan Hockey, M.D.DATE OF BIRTH:  Apr 29, 1946  DATE OF PROCEDURE:  11/10/2014 DATE OF DISCHARGE:  11/10/2014                              OPERATIVE REPORT   PREOPERATIVE DIAGNOSIS:  Right upper lobe mass.  POSTOPERATIVE DIAGNOSIS:  Right upper lobe mass.  PROCEDURE:  Electromagnetic navigational bronchoscopy with needle aspirations, brushings, biopsies, and bronchoalveolar lavage.  SURGEON:  Revonda Standard. Roxan Hockey, M.D.  ASSISTANT:  None.  ANESTHESIA:  General.  FINDINGS:  Sheath navigated to within 1 cm of the mass.  Initial brushings and needle aspiration showed inflammatory changes.  Initial frozen section showed inflammatory changes.  No tumor was seen.  No definite granulomas.  CLINICAL NOTE:  Summer Williams is a 69 year old woman with a history of tobacco abuse and early stage breast cancer.  She had a chest x-ray earlier this year, which showed an opacity in the right lung.  CT of the chest showed a 3.9 x 3.4 x 2.3 cm ground-glass opacity.  Repeat CT 3 months later showed the lesion was still present and possibly slightly larger.  She was advised to undergo bronchoscopic sampling for diagnostic purposes.  The indications, risks, benefits, and alternatives of the procedure were discussed in detail with the patient.  She understood and accepted the risks and agreed to proceed.  OPERATIVE NOTE:  Summer Williams was brought to the operating room on November 10, 2014.  She had induction of general anesthesia and was intubated. Flexible fiberoptic bronchoscopy was performed via the endotracheal tube.  There was normal endobronchial anatomy and no endobronchial lesions were seen to the level of the subsegmental bronchi.  There were some clear secretions.  The locatable guide for the navigational bronchoscopy then  was advanced through the bronchoscope and registration was performed and there was excellent correlation between the virtual and video bronchoscopy.  The bronchoscope then was navigated to the right upper lobe bronchus and the appropriate subsegmental bronchus was cannulated.  The locatable guide was advanced to within 1 cm of the lesion. Sampling then was performed.  The locatable guide was periodically reinserted during sampling to ensure good alignment and good proximity to the lesion.  Needle aspirations were performed and brushings were taken with a needle brush, these specimens were sent to Pathology for immediate evaluation.  While awaiting those results, multiple biopsies were taken.  There was some minor bleeding with biopsies.  The quick prep of the slides from the needle aspirations and the brushings showed inflammatory changes.  There was no tumor seen.  Additional biopsies were taken and sent for AFB and fungal cultures and multiple additional biopsies were taken for additional pathology.  A few small pieces of biopsy were sent for frozen section, which again showed only inflammatory changes and no evidence of cancer.  Biopsies then were performed with Gen Cut needle and these specimens were sent for permanent cytology.  Finally, bronchoalveolar lavage was performed, one specimen was sent for cytology, a second was sent for AFB and fungal cultures.  The washings were also sent for culture.  The  navigational sheath was removed.  A final inspection was made.  There was no ongoing bleeding.  The bronchoscope was removed.  The patient was extubated in the operating room and taken to the postanesthetic care unit in good condition.     Revonda Standard Roxan Hockey, M.D.     SCH/MEDQ  D:  11/10/2014  T:  11/10/2014  Job:  [id number]

## 2014-11-12 LAB — CULTURE, RESPIRATORY W GRAM STAIN

## 2014-11-12 LAB — CULTURE, RESPIRATORY
CULTURE: NO GROWTH
Gram Stain: NONE SEEN

## 2014-11-13 ENCOUNTER — Encounter (HOSPITAL_COMMUNITY): Payer: Self-pay | Admitting: Thoracic Surgery (Cardiothoracic Vascular Surgery)

## 2014-11-14 ENCOUNTER — Encounter: Payer: Medicare HMO | Admitting: Internal Medicine

## 2014-11-14 ENCOUNTER — Ambulatory Visit (INDEPENDENT_AMBULATORY_CARE_PROVIDER_SITE_OTHER): Payer: PPO | Admitting: Internal Medicine

## 2014-11-14 ENCOUNTER — Encounter: Payer: Self-pay | Admitting: Internal Medicine

## 2014-11-14 VITALS — BP 108/64 | HR 84 | Temp 97.4°F | Ht 64.0 in | Wt 166.0 lb

## 2014-11-14 DIAGNOSIS — F259 Schizoaffective disorder, unspecified: Secondary | ICD-10-CM

## 2014-11-14 DIAGNOSIS — R918 Other nonspecific abnormal finding of lung field: Secondary | ICD-10-CM | POA: Diagnosis not present

## 2014-11-14 DIAGNOSIS — E559 Vitamin D deficiency, unspecified: Secondary | ICD-10-CM

## 2014-11-14 DIAGNOSIS — Z79899 Other long term (current) drug therapy: Secondary | ICD-10-CM

## 2014-11-14 DIAGNOSIS — Z8709 Personal history of other diseases of the respiratory system: Secondary | ICD-10-CM | POA: Diagnosis not present

## 2014-11-14 DIAGNOSIS — R5383 Other fatigue: Secondary | ICD-10-CM

## 2014-11-14 DIAGNOSIS — Z23 Encounter for immunization: Secondary | ICD-10-CM

## 2014-11-14 DIAGNOSIS — E785 Hyperlipidemia, unspecified: Secondary | ICD-10-CM | POA: Diagnosis not present

## 2014-11-14 DIAGNOSIS — C50919 Malignant neoplasm of unspecified site of unspecified female breast: Secondary | ICD-10-CM | POA: Diagnosis not present

## 2014-11-14 DIAGNOSIS — Z0000 Encounter for general adult medical examination without abnormal findings: Secondary | ICD-10-CM | POA: Diagnosis not present

## 2014-11-14 LAB — TISSUE CULTURE
Culture: NO GROWTH
GRAM STAIN: NONE SEEN

## 2014-11-14 LAB — POCT URINALYSIS DIPSTICK
Bilirubin, UA: NEGATIVE
Blood, UA: NEGATIVE
Glucose, UA: NEGATIVE
Ketones, UA: NEGATIVE
Leukocytes, UA: NEGATIVE
Nitrite, UA: NEGATIVE
Protein, UA: NEGATIVE
Spec Grav, UA: 1.015
Urobilinogen, UA: NEGATIVE
pH, UA: 5

## 2014-11-14 LAB — CBC WITH DIFFERENTIAL/PLATELET
BASOS ABS: 0 10*3/uL (ref 0.0–0.1)
Basophils Relative: 0 % (ref 0–1)
EOS PCT: 0 % (ref 0–5)
Eosinophils Absolute: 0 10*3/uL (ref 0.0–0.7)
HEMATOCRIT: 41 % (ref 36.0–46.0)
HEMOGLOBIN: 13.7 g/dL (ref 12.0–15.0)
LYMPHS PCT: 27 % (ref 12–46)
Lymphs Abs: 1.5 10*3/uL (ref 0.7–4.0)
MCH: 30.6 pg (ref 26.0–34.0)
MCHC: 33.4 g/dL (ref 30.0–36.0)
MCV: 91.5 fL (ref 78.0–100.0)
MONOS PCT: 30 % — AB (ref 3–12)
MPV: 10.3 fL (ref 8.6–12.4)
Monocytes Absolute: 1.7 10*3/uL — ABNORMAL HIGH (ref 0.1–1.0)
Neutro Abs: 2.5 10*3/uL (ref 1.7–7.7)
Neutrophils Relative %: 43 % (ref 43–77)
PLATELETS: 112 10*3/uL — AB (ref 150–400)
RBC: 4.48 MIL/uL (ref 3.87–5.11)
RDW: 14 % (ref 11.5–15.5)
WBC: 5.7 10*3/uL (ref 4.0–10.5)

## 2014-11-14 LAB — COMPLETE METABOLIC PANEL WITHOUT GFR
ALT: 19 U/L (ref 0–35)
AST: 21 U/L (ref 0–37)
Albumin: 4.4 g/dL (ref 3.5–5.2)
Alkaline Phosphatase: 101 U/L (ref 39–117)
BUN: 9 mg/dL (ref 6–23)
CO2: 27 meq/L (ref 19–32)
Calcium: 9.4 mg/dL (ref 8.4–10.5)
Chloride: 104 meq/L (ref 96–112)
Creat: 0.75 mg/dL (ref 0.50–1.10)
GFR, Est African American: >89 mL/min
GFR, Est Non African American: 81 mL/min
Glucose, Bld: 98 mg/dL (ref 70–99)
Potassium: 4.1 meq/L (ref 3.5–5.3)
Sodium: 141 meq/L (ref 135–145)
Total Bilirubin: 1.1 mg/dL (ref 0.2–1.2)
Total Protein: 6.9 g/dL (ref 6.0–8.3)

## 2014-11-14 LAB — LIPID PANEL
Cholesterol: 154 mg/dL (ref 0–200)
HDL: 72 mg/dL
LDL Cholesterol: 40 mg/dL (ref 0–99)
Total CHOL/HDL Ratio: 2.1 ratio
Triglycerides: 210 mg/dL — ABNORMAL HIGH
VLDL: 42 mg/dL — ABNORMAL HIGH (ref 0–40)

## 2014-11-14 LAB — TSH: TSH: 2.031 u[IU]/mL (ref 0.350–4.500)

## 2014-11-14 NOTE — Patient Instructions (Signed)
Keep follow-up up appointment with oncologist in July. Prevnar given today. Fasting labs drawn and pending including fasting lipid panel, TSH, vitamin D level.

## 2014-11-14 NOTE — Progress Notes (Signed)
Subjective:    Patient ID: Summer Williams, female    DOB: Sep 28, 1945, 69 y.o.   MRN: [id number]  HPI 69 year old Female just recently had right lung biopsy for abnormal CT scan by Dr. Roxan Hockey on June 10. Pathology is pending. She has appointment to see oncologist at [facility] for follow-up of breast cancer in mid July. She had an issue recently with chronic diarrhea that has responded to Questran twice daily. She had a colonoscopy by Dr. Deatra Ina June 8 showing diverticulosis and internal hemorrhoids but no polyps or tumors. Screening for celiac disease was negative.  She is followed by Dr. Annamaria Boots for COPD. He did a chest x-ray in February showing opacity in the right lung. CT of the chest showed a 3.9 x 3.4 x 2.3 cm groundglass opacity centered on the minor fissure. Repeat CT was done in May. Lesion was still present and possibly slightly larger.  She was treated for breast cancer in the fall of 2015 with lumpectomy and radiation. She is currently on Arimidex.  She has a history of hyperlipidemia on statin therapy. History of elevated liver enzymes with workup showing fatty liver. Statin therapy was not thought to be the cause of elevated liver enzymes. Upper abdominal ultrasound done November 2012 showed mild diffuse fatty liver. She is obese.  She is not really motivated to diet and exercise.  History of vitamin D deficiency.  History of alcohol abuse in the remote past. Cholecystectomy done 2003 in [place]. Cataract extraction right eye by Dr. Kathrin Penner in 2005.  Uses home oxygen at night. Has prescription for inhalers.  Tetanus immunization April 2011. Pneumovax immunization April 2011.  Patient under recently refused mammogram. Has refused Pap smear.  No known drug allergies  Social history: She previously lived in [place] where she worked as a Education officer, museum. She smoked up to a pack of cigarettes daily for many years but quit smoking in early 2007 after smoking for  about 50 years. Last alcoholic drink was in 2202. Patient is divorced. She previously resided with her mother until her mother died in 37. Her mother had a fall, was hospitalized and had failure to thrive and was placed in hospice therapy. Patient has a son and daughter-in-law who reside here in [place]. Patient's mother had dementia and was in her 55s when she passed away. Father died at age 69 of cardiac arrest. Patient says father had history of epilepsy. Grandmother died of suicide. Both grandfathers with history of cancer.  History of skin is oh affect of disorder. Has seen Dr. Caprice Beaver.         Review of Systems  Constitutional: Negative.   Psychiatric/Behavioral:       Affect is flat  All other systems reviewed and are negative.      Objective:   Physical Exam  Constitutional: She appears well-developed and well-nourished. No distress.  HENT:  Head: Normocephalic and atraumatic.  Right Ear: External ear normal.  Left Ear: External ear normal.  Mouth/Throat: Oropharynx is clear and moist. No oropharyngeal exudate.  Eyes: Conjunctivae and EOM are normal. Pupils are equal, round, and reactive to light. Right eye exhibits no discharge. Left eye exhibits no discharge. No scleral icterus.  Neck: Neck supple. No JVD present. No thyromegaly present.  Cardiovascular: Normal rate, regular rhythm and normal heart sounds.   No murmur heard. Pulmonary/Chest: Effort normal and breath sounds normal. No respiratory distress. She has no wheezes. She has no rales.  Does not want breast  exam  Abdominal: Soft. Bowel sounds are normal. She exhibits no distension. There is no tenderness. There is no rebound and no guarding.  Genitourinary:  Patient declines  Musculoskeletal: She exhibits no edema.  Neurological: She is alert. She has normal reflexes. No cranial nerve deficit. Coordination normal.  Skin: Skin is warm and dry. No rash noted. She is not diaphoretic.  Psychiatric: She has a  normal mood and affect. Her behavior is normal. Thought content normal.          Assessment & Plan:   Recent biopsy right upper lobe mass-pathology pending  History of breast cancer  COPD followed by Dr. Annamaria Boots  History of smoking  Hyperlipidemia  Schizoaffective disorder-stable treated with Zyprexa and Celexa  History of vitamin D deficiency  Diarrhea-improved with Questran  Plan: Return in 6 months. Continue same medications.           Subjective:   Patient presents for Medicare Annual/Subsequent preventive examination.  Review Past Medical/Family/Social: See above   Risk Factors  Current exercise habits: Sedentary Dietary issues discussed: Low fat low carb  Cardiac risk factors: Hyperlipidemia  Depression Screen  (Note: if answer to either of the following is "Yes", a more complete depression screening is indicated)   Over the past two weeks, have you felt down, depressed or hopeless? No  Over the past two weeks, have you felt little interest or pleasure in doing things? No Have you lost interest or pleasure in daily life? No Do you often feel hopeless? No Do you cry easily over simple problems? No   Activities of Daily Living  In your present state of health, do you have any difficulty performing the following activities?:   Driving? No  Managing money? No  Feeding yourself? No  Getting from bed to chair? No  Climbing a flight of stairs? No  Preparing food and eating?: No  Bathing or showering? No  Getting dressed: No  Getting to the toilet? No  Using the toilet:No  Moving around from place to place: No  In the past year have you fallen or had a near fall?:No  Are you sexually active? No  Do you have more than one partner? No   Hearing Difficulties: No  Do you often ask people to speak up or repeat themselves? No  Do you experience ringing or noises in your ears? No  Do you have difficulty understanding soft or whispered voices? No  Do  you feel that you have a problem with memory? No Do you often misplace items? No    Home Safety:  Do you have a smoke alarm at your residence? Yes Do you have grab bars in the bathroom? No Do you have throw rugs in your house? Yes   Cognitive Testing  Alert? Yes Normal Appearance?Yes  Oriented to person? Yes Place? Yes  Time? Yes  Recall of three objects? Yes  Can perform simple calculations? Yes  Displays appropriate judgment?Yes  Can read the correct time from a watch face?Yes   List the Names of Other Physician/Practitioners you currently use:  See referral list for the physicians patient is currently seeing.  Oncologist  Dr. Leanora Ivanoff  Dr. Dana Allan   Review of Systems: See above   Objective:     General appearance: Appears stated age and mildly obese  Head: Normocephalic, without obvious abnormality, atraumatic  Eyes: conj clear, EOMi PEERLA  Ears: normal TM's and external ear canals both ears  Nose: Nares normal. Septum midline. Mucosa normal.  No drainage or sinus tenderness.  Throat: lips, mucosa, and tongue normal; teeth and gums normal  Neck: no adenopathy, no carotid bruit, no JVD, supple, symmetrical, trachea midline and thyroid not enlarged, symmetric, no tenderness/mass/nodules  No CVA tenderness.  Lungs: clear to auscultation bilaterally  Breasts: Declined exam Heart: regular rate and rhythm, S1, S2 normal, no murmur, click, rub or gallop  Abdomen: soft, non-tender; bowel sounds normal; no masses, no organomegaly  Musculoskeletal: ROM normal in all joints, no crepitus, no deformity, Normal muscle strengthen. Back  is symmetric, no curvature. Skin: Skin color, texture, turgor normal. No rashes or lesions  Lymph nodes: Cervical, supraclavicular, and axillary nodes normal.  Neurologic: CN 2 -12 Normal, Normal symmetric reflexes. Normal coordination and gait  Psych: Alert & Oriented x 3, Mood appear stable.    Assessment:    Annual wellness  medicare exam   Plan:    During the course of the visit the patient was educated and counseled about appropriate screening and preventive services including:   Annual mammogram  Prevnar given today     Patient Instructions (the written plan) was given to the patient.  Medicare Attestation  I have personally reviewed:  The patient's medical and social history  Their use of alcohol, tobacco or illicit drugs  Their current medications and supplements  The patient's functional ability including ADLs,fall risks, home safety risks, cognitive, and hearing and visual impairment  Diet and physical activities  Evidence for depression or mood disorders  The patient's weight, height, BMI, and visual acuity have been recorded in the chart. I have made referrals, counseling, and provided education to the patient based on review of the above and I have provided the patient with a written personalized care plan for preventive services.

## 2014-11-15 ENCOUNTER — Encounter (HOSPITAL_COMMUNITY): Payer: Self-pay | Admitting: Gastroenterology

## 2014-11-15 LAB — VITAMIN D 25 HYDROXY (VIT D DEFICIENCY, FRACTURES): VIT D 25 HYDROXY: 32 ng/mL (ref 30–100)

## 2014-11-17 ENCOUNTER — Ambulatory Visit (INDEPENDENT_AMBULATORY_CARE_PROVIDER_SITE_OTHER): Payer: PPO | Admitting: Thoracic Surgery (Cardiothoracic Vascular Surgery)

## 2014-11-17 ENCOUNTER — Encounter: Payer: Self-pay | Admitting: Thoracic Surgery (Cardiothoracic Vascular Surgery)

## 2014-11-17 ENCOUNTER — Telehealth: Payer: Self-pay | Admitting: Hematology and Oncology

## 2014-11-17 VITALS — BP 129/75 | HR 107 | Resp 20 | Ht 64.0 in | Wt 166.0 lb

## 2014-11-17 DIAGNOSIS — Z853 Personal history of malignant neoplasm of breast: Secondary | ICD-10-CM | POA: Diagnosis not present

## 2014-11-17 DIAGNOSIS — Z9889 Other specified postprocedural states: Secondary | ICD-10-CM | POA: Diagnosis not present

## 2014-11-17 DIAGNOSIS — R918 Other nonspecific abnormal finding of lung field: Secondary | ICD-10-CM

## 2014-11-17 DIAGNOSIS — C3411 Malignant neoplasm of upper lobe, right bronchus or lung: Secondary | ICD-10-CM

## 2014-11-17 NOTE — Progress Notes (Signed)
      [address]       [place] [address]             [phone]       Patient ID: Summer Williams, female   DOB: 04-Sep-1945, 69 y.o.   MRN: [id number]  [name] presents today for a scheduled follow-up visit to discuss results of her navigational bronchoscopy last week.  She is a 69 year old woman with a 90-pack-year history of smoking, COPD, schizoaffective disorder, and a history of early stage breast cancer. A chest x-ray in February showed an opacity in the right lung. CT showed a 3.9 x 3.4 by 2.3 cm groundglass opacity centered on the minor fissure. A repeat CT was done on 10/11/2014. The lesion was still present and possibly slightly larger.  I did a navigational bronchoscopy on 11/10/2014. She did well with the procedure on the same day. The initial stains in the operating room showed only inflammatory disease. However, the final pathology showed adenocarcinoma consistent with a lung primary.  FINAL DIAGNOSIS Diagnosis 1. Lung, biopsy, right upper lobe - ADENOCARCINOMA, SEE COMMENT. 2. Lung, biopsy, right upper lobe - ADENOCARCINOMA, SEE COMMENT. Microscopic Comment 1. , 2. The adenocarcinoma demonstrates the following immunophenotype: TTF-1 - strong diffuse expression. cytokeratin 7 - strong diffuse expression. GCDFP - negative expression. estrogen receptor - weak, focal expression. Overall the morphology and immunophenotype are that of primary pulmonary adenocarcinoma. The history of primary mammary carcinoma is noted ([id number]). The case was reviewed with Dr Lyndon Code who concurs. (CRR:ecj 11/14/2014) Mali RUND DO Pathologist, Electronic Signature   Her FEV1 is 0.67 (29% of predicted) DLCO is 7.35 (30%). She is not a candidate for surgical resection.  My recommendation to [name] was that we refer her back to Dr. Pablo Ledger and Dr. Lindi Adie. She may be a candidate for SBRT.  She agrees with that plan.  Revonda Standard Roxan Hockey, MD [facility] [phone]

## 2014-11-17 NOTE — Telephone Encounter (Signed)
Received a call from [place] with Dr Roxan Hockey and patient has a new lung dx primary breast and needs to move up her 7/18 appointment with Dr Hildred Laser

## 2014-11-20 ENCOUNTER — Telehealth: Payer: Self-pay | Admitting: *Deleted

## 2014-11-20 NOTE — Telephone Encounter (Signed)
Reviewed lab work with patient. Patient states she has been diagnosed with lung cancer and will be getting radiation treatments

## 2014-11-21 ENCOUNTER — Telehealth: Payer: Self-pay | Admitting: Hematology and Oncology

## 2014-11-21 ENCOUNTER — Ambulatory Visit (HOSPITAL_BASED_OUTPATIENT_CLINIC_OR_DEPARTMENT_OTHER): Payer: PPO | Admitting: Hematology and Oncology

## 2014-11-21 VITALS — BP 106/66 | HR 87 | Temp 98.1°F | Resp 18 | Ht 64.0 in | Wt 164.8 lb

## 2014-11-21 DIAGNOSIS — C50312 Malignant neoplasm of lower-inner quadrant of left female breast: Secondary | ICD-10-CM

## 2014-11-21 DIAGNOSIS — C342 Malignant neoplasm of middle lobe, bronchus or lung: Secondary | ICD-10-CM | POA: Diagnosis not present

## 2014-11-21 DIAGNOSIS — Z170 Estrogen receptor positive status [ER+]: Secondary | ICD-10-CM

## 2014-11-21 DIAGNOSIS — Z79811 Long term (current) use of aromatase inhibitors: Secondary | ICD-10-CM

## 2014-11-21 NOTE — Telephone Encounter (Signed)
Gave avs & calendar for October. °

## 2014-11-21 NOTE — Assessment & Plan Note (Signed)
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 based on CT scan done on 10/11/2014, no mediastinal or hilar lymphadenopathy by CT findings, T2 N0 M0 stage IB  Pathology and radiology review: I discussed the pathology report as well as see CT scan findings. Recommendation: 1. Definitive radiation therapy with SBRT 2. I do not recommend adjuvant chemotherapy given her age and her frail health and modest benefit.  Return to clinic in 4 months for follow-up. I discussed with her that she will have a PET CT scan before and after radiation therapy to assess response to treatment. I will see her after the follow-up scan to review the result of radiation.

## 2014-11-21 NOTE — Progress Notes (Signed)
Thoracic Location of Tumor / Histology:right upper lobe lung mass.  Patient presented months ago with symptoms of: Pulmonologist wanted to do routine chest xray following breast radiation as patient has history of copd.Patient was asymptomatic.  Biopsies of Lung on 11/10/2014 FINAL DIAGNOSIS Diagnosis 1. Lung, biopsy, right upper lobe - ADENOCARCINOMA, SEE COMMENT. 2. Lung, biopsy, right upper lobe - ADENOCARCINOMA, SEE COMMENT.  Tobacco/Marijuana/Snuff/ETOH use:Former smoker.Quit 08/31/2005.Smoked 1 ppd for 50 years.  Past/Anticipated interventions by cardiothoracic surgery, if any:11/10/2014 Right upper lobe VIDEO BRONCHOSCOPY WITH ENDOBRONCHIAL NAVIGATION by Dr.Hendrickson.  Past/Anticipated interventions by medical oncology, if any:  Signs/Symptoms  Weight changes, if any: No weight changes.  Respiratory complaints, if any: shortness of breath at all times greater on exertion.H/O COPD.  Hemoptysis, if any: No  Pain issues, if any:No   SAFETY ISSUES:  Prior radiation?Yes Left breast  03/23/14-04/19/14 directed care by Dr.Stacy Pablo Ledger.  Pacemaker/ICD? No  Possible current pregnancy?No.post-menopausal  Is the patient on methotrexate?No  Current Complaints / other details:Schizoaffective disorder.On anastrozole. BP 140/68 mmHg  Pulse 79  Temp(Src) 97.7 F (36.5 C)  Wt 166 lb 3.2 oz (75.388 kg)  SpO2 95%

## 2014-11-21 NOTE — Assessment & Plan Note (Signed)
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 Bone density shows normal bones. Density will be done October 2017.  Breast cancer surveillance: Mammograms once a year along with physical exams

## 2014-11-21 NOTE — Progress Notes (Signed)
Patient Care Team: Elby Showers, MD as PCP - General (Internal Medicine) Nicholas Lose, MD as Consulting Physician (Hematology and Oncology) Erroll Luna, MD as Consulting Physician (General Surgery) Thea Silversmith, MD as Consulting Physician (Radiation Oncology)  DIAGNOSIS: Cancer of lower-inner quadrant of left female breast   Staging form: Breast, AJCC 7th Edition     Clinical: Stage IA (T1c, N0, cM0) - Signed by Rulon Eisenmenger, MD on 01/18/2014     Pathologic: No stage assigned - Unsigned   SUMMARY OF ONCOLOGIC HISTORY:   Cancer of lower-inner quadrant of left female breast   01/05/2014 Initial Diagnosis Ultrasound-guided core needle biopsy: Cancer of lower-inner quadrant of left female breast, ER 100%, PR 90%, Ki-67: 30%, HER-2 negative (Ratio 1.02), grade 2   01/13/2014 Breast MRI 1.6 cm left breast abnormality, no abnormal lymph nodes.   02/09/2014 Surgery Left breast lumpectomy: Invasive grade 2 ductal carcinoma 1.5 cm with low-grade grade DCIS posterior aspect of the inferior margin close (0.15 cm), 3 SLN negative, Oncotype DX recurrence score 9 low risk (7% ROR)   03/13/2014 - 05/02/2014 Radiation Therapy Adjuvant radiation therapy   06/02/2014 -  Anti-estrogen oral therapy Arimidex 1 mg daily, plan is for 5 years of treatment    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   11/10/2014 Initial Diagnosis Right lung biopsy adenocarcinoma TTF-1 strong diffuse expression, CK 7 strong diffuse expression GCDFP negative; estrogen receptor weak    CHIEF COMPLIANT:  Newly diagnosed lung adenocarcinoma  INTERVAL HISTORY: Summer Williams is a 69 year old with above-mentioned history of right lung incidental finding of a mass that was was evaluated with a CT scan that revealed 4.2 cm right middle and lower lobe mass. This was biopsied and it showed adenocarcinoma was positive for TTF-1 and CK 7. She was sent to me for further discussion regarding treatment options. She denies any hemoptysis or  cough expectoration. She does have chronic shortness of breath and COPD.  REVIEW OF SYSTEMS:   Constitutional: Denies fevers, chills or abnormal weight loss Eyes: Denies blurriness of vision Ears, nose, mouth, throat, and face: Denies mucositis or sore throat Respiratory: Chronic shortness of breath and COPD Cardiovascular: Denies palpitation, chest discomfort or lower extremity swelling Gastrointestinal:  Denies nausea, heartburn or change in bowel habits Skin: Denies abnormal skin rashes Lymphatics: Denies new lymphadenopathy or easy bruising Neurological:Denies numbness, tingling or new weaknesses Behavioral/Psych: Mood is stable, no new changes  Breast:  denies any pain or lumps or nodules in either breasts All other systems were reviewed with the patient and are negative.  I have reviewed the past medical history, past surgical history, social history and family history with the patient and they are unchanged from previous note.  ALLERGIES:  has No Known Allergies.  MEDICATIONS:  Current Outpatient Prescriptions  Medication Sig Dispense Refill  . anastrozole (ARIMIDEX) 1 MG tablet Take 1 tablet (1 mg total) by mouth daily. 90 tablet 6  . atorvastatin (LIPITOR) 40 MG tablet TAKE 1 TABLET BY MOUTH EVERY DAY 90 tablet 1  . Cholecalciferol (VITAMIN D) 2000 UNITS CAPS Take 1 capsule by mouth daily.     . cholestyramine (QUESTRAN) 4 G packet Take 1 packet (4 g total) by mouth 2 (two) times daily. 180 each 3  . citalopram (CELEXA) 20 MG tablet Take 20 mg by mouth daily.      . Lactobacillus (ACIDOPHILUS PO) Take 1 tablet by mouth daily.     Marland Kitchen levalbuterol (XOPENEX) 0.63 MG/3ML nebulizer solution Take  3 mLs (0.63 mg total) by nebulization every 6 (six) hours as needed for wheezing or shortness of breath. 120 mL 12  . Multiple Vitamins-Minerals (MULTIVITAMIN GUMMIES ADULT PO) Take 2 tablets by mouth daily.     Marland Kitchen OLANZapine (ZYPREXA) 7.5 MG tablet Take 7.5 mg by mouth daily.  3  . Omega-3  Fatty Acids (FISH OIL) 1200 MG CAPS Take 1 capsule by mouth daily.    . Probiotic Product (ALIGN) 4 MG CAPS Take 1 tablet by mouth daily.    . Probiotic Product (PROBIOTIC DAILY PO) Take 2 tablets by mouth daily. Probiotic gummy    . saccharomyces boulardii (FLORASTOR) 250 MG capsule Take 250 mg by mouth 2 (two) times daily.     No current facility-administered medications for this visit.    PHYSICAL EXAMINATION: ECOG PERFORMANCE STATUS: 1 - Symptomatic but completely ambulatory  Filed Vitals:   11/21/14 1132  BP: 106/66  Pulse: 87  Temp: 98.1 F (36.7 C)  Resp: 18   Filed Weights   11/21/14 1132  Weight: 164 lb 12.8 oz (74.753 kg)    GENERAL:alert, no distress and comfortable SKIN: skin color, texture, turgor are normal, no rashes or significant lesions EYES: normal, Conjunctiva are pink and non-injected, sclera clear OROPHARYNX:no exudate, no erythema and lips, buccal mucosa, and tongue normal  NECK: supple, thyroid normal size, non-tender, without nodularity LYMPH:  no palpable lymphadenopathy in the cervical, axillary or inguinal LUNGS: Diminished breath sounds at the bases HEART: regular rate & rhythm and no murmurs and no lower extremity edema ABDOMEN:abdomen soft, non-tender and normal bowel sounds Musculoskeletal:no cyanosis of digits and no clubbing  NEURO: alert & oriented x 3 with fluent speech, no focal motor/sensory deficits   LABORATORY DATA:  I have reviewed the data as listed   Chemistry      Component Value Date/Time   NA 141 11/14/2014 1056   NA 142 01/18/2014 1233   K 4.1 11/14/2014 1056   K 4.2 01/18/2014 1233   CL 104 11/14/2014 1056   CO2 27 11/14/2014 1056   CO2 26 01/18/2014 1233   BUN 9 11/14/2014 1056   BUN 9.7 01/18/2014 1233   CREATININE 0.75 11/14/2014 1056   CREATININE 0.75 11/03/2014 1414   CREATININE 0.8 01/18/2014 1233      Component Value Date/Time   CALCIUM 9.4 11/14/2014 1056   CALCIUM 9.9 01/18/2014 1233   ALKPHOS 101  11/14/2014 1056   ALKPHOS 115 01/18/2014 1233   AST 21 11/14/2014 1056   AST 22 01/18/2014 1233   ALT 19 11/14/2014 1056   ALT 12 01/18/2014 1233   BILITOT 1.1 11/14/2014 1056   BILITOT 0.81 01/18/2014 1233       Lab Results  Component Value Date   WBC 5.7 11/14/2014   HGB 13.7 11/14/2014   HCT 41.0 11/14/2014   MCV 91.5 11/14/2014   PLT 112* 11/14/2014   NEUTROABS 2.5 11/14/2014   ASSESSMENT & PLAN:  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 based on CT scan done on 10/11/2014, no mediastinal or hilar lymphadenopathy by CT findings, T2 N0 M0 stage IB  Pathology and radiology review: I discussed the pathology report as well as see CT scan findings. Recommendation: 1. Definitive radiation therapy with SBRT 2. I do not recommend adjuvant chemotherapy given her age and her frail health and modest benefit.  Return to clinic in 4 months for follow-up. I discussed with her that she will have  a PET CT scan before and after radiation therapy to assess response to treatment. I will see her after the follow-up scan to review the result of radiation.  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 Bone density shows normal bones. Density will be done October 2017.  Breast cancer surveillance: Mammograms once a year along with physical exams   Orders Placed This Encounter  Procedures  . NM PET Image Initial (PI) Skull Base To Thigh    Standing Status: Future     Number of Occurrences:      Standing Expiration Date: 11/21/2015    Order Specific Question:  Reason for Exam (SYMPTOM  OR DIAGNOSIS  REQUIRED)    Answer:  Newly disgnosed adenocarcinoma lung initial staging    Order Specific Question:  Preferred imaging location?    Answer:  [facility]   The patient has a good understanding of the overall plan. she agrees with it. she will call with any problems that may develop before the next visit here.   Rulon Eisenmenger, MD

## 2014-11-22 ENCOUNTER — Ambulatory Visit
Admission: RE | Admit: 2014-11-22 | Discharge: 2014-11-22 | Disposition: A | Discharge status: Home / Self Care | Payer: PPO | Source: Ambulatory Visit | Attending: Radiation Oncology | Admitting: Radiation Oncology

## 2014-11-22 VITALS — BP 140/68 | HR 79 | Temp 97.7°F | Wt 166.2 lb

## 2014-11-22 DIAGNOSIS — Z510 Encounter for antineoplastic radiation therapy: Secondary | ICD-10-CM | POA: Diagnosis not present

## 2014-11-22 DIAGNOSIS — Z923 Personal history of irradiation: Secondary | ICD-10-CM | POA: Insufficient documentation

## 2014-11-22 DIAGNOSIS — C342 Malignant neoplasm of middle lobe, bronchus or lung: Secondary | ICD-10-CM

## 2014-11-22 DIAGNOSIS — Z79899 Other long term (current) drug therapy: Secondary | ICD-10-CM | POA: Diagnosis not present

## 2014-11-22 DIAGNOSIS — Z9981 Dependence on supplemental oxygen: Secondary | ICD-10-CM | POA: Insufficient documentation

## 2014-11-22 DIAGNOSIS — C50312 Malignant neoplasm of lower-inner quadrant of left female breast: Secondary | ICD-10-CM | POA: Diagnosis not present

## 2014-11-22 NOTE — Addendum Note (Signed)
Encounter addended by: Norm Salt, RN on: 11/22/2014  4:12 PM<BR>     Documentation filed: Charges VN

## 2014-11-22 NOTE — Progress Notes (Signed)
   Department of Radiation Oncology  Phone:  [phone] Fax:        [phone]   Name: Summer Williams MRN: [id number]  DOB: 1945-07-31  Date: 11/22/2014  Follow Up Visit Note  Diagnosis: Cancer of lower-inner quadrant of left female breast   Staging form: Breast, AJCC 7th Edition     Clinical: Stage IA (T1c, N0, cM0) - Signed by Rulon Eisenmenger, MD on 01/18/2014     Pathologic: No stage assigned - Unsigned  Summary and Interval since last radiation: Patient had previous radiation to the left breast on Nov. 20, 2015.  Interval History: Summer Williams presents today for routine followup. Patient is doing well from a breast standpoint. She has tolerated her Arimidex well. She recently had a CT scan which showed a 4.2 cm right lung mass.She had a biopsy which showed a adenocarcinoma which was positive for TTF-1 and CK 7. She was evaluated by Dr. Roxan Hockey who believes she isn't a good candidate for surgery. She uses oxygen at night. She denies hemoptysis or weight loss. A PET scan was also ordered. She is here for my opinion of radiation treatment for management of her disesase. She has no related breast complaints. She is due for a mammogram in July. She had a stool DNA which was positive for colon cancer but colonscopy was negative .  Physical Exam:  Filed Vitals:   11/22/14 1342  BP: 140/68  Pulse: 79  Temp: 97.7 F (36.5 C)  Weight: 166 lb 3.2 oz (75.388 kg)  SpO2: 95%  Alert and oriented.   IMPRESSION: Summer Williams is a 69 y.o. female with Stage 1 lung cancer in the setting of previous left breast cancer and radiation.  PLAN:  We discussed hypofractionated radiation as she is not a good surgical candidate. This provides excellent local control (>90%) albeit with a 10 -15% failure rate in the mediastinum. We discussed that we will be evaluating her lymph nodes with the PET scan. We will schedule her for simulation as soon as her PET is performed. We will be monitoring when the PET is  scheduled and contacting her with a sim appointment. She is to call back if she does not hear anything next week. We will have to take into account the left breast treatment in treatment planning. There should be on minimal side effects other than skin redness and fatigue. She signed informed consent. I will consult with Dr. Lindi Adie to see if he would like to order EGFR on her biopsy specimen.   This document serves as a record of services personally performed by Thea Silversmith , MD. It was created on her behalf by Jeralene Peters, a trained medical scribe. The creation of this record is based on the scribe's personal observations and the provider's statements to them. This document has been checked and approved by the attending provider.     Thea Silversmith, MD

## 2014-11-22 NOTE — Progress Notes (Signed)
Please see the Nurse Progress Note in the MD Initial Consult Encounter for this patient. 

## 2014-11-27 ENCOUNTER — Other Ambulatory Visit (HOSPITAL_COMMUNITY)
Admission: RE | Admit: 2014-11-27 | Discharge: 2014-11-27 | Disposition: A | Discharge status: Home / Self Care | Payer: PPO | Source: Ambulatory Visit | Attending: Internal Medicine | Admitting: Internal Medicine

## 2014-11-27 DIAGNOSIS — C342 Malignant neoplasm of middle lobe, bronchus or lung: Secondary | ICD-10-CM | POA: Insufficient documentation

## 2014-11-29 ENCOUNTER — Encounter (HOSPITAL_COMMUNITY)
Admission: RE | Admit: 2014-11-29 | Discharge: 2014-11-29 | Disposition: A | Discharge status: Home / Self Care | Payer: PPO | Source: Ambulatory Visit | Attending: Hematology and Oncology | Admitting: Hematology and Oncology

## 2014-11-29 DIAGNOSIS — C342 Malignant neoplasm of middle lobe, bronchus or lung: Secondary | ICD-10-CM

## 2014-11-29 LAB — GLUCOSE, CAPILLARY: GLUCOSE-CAPILLARY: 100 mg/dL — AB (ref 65–99)

## 2014-11-29 MED ORDER — FLUDEOXYGLUCOSE F - 18 (FDG) INJECTION
8.9000 | Freq: Once | INTRAVENOUS | Status: AC | PRN
Start: 2014-11-29 — End: 2014-11-29
  Administered 2014-11-29: 8.9 via INTRAVENOUS

## 2014-11-30 ENCOUNTER — Other Ambulatory Visit (HOSPITAL_COMMUNITY): Payer: Self-pay | Admitting: Internal Medicine

## 2014-12-05 ENCOUNTER — Ambulatory Visit
Admission: RE | Admit: 2014-12-05 | Discharge: 2014-12-05 | Disposition: A | Discharge status: Home / Self Care | Payer: PPO | Source: Ambulatory Visit | Attending: Radiation Oncology | Admitting: Radiation Oncology

## 2014-12-05 DIAGNOSIS — C50312 Malignant neoplasm of lower-inner quadrant of left female breast: Secondary | ICD-10-CM

## 2014-12-05 DIAGNOSIS — Z510 Encounter for antineoplastic radiation therapy: Secondary | ICD-10-CM | POA: Diagnosis not present

## 2014-12-05 NOTE — Progress Notes (Signed)
[facility] Simulation and Treatment Planning Note   Name: Summer Williams MRN: [id number]  Date: 12/05/2014  DOB: [birth year]  Status: outpatient    DIAGNOSIS: Cancer of lower-inner quadrant of left female breast   Staging form: Breast, AJCC 7th Edition     Clinical: Stage IA (T1c, N0, cM0) - Signed by Rulon Eisenmenger, MD on 01/18/2014     Pathologic: No stage assigned - Unsigned 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   Staging form: Lung, AJCC 7th Edition     Clinical stage from 11/22/2014: T2, N0, M0 - Signed by Thea Silversmith, MD on 11/22/2014     CONSENT VERIFIED: yes   SET UP AND IMMOBILIZATION: Patient is setup supine with arms in a wing board.   NARRATIVE: The patient was brought to the [facility].  Identity was confirmed.  All relevant records and images related to the planned course of therapy were reviewed.  Then, the patient was positioned in a stable reproducible clinical set-up for radiation therapy.  CT images were obtained.  Skin markings were placed.  The CT images were loaded into the planning software where the target and avoidance structures were contoured.  The radiation prescription was entered and confirmed.   TREATMENT PLANNING NOTE:  Treatment planning then occurred. I have requested 3D simulation with [facility] of the spinal cord, total lungs and gross tumor volume. I have also requested mlcs and an isodose plan.   A total of 4 complex treatment devices will be utilized in the form of unique MLCs for beam modification.

## 2014-12-06 ENCOUNTER — Other Ambulatory Visit: Payer: Self-pay | Admitting: Radiation Oncology

## 2014-12-06 ENCOUNTER — Other Ambulatory Visit: Payer: Self-pay

## 2014-12-06 DIAGNOSIS — C342 Malignant neoplasm of middle lobe, bronchus or lung: Secondary | ICD-10-CM

## 2014-12-06 DIAGNOSIS — C801 Malignant (primary) neoplasm, unspecified: Secondary | ICD-10-CM

## 2014-12-07 LAB — FUNGUS CULTURE W SMEAR
Fungal Smear: NONE SEEN
Fungal Smear: NONE SEEN

## 2014-12-11 DIAGNOSIS — Z510 Encounter for antineoplastic radiation therapy: Secondary | ICD-10-CM | POA: Diagnosis not present

## 2014-12-12 ENCOUNTER — Ambulatory Visit
Admission: RE | Admit: 2014-12-12 | Discharge: 2014-12-12 | Disposition: A | Discharge status: Home / Self Care | Payer: PPO | Source: Ambulatory Visit | Attending: Radiation Oncology | Admitting: Radiation Oncology

## 2014-12-12 ENCOUNTER — Telehealth: Payer: Self-pay | Admitting: Hematology and Oncology

## 2014-12-12 DIAGNOSIS — Z510 Encounter for antineoplastic radiation therapy: Secondary | ICD-10-CM | POA: Diagnosis not present

## 2014-12-12 NOTE — Telephone Encounter (Signed)
Patient stopped by today after appointment in radonc to reschedule her 7/18 appointment that was cxd. Patient thought 7/18 appointment with [facility] was cxd due to her 7/18 bx being cxd and rescheduled to 7/19. Appointment with [facility] was cancelled back on 6/21 when a new pof was sent for patient's next f/u appointment to be in 4 months. Confirmed with desk nurse patient is to f/u in 4 months - 10/19 as scheduled. Bx was not ordered by [facility] and if after bx is complete should patient need to see [facility] Dr. Pablo Ledger will inform [facility]. Patient made aware and given schedule for October.

## 2014-12-13 ENCOUNTER — Ambulatory Visit
Admission: RE | Admit: 2014-12-13 | Discharge: 2014-12-13 | Disposition: A | Discharge status: Home / Self Care | Payer: PPO | Source: Ambulatory Visit | Attending: Radiation Oncology | Admitting: Radiation Oncology

## 2014-12-13 DIAGNOSIS — C342 Malignant neoplasm of middle lobe, bronchus or lung: Secondary | ICD-10-CM

## 2014-12-13 DIAGNOSIS — Z510 Encounter for antineoplastic radiation therapy: Secondary | ICD-10-CM | POA: Diagnosis not present

## 2014-12-13 MED ORDER — BIAFINE EX EMUL
CUTANEOUS | Status: DC | PRN
  Administered 2014-12-13: 10:00:00 via TOPICAL

## 2014-12-14 ENCOUNTER — Ambulatory Visit
Admission: RE | Admit: 2014-12-14 | Discharge: 2014-12-14 | Disposition: A | Discharge status: Home / Self Care | Payer: PPO | Source: Ambulatory Visit | Attending: Radiation Oncology | Admitting: Radiation Oncology

## 2014-12-14 DIAGNOSIS — Z510 Encounter for antineoplastic radiation therapy: Secondary | ICD-10-CM | POA: Diagnosis not present

## 2014-12-15 ENCOUNTER — Ambulatory Visit
Admission: RE | Admit: 2014-12-15 | Discharge: 2014-12-15 | Disposition: A | Discharge status: Home / Self Care | Payer: PPO | Source: Ambulatory Visit | Attending: Radiation Oncology | Admitting: Radiation Oncology

## 2014-12-15 ENCOUNTER — Telehealth: Payer: Self-pay | Admitting: *Deleted

## 2014-12-15 DIAGNOSIS — C50312 Malignant neoplasm of lower-inner quadrant of left female breast: Secondary | ICD-10-CM

## 2014-12-15 DIAGNOSIS — Z510 Encounter for antineoplastic radiation therapy: Secondary | ICD-10-CM | POA: Diagnosis not present

## 2014-12-15 NOTE — Telephone Encounter (Signed)
[facility] called to say patient's sample failed a portion of sequencing. They are working with pathology to get another sample. Just wanted to make Dr Julien Nordmann aware.

## 2014-12-16 ENCOUNTER — Other Ambulatory Visit: Payer: Self-pay | Admitting: Radiology

## 2014-12-18 ENCOUNTER — Ambulatory Visit (HOSPITAL_COMMUNITY): Payer: PPO

## 2014-12-18 ENCOUNTER — Ambulatory Visit (HOSPITAL_COMMUNITY): Admission: RE | Admit: 2014-12-18 | Payer: PPO | Source: Ambulatory Visit

## 2014-12-18 ENCOUNTER — Ambulatory Visit: Payer: PPO | Admitting: Hematology and Oncology

## 2014-12-18 ENCOUNTER — Ambulatory Visit
Admission: RE | Admit: 2014-12-18 | Discharge: 2014-12-18 | Disposition: A | Discharge status: Home / Self Care | Payer: PPO | Source: Ambulatory Visit | Attending: Radiation Oncology | Admitting: Radiation Oncology

## 2014-12-18 ENCOUNTER — Other Ambulatory Visit: Payer: Self-pay | Admitting: Radiology

## 2014-12-18 DIAGNOSIS — Z510 Encounter for antineoplastic radiation therapy: Secondary | ICD-10-CM | POA: Diagnosis not present

## 2014-12-19 ENCOUNTER — Ambulatory Visit (HOSPITAL_COMMUNITY)
Admission: RE | Admit: 2014-12-19 | Discharge: 2014-12-19 | Disposition: A | Discharge status: Home / Self Care | Payer: PPO | Source: Ambulatory Visit | Attending: Radiation Oncology | Admitting: Radiation Oncology

## 2014-12-19 ENCOUNTER — Ambulatory Visit: Payer: PPO

## 2014-12-19 ENCOUNTER — Ambulatory Visit
Admission: RE | Admit: 2014-12-19 | Discharge: 2014-12-19 | Disposition: A | Discharge status: Home / Self Care | Payer: PPO | Source: Ambulatory Visit | Attending: Radiation Oncology | Admitting: Radiation Oncology

## 2014-12-19 ENCOUNTER — Ambulatory Visit (HOSPITAL_COMMUNITY): Admission: RE | Admit: 2014-12-19 | Payer: PPO | Source: Ambulatory Visit

## 2014-12-19 ENCOUNTER — Other Ambulatory Visit: Payer: Self-pay | Admitting: Radiation Oncology

## 2014-12-19 DIAGNOSIS — Z85118 Personal history of other malignant neoplasm of bronchus and lung: Secondary | ICD-10-CM | POA: Insufficient documentation

## 2014-12-19 DIAGNOSIS — C342 Malignant neoplasm of middle lobe, bronchus or lung: Secondary | ICD-10-CM

## 2014-12-19 DIAGNOSIS — R2232 Localized swelling, mass and lump, left upper limb: Secondary | ICD-10-CM | POA: Diagnosis present

## 2014-12-19 DIAGNOSIS — Z853 Personal history of malignant neoplasm of breast: Secondary | ICD-10-CM | POA: Diagnosis not present

## 2014-12-19 NOTE — Procedures (Signed)
Interventional Radiology Procedure Note  Procedure: US guided core biopsy of left upper arm nodule, FDG positive on pet ct.  3 x 18 core biopsy, with 1 core biopsy to lab for infection.  Complications: None Recommendations:  - Ok to shower tomorrow - Do not submerge for 7 days - Routine care   Signed,  Dulcy Fanny. Earleen Newport, DO

## 2014-12-20 ENCOUNTER — Ambulatory Visit
Admission: RE | Admit: 2014-12-20 | Discharge: 2014-12-20 | Disposition: A | Discharge status: Home / Self Care | Payer: PPO | Source: Ambulatory Visit | Attending: Radiation Oncology | Admitting: Radiation Oncology

## 2014-12-20 ENCOUNTER — Encounter: Payer: Self-pay | Admitting: Radiation Oncology

## 2014-12-20 DIAGNOSIS — Z510 Encounter for antineoplastic radiation therapy: Secondary | ICD-10-CM | POA: Diagnosis not present

## 2014-12-20 NOTE — Progress Notes (Signed)
Weekly Management Note Current Dose: 13  Gy  Projected Dose: 70.2 Gy   Narrative:  The patient presents for routine under treatment assessment.  CBCT/MVCT images/Port film x-rays were reviewed.  The chart was checked. Doing well. Biopsy negative for cancer (necrosis). (spoke to patient on phone)  Physical Findings: Weight:  . Unchanged  Impression:  The patient is tolerating radiation.  Plan:  Continue treatment as planned.

## 2014-12-21 ENCOUNTER — Ambulatory Visit
Admission: RE | Admit: 2014-12-21 | Discharge: 2014-12-21 | Disposition: A | Discharge status: Home / Self Care | Payer: PPO | Source: Ambulatory Visit | Attending: Radiation Oncology | Admitting: Radiation Oncology

## 2014-12-21 ENCOUNTER — Encounter (HOSPITAL_COMMUNITY): Payer: Self-pay

## 2014-12-21 DIAGNOSIS — Z510 Encounter for antineoplastic radiation therapy: Secondary | ICD-10-CM | POA: Diagnosis not present

## 2014-12-21 NOTE — Progress Notes (Signed)
Patient seen prior to treatment for assessment and patient education.Today is 6 out of 27 treatments to right chest.Given Radiation Therapy and You Booklet and biafine.Reviewed side effects to include fatigue, shortness of breath, cough, esophageal irritation and skin discoloration with possible itchy rash.Informed to apply biafine when changes developed and to bring cream with her so we may apply to back when needed.Drink fluids to stay hydrated, avoid acidic food and drink when irritation of esophagus develops.Knows to inform therapists if any problems to come to nursing for assessment. BP 119/82 mmHg  Pulse 93  Temp(Src) 98.4 F (36.9 C)  Resp 20  SpO2 93%

## 2014-12-22 ENCOUNTER — Ambulatory Visit
Admission: RE | Admit: 2014-12-22 | Discharge: 2014-12-22 | Disposition: A | Discharge status: Home / Self Care | Payer: PPO | Source: Ambulatory Visit | Attending: Radiation Oncology | Admitting: Radiation Oncology

## 2014-12-22 DIAGNOSIS — Z510 Encounter for antineoplastic radiation therapy: Secondary | ICD-10-CM | POA: Diagnosis not present

## 2014-12-22 LAB — TISSUE CULTURE: CULTURE: NO GROWTH

## 2014-12-24 LAB — AFB CULTURE WITH SMEAR (NOT AT ARMC)
ACID FAST SMEAR: NONE SEEN
Acid Fast Smear: NONE SEEN

## 2014-12-25 ENCOUNTER — Ambulatory Visit
Admission: RE | Admit: 2014-12-25 | Discharge: 2014-12-25 | Disposition: A | Discharge status: Home / Self Care | Payer: PPO | Source: Ambulatory Visit | Attending: Radiation Oncology | Admitting: Radiation Oncology

## 2014-12-25 DIAGNOSIS — Z510 Encounter for antineoplastic radiation therapy: Secondary | ICD-10-CM | POA: Diagnosis not present

## 2014-12-26 ENCOUNTER — Ambulatory Visit
Admission: RE | Admit: 2014-12-26 | Discharge: 2014-12-26 | Disposition: A | Discharge status: Home / Self Care | Payer: PPO | Source: Ambulatory Visit | Attending: Radiation Oncology | Admitting: Radiation Oncology

## 2014-12-26 VITALS — BP 138/75 | HR 68 | Temp 98.4°F | Wt 167.2 lb

## 2014-12-26 DIAGNOSIS — Z510 Encounter for antineoplastic radiation therapy: Secondary | ICD-10-CM | POA: Diagnosis not present

## 2014-12-26 DIAGNOSIS — C50312 Malignant neoplasm of lower-inner quadrant of left female breast: Secondary | ICD-10-CM

## 2014-12-26 NOTE — Progress Notes (Signed)
  Radiation Oncology         [phone] ________________________________  Name: Summer Williams MRN: [id number]  Date: 12/26/2014  DOB: 08-06-1945  Weekly Radiation Therapy Management  Stage 1 lung cancer in the setting of previous left breast cancer and radiation  Current Dose: 23.4 Gy     Planned Dose:  70.2 Gy  Narrative . . . . . . . . The patient presents for routine under treatment assessment.                                   The patient is without complaint. Denies pain. Shortness of breath same, has COPD. No cough. No trouble swallowing. No skin changes. Increased fatigue relieved with daily nap.                                  Set-up films were reviewed.                                 The chart was checked. Physical Findings. . .  weight is 167 lb 3.2 oz (75.841 kg). Her temperature is 98.4 F (36.9 C). Her blood pressure is 138/75 and her pulse is 68. Her oxygen saturation is 94%. . Weight essentially stable.  No significant changes. Heart has regular rhythm and rate. Lungs are clear. Impression . . . . . . . The patient is tolerating radiation. Plan . . . . . . . . . . . . Continue treatment as planned.  This document serves as a record of services personally performed by Gery Pray, MD. It was created on his behalf by Arlyce Harman, a trained medical scribe. The creation of this record is based on the scribe's personal observations and the provider's statements to them. This document has been checked and approved by the attending provider. ________________________________   Blair Promise, PhD, MD

## 2014-12-26 NOTE — Progress Notes (Signed)
Weekly assessment of radiation to right lung.Completed 9 of 27 fractions.Denies pain.Shortness of breath same as has COPD.No cough.No skin changes.Increased fatigue relieved with daily nap.

## 2014-12-27 ENCOUNTER — Ambulatory Visit
Admission: RE | Admit: 2014-12-27 | Discharge: 2014-12-27 | Disposition: A | Discharge status: Home / Self Care | Payer: PPO | Source: Ambulatory Visit | Attending: Radiation Oncology | Admitting: Radiation Oncology

## 2014-12-27 DIAGNOSIS — Z510 Encounter for antineoplastic radiation therapy: Secondary | ICD-10-CM | POA: Diagnosis not present

## 2014-12-28 ENCOUNTER — Ambulatory Visit
Admission: RE | Admit: 2014-12-28 | Discharge: 2014-12-28 | Disposition: A | Discharge status: Home / Self Care | Payer: PPO | Source: Ambulatory Visit | Attending: Radiation Oncology | Admitting: Radiation Oncology

## 2014-12-28 DIAGNOSIS — Z510 Encounter for antineoplastic radiation therapy: Secondary | ICD-10-CM | POA: Diagnosis not present

## 2014-12-29 ENCOUNTER — Ambulatory Visit
Admission: RE | Admit: 2014-12-29 | Discharge: 2014-12-29 | Disposition: A | Discharge status: Home / Self Care | Payer: PPO | Source: Ambulatory Visit | Attending: Radiation Oncology | Admitting: Radiation Oncology

## 2014-12-29 DIAGNOSIS — Z510 Encounter for antineoplastic radiation therapy: Secondary | ICD-10-CM | POA: Diagnosis not present

## 2015-01-01 ENCOUNTER — Ambulatory Visit
Admission: RE | Admit: 2015-01-01 | Discharge: 2015-01-01 | Disposition: A | Discharge status: Home / Self Care | Payer: PPO | Source: Ambulatory Visit | Attending: Radiation Oncology | Admitting: Radiation Oncology

## 2015-01-01 DIAGNOSIS — Z510 Encounter for antineoplastic radiation therapy: Secondary | ICD-10-CM | POA: Diagnosis not present

## 2015-01-02 ENCOUNTER — Ambulatory Visit
Admission: RE | Admit: 2015-01-02 | Discharge: 2015-01-02 | Disposition: A | Discharge status: Home / Self Care | Payer: PPO | Source: Ambulatory Visit | Attending: Radiation Oncology | Admitting: Radiation Oncology

## 2015-01-02 VITALS — BP 123/68 | HR 79 | Temp 97.9°F | Wt 166.9 lb

## 2015-01-02 DIAGNOSIS — C50312 Malignant neoplasm of lower-inner quadrant of left female breast: Secondary | ICD-10-CM

## 2015-01-02 DIAGNOSIS — Z510 Encounter for antineoplastic radiation therapy: Secondary | ICD-10-CM | POA: Diagnosis not present

## 2015-01-02 NOTE — Progress Notes (Signed)
Weekly assessment of radiation to right lung.Will complete 14 of 27 fractions.Noticed increased fatigue and shortness of breath.Denies pain.No skin changes.

## 2015-01-02 NOTE — Progress Notes (Signed)
Weekly Management Note Current Dose: 36.4  Gy  Projected Dose: 70.2 Gy   Narrative:  The patient presents for routine under treatment assessment.  CBCT/MVCT images/Port film x-rays were reviewed.  The chart was checked. She has stable sob and fatigue.  Physical Findings: Weight: 166 lb 14.4 oz (75.705 kg). No skin changes.  Impression:  The patient is tolerating radiation.  Plan:  Continue treatment as planned.  This document serves as a record of services personally performed by Thea Silversmith, MD. It was created on her behalf by Darcus Austin, a trained medical scribe. The creation of this record is based on the scribe's personal observations and the provider's statements to them. This document has been checked and approved by the attending provider.

## 2015-01-03 ENCOUNTER — Ambulatory Visit
Admission: RE | Admit: 2015-01-03 | Discharge: 2015-01-03 | Disposition: A | Discharge status: Home / Self Care | Payer: PPO | Source: Ambulatory Visit | Attending: Radiation Oncology | Admitting: Radiation Oncology

## 2015-01-03 DIAGNOSIS — Z510 Encounter for antineoplastic radiation therapy: Secondary | ICD-10-CM | POA: Diagnosis not present

## 2015-01-04 ENCOUNTER — Ambulatory Visit
Admission: RE | Admit: 2015-01-04 | Discharge: 2015-01-04 | Disposition: A | Discharge status: Home / Self Care | Payer: PPO | Source: Ambulatory Visit | Attending: Radiation Oncology | Admitting: Radiation Oncology

## 2015-01-04 DIAGNOSIS — Z510 Encounter for antineoplastic radiation therapy: Secondary | ICD-10-CM | POA: Diagnosis not present

## 2015-01-05 ENCOUNTER — Ambulatory Visit
Admission: RE | Admit: 2015-01-05 | Discharge: 2015-01-05 | Disposition: A | Discharge status: Home / Self Care | Payer: PPO | Source: Ambulatory Visit | Attending: Radiation Oncology | Admitting: Radiation Oncology

## 2015-01-05 DIAGNOSIS — Z510 Encounter for antineoplastic radiation therapy: Secondary | ICD-10-CM | POA: Diagnosis not present

## 2015-01-08 ENCOUNTER — Ambulatory Visit
Admission: RE | Admit: 2015-01-08 | Discharge: 2015-01-08 | Disposition: A | Discharge status: Home / Self Care | Payer: PPO | Source: Ambulatory Visit | Attending: Radiation Oncology | Admitting: Radiation Oncology

## 2015-01-08 DIAGNOSIS — Z510 Encounter for antineoplastic radiation therapy: Secondary | ICD-10-CM | POA: Diagnosis not present

## 2015-01-09 ENCOUNTER — Encounter: Payer: Self-pay | Admitting: Radiation Oncology

## 2015-01-09 ENCOUNTER — Ambulatory Visit
Admission: RE | Admit: 2015-01-09 | Discharge: 2015-01-09 | Disposition: A | Discharge status: Home / Self Care | Payer: PPO | Source: Ambulatory Visit | Attending: Radiation Oncology | Admitting: Radiation Oncology

## 2015-01-09 VITALS — BP 126/65 | HR 73 | Resp 16 | Wt 170.3 lb

## 2015-01-09 DIAGNOSIS — C50312 Malignant neoplasm of lower-inner quadrant of left female breast: Secondary | ICD-10-CM

## 2015-01-09 DIAGNOSIS — Z510 Encounter for antineoplastic radiation therapy: Secondary | ICD-10-CM | POA: Diagnosis not present

## 2015-01-09 NOTE — Progress Notes (Signed)
Weekly Management Note Current Dose: 46.8  Gy  Projected Dose: 70.2 Gy   Narrative:  The patient presents for routine under treatment assessment.  CBCT/MVCT images/Port film x-rays were reviewed.  The chart was checked. Doing well. Complains of difficulty swallowing last night x 1. Nothing today.   Physical Findings: Weight: 170 lb 4.8 oz (77.248 kg). Unchanged skin.   Impression:  The patient is tolerating radiation.  Plan:  Continue treatment as planned. Will prescribe carafate if needed.

## 2015-01-09 NOTE — Progress Notes (Signed)
Weight and vitals stable. Denies pain. Denies skin changes within treatment field. Reports using radiaplex bid as directed. Reports she is "very tired." Reports increased SOB. Denies cough. Reports a burning sensation when swallowing her food last night.   BP 126/65 mmHg  Pulse 73  Resp 16  Wt 170 lb 4.8 oz (77.248 kg)  SpO2 100% Wt Readings from Last 3 Encounters:  01/09/15 170 lb 4.8 oz (77.248 kg)  01/02/15 166 lb 14.4 oz (75.705 kg)  12/26/14 167 lb 3.2 oz (75.841 kg)

## 2015-01-10 ENCOUNTER — Ambulatory Visit
Admission: RE | Admit: 2015-01-10 | Discharge: 2015-01-10 | Disposition: A | Discharge status: Home / Self Care | Payer: PPO | Source: Ambulatory Visit | Attending: Radiation Oncology | Admitting: Radiation Oncology

## 2015-01-10 DIAGNOSIS — Z510 Encounter for antineoplastic radiation therapy: Secondary | ICD-10-CM | POA: Diagnosis not present

## 2015-01-11 ENCOUNTER — Ambulatory Visit
Admission: RE | Admit: 2015-01-11 | Discharge: 2015-01-11 | Disposition: A | Discharge status: Home / Self Care | Payer: PPO | Source: Ambulatory Visit | Attending: Radiation Oncology | Admitting: Radiation Oncology

## 2015-01-11 DIAGNOSIS — Z510 Encounter for antineoplastic radiation therapy: Secondary | ICD-10-CM | POA: Diagnosis not present

## 2015-01-12 ENCOUNTER — Ambulatory Visit
Admission: RE | Admit: 2015-01-12 | Discharge: 2015-01-12 | Disposition: A | Discharge status: Home / Self Care | Payer: PPO | Source: Ambulatory Visit | Attending: Radiation Oncology | Admitting: Radiation Oncology

## 2015-01-12 DIAGNOSIS — Z510 Encounter for antineoplastic radiation therapy: Secondary | ICD-10-CM | POA: Diagnosis not present

## 2015-01-15 ENCOUNTER — Ambulatory Visit
Admission: RE | Admit: 2015-01-15 | Discharge: 2015-01-15 | Disposition: A | Discharge status: Home / Self Care | Payer: PPO | Source: Ambulatory Visit | Attending: Radiation Oncology | Admitting: Radiation Oncology

## 2015-01-15 DIAGNOSIS — Z510 Encounter for antineoplastic radiation therapy: Secondary | ICD-10-CM | POA: Diagnosis not present

## 2015-01-16 ENCOUNTER — Encounter: Payer: Self-pay | Admitting: Radiation Oncology

## 2015-01-16 ENCOUNTER — Ambulatory Visit
Admission: RE | Admit: 2015-01-16 | Discharge: 2015-01-16 | Disposition: A | Discharge status: Home / Self Care | Payer: PPO | Source: Ambulatory Visit | Attending: Radiation Oncology | Admitting: Radiation Oncology

## 2015-01-16 VITALS — BP 110/62 | HR 101 | Temp 98.4°F | Resp 20 | Wt 167.8 lb

## 2015-01-16 DIAGNOSIS — Z510 Encounter for antineoplastic radiation therapy: Secondary | ICD-10-CM | POA: Diagnosis not present

## 2015-01-16 DIAGNOSIS — C50312 Malignant neoplasm of lower-inner quadrant of left female breast: Secondary | ICD-10-CM

## 2015-01-16 NOTE — Progress Notes (Signed)
Weekly Management Note Current Dose: 62.4  Gy  Projected Dose: 70.2 Gy   Narrative:  The patient presents for routine under treatment assessment.  CBCT/MVCT images/Port film x-rays were reviewed.  The chart was checked. Doing well. No complaints. Breathing stable.   Physical Findings: Weight: 167 lb 12.8 oz (76.114 kg). Unchanged skin. Alert and oriented.  Impression:  The patient is tolerating radiation.  Plan:  Continue treatment as planned.  Follow up in 1 month. Will discuss CT at that time.

## 2015-01-16 NOTE — Progress Notes (Addendum)
Weekly rad txs right lung, 24/27 completd, no skin changes, no coughing, nausea, room air 94%,  Has sob with exertion, no difficulty swallowing foods.fluids, no pain, appetite good, is fatigued, does take naps during the day  3:52 PM BP 110/62 mmHg  Pulse 101  Temp(Src) 98.4 F (36.9 C) (Oral)  Resp 20  Wt 167 lb 12.8 oz (76.114 kg)  SpO2 94%  Wt Readings from Last 3 Encounters:  01/16/15 167 lb 12.8 oz (76.114 kg)  01/09/15 170 lb 4.8 oz (77.248 kg)  01/02/15 166 lb 14.4 oz (75.705 kg)

## 2015-01-17 ENCOUNTER — Ambulatory Visit
Admission: RE | Admit: 2015-01-17 | Discharge: 2015-01-17 | Disposition: A | Discharge status: Home / Self Care | Payer: PPO | Source: Ambulatory Visit | Attending: Radiation Oncology | Admitting: Radiation Oncology

## 2015-01-17 DIAGNOSIS — Z510 Encounter for antineoplastic radiation therapy: Secondary | ICD-10-CM | POA: Diagnosis not present

## 2015-01-18 ENCOUNTER — Ambulatory Visit: Payer: PPO

## 2015-01-18 ENCOUNTER — Ambulatory Visit
Admission: RE | Admit: 2015-01-18 | Discharge: 2015-01-18 | Disposition: A | Discharge status: Home / Self Care | Payer: PPO | Source: Ambulatory Visit | Attending: Radiation Oncology | Admitting: Radiation Oncology

## 2015-01-18 DIAGNOSIS — Z510 Encounter for antineoplastic radiation therapy: Secondary | ICD-10-CM | POA: Diagnosis not present

## 2015-01-19 ENCOUNTER — Ambulatory Visit
Admission: RE | Admit: 2015-01-19 | Discharge: 2015-01-19 | Disposition: A | Discharge status: Home / Self Care | Payer: PPO | Source: Ambulatory Visit | Attending: Radiation Oncology | Admitting: Radiation Oncology

## 2015-01-19 DIAGNOSIS — C342 Malignant neoplasm of middle lobe, bronchus or lung: Secondary | ICD-10-CM

## 2015-01-19 DIAGNOSIS — Z510 Encounter for antineoplastic radiation therapy: Secondary | ICD-10-CM | POA: Diagnosis not present

## 2015-01-24 NOTE — Progress Notes (Signed)
  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   She had stable breathing symptoms and no skin changes.   Plan: The patient has completed radiation treatment. The patient will return to radiation oncology clinic for routine followup in one month. I advised them to call or return sooner if they have any questions or concerns related to their recovery or treatment.  ------------------------------------------------  Thea Silversmith, MD  This document serves as a record of services personally performed by Thea Silversmith, MD. It was created on her behalf by Derek Mound, a trained medical scribe. The creation of this record is based on the scribe's personal observations and the provider's statements to them. This document has been checked and approved by the attending provider.

## 2015-01-25 ENCOUNTER — Telehealth: Payer: Self-pay | Admitting: Internal Medicine

## 2015-01-25 NOTE — Telephone Encounter (Signed)
Spoke with Summer Williams at [facility], requesting last ov note be sent to below verified fax #.  This has been sent.  Nothing further needed.

## 2015-02-08 ENCOUNTER — Encounter: Payer: Self-pay | Admitting: Radiation Oncology

## 2015-03-01 ENCOUNTER — Ambulatory Visit
Admission: RE | Admit: 2015-03-01 | Discharge: 2015-03-01 | Disposition: A | Discharge status: Home / Self Care | Payer: PPO | Source: Ambulatory Visit | Attending: Radiation Oncology | Admitting: Radiation Oncology

## 2015-03-01 VITALS — BP 119/73 | HR 80 | Temp 97.8°F | Wt 165.5 lb

## 2015-03-01 DIAGNOSIS — C342 Malignant neoplasm of middle lobe, bronchus or lung: Secondary | ICD-10-CM

## 2015-03-01 NOTE — Progress Notes (Signed)
   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 The site and dose used included Right upper/middle lobe of lung, 2.6 Gy/frac for total of 70.2 Gy. The beams and energy used included 3D-Conformal, 6 and 10 MV photons.   Interval History: Summer Williams presents today for routine followup appointment with radiation oncology.  She is feeling well. She has no change in her breathing or skin changes. She had a negative mammogram in July.  She has a follow up with Dr. Lindi Adie in October.   Physical Exam: The patient is alert and oriented x3. There is no significant changes to the status of the paients overall health to be noted at this time. No skin changes. Normal breathing.  Filed Vitals:   03/01/15 1355  BP: 119/73  Pulse: 80  Temp: 97.8 F (36.6 C)  Weight: 165 lb 8 oz (75.07 kg)  SpO2: 95%    IMPRESSION: Summer Williams is a 69 y.o. female presenting to clinic in regards to her cancer of the left female breast. She is recovering from radiation therapy and appropriately managing reported symptoms.  The patient understands that she can access her appointments and medical records via Summer Williams.   PLAN:  I will order a CT without constrast for baseline now and see her back in 4 months.  She continues on anastrazole and has follow up with Dr.  Lindi Adie.  She has been advised of the importance of completing annual mammograms in regards to the best management of her disease.  All vocalized questions and concerns have been addressed. If the patient develops any further questions or concerns in regards to her treatment and recovery, she has been encouraged to contact Dr. Pablo Ledger, MD.    This document serves as a record of services personally performed by Thea Silversmith , MD. It was created on her behalf by Lenn Cal, a trained medical scribe. The creation of this record is based on the scribe's personal observations and the provider's statements to them. This document has been checked and approved by the attending provider.   ------------------------------------------------  Thea Silversmith, MD

## 2015-03-02 ENCOUNTER — Telehealth: Payer: Self-pay | Admitting: *Deleted

## 2015-03-02 NOTE — Telephone Encounter (Signed)
CALLED PATIENT TO INFORM OF CTS AND FU, SPOKE WITH PATIENT AND SHE IS AWARE OF THESE APPTS.

## 2015-03-12 ENCOUNTER — Ambulatory Visit (HOSPITAL_COMMUNITY)
Admission: RE | Admit: 2015-03-12 | Discharge: 2015-03-12 | Disposition: A | Discharge status: Home / Self Care | Payer: PPO | Source: Ambulatory Visit | Attending: Radiation Oncology | Admitting: Radiation Oncology

## 2015-03-12 DIAGNOSIS — I2510 Atherosclerotic heart disease of native coronary artery without angina pectoris: Secondary | ICD-10-CM | POA: Insufficient documentation

## 2015-03-12 DIAGNOSIS — R918 Other nonspecific abnormal finding of lung field: Secondary | ICD-10-CM | POA: Insufficient documentation

## 2015-03-12 DIAGNOSIS — J432 Centrilobular emphysema: Secondary | ICD-10-CM | POA: Diagnosis not present

## 2015-03-12 DIAGNOSIS — C342 Malignant neoplasm of middle lobe, bronchus or lung: Secondary | ICD-10-CM | POA: Insufficient documentation

## 2015-03-14 ENCOUNTER — Telehealth: Payer: Self-pay

## 2015-03-14 NOTE — Telephone Encounter (Signed)
Informed patient that ct of chest from 03/12/15 looks good per Dr.Wentworth.There is a decrease in size ofright middle and right upper lobe pulmonary nodule as result of therapy.informed we will call back with appointment for scan and follow up in 4 months.

## 2015-03-21 ENCOUNTER — Encounter: Payer: Self-pay | Admitting: Hematology and Oncology

## 2015-03-21 ENCOUNTER — Telehealth: Payer: Self-pay | Admitting: Hematology and Oncology

## 2015-03-21 ENCOUNTER — Ambulatory Visit (HOSPITAL_BASED_OUTPATIENT_CLINIC_OR_DEPARTMENT_OTHER): Payer: PPO | Admitting: Hematology and Oncology

## 2015-03-21 VITALS — BP 122/76 | HR 89 | Temp 97.8°F | Resp 18 | Ht 64.0 in | Wt 160.6 lb

## 2015-03-21 DIAGNOSIS — C50812 Malignant neoplasm of overlapping sites of left female breast: Secondary | ICD-10-CM | POA: Diagnosis not present

## 2015-03-21 DIAGNOSIS — C3411 Malignant neoplasm of upper lobe, right bronchus or lung: Secondary | ICD-10-CM

## 2015-03-21 DIAGNOSIS — C342 Malignant neoplasm of middle lobe, bronchus or lung: Secondary | ICD-10-CM

## 2015-03-21 DIAGNOSIS — C50312 Malignant neoplasm of lower-inner quadrant of left female breast: Secondary | ICD-10-CM

## 2015-03-21 NOTE — Addendum Note (Signed)
Addended by: Prentiss Bells on: 03/21/2015 06:01 PM   Modules accepted: Medications

## 2015-03-21 NOTE — Assessment & Plan Note (Signed)
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 Bone density shows normal bones. Density will be done October 2017.  Breast cancer surveillance: Mammograms 12/15/2014 were normal

## 2015-03-21 NOTE — Assessment & Plan Note (Signed)
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

## 2015-03-21 NOTE — Progress Notes (Signed)
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 ([place])   11/10/2014 Initial Diagnosis Right lung biopsy adenocarcinoma TTF-1 strong diffuse expression, CK 7 strong diffuse expression GCDFP negative; estrogen receptor weak   12/13/2014 - 01/19/2015 Radiation Therapy Right upper/middle lobe of lung, 2.6 Gy/frac for total of 70.2 Gy (definitive radiation)   03/12/2015  Imaging Right middle and upper lobe spiculated groundglass mass measures 2.7 x 2.23.3 cm previously was 3.4 x 2.44.2 cm, 3 mm right middle lobe nodule, chronic left lower lobe nodule 3 mm    CHIEF COMPLIANT: Follow-up after radiation to the lung  INTERVAL HISTORY: Summer Williams is a 69 year old with above-mentioned history of breast cancer but recently diagnosed right lung adenocarcinoma. She was too high risk for surgery and hence she received radiation to the right lung that was completed 01/19/2015. She underwent a CT of the chest on 03/12/2015 and is here today to discuss the results. Overall she has recovered from effect of radiation. Continues to have moderate fatigue. She continues to take antiestrogen therapy.  REVIEW OF SYSTEMS:   Constitutional: Denies fevers, chills or abnormal weight loss Eyes: Denies blurriness of vision Ears, nose, mouth, throat, and face: Denies mucositis or sore throat Respiratory: Mild chronic shortness of breath Cardiovascular: Denies palpitation, chest discomfort or lower extremity swelling Gastrointestinal:  Denies nausea, heartburn or change in bowel habits Skin: Denies abnormal skin rashes Lymphatics: Denies new lymphadenopathy or easy bruising Neurological:Denies numbness, tingling or new weaknesses Behavioral/Psych: Mood is stable, no new changes   All other systems were reviewed with the patient and are negative.  I have reviewed the past medical history, past surgical history, social history and family history with the patient and they are unchanged from previous note.  ALLERGIES:  has No Known Allergies.  MEDICATIONS:  Current Outpatient Prescriptions  Medication Sig Dispense Refill  . anastrozole (ARIMIDEX) 1 MG tablet Take 1 tablet (1 mg total)  by mouth daily. 90 tablet 6  . atorvastatin (LIPITOR) 40 MG tablet TAKE 1 TABLET BY MOUTH EVERY DAY 90 tablet 1  . Cholecalciferol (VITAMIN D) 2000 UNITS CAPS Take 1 capsule by mouth daily.      . cholestyramine (QUESTRAN) 4 G packet Take 1 packet (4 g total) by mouth 2 (two) times daily. 180 each 3  . citalopram (CELEXA) 20 MG tablet Take 20 mg by mouth daily.      . Lactobacillus (ACIDOPHILUS PO) Take 1 tablet by mouth daily.     Marland Kitchen levalbuterol (XOPENEX) 0.63 MG/3ML nebulizer solution Take 3 mLs (0.63 mg total) by nebulization every 6 (six) hours as needed for wheezing or shortness of breath. 120 mL 12  . Multiple Vitamins-Minerals (MULTIVITAMIN GUMMIES ADULT PO) Take 2 tablets by mouth daily.     Marland Kitchen OLANZapine (ZYPREXA) 7.5 MG tablet Take 7.5 mg by mouth daily.  3  . Omega-3 Fatty Acids (FISH OIL) 1200 MG CAPS Take 1 capsule by mouth daily.    . Probiotic Product (PROBIOTIC DAILY PO) Take 2 tablets by mouth daily. Probiotic gummy    . saccharomyces boulardii (FLORASTOR) 250 MG capsule Take 250 mg by mouth 2 (two) times daily.     No current facility-administered medications for this visit.    PHYSICAL EXAMINATION: ECOG PERFORMANCE STATUS: 1 - Symptomatic but completely ambulatory  Filed Vitals:   03/21/15 1128  BP: 122/76  Pulse: 89  Temp: 97.8 F (36.6 C)  Resp: 18   Filed Weights   03/21/15 1128  Weight: 160 lb 9.6 oz (72.848 kg)    GENERAL:alert, no distress and comfortable SKIN: skin color, texture, turgor are normal, no rashes or significant lesions EYES: normal, Conjunctiva are pink and non-injected, sclera clear OROPHARYNX:no exudate, no erythema and lips, buccal mucosa, and tongue normal  NECK: supple, thyroid normal size, non-tender, without nodularity LYMPH:  no palpable lymphadenopathy in the cervical, axillary or inguinal LUNGS: Diminished breath sounds at the bases HEART: regular rate & rhythm and no murmurs and no lower extremity edema ABDOMEN:abdomen soft, non-tender and normal bowel sounds Musculoskeletal:no cyanosis of digits and no clubbing  NEURO: alert & oriented x 3 with fluent speech, no focal motor/sensory deficits  LABORATORY DATA:  I  have reviewed the data as listed   Chemistry      Component Value Date/Time   NA 141 11/14/2014 1056   NA 142 01/18/2014 1233   K 4.1 11/14/2014 1056   K 4.2 01/18/2014 1233   CL 104 11/14/2014 1056   CO2 27 11/14/2014 1056   CO2 26 01/18/2014 1233   BUN 9 11/14/2014 1056   BUN 9.7 01/18/2014 1233   CREATININE 0.75 11/14/2014 1056   CREATININE 0.75 11/03/2014 1414   CREATININE 0.8 01/18/2014 1233      Component Value Date/Time   CALCIUM 9.4 11/14/2014 1056   CALCIUM 9.9 01/18/2014 1233   ALKPHOS 101 11/14/2014 1056   ALKPHOS 115 01/18/2014 1233   AST 21 11/14/2014 1056   AST 22 01/18/2014 1233   ALT 19 11/14/2014 1056   ALT 12 01/18/2014 1233   BILITOT 1.1 11/14/2014 1056   BILITOT 0.81 01/18/2014 1233       Lab Results  Component Value Date   WBC 5.7 11/14/2014   HGB 13.7 11/14/2014   HCT 41.0 11/14/2014   MCV 91.5 11/14/2014   PLT 112* 11/14/2014   NEUTROABS 2.5 11/14/2014   ASSESSMENT & PLAN:  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 Right  middle and lower lobe adenocarcinoma 4.2 cm based on CT scan done on 10/11/2014, no mediastinal or hilar lymphadenopathy by CT findings, T2 N0 M0 stage IB Status post definitive radiation therapy completed 01/19/2015 Patient is not a candidate for chemotherapy because of her performance status  CT chest 03/12/2015 Right middle and upper lobe spiculated groundglass mass measures 2.7 x 2.23.3 cm previously was 3.4 x 2.44.2 cm, 3 mm right middle lobe nodule, chronic left lower lobe nodule 3 mm  I will discuss with Dr. Pablo Ledger if a PET/CT scan may be useful in determining active disease versus scar tissue. It appears that there is an order for a CT of the chest to be done 07/31/2015.  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 Bone density shows normal bones. Density will be done October 2017.  Breast cancer surveillance: Mammograms 12/15/2014 were normal Return to clinic in March 2017 for follow-up  No orders of the defined types were placed in this encounter.   The patient has a good understanding of the overall plan. she agrees with it. she will call with any problems that may develop before the next visit here.   Rulon Eisenmenger, MD 03/21/2015

## 2015-03-21 NOTE — Telephone Encounter (Signed)
Appointments made and avs printed for pateint °

## 2015-04-18 ENCOUNTER — Ambulatory Visit: Payer: PPO | Admitting: Internal Medicine

## 2015-04-27 ENCOUNTER — Other Ambulatory Visit: Payer: Self-pay | Admitting: Internal Medicine

## 2015-04-27 NOTE — Telephone Encounter (Signed)
Refill once and have pt come in in December for OV lipid and liver functions. Not seen since June 2016

## 2015-05-26 ENCOUNTER — Other Ambulatory Visit: Payer: Self-pay | Admitting: Internal Medicine

## 2015-06-19 ENCOUNTER — Other Ambulatory Visit: Payer: Self-pay | Admitting: Hematology and Oncology

## 2015-06-19 DIAGNOSIS — C50312 Malignant neoplasm of lower-inner quadrant of left female breast: Secondary | ICD-10-CM

## 2015-06-20 ENCOUNTER — Telehealth: Payer: Self-pay

## 2015-06-20 NOTE — Telephone Encounter (Signed)
Try Magnesium 400 mg daily. Due for CPE mid June. May need OV to discuss leg cramps if magnesium does not work

## 2015-06-20 NOTE — Telephone Encounter (Signed)
Patient complains leg cramps at night and she has been doing some research and she thinks she has restless legs. She wants to know if she can take iron and magnesium to help with this at night or what is recommended.

## 2015-06-20 NOTE — Telephone Encounter (Signed)
Patient notified

## 2015-06-24 DIAGNOSIS — J449 Chronic obstructive pulmonary disease, unspecified: Secondary | ICD-10-CM | POA: Diagnosis not present

## 2015-06-28 ENCOUNTER — Encounter: Payer: Self-pay | Admitting: Radiation Oncology

## 2015-06-28 ENCOUNTER — Ambulatory Visit
Admission: RE | Admit: 2015-06-28 | Discharge: 2015-06-28 | Disposition: A | Discharge status: Home / Self Care | Payer: PPO | Source: Ambulatory Visit | Attending: Radiation Oncology | Admitting: Radiation Oncology

## 2015-06-28 NOTE — Progress Notes (Signed)
Skin status: No skin irritation to left breast, normal color Lotion being used: Using Vit E lotion at times to left breast,says she will start back using it more Have you seen your medical oncologist? Has an appointment 08-28-15 to see Dr. Linna Darner ER+,have started Arimidex started 03-2014 Discuss survivorship appointment:  None scheduled booklet given today, told she would be notified by the survivorship nurse Offer referral to [employer] and booklet given today Appetite:Good Pain:no Energy level:Fatigue a lot  Mammogram 04-2015    BP 140/80 mmHg  Pulse 88  Temp(Src) 98.3 F (36.8 C) (Oral)  Ht '5\' 4"'$  (1.626 m)  Wt 163 lb 11.2 oz (74.254 kg)  BMI 28.09 kg/m2  SpO2 95% Wt Readings from Last 3 Encounters:  06/28/15 163 lb 11.2 oz (74.254 kg)  03/21/15 160 lb 9.6 oz (72.848 kg)  03/01/15 165 lb 8 oz (75.07 kg)

## 2015-07-04 ENCOUNTER — Ambulatory Visit (INDEPENDENT_AMBULATORY_CARE_PROVIDER_SITE_OTHER): Payer: PPO | Admitting: Internal Medicine

## 2015-07-04 ENCOUNTER — Encounter: Payer: Self-pay | Admitting: Internal Medicine

## 2015-07-04 VITALS — BP 104/62 | HR 75 | Ht 64.0 in | Wt 165.0 lb

## 2015-07-04 DIAGNOSIS — J449 Chronic obstructive pulmonary disease, unspecified: Secondary | ICD-10-CM

## 2015-07-04 DIAGNOSIS — Z23 Encounter for immunization: Secondary | ICD-10-CM | POA: Diagnosis not present

## 2015-07-04 DIAGNOSIS — C342 Malignant neoplasm of middle lobe, bronchus or lung: Secondary | ICD-10-CM | POA: Diagnosis not present

## 2015-07-04 MED ORDER — UMECLIDINIUM-VILANTEROL 62.5-25 MCG/INH IN AEPB: INHALATION_SPRAY | RESPIRATORY_TRACT | Status: DC

## 2015-07-04 NOTE — Patient Instructions (Signed)
Order- walk test on room air    Dx COPD mixed type  Sample and printed script for Anoro Ellipta inhaler    1 inhalation, once daily

## 2015-07-04 NOTE — Assessment & Plan Note (Signed)
Nonspecific but mostly gradual increase in exertional dyspnea punctuated by a couple of mild colds. Consider possibility of mild radiation fibrosis bilaterally, but not seen on last imaging Plan-sample Anoro

## 2015-07-04 NOTE — Progress Notes (Signed)
07/21/12- 67 F former smoker-Self referral-COPD(pt states dx'd few years ago at [facility]) She complains of dyspnea on exertion, up and down stairs and relieved by rest. Episode of bronchitis at Christmas with shortness of breath. Had done pulmonary rehabilitation. She paces herself. She may wheeze a little. Cough at times, productive of white sputum with no chest pain, blood or fever. Little variation in exercise tolerance from day to day. Breathing does not wake her. Neither Advair nor pro air were much help. She denies history of heart disease, seasonal allergy or asthma. Past history of a pneumonia. Denies anemia, glaucoma or peripheral edema. 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 CXR12/19/13- IMPRESSION:  No pneumonia. Peribronchial thickening most likely represents  bronchitis.  Original Report Authenticated By: Ivar Drape, M.D.  08/30/12- 67 F former smoker-Self referral-COPD(pt states dx'd few years ago at [facility]) [place]: review PFT and 6MW with patient. Denies change since last here. Spiriva sample had no effect. Little cough. She had done pulmonary rehabilitation at [facility]. They let her go to make room in the program for others. a1AT normal 158 no phenotype 6 minute walk test 08/30/2012-92%, 87%, 94%, 297 m. Significant desaturation with exercise. PFT: 08/30/2012- severe obstructive airways disease with response to bronchodilator, air trapping, diffusion severely reduced. FVC 1.96/71%, FEV1 0.89/45%, FEV1/FVC 0.45. RV 134%, DLCO 47%.  11/29/12- 67 F former smoker-Self referral-COPD(pt states dx'd few years ago at [place]: chooses not to use inhalers-cant tell that they are helping; Continues to stay SOB-worsened with activity; wheezing, slight cough(non productive recently), denies any chest congestion. O2 2l/ APS for sleep- using regularly since ONOX Saw no effect limited trial Spiriva or  Symbicort  Had done Pulm Rehab. Educated exercise, progression of COPD.  05/31/13- 67 F former smoker-Self referral-COPD/ severe FOLLOWS FOR: Pt states that she feels her SOB has gotten worse since last; has to stop more often and rest. Sleeps with oxygen 2 L/ APS Has done pulmonary rehabilitation. Denies chest pain, swelling. Scant phlegm is clear.  09/29/13- 68 F former smoker-Self referral-COPD/ severe FOLLOWS FOR:  Still having sob with exertion and having to stop and rest often Sleeps with oxygen 2 L/ APS No specific spring seasonal problems. She tried a sample of Symbicort but was using it only one puff one time daily and couldn't tell that it helped. CXR 05/31/13 IMPRESSION:  1. Lung hyperexpansion and bronchitic change without acute  cardiopulmonary disease.  2. Sequela of prior granulomatous infection as above.  Electronically Signed  By: Sandi Mariscal M.D.  On: 05/31/2013 10:38   03/01/14- 12 F former smoker- followed for COPD/ chronic bronchitis severe, complicated by hx breast ca, hx shizoaffective disorder                   Son and his wife here Sleeps with oxygen 2 L/ APS increased DOE x 1 month.  Wheezing with activity.  No chest tightness/pain. Has felt more short of breath in the past month, and vague and not abrupt. Diarrhea is being treated with Imodium- started before theophylline. Unsure if theophylline helps breathing now but it seemed to at first. She has a number of inhalers at home and cannot tell they help, which is why we try theophylline in the first place.PFT in 2014 shown severe COPD with response to bronchodilator. She denied cough or wheeze but her son and daughter-in-law assured her that she coughs productively every  day. She denies chest pain, fever, edema, palpitation or blood. We discussed her previous experience with pulmonary rehabilitation. She insists she does not want to go back-did not like the regimented environment or monitored exercise.she says she  will be willing to walk more, mentioning only very vague ideas.  05/03/15-68 F former smoker- followed for COPD/ chronic bronchitis severe, complicated by hx L breast ca/ XRT, hx schizoaffective disorder                    Sleeps with oxygen 2 L/ APS FOLLOWS FOR: COPD pt has sob with exertion unchanged from last visit. Pt c/o cough with brownish mucus and wheezing. Pt denies chest tightnesss/congestion. Getting over a cold "not bad", denies need for antibiotic. No fever, purulent or blood. Mentions hx chronic loose stools.  11/06/14- 68 F former smoker- followed for COPD/ chronic bronchitis severe, complicated by hx L breast ca/ XRT, hx schizoaffective disorder                    Sleeps with oxygen 2 L/ APS Follow up to CT done on May 11, pt. states that she is doing well  She has recently seen Dr. Roxan Hockey for CT scan follow-up. He offered her 80% chance of cancer and the plan bronchoscopy this week. She indicates good understanding. Breathing is stable-denies cough or chest pain. Dyspnea on exertion with brisk walk or stairs, stable. Also pending colonoscopy/Dr. Deatra Ina. Screening tool suggested increased risk of colon cancer. Using Xopenex by nebulizer one or 2 times per day-mild help. PFT: 11/02/2014-severe obstructive airways disease. FEV1 0.90/39% with significant response to bronchodilator. CT chest 10/11/14-I reviewed images IMPRESSION: 1. Persistent irregular predominately ground-glass density in the right middle and lower lobes. This process may be minimally larger with soft tissue components anteriorly. The persistence excludes an acute inflammatory process. Although potentially scarring from previous inflammation, this finding is now more concerning for adenomatous hyperplasia or early adenocarcinoma. Thoracic surgery consultation is recommended. PET/CT should be considered for staging purposes. These recommendations are taken from: Recommendations for the Management of Subsolid  Pulmonary Nodules Detected at CT: A Statement from the [facility] 2013; 266:1, 304-317. 2. No evidence of metastatic breast cancer. 3. Stable emphysema and scattered subpleural nodularity in the right middle and lower lobes. 4. Stable probable radiation changes in the left breast. Electronically Signed  By: Richardean Sale M.D.  On: 10/11/2014 14:57  07/04/2015-70 year old female former smoker followed for COPD/chronic bronchitis severe, complicated by history left breast cancer/XRT, history schizoaffective disorder O2 2l/ sleep/APS Continues oncology follow-up for breast cancer Radiation oncology following for lung nodules. CT chest 03/12/2015 showed decreased size of right middle and right upper lobe nodules AdenoCa lung after XRT FOLLOWS FOR: Pt notes increased SOB - feels as though her COPD is worsening. Denies wheezing, chest pressure/pain. Pt c/o mucus production, chest congestion with cough, stuffy nose. Using Mucinex, DayQuil/NyQuil prn. Discuss flu vaccine.  Little cough on my discussion with her. Thinks she has an incidental cold now. Usually not much sputum. Home nebulizer helps some used only occasionally. Walk test on room air 07/04/2015-very transient dip to 88% but otherwise not in range to qualify for exercise O2. We will watch this.  ROS-see HPI Constitutional:   No-   weight loss, night sweats, fevers, chills, fatigue, lassitude. HEENT:   No-  headaches, difficulty swallowing, tooth/dental problems, sore throat,       No-  sneezing, itching, ear ache, nasal congestion, post nasal drip,  CV:  No-   chest pain, orthopnea, PND, swelling in lower extremities, anasarca, dizziness, palpitations Resp: +  shortness of breath with exertion or at rest.                productive cough,  + non-productive cough,  No- coughing up of blood.                 change in color of mucus.  No- wheezing.   Skin: No-   rash or lesions. GI:  No-   heartburn, indigestion,  abdominal pain, nausea, vomiting,  GU:  MS:  No-   joint pain or swelling.   Neuro-     nothing unusual Psych:  No- change in mood or affect. No depression or anxiety.  No memory loss.  OBJ- Physical Exam General- Alert, Oriented, Affect-appropriate, Distress- none acute, overweight Skin- rash-none, lesions- none, excoriation- none Lymphadenopathy- none Head- atraumatic            Eyes- Gross vision intact, PERRLA, conjunctivae and secretions clear            Ears- Hearing, canals-normal            Nose- Clear, no-Septal dev, mucus, polyps, erosion, perforation             Throat- Mallampati II , mucosa clear , drainage- none, tonsils- atrophic,  Neck- flexible , trachea midline, no stridor , thyroid nl, carotid no bruit Chest - symmetrical excursion , unlabored           Heart/CV- RRR , no murmur , no gallop  , no rub, nl s1 s2                           - JVD- none , edema- none, stasis changes- none, varices- none           Lung- +diminished, clear, unlabored, wheeze- none, No- cough , dullness-none, rub- none           Chest wall-  Abd-  Br/ Gen/ Rectal- Not done, not indicated Extrem- cyanosis- none, clubbing, none, atrophy- none, strength- nl Neuro- grossly intact to observation

## 2015-07-04 NOTE — Assessment & Plan Note (Signed)
Adenocarcinoma followed by oncology after XRT 2016

## 2015-07-21 ENCOUNTER — Other Ambulatory Visit: Payer: Self-pay | Admitting: Internal Medicine

## 2015-07-21 NOTE — Telephone Encounter (Signed)
Due for CPE late June- may refill until then- please call pt and set up CPE late June

## 2015-07-25 DIAGNOSIS — J449 Chronic obstructive pulmonary disease, unspecified: Secondary | ICD-10-CM | POA: Diagnosis not present

## 2015-07-30 NOTE — Progress Notes (Signed)
.  Skin status:Normal Appetite:Good Pain:None Energy level:Low at times When is next chemo scheduled:Never took chemo Labs front of chart: Respiratory status: Coughing at times  Up loose secretions ,SOB,has COPD, O2 sat 96& Swallowing Problems/Pain:None Weight: Wt Readings from Last 3 Encounters:  08/02/15 167 lb 4.8 oz (75.887 kg)  07/04/15 165 lb (74.844 kg)  06/28/15 163 lb 11.2 oz (74.254 kg)  BP 121/66 mmHg  Pulse 79  Temp(Src) 97.8 F (36.6 C) (Oral)  Resp 18  Ht '5\' 4"'$  (1.626 m)  Wt 167 lb 4.8 oz (75.887 kg)  BMI 28.70 kg/m2  SpO2 96%  Labs front of chart:No recent lab work done

## 2015-07-31 ENCOUNTER — Ambulatory Visit (HOSPITAL_COMMUNITY)
Admission: RE | Admit: 2015-07-31 | Discharge: 2015-07-31 | Disposition: A | Discharge status: Home / Self Care | Payer: PPO | Source: Ambulatory Visit | Attending: Radiation Oncology | Admitting: Radiation Oncology

## 2015-07-31 DIAGNOSIS — C3491 Malignant neoplasm of unspecified part of right bronchus or lung: Secondary | ICD-10-CM | POA: Diagnosis not present

## 2015-07-31 DIAGNOSIS — R918 Other nonspecific abnormal finding of lung field: Secondary | ICD-10-CM | POA: Insufficient documentation

## 2015-07-31 DIAGNOSIS — C342 Malignant neoplasm of middle lobe, bronchus or lung: Secondary | ICD-10-CM | POA: Diagnosis not present

## 2015-07-31 DIAGNOSIS — I2510 Atherosclerotic heart disease of native coronary artery without angina pectoris: Secondary | ICD-10-CM | POA: Insufficient documentation

## 2015-07-31 DIAGNOSIS — I7090 Unspecified atherosclerosis: Secondary | ICD-10-CM | POA: Diagnosis not present

## 2015-08-02 ENCOUNTER — Telehealth: Payer: Self-pay | Admitting: *Deleted

## 2015-08-02 ENCOUNTER — Ambulatory Visit
Admission: RE | Admit: 2015-08-02 | Discharge: 2015-08-02 | Disposition: A | Discharge status: Home / Self Care | Payer: PPO | Source: Ambulatory Visit | Attending: Radiation Oncology | Admitting: Radiation Oncology

## 2015-08-02 ENCOUNTER — Encounter: Payer: Self-pay | Admitting: Radiation Oncology

## 2015-08-02 VITALS — BP 121/66 | HR 79 | Temp 97.8°F | Resp 18 | Ht 64.0 in | Wt 167.3 lb

## 2015-08-02 DIAGNOSIS — C50312 Malignant neoplasm of lower-inner quadrant of left female breast: Secondary | ICD-10-CM

## 2015-08-02 NOTE — Progress Notes (Addendum)
   Department of Radiation Oncology  Phone:  [phone] Fax:        [phone]   Name: Summer Williams MRN: [id number]  DOB: 05/02/1946  Date: 08/02/2015  Follow Up Visit Note  Diagnosis: Cancer of lower-inner quadrant of left female breast   Staging form: Breast, AJCC 7th Edition     Clinical: Stage IA (T1c, N0, cM0) - Signed by Rulon Eisenmenger, MD on 01/18/2014     Pathologic: No stage assigned - Unsigned 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   Staging form: Lung, AJCC 7th Edition     Clinical stage from 11/22/2014: T2, N0, M0 - Signed by Thea Silversmith, MD on 11/22/2014  Summary and Interval since last radiation: 7.5 months. The patient's radiation treatment dates extended from  12/13/2014-01/19/2015. The site and dose used included Right upper/middle lobe of lung, 2.6 Gy/frac for total of 70.2 Gy. The beams and energy used included 3D-Conformal, 6 and 10 MV photons.   Interval History: Summer Williams presents today for routine followup appointment with radiation oncology. She mentions that her appetite is good and her energy level is low at times. She denies pain. She mentions that she coughs up loose secretions at times and has some shortness of breath.  Physical Exam: The patient is alert and oriented x3. There is no significant changes to the status of the paients overall health to be noted at this time. No skin changes. Normal breathing.  Filed Vitals:   08/02/15 0835  BP: 121/66  Pulse: 79  Temp: 97.8 F (36.6 C)  TempSrc: Oral  Resp: 18  Height: '5\' 4"'$  (1.626 m)  Weight: 167 lb 4.8 oz (75.887 kg)  SpO2: 96%    IMPRESSION: Summer Williams is a 70 y.o. female presenting to clinic in regards to her cancer of the left female breast. She is recovering from radiation therapy and appropriately managing reported symptoms. We reviewed her CT scan from 07/31/2015. The patient understands that she can access her appointments and medical records via [place].  PLAN:  She will have a CT  scan in 6 months and I will follow up with her after the scan.  She has been advised of the importance of completing annual mammograms in regards to the best management of her disease. She has an appointment with Dr. Lindi Adie 08/28/2015.   All vocalized questions and concerns have been addressed. If the patient develops any further questions or concerns in regards to her treatment and recovery, she has been encouraged to contact Dr. Pablo Ledger, MD.  ------------------------------------------------  Thea Silversmith, MD    This document serves as a record of services personally performed by Thea Silversmith, MD. It was created on her behalf by  Lendon Collar, a trained medical scribe. The creation of this record is based on the scribe's personal observations and the provider's statements to them. This document has been checked and approved by the attending provider.

## 2015-08-02 NOTE — Telephone Encounter (Signed)
CALLED PATIENT TO INFORM OF CT ON 02-06-16 AND HER FU WITH DR. Pablo Ledger ON 02-07-16, SPOKE WITH PATIENT AND SHE IS AWARE OF THESE APPTS.

## 2015-08-14 DIAGNOSIS — L820 Inflamed seborrheic keratosis: Secondary | ICD-10-CM | POA: Diagnosis not present

## 2015-08-14 DIAGNOSIS — T1490 Injury, unspecified: Secondary | ICD-10-CM | POA: Diagnosis not present

## 2015-08-14 DIAGNOSIS — L570 Actinic keratosis: Secondary | ICD-10-CM | POA: Diagnosis not present

## 2015-08-14 DIAGNOSIS — X32XXXD Exposure to sunlight, subsequent encounter: Secondary | ICD-10-CM | POA: Diagnosis not present

## 2015-08-22 DIAGNOSIS — J449 Chronic obstructive pulmonary disease, unspecified: Secondary | ICD-10-CM | POA: Diagnosis not present

## 2015-08-28 ENCOUNTER — Ambulatory Visit (HOSPITAL_BASED_OUTPATIENT_CLINIC_OR_DEPARTMENT_OTHER): Payer: PPO | Admitting: Hematology and Oncology

## 2015-08-28 ENCOUNTER — Encounter: Payer: Self-pay | Admitting: Hematology and Oncology

## 2015-08-28 VITALS — BP 126/78 | HR 84 | Temp 97.9°F | Resp 18 | Ht 64.0 in | Wt 167.8 lb

## 2015-08-28 DIAGNOSIS — C50312 Malignant neoplasm of lower-inner quadrant of left female breast: Secondary | ICD-10-CM

## 2015-08-28 DIAGNOSIS — C342 Malignant neoplasm of middle lobe, bronchus or lung: Secondary | ICD-10-CM

## 2015-08-28 NOTE — Assessment & Plan Note (Signed)
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 based on CT scan done on 10/11/2014, no mediastinal or hilar lymphadenopathy by CT findings, T2 N0 M0 stage IB Status post definitive radiation therapy completed 01/19/2015 Patient is not a candidate for chemotherapy because of her performance status  CT chest 03/12/2015 Right middle and upper lobe spiculated groundglass mass measures 2.7 x 2.23.3 cm previously was 3.4 x 2.44.2 cm, 3 mm right middle lobe nodule, chronic left lower lobe nodule 3 mm  CT chest 07/31/2015:similar to slight decrease in the sub-solitary pulmonary nodule (2.3 x 1.7 cm was previously 2.7 x 2.2 cm) in the right upper and middle lobes, treatment-related changes, 3 mm right middle lobe pulmonary nodule unchanged, clustered nodules right lower lobe unchanged, groundglass density right lower lobe is new 6 mm left lower lobe subpleural pulmonary nodule, 3 mm left lower lobe pulmonary nodule similar.  Plan: Continue with surveillance for lung cancer.

## 2015-08-28 NOTE — Assessment & Plan Note (Signed)
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 Bone density shows normal bones. Another bone density will be done October 2017.  Breast cancer surveillance: Mammograms 12/15/2014 were normal Return to clinic in 6 months for follow-up

## 2015-08-28 NOTE — Progress Notes (Signed)
Patient Care Team: Elby Showers, MD as PCP - General (Internal Medicine) Nicholas Lose, MD as Consulting Physician (Hematology and Oncology) Erroll Luna, MD as Consulting Physician (General Surgery) Thea Silversmith, MD as Consulting Physician (Radiation Oncology)  DIAGNOSIS: Cancer of lower-inner quadrant of left female breast [facility])   Staging form: Breast, AJCC 7th Edition     Clinical: Stage IA (T1c, N0, cM0) - Signed by Rulon Eisenmenger, MD on 01/18/2014     Pathologic: No stage assigned - Unsigned 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 [facility])   Staging form: Lung, AJCC 7th Edition     Clinical stage from 11/22/2014: T2, N0, M0 - Signed by Thea Silversmith, MD on 11/22/2014   SUMMARY OF ONCOLOGIC HISTORY:   Cancer of lower-inner quadrant of left female breast ([place])   01/05/2014 Initial Diagnosis Ultrasound-guided core needle biopsy: Cancer of lower-inner quadrant of left female breast, ER 100%, PR 90%, Ki-67: 30%, HER-2 negative (Ratio 1.02), grade 2   01/13/2014 Breast MRI 1.6 cm left breast abnormality, no abnormal lymph nodes.   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 ([place])   11/10/2014 Initial Diagnosis Right lung biopsy adenocarcinoma TTF-1 strong diffuse expression, CK 7 strong diffuse expression GCDFP negative; estrogen receptor weak   12/13/2014 - 01/19/2015 Radiation Therapy Right upper/middle lobe of lung, 2.6 Gy/frac for total of 70.2 Gy (definitive radiation)   03/12/2015 Imaging Right middle and upper lobe spiculated groundglass mass measures 2.7 x 2.23.3 cm previously was 3.4 x 2.44.2 cm, 3 mm right middle lobe nodule, chronic left lower lobe nodule 3 mm   07/31/2015 Imaging CT chest :similar to slight decrease in the sub-solitary pulmonary nodule (2.3 x 1.7 cm was previously 2.7 x 2.2 cm) in the right upper and middle lobes, treatment-related changes, unchanged pulmonary nodules in right and left lungs    CHIEF COMPLIANT: follow-up of breast and lung cancers  INTERVAL HISTORY: Summer Williams is a 70 year old with above-mentioned history of left breast cancer currently on Arimidex therapy and tolerating it very well. She also has a history of right lung cancer who underwent definitive therapy with radiation. She is here for a surveillance checkup. She complains that she had recently gone bowling and may have pulled a muscle underneath the right arm and it's causing her a lot of pain and discomfort. She denies any cough or expectoration. She has CT scan in February 2017 which showed stable findings in the chest but decrease in the scar tissue.  REVIEW OF SYSTEMS:   Constitutional: Denies fevers, chills or abnormal weight loss Eyes: Denies blurriness of vision Ears, nose, mouth, throat, and face: Denies mucositis or sore throat Respiratory: Denies cough, dyspnea or wheezes Cardiovascular: Denies palpitation, chest discomfort Gastrointestinal:  Denies nausea, heartburn or change in bowel habits Skin: Denies abnormal skin rashes Lymphatics: Denies new lymphadenopathy or easy bruising Neurological:Denies numbness, tingling or new weaknesses Behavioral/Psych: Mood is stable, no new changes  Extremities: pain underneath the right arm after  recent bowling Breast:  denies any pain or lumps or nodules in either breasts All other systems were reviewed with the patient and are negative.  I have reviewed the past medical history, past surgical history,  social history and family history with the patient and they are unchanged from previous note.  ALLERGIES:  has No Known Allergies.  MEDICATIONS:  Current Outpatient Prescriptions  Medication Sig Dispense Refill  . anastrozole (ARIMIDEX) 1 MG tablet TAKE 1 TABLET (1 MG TOTAL) BY MOUTH DAILY. 90 tablet 3  . aspirin 325 MG tablet Take 325 mg by mouth daily.    Marland Kitchen atorvastatin (LIPITOR) 40 MG tablet TAKE 1 TABLET BY MOUTH EVERY DAY 30 tablet 4  . Cholecalciferol (VITAMIN D) 2000 UNITS CAPS Take 1 capsule by mouth daily.     . cholestyramine (QUESTRAN) 4 G packet Take 1 packet (4 g total) by mouth 2 (two) times daily. 180 each 3  . citalopram (CELEXA) 20 MG tablet Take 20 mg by mouth daily. Reported on 06/28/2015    . Lactobacillus (ACIDOPHILUS PO) Take 1 tablet by mouth daily.     Marland Kitchen levalbuterol (XOPENEX) 0.63 MG/3ML nebulizer solution Take 3 mLs (0.63 mg total) by nebulization every 6 (six) hours as needed for wheezing or shortness of breath. 120 mL 12  . magnesium oxide (MAG-OX) 400 MG tablet Take 400 mg by mouth daily.    . Multiple Vitamins-Minerals (MULTIVITAMIN GUMMIES ADULT PO) Take 2 tablets by mouth daily.     Marland Kitchen OLANZapine (ZYPREXA) 7.5 MG tablet Take 7.5 mg by mouth daily.  3  . Omega-3 Fatty Acids (FISH OIL) 1200 MG CAPS Take 1 capsule by mouth daily.    . Probiotic Product (PROBIOTIC DAILY PO) Take 2 tablets by mouth daily. Probiotic gummy    . saccharomyces boulardii (FLORASTOR) 250 MG capsule Take 250 mg by mouth 2 (two) times daily. Reported on 08/02/2015    . Umeclidinium-Vilanterol (ANORO ELLIPTA) 62.5-25 MCG/INH AEPB Inhale 1 puff, once daily 60 each 12   No current facility-administered medications for this visit.    PHYSICAL EXAMINATION: ECOG PERFORMANCE STATUS: 1 - Symptomatic but completely ambulatory  Filed Vitals:   08/28/15 1041  BP: 126/78  Pulse: 84  Temp: 97.9 F (36.6 C)  Resp: 18   Filed Weights   08/28/15 1041  Weight: 167 lb 12.8 oz (76.114 kg)     GENERAL:alert, no distress and comfortable SKIN: skin color, texture, turgor are normal, no rashes or significant lesions EYES: normal, Conjunctiva are pink and non-injected, sclera clear OROPHARYNX:no exudate, no erythema and lips, buccal mucosa, and tongue normal  NECK: supple, thyroid normal size, non-tender, without nodularity LYMPH:  no palpable lymphadenopathy in the cervical, axillary or inguinal LUNGS: clear to auscultation and percussion with normal breathing effort HEART: regular rate & rhythm and no murmurs and no lower extremity edema ABDOMEN:abdomen soft, non-tender and normal bowel sounds MUSCULOSKELETAL:no cyanosis of digits and no clubbing  NEURO: alert & oriented x 3 with fluent speech, no focal motor/sensory deficits EXTREMITIES: severe pain in the right axilla along with tenderness in the pectoral muscle related to her recent bowling injury. BREAST: No palpable masses or nodules in either right or left breasts. No palpable axillary supraclavicular or infraclavicular adenopathy no breast tenderness or nipple discharge. (exam performed in the presence of a chaperone)  LABORATORY DATA:  I have reviewed the data as listed   Chemistry      Component Value Date/Time   NA 141 11/14/2014 1056   NA  142 01/18/2014 1233   K 4.1 11/14/2014 1056   K 4.2 01/18/2014 1233   CL 104 11/14/2014 1056   CO2 27 11/14/2014 1056   CO2 26 01/18/2014 1233   BUN 9 11/14/2014 1056   BUN 9.7 01/18/2014 1233   CREATININE 0.75 11/14/2014 1056   CREATININE 0.75 11/03/2014 1414   CREATININE 0.8 01/18/2014 1233      Component Value Date/Time   CALCIUM 9.4 11/14/2014 1056   CALCIUM 9.9 01/18/2014 1233   ALKPHOS 101 11/14/2014 1056   ALKPHOS 115 01/18/2014 1233   AST 21 11/14/2014 1056   AST 22 01/18/2014 1233   ALT 19 11/14/2014 1056   ALT 12 01/18/2014 1233   BILITOT 1.1 11/14/2014 1056   BILITOT 0.81 01/18/2014 1233       Lab Results  Component Value Date   WBC 5.7 11/14/2014    HGB 13.7 11/14/2014   HCT 41.0 11/14/2014   MCV 91.5 11/14/2014   PLT 112* 11/14/2014   NEUTROABS 2.5 11/14/2014     ASSESSMENT & PLAN:  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 based on CT scan done on 10/11/2014, no mediastinal or hilar lymphadenopathy by CT findings, T2 N0 M0 stage IB Status post definitive radiation therapy completed 01/19/2015 Patient is not a candidate for chemotherapy because of her performance status  CT chest 03/12/2015 Right middle and upper lobe spiculated groundglass mass measures 2.7 x 2.23.3 cm previously was 3.4 x 2.44.2 cm, 3 mm right middle lobe nodule, chronic left lower lobe nodule 3 mm  CT chest 07/31/2015:similar to slight decrease in the sub-solitary pulmonary nodule (2.3 x 1.7 cm was previously 2.7 x 2.2 cm) in the right upper and middle lobes, treatment-related changes, 3 mm right middle lobe pulmonary nodule unchanged, clustered nodules right lower lobe unchanged, groundglass density right lower lobe is new 6 mm left lower lobe subpleural pulmonary nodule, 3 mm left lower lobe pulmonary nodule similar.  Plan: Continue with surveillance for lung cancer as directed by Dr. Pablo Ledger.   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 Bone density shows normal bones. Another bone density will be done October 2017.  Breast cancer surveillance: Mammograms 12/15/2014 were normal Breast exam 08/28/2015: No palpable lumps or nodules. Tenderness underneath the right axilla related to muscular sprain/injury  Pain in the right axilla with limitation of range of  motion: I encouraged her to see orthopedic specialist for further evaluation.  Return to clinic in 6 months for follow-up  No orders of the defined types were placed in this encounter.   The patient has a good understanding of the overall plan. she agrees with it. she will call with any problems that may develop before the next visit here.   Rulon Eisenmenger, MD 08/28/2015

## 2015-08-28 NOTE — Progress Notes (Signed)
Unable to get in to exam room prior to MD.  No assessment performed.  

## 2015-08-29 ENCOUNTER — Telehealth: Payer: Self-pay | Admitting: Hematology and Oncology

## 2015-08-29 NOTE — Telephone Encounter (Signed)
lvm for pt regarding to Sept appt.... °

## 2015-09-22 DIAGNOSIS — J449 Chronic obstructive pulmonary disease, unspecified: Secondary | ICD-10-CM | POA: Diagnosis not present

## 2015-10-22 DIAGNOSIS — J449 Chronic obstructive pulmonary disease, unspecified: Secondary | ICD-10-CM | POA: Diagnosis not present

## 2015-11-01 ENCOUNTER — Ambulatory Visit (INDEPENDENT_AMBULATORY_CARE_PROVIDER_SITE_OTHER): Payer: PPO | Admitting: Internal Medicine

## 2015-11-01 ENCOUNTER — Encounter: Payer: Self-pay | Admitting: Internal Medicine

## 2015-11-01 VITALS — BP 124/80 | HR 85 | Ht 64.0 in | Wt 169.2 lb

## 2015-11-01 DIAGNOSIS — C342 Malignant neoplasm of middle lobe, bronchus or lung: Secondary | ICD-10-CM | POA: Diagnosis not present

## 2015-11-01 DIAGNOSIS — J449 Chronic obstructive pulmonary disease, unspecified: Secondary | ICD-10-CM | POA: Diagnosis not present

## 2015-11-01 NOTE — Patient Instructions (Signed)
We can continue current meds  Please call if we can help 

## 2015-11-01 NOTE — Progress Notes (Signed)
Patient ID: Summer Williams, female   DOB: Aug 02, 1945, 70 y.o.   MRN: [id number]     03/01/14- 52 F former smoker- followed for COPD/ chronic bronchitis severe, complicated by hx breast ca, hx shizoaffective disorder                   Son and his wife here Sleeps with oxygen 2 L/ APS increased DOE x 1 month.  Wheezing with activity.  No chest tightness/pain. Has felt more short of breath in the past month, and vague and not abrupt. Diarrhea is being treated with Imodium- started before theophylline. Unsure if theophylline helps breathing now but it seemed to at first. She has a number of inhalers at home and cannot tell they help, which is why we try theophylline in the first place.PFT in 2014 shown severe COPD with response to bronchodilator. She denied cough or wheeze but her son and daughter-in-law assured her that she coughs productively every day. She denies chest pain, fever, edema, palpitation or blood. We discussed her previous experience with pulmonary rehabilitation. She insists she does not want to go back-did not like the regimented environment or monitored exercise.she says she will be willing to walk more, mentioning only very vague ideas.  05/03/15-68 F former smoker- followed for COPD/ chronic bronchitis severe, complicated by hx L breast ca/ XRT, hx schizoaffective disorder                    Sleeps with oxygen 2 L/ APS FOLLOWS FOR: COPD pt has sob with exertion unchanged from last visit. Pt c/o cough with brownish mucus and wheezing. Pt denies chest tightnesss/congestion. Getting over a cold "not bad", denies need for antibiotic. No fever, purulent or blood. Mentions hx chronic loose stools.  11/06/14- 68 F former smoker- followed for COPD/ chronic bronchitis severe, complicated by hx L breast ca/ XRT, hx schizoaffective disorder                    Sleeps with oxygen 2 L/ APS Follow up to CT done on May 11, pt. states that she is doing well  She has recently seen Dr. Roxan Hockey for  CT scan follow-up. He offered her 80% chance of cancer and the plan bronchoscopy this week. She indicates good understanding. Breathing is stable-denies cough or chest pain. Dyspnea on exertion with brisk walk or stairs, stable. Also pending colonoscopy/Dr. Deatra Ina. Screening tool suggested increased risk of colon cancer. Using Xopenex by nebulizer one or 2 times per day-mild help. PFT: 11/02/2014-severe obstructive airways disease. FEV1 0.90/39% with significant response to bronchodilator. CT chest 10/11/14-I reviewed images IMPRESSION: 1. Persistent irregular predominately ground-glass density in the right middle and lower lobes. This process may be minimally larger with soft tissue components anteriorly. The persistence excludes an acute inflammatory process. Although potentially scarring from previous inflammation, this finding is now more concerning for adenomatous hyperplasia or early adenocarcinoma. Thoracic surgery consultation is recommended. PET/CT should be considered for staging purposes. These recommendations are taken from: Recommendations for the Management of Subsolid Pulmonary Nodules Detected at CT: A Statement from the [facility] 2013; 266:1, 304-317. 2. No evidence of metastatic breast cancer. 3. Stable emphysema and scattered subpleural nodularity in the right middle and lower lobes. 4. Stable probable radiation changes in the left breast. Electronically Signed  By: Richardean Sale M.D.  On: 10/11/2014 14:57  07/04/2015-70 year old female former smoker followed for COPD/chronic bronchitis severe, complicated by history left breast cancer/XRT, history schizoaffective disorder O2  2l/ sleep/APS Continues oncology follow-up for breast cancer Radiation oncology following for lung nodules. CT chest 03/12/2015 showed decreased size of right middle and right upper lobe nodules AdenoCa lung after XRT FOLLOWS FOR: Pt notes increased SOB - feels as though her  COPD is worsening. Denies wheezing, chest pressure/pain. Pt c/o mucus production, chest congestion with cough, stuffy nose. Using Mucinex, DayQuil/NyQuil prn. Discuss flu vaccine.  Little cough on my discussion with her. Thinks she has an incidental cold now. Usually not much sputum. Home nebulizer helps some used only occasionally. Walk test on room air 07/04/2015-very transient dip to 88% but otherwise not in range to qualify for exercise O2. We will watch this.  11/01/2015-70 year old female former smoker followed for COPD/chronic bronchitis severe, complicated by history left breast cancer/XRT, history schizoaffective disorder, adenocarcinoma lung being followed by radiation oncology O2 2 L sleep/APS Patient seen in the office today and instructed on use of Anoro.  Patient expressed understanding and demonstrated technique. FOLLOWS FOR: Pt states her breathing is worse as far as wheezing and SOB. Pt is unsure how to use Anoro-have shown technique again today. She doesn't notice much benefit from nebulizer machine but her daughter-in-law tells her it does help her. Little phlegm or cough. CT chest 07/31/2015-followed by radiation oncology with follow-up CT chest ordered 02/2016 IMPRESSION: 1. Similar to slight decrease in size of a sub solid pulmonary nodule involving the right upper and right middle lobes. Areas of apparent increased soft tissue density within could be treatment related. Cannot exclude mixed response to therapy (decreased lesion size with increased soft tissue component). 2. No evidence of thoracic nodal metastasis. 3. Primarily similar pulmonary nodules. A new subtle ground-glass nodule in the superior segment right lower lobe could be radiation induced but warrants followup attention. 4. Atherosclerosis, including within the coronary arteries. 5. 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 Electronically Signed  By: Abigail Miyamoto M.D.  On: 07/31/2015  14:31  ROS-see HPI Constitutional:   No-   weight loss, night sweats, fevers, chills, fatigue, lassitude. HEENT:   No-  headaches, difficulty swallowing, tooth/dental problems, sore throat,       No-  sneezing, itching, ear ache, nasal congestion, post nasal drip,  CV:  No-   chest pain, orthopnea, PND, swelling in lower extremities, anasarca, dizziness, palpitations Resp: +  shortness of breath with exertion or at rest.                productive cough,  + non-productive cough,  No- coughing up of blood.                 change in color of mucus.  No- wheezing.   Skin: No-   rash or lesions. GI:  No-   heartburn, indigestion, abdominal pain, nausea, vomiting,  GU:  MS:  No-   joint pain or swelling.   Neuro-     nothing unusual Psych:  No- change in mood or affect. No depression or anxiety.  No memory loss.  OBJ- Physical Exam General- Alert, Oriented, Affect-appropriate, Distress- none acute, overweight Skin- rash-none, lesions- none, excoriation- none Lymphadenopathy- none Head- atraumatic            Eyes- Gross vision intact, PERRLA, conjunctivae and secretions clear            Ears- Hearing, canals-normal            Nose- Clear, no-Septal dev, mucus, polyps, erosion, perforation  Throat- Mallampati II , mucosa clear , drainage- none, tonsils- atrophic,  Neck- flexible , trachea midline, no stridor , thyroid nl, carotid no bruit Chest - symmetrical excursion , unlabored           Heart/CV- RRR , no murmur , no gallop  , no rub, nl s1 s2                           - JVD- none , edema- none, stasis changes- none, varices- none           Lung- +diminished, clear, unlabored, wheeze- none, No- cough , dullness-none, rub- none           Chest wall-  Abd-  Br/ Gen/ Rectal- Not done, not indicated Extrem- cyanosis- none, clubbing, none, atrophy- none, strength- nl Neuro- grossly intact to observation

## 2015-11-04 NOTE — Assessment & Plan Note (Signed)
Adenocarcinoma right lung by needle biopsy 2016, now treated by radiation oncology and followed by oncology

## 2015-11-04 NOTE — Assessment & Plan Note (Addendum)
Slow progression of disease, natural course without acute exacerbation. Meds okay.

## 2015-11-15 ENCOUNTER — Other Ambulatory Visit: Payer: Self-pay | Admitting: Gastroenterology

## 2015-11-16 ENCOUNTER — Other Ambulatory Visit: Payer: Self-pay | Admitting: Gastroenterology

## 2015-11-16 MED ORDER — CHOLESTYRAMINE 4 G PO PACK
4.0000 g | PACK | Freq: Two times a day (BID) | ORAL | Status: DC
Start: 2015-11-16 — End: 2015-11-19

## 2015-11-16 NOTE — Telephone Encounter (Signed)
Refill sent to pharmacy.   

## 2015-11-19 ENCOUNTER — Ambulatory Visit (INDEPENDENT_AMBULATORY_CARE_PROVIDER_SITE_OTHER): Payer: PPO | Admitting: Gastroenterology

## 2015-11-19 ENCOUNTER — Encounter: Payer: Self-pay | Admitting: *Deleted

## 2015-11-19 VITALS — BP 132/82 | HR 84 | Ht 64.0 in | Wt 168.1 lb

## 2015-11-19 DIAGNOSIS — K529 Noninfective gastroenteritis and colitis, unspecified: Secondary | ICD-10-CM

## 2015-11-19 MED ORDER — CHOLESTYRAMINE 4 G PO PACK
4.0000 g | PACK | Freq: Two times a day (BID) | ORAL | Status: DC
Start: 2015-11-19 — End: 2015-11-19

## 2015-11-19 MED ORDER — CHOLESTYRAMINE 4 G PO PACK: 4.0000 g | PACK | Freq: Two times a day (BID) | ORAL | Status: DC

## 2015-11-19 NOTE — Patient Instructions (Addendum)
If you are age 71 or older, your body mass index should be between 23-30. Your Body mass index is 28.84 kg/(m^2). If this is out of the aforementioned range listed, please consider follow up with your Primary Care Provider.  If you are age 43 or younger, your body mass index should be between 19-25. Your Body mass index is 28.84 kg/(m^2). If this is out of the aformentioned range listed, please consider follow up with your Primary Care Provider.   Follow up in one year  We have sent the following medications to your pharmacy for you to pick up at your convenience: Summer Williams

## 2015-11-19 NOTE — Progress Notes (Signed)
HPI :  70 y/o female who was seen about a year ago for diarrhea by Dr. Deatra Ina, here for follow up. She has a history of both breast and lung cancer s/p XRT therapy, she was not a candidate for chemotherapy given poor functional status. She has COPD and wears oxygen at night and has chronic dyspnea.   She had a positive Cologuard last year as part of her colon cancer screening, which led to a colonoscopy on 11/08/14 showing diverticulosis and hemorrhoids, no polyps noted. No obvious inflammatory changes but biopsies for microscopic colitis were not taken.   She has been on cholestyramine for the past year for her diarrhea. She takes it twice daily and it works very well for her and she feels much better on this regimen. She has 1-2 BMs per day when she takes it and has no complaints. If she does not take it she will have increased stool frequency. No abdominal pains. No weight loss. No blood in the stools. Otherwise feeling well without complaints.     Past Medical History  Diagnosis Date  . History of alcohol abuse     last 1980  . Hyperlipidemia   . Vitamin D deficiency   . Depression   . Wears glasses   . Requires supplemental oxygen     at night USES 2 LITERS   . Radiation 03/23/14-04/19/14    Left breast  . Oxygen deficiency   . COPD (chronic obstructive pulmonary disease) ([place])   . Skin cancer     Squamous  . Breast cancer ([place]) 01/05/14    left breast  . Shortness of breath      WITH ACTIVITY  . Radiation 12/13/14-01/19/15    right upper/middle lobe of lung 70.2 Gy  . Diverticulosis   . Internal hemorrhoids   . Lung cancer [facility])      Past Surgical History  Procedure Laterality Date  . Cholecystectomy  2003  . Dental surgery      implants  . Breast surgery  02/09/2014    Left luimpectomy  . Eye surgery Bilateral 2005    Cataract OD  . Video bronchoscopy with endobronchial navigation N/A 11/10/2014    Procedure: VIDEO BRONCHOSCOPY WITH ENDOBRONCHIAL NAVIGATION;   Surgeon: Melrose Nakayama, MD;  Location: [facility];  Service: Thoracic;  Laterality: N/A;  . Colonoscopy with propofol N/A 11/08/2014    Procedure: COLONOSCOPY WITH PROPOFOL;  Surgeon: Inda Castle, MD;  Location: [facility];  Service: Endoscopy;  Laterality: N/A;   Family History  Problem Relation Age of Onset  . Heart disease Father   . Lung cancer Maternal Grandfather   . Leukemia Paternal Grandfather   . Ovarian cancer Maternal Aunt   . Breast cancer Maternal Aunt   . Colon cancer Neg Hx   . Colon polyps Neg Hx   . Esophageal cancer Neg Hx   . Kidney disease Neg Hx   . Gallbladder disease Neg Hx   . Stomach cancer Neg Hx   . Rectal cancer Neg Hx   . Cholelithiasis Mother    Social History  Substance Use Topics  . Smoking status: Former Smoker -- 1.00 packs/day for 50 years    Types: Cigarettes    Quit date: 08/31/2005  . Smokeless tobacco: Never Used  . Alcohol Use: No     Comment: hx abuse-last 1980   Current Outpatient Prescriptions  Medication Sig Dispense Refill  . anastrozole (ARIMIDEX) 1 MG tablet TAKE 1 TABLET (1 MG TOTAL)  BY MOUTH DAILY. 90 tablet 3  . aspirin 325 MG tablet Take 325 mg by mouth daily.    Marland Kitchen atorvastatin (LIPITOR) 40 MG tablet TAKE 1 TABLET BY MOUTH EVERY DAY 30 tablet 4  . Cholecalciferol (VITAMIN D) 2000 UNITS CAPS Take 1 capsule by mouth daily.     . cholestyramine (QUESTRAN) 4 g packet Take 1 packet (4 g total) by mouth 2 (two) times daily. 180 each 3  . citalopram (CELEXA) 20 MG tablet Take 20 mg by mouth daily. Reported on 06/28/2015    . Lactobacillus (ACIDOPHILUS PO) Take 1 tablet by mouth daily.     Marland Kitchen levalbuterol (XOPENEX) 0.63 MG/3ML nebulizer solution Take 3 mLs (0.63 mg total) by nebulization every 6 (six) hours as needed for wheezing or shortness of breath. 120 mL 12  . magnesium oxide (MAG-OX) 400 MG tablet Take 400 mg by mouth daily.    . Multiple Vitamins-Minerals (MULTIVITAMIN GUMMIES ADULT PO) Take 2 tablets by mouth daily.       Marland Kitchen OLANZapine (ZYPREXA) 7.5 MG tablet Take 7.5 mg by mouth daily.  3  . Omega-3 Fatty Acids (FISH OIL) 1200 MG CAPS Take 1 capsule by mouth daily.    . Probiotic Product (PROBIOTIC DAILY PO) Take 2 tablets by mouth daily. Probiotic gummy    . saccharomyces boulardii (FLORASTOR) 250 MG capsule Take 250 mg by mouth 2 (two) times daily. Reported on 08/02/2015    . Umeclidinium-Vilanterol (ANORO ELLIPTA) 62.5-25 MCG/INH AEPB Inhale 1 puff, once daily 60 each 12   No current facility-administered medications for this visit.   No Known Allergies   Review of Systems: All systems reviewed and negative except where noted in HPI.   Lab Results  Component Value Date   WBC 5.7 11/14/2014   HGB 13.7 11/14/2014   HCT 41.0 11/14/2014   MCV 91.5 11/14/2014   PLT 112* 11/14/2014    Lab Results  Component Value Date   ALT 19 11/14/2014   AST 21 11/14/2014   ALKPHOS 101 11/14/2014   BILITOT 1.1 11/14/2014    Lab Results  Component Value Date   CREATININE 0.75 11/14/2014   BUN 9 11/14/2014   NA 141 11/14/2014   K 4.1 11/14/2014   CL 104 11/14/2014   CO2 27 11/14/2014    Lab Results  Component Value Date   INR 1.06 11/03/2014      Physical Exam: BP 132/82 mmHg  Pulse 84  Ht '5\' 4"'$  (1.626 m)  Wt 168 lb 2 oz (76.261 kg)  BMI 28.84 kg/m2 Constitutional: Pleasant,well-developed, female in no acute distress. HEENT: Normocephalic and atraumatic. Conjunctivae are normal. No scleral icterus. Neck supple.  Cardiovascular: Normal rate, regular rhythm.  Pulmonary/chest: Effort normal and breath sounds normal. No wheezing, rales or rhonchi. Abdominal: Soft, nondistended, nontender. Bowel sounds active throughout. There are no masses palpable. No hepatomegaly. Extremities: no edema Lymphadenopathy: No cervical adenopathy noted. Neurological: Alert and oriented to person place and time. Skin: Skin is warm and dry. No rashes noted. Psychiatric: Normal mood and affect. Behavior is  normal.   ASSESSMENT AND PLAN: 70 y/o female with history of breast and lung cancer s/p XRT therapy, with COPD on supplemental oxygen, here for follow up for chronic diarrhea. Prior infectious workup was negative and negative fecal lactoferrin, with negative celiac lab testing. 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 In  the past year she had a Cologuard for screening purposes which was positive, and ultimately led to a colonoscopy, which in turn was negative. Suspect Cologuard was false positive which is rare but can occur (92% specific). Moving forward she is pleased how well cholestyramine has worked for her so will continue it for now. Given her other medical problems and age, would recommend forgoing further screening exams with colonoscopy negative in 2016. She agreed with the plan, she can f/u PRN for this issue.   Madaket Cellar, MD [facility] Gastroenterology Pager [phone]

## 2015-11-22 DIAGNOSIS — J449 Chronic obstructive pulmonary disease, unspecified: Secondary | ICD-10-CM | POA: Diagnosis not present

## 2015-12-22 DIAGNOSIS — J449 Chronic obstructive pulmonary disease, unspecified: Secondary | ICD-10-CM | POA: Diagnosis not present

## 2015-12-24 ENCOUNTER — Other Ambulatory Visit: Payer: Self-pay | Admitting: Radiation Oncology

## 2015-12-24 DIAGNOSIS — Z853 Personal history of malignant neoplasm of breast: Secondary | ICD-10-CM

## 2015-12-29 ENCOUNTER — Other Ambulatory Visit: Payer: Self-pay | Admitting: Internal Medicine

## 2015-12-31 ENCOUNTER — Ambulatory Visit
Admission: RE | Admit: 2015-12-31 | Discharge: 2015-12-31 | Disposition: A | Discharge status: Home / Self Care | Payer: PPO | Source: Ambulatory Visit | Attending: Radiation Oncology | Admitting: Radiation Oncology

## 2015-12-31 DIAGNOSIS — Z853 Personal history of malignant neoplasm of breast: Secondary | ICD-10-CM

## 2015-12-31 DIAGNOSIS — R928 Other abnormal and inconclusive findings on diagnostic imaging of breast: Secondary | ICD-10-CM | POA: Diagnosis not present

## 2016-01-22 DIAGNOSIS — J449 Chronic obstructive pulmonary disease, unspecified: Secondary | ICD-10-CM | POA: Diagnosis not present

## 2016-02-01 ENCOUNTER — Encounter: Payer: Self-pay | Admitting: Internal Medicine

## 2016-02-01 ENCOUNTER — Ambulatory Visit (INDEPENDENT_AMBULATORY_CARE_PROVIDER_SITE_OTHER): Payer: PPO | Admitting: Internal Medicine

## 2016-02-01 VITALS — BP 114/68 | HR 103 | Temp 97.8°F | Ht 64.0 in | Wt 166.0 lb

## 2016-02-01 DIAGNOSIS — Z853 Personal history of malignant neoplasm of breast: Secondary | ICD-10-CM

## 2016-02-01 DIAGNOSIS — Z0000 Encounter for general adult medical examination without abnormal findings: Secondary | ICD-10-CM | POA: Diagnosis not present

## 2016-02-01 DIAGNOSIS — E785 Hyperlipidemia, unspecified: Secondary | ICD-10-CM | POA: Diagnosis not present

## 2016-02-01 DIAGNOSIS — E559 Vitamin D deficiency, unspecified: Secondary | ICD-10-CM | POA: Diagnosis not present

## 2016-02-01 DIAGNOSIS — F259 Schizoaffective disorder, unspecified: Secondary | ICD-10-CM | POA: Diagnosis not present

## 2016-02-01 DIAGNOSIS — Z23 Encounter for immunization: Secondary | ICD-10-CM | POA: Diagnosis not present

## 2016-02-01 DIAGNOSIS — J449 Chronic obstructive pulmonary disease, unspecified: Secondary | ICD-10-CM

## 2016-02-01 DIAGNOSIS — C342 Malignant neoplasm of middle lobe, bronchus or lung: Secondary | ICD-10-CM | POA: Diagnosis not present

## 2016-02-01 LAB — COMPLETE METABOLIC PANEL WITH GFR
ALT: 16 U/L (ref 6–29)
AST: 23 U/L (ref 10–35)
Albumin: 4.6 g/dL (ref 3.6–5.1)
Alkaline Phosphatase: 98 U/L (ref 33–130)
BILIRUBIN TOTAL: 1 mg/dL (ref 0.2–1.2)
BUN: 10 mg/dL (ref 7–25)
CO2: 26 mmol/L (ref 20–31)
CREATININE: 0.83 mg/dL (ref 0.60–0.93)
Calcium: 9.3 mg/dL (ref 8.6–10.4)
Chloride: 106 mmol/L (ref 98–110)
GFR, Est African American: 82 mL/min (ref >=60)
GFR, Est Non African American: 71 mL/min (ref >=60)
Glucose, Bld: 101 mg/dL — ABNORMAL HIGH (ref 65–99)
Potassium: 4.2 mmol/L (ref 3.5–5.3)
Sodium: 143 mmol/L (ref 135–146)
TOTAL PROTEIN: 7.4 g/dL (ref 6.1–8.1)

## 2016-02-01 LAB — CBC WITH DIFFERENTIAL/PLATELET
BASOS PCT: 0 %
Basophils Absolute: 0 cells/uL (ref 0–200)
Eosinophils Absolute: 45 cells/uL (ref 15–500)
Eosinophils Relative: 1 %
HCT: 40.1 % (ref 35.0–45.0)
HEMOGLOBIN: 13.4 g/dL (ref 11.7–15.5)
LYMPHS ABS: 1125 {cells}/uL (ref 850–3900)
Lymphocytes Relative: 25 %
MCH: 30.1 pg (ref 27.0–33.0)
MCHC: 33.4 g/dL (ref 32.0–36.0)
MCV: 90.1 fL (ref 80.0–100.0)
MONOS PCT: 25 %
MPV: 9.9 fL (ref 7.5–12.5)
Monocytes Absolute: 1125 cells/uL — ABNORMAL HIGH (ref 200–950)
NEUTROS ABS: 2205 {cells}/uL (ref 1500–7800)
Neutrophils Relative %: 49 %
PLATELETS: 82 10*3/uL — AB (ref 140–400)
RBC: 4.45 MIL/uL (ref 3.80–5.10)
RDW: 14.2 % (ref 11.0–15.0)
WBC: 4.5 10*3/uL (ref 3.8–10.8)

## 2016-02-01 LAB — POCT URINALYSIS DIPSTICK
Bilirubin, UA: NEGATIVE
Glucose, UA: NEGATIVE
Ketones, UA: NEGATIVE
Leukocytes, UA: NEGATIVE
Nitrite, UA: NEGATIVE
PH UA: 5
PROTEIN UA: NEGATIVE
RBC UA: NEGATIVE
SPEC GRAV UA: 1.015
Urobilinogen, UA: 0.2

## 2016-02-01 LAB — LIPID PANEL
Cholesterol: 153 mg/dL (ref 125–200)
HDL: 75 mg/dL (ref >=46)
LDL CALC: 46 mg/dL (ref <130)
TRIGLYCERIDES: 159 mg/dL — AB (ref <150)
Total CHOL/HDL Ratio: 2 Ratio (ref <=5.0)
VLDL: 32 mg/dL — ABNORMAL HIGH (ref <30)

## 2016-02-01 LAB — TSH: TSH: 2.38 mIU/L

## 2016-02-01 NOTE — Progress Notes (Signed)
Subjective:    Patient ID: Summer Williams, female    DOB: [birth year], 70 y.o.   MRN: [id number]  HPI 70 year old Female with history of hyperlipidemia in today for health maintenance exam and evaluation of medical issues. Has not been seen here in some time. Has been undergoing treatment for lung cancer and breast cancer. History of COPD. History of right middle lobe lung cancer and left lower inner quadrant breast cancer.  Resides alone since her mother died and seems to be doing okay. History of schizoaffective disorder.   Still drives. Ambulates slowly. Has not had any falls.  She has a history of chronic diarrhea that has responded to Questran twice daily. She had colonoscopy by Dr. Deatra Ina 11/08/2014 showing diverticulosis and internal hemorrhoids but no polyps or tumors. Screening for celiac disease was negative.  She is followed by Dr. Annamaria Boots for COPD. In February 2016 he found a lesion in her right lung centered on the minor fissure. Repeat CT was done May 2016 and lesion was still present and possibly slightly larger. Adenocarcinoma was diagnosed. Has chronic left lower l nodule that is 3 mm in size. This is based on imaging October 2016. She was treated for adenocarcinoma of the lung with radiation. She's done well. Has upcoming CT of the chest with Dr. went worth.  She was treated for breast cancer in the Fall of 2015 with lumpectomy and radiation. She is currently on Arimidex  History of hyperlipidemia treated with statin. History of elevated liver enzymes with workup showing fatty liver. She had ultrasound of the upper abdomen November 2012 showing mild diffuse fatty liver. She is obese and not really motivated to diet and exercise.  She has a history of vitamin D deficiency. Had cholecystectomy 2003 in [place]. Cataract extraction of right eye by Dr. Kathrin Penner in 2005. She uses home oxygen at night. Has prescription for inhalers.  History of alcohol abuse in the remote  past.  No known drug allergies.       Review of Systems as  above     Objective:   Physical Exam  Constitutional: She is oriented to person, place, and time. She appears well-developed and well-nourished. No distress.  HENT:  Head: Normocephalic and atraumatic.  Right Ear: External ear normal.  Left Ear: External ear normal.  Mouth/Throat: Oropharynx is clear and moist. No oropharyngeal exudate.  Eyes: Conjunctivae are normal. Right eye exhibits no discharge. Left eye exhibits no discharge. No scleral icterus.  Neck: Neck supple. No JVD present. No thyromegaly present.  Cardiovascular: Normal rate and regular rhythm.   No murmur heard. Pulmonary/Chest: Effort normal and breath sounds normal. She has no wheezes.  Refuses breast exam  Abdominal: She exhibits no distension and no mass. There is no tenderness. There is no rebound and no guarding.  Genitourinary:  Genitourinary Comments: Refuses pelvic exam  Musculoskeletal: She exhibits no edema.  Lymphadenopathy:    She has no cervical adenopathy.  Neurological: She is alert and oriented to person, place, and time. She has normal reflexes. No cranial nerve deficit.  Skin: Skin is warm and dry. No rash noted. She is not diaphoretic.  Psychiatric: She has a normal mood and affect. Her behavior is normal. Judgment and thought content normal.  Vitals reviewed.         Assessment & Plan:  History of right middle lobe lung cancer treated with radiation -followed by oncology  COPD  Left breast cancer status post treatment-followed by oncology   Schizoaffective  disorder  Hyperlipidemia  History of depression  Plan: Continue same medications and return in one year or as needed and continue follow-up with pulmonologist, oncologist and radiation therapist.  Addendum: CT of chest done 02/06/2016 shows progressive nodular soft tissue in the right middle and lower lobe with increased solid component compared to prior study  suggesting recurrent tumor. Additional new subpleural soft tissue along lateral right lung. Scattered bilateral pulmonary nodules measuring up to 6 mm relatively unchanged since 2016 which favor a benign etiology. Small mediastinal lymph nodes measuring up to 8 mm favor to be reactive.  PET scan 02/15/2016 shows hypermetabolic subpleural nodularity along lateral right chest suspicious for pleural based metastasis. Associated hypermetabolism of the overlying chest wall musculature is worrisome for metastatic involvement. Dr. Lindi Adie is to meet with her September 25  Subjective:   Patient presents for Medicare Annual/Subsequent preventive examination.  Review Past Medical/Family/Social:See above   Risk Factors  Current exercise habits: Sedentary Dietary issues discussed: Yes  Cardiac risk factors:  Depression Screen  (Note: if answer to either of the following is "Yes", a more complete depression screening is indicated)   Over the past two weeks, have you felt down, depressed or hopeless? No  Over the past two weeks, have you felt little interest or pleasure in doing things? No Have you lost interest or pleasure in daily life? No Do you often feel hopeless? No Do you cry easily over simple problems? No   Activities of Daily Living  In your present state of health, do you have any difficulty performing the following activities?:   Driving? No  Managing money? No  Feeding yourself? No  Getting from bed to chair? No  Climbing a flight of stairs? No  Preparing food and eating?: No  Bathing or showering? No  Getting dressed: No  Getting to the toilet? No  Using the toilet:No  Moving around from place to place: No  In the past year have you fallen or had a near fall?:No  Are you sexually active? No  Do you have more than one partner? No   Hearing Difficulties: No  Do you often ask people to speak up or repeat themselves? No  Do you experience ringing or noises in your ears? No    Do you have difficulty understanding soft or whispered voices? No  Do you feel that you have a problem with memory? No Do you often misplace items? No    Home Safety:  Do you have a smoke alarm at your residence? Yes Do you have grab bars in the bathroom?No Do you have throw rugs in your house? Yes   Cognitive Testing  Alert? Yes Normal Appearance?Yes  Oriented to person? Yes Place? Yes  Time? Yes  Recall of three objects? Yes  Can perform simple calculations? Yes  Displays appropriate judgment?Yes  Can read the correct time from a watch face?Yes   List the Names of Other Physician/Practitioners you currently use:  See referral list for the physicians patient is currently seeing.     Review of Systems: See above   Objective:     General appearance: Appears stated age and mildly obese  Head: Normocephalic, without obvious abnormality, atraumatic  Eyes: conj clear, EOMi PEERLA  Ears: normal TM's and external ear canals both ears  Nose: Nares normal. Septum midline. Mucosa normal. No drainage or sinus tenderness.  Throat: lips, mucosa, and tongue normal; teeth and gums normal  Neck: no adenopathy, no carotid bruit, no JVD, supple, symmetrical,  trachea midline and thyroid not enlarged, symmetric, no tenderness/mass/nodules  No CVA tenderness.   Heart: regular rate and rhythm, S1, S2 normal, no murmur, click, rub or gallop  Abdomen: soft, non-tender; bowel sounds normal; no masses, no organomegaly  Musculoskeletal: ROM normal in all joints, no crepitus, no deformity, Normal muscle strengthen. Back  is symmetric, no curvature. Skin: Skin color, texture, turgor normal. No rashes or lesions  Lymph nodes: Cervical, supraclavicular, and axillary nodes normal.  Neurologic: CN 2 -12 Normal, Normal symmetric reflexes. Normal coordination and gait  Psych: Alert & Oriented x 3, Mood appear stable.    Assessment:    Annual wellness medicare exam   Plan:    During the course  of the visit the patient was educated and counseled about appropriate screening and preventive services including:   She declines breast exam and Pap smear. Flu vaccine given 02/01/2016     Patient Instructions (the written plan) was given to the patient.  Medicare Attestation  I have personally reviewed:  The patient's medical and social history  Their use of alcohol, tobacco or illicit drugs  Their current medications and supplements  The patient's functional ability including ADLs,fall risks, home safety risks, cognitive, and hearing and visual impairment  Diet and physical activities  Evidence for depression or mood disorders  The patient's weight, height, BMI, and visual acuity have been recorded in the chart. I have made referrals, counseling, and provided education to the patient based on review of the above and I have provided the patient with a written personalized care plan for preventive services.

## 2016-02-01 NOTE — Patient Instructions (Signed)
It was pleasure to see you today. Lab work drawn and pending. Return in one year or as needed.

## 2016-02-02 LAB — VITAMIN D 25 HYDROXY (VIT D DEFICIENCY, FRACTURES): Vit D, 25-Hydroxy: 55 ng/mL (ref 30–100)

## 2016-02-06 ENCOUNTER — Ambulatory Visit (HOSPITAL_COMMUNITY)
Admission: RE | Admit: 2016-02-06 | Discharge: 2016-02-06 | Disposition: A | Discharge status: Home / Self Care | Payer: PPO | Source: Ambulatory Visit | Attending: Radiation Oncology | Admitting: Radiation Oncology

## 2016-02-06 ENCOUNTER — Encounter (HOSPITAL_COMMUNITY): Payer: Self-pay

## 2016-02-06 DIAGNOSIS — Z853 Personal history of malignant neoplasm of breast: Secondary | ICD-10-CM | POA: Diagnosis not present

## 2016-02-06 DIAGNOSIS — R918 Other nonspecific abnormal finding of lung field: Secondary | ICD-10-CM | POA: Insufficient documentation

## 2016-02-06 DIAGNOSIS — C3490 Malignant neoplasm of unspecified part of unspecified bronchus or lung: Secondary | ICD-10-CM | POA: Diagnosis not present

## 2016-02-06 DIAGNOSIS — C50312 Malignant neoplasm of lower-inner quadrant of left female breast: Secondary | ICD-10-CM

## 2016-02-07 ENCOUNTER — Ambulatory Visit
Admission: RE | Admit: 2016-02-07 | Discharge: 2016-02-07 | Disposition: A | Discharge status: Home / Self Care | Payer: PPO | Source: Ambulatory Visit | Attending: Radiation Oncology | Admitting: Radiation Oncology

## 2016-02-07 ENCOUNTER — Encounter: Payer: Self-pay | Admitting: Radiation Oncology

## 2016-02-07 VITALS — BP 131/77 | HR 88 | Temp 97.8°F | Wt 170.4 lb

## 2016-02-07 DIAGNOSIS — C342 Malignant neoplasm of middle lobe, bronchus or lung: Secondary | ICD-10-CM | POA: Diagnosis not present

## 2016-02-07 DIAGNOSIS — J449 Chronic obstructive pulmonary disease, unspecified: Secondary | ICD-10-CM | POA: Insufficient documentation

## 2016-02-07 DIAGNOSIS — E785 Hyperlipidemia, unspecified: Secondary | ICD-10-CM | POA: Diagnosis not present

## 2016-02-07 DIAGNOSIS — K648 Other hemorrhoids: Secondary | ICD-10-CM | POA: Diagnosis not present

## 2016-02-07 DIAGNOSIS — Z87891 Personal history of nicotine dependence: Secondary | ICD-10-CM | POA: Diagnosis not present

## 2016-02-07 DIAGNOSIS — F329 Major depressive disorder, single episode, unspecified: Secondary | ICD-10-CM | POA: Insufficient documentation

## 2016-02-07 DIAGNOSIS — Z853 Personal history of malignant neoplasm of breast: Secondary | ICD-10-CM | POA: Diagnosis not present

## 2016-02-07 DIAGNOSIS — Z85828 Personal history of other malignant neoplasm of skin: Secondary | ICD-10-CM | POA: Diagnosis not present

## 2016-02-07 DIAGNOSIS — C3411 Malignant neoplasm of upper lobe, right bronchus or lung: Secondary | ICD-10-CM | POA: Insufficient documentation

## 2016-02-07 DIAGNOSIS — C3481 Malignant neoplasm of overlapping sites of right bronchus and lung: Secondary | ICD-10-CM | POA: Diagnosis not present

## 2016-02-07 NOTE — Progress Notes (Signed)
Department of Radiation Oncology  Phone:  [phone] Fax:        [phone]   Name: Summer Williams MRN: [id number]  DOB: February 27, 1946  Date: 02/07/2016  Follow Up Visit Note  Diagnosis: Stage IB, T2a, N0, M0 NSCLC, adenocarcinoma of the right upper lobe.  Narrative:   On review of systems, the patient reports that she is doing well overall. She denies any chest pain, shortness of breath, cough, fevers, chills, night sweats, unintended weight changes. She  denies any bowel or bladder disturbances, and denies abdominal pain, nausea or vomiting. She denies any new musculoskeletal or joint aches or pains. A complete review of systems is obtained and is otherwise negative.  Summary and Interval since last radiation: 12 months   12/13/2014-01/19/2015. The site and dose used included Right upper/middle lobe of lung, 2.6 Gy/frac for total of 70.2 Gy. The beams and energy used included 3D-Conformal, 6 and 10 MV photons.   03/13/14-04/14/14: 42.5 GY to the left breast in 17 fractions  Past Medical History:  Past Medical History:  Diagnosis Date  . Breast cancer ([place]) 01/05/14   left breast  . COPD (chronic obstructive pulmonary disease) ([place])   . Depression   . Diverticulosis   . History of alcohol abuse    last 1980  . Hyperlipidemia   . Internal hemorrhoids   . Lung cancer ([place])   . Oxygen deficiency   . Radiation 03/23/14-04/19/14   Left breast  . Radiation 12/13/14-01/19/15   right upper/middle lobe of lung 70.2 Gy  . Requires supplemental oxygen    at night USES 2 LITERS   . Shortness of breath     WITH ACTIVITY  . Skin cancer    Squamous  . Vitamin D deficiency   . Wears glasses     Past Surgical History: Past Surgical History:  Procedure Laterality Date  . BREAST SURGERY  02/09/2014   Left luimpectomy  . CHOLECYSTECTOMY  2003  . COLONOSCOPY WITH PROPOFOL N/A 11/08/2014   Procedure: COLONOSCOPY WITH PROPOFOL;  Surgeon: Inda Castle, MD;  Location[facility];  Service: Endoscopy;  Laterality: N/A;  . DENTAL SURGERY     implants  . EYE SURGERY Bilateral 2005   Cataract OD  . VIDEO BRONCHOSCOPY WITH ENDOBRONCHIAL NAVIGATION N/A 11/10/2014   Procedure: VIDEO BRONCHOSCOPY WITH ENDOBRONCHIAL NAVIGATION;  Surgeon: Melrose Nakayama, MD;  Location: [facility] [facility];  Service: Thoracic;  Laterality: N/A;    Social History:  Social History   Social History  . Marital status: Divorced    Spouse name: N/A  . Number of children: 1  . Years of education: N/A   Occupational History  . Retired Education officer, museum    Social History Main Topics  . Smoking status: Former Smoker    Packs/day: 1.00    Years: 50.00    Types: Cigarettes    Quit date: 08/31/2005  . Smokeless tobacco: Never Used  . Alcohol use No     Comment: hx abuse-last 1980  . Drug use: No  . Sexual activity: Not on file   Other Topics Concern  . Not on file   Social History Narrative  . No narrative on file    Family History: Family History  Problem Relation Age of Onset  . Heart disease Father   . Lung cancer Maternal Grandfather   . Leukemia Paternal Grandfather   . Cholelithiasis Mother   . Ovarian cancer Maternal Aunt   . Breast cancer Maternal Aunt   .  Colon cancer Neg Hx   . Colon polyps Neg Hx   . Esophageal cancer Neg Hx   . Kidney disease Neg Hx   . Gallbladder disease Neg Hx   . Stomach cancer Neg Hx   . Rectal cancer Neg Hx    Allergies: No Known Allergies   Medications: Current Outpatient Prescriptions on File Prior to Encounter  Medication Sig Dispense Refill  . anastrozole (ARIMIDEX) 1 MG tablet TAKE 1 TABLET (1 MG TOTAL) BY MOUTH DAILY. 90 tablet 3  . aspirin 325 MG tablet Take 325 mg by mouth daily.    Marland Kitchen atorvastatin (LIPITOR) 40 MG tablet TAKE 1 TABLET BY MOUTH EVERY DAY 30 tablet 1  . Cholecalciferol (VITAMIN D) 2000 UNITS CAPS Take 1 capsule by mouth daily.     . cholestyramine (QUESTRAN) 4 g packet Take 1 packet (4 g total) by mouth 2 (two)  times daily. 180 each 3  . citalopram (CELEXA) 20 MG tablet Take 20 mg by mouth daily. Reported on 06/28/2015    . levalbuterol (XOPENEX) 0.63 MG/3ML nebulizer solution Take 3 mLs (0.63 mg total) by nebulization every 6 (six) hours as needed for wheezing or shortness of breath. 120 mL 12  . magnesium oxide (MAG-OX) 400 MG tablet Take 400 mg by mouth daily.    . Multiple Vitamins-Minerals (MULTIVITAMIN GUMMIES ADULT PO) Take 2 tablets by mouth daily.     Marland Kitchen OLANZapine (ZYPREXA) 7.5 MG tablet Take 7.5 mg by mouth daily.  3  . Omega-3 Fatty Acids (FISH OIL) 1200 MG CAPS Take 1 capsule by mouth daily.    . Probiotic Product (PROBIOTIC DAILY PO) Take 2 tablets by mouth daily. Probiotic gummy    . saccharomyces boulardii (FLORASTOR) 250 MG capsule Take 250 mg by mouth 2 (two) times daily. Reported on 08/02/2015    . Umeclidinium-Vilanterol (ANORO ELLIPTA) 62.5-25 MCG/INH AEPB Inhale 1 puff, once daily 60 each 12   No current facility-administered medications on file prior to encounter.     Physical Exam:   Vitals:   02/07/16 0950  BP: 131/77  Pulse: 88  Temp: 97.8 F (36.6 C)  TempSrc: Oral  SpO2: 95%  Weight: 170 lb 6.4 oz (77.3 kg)    In general this is a well appearing Caucasian female in no acute distress. she's alert and oriented x4 and appropriate throughout the examination. Cardiopulmonary assessment is negative for acute distress and she exhibits normal effort.     IMPRESSION/PLAN: 1. Stage IB, T2a, N0, M0 NSCLC, adenocarcinoma of the right upper lobe. I have reviewed her CT scan and images and we discussed the new finding in the right subpleural space. I recommended that she proceed with a re-staging PET scan to evaluate the activity of this location. Otherwise her previously treated areas of disease appear to be stable. I will review her scan as well with one of my attending MDs and contact her by phone. We did discuss that if the PET was negative, we would likely repeat her next CT in  3 months, however if her PET was positive, we would consider another course of SBRT. She is in agreement and would like to avoid another biopsy if possible. I will follow up with her once her results from PET are available.  2. 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 The patient completed adjuvant radiotherapy to the breast and remains NED and continues Arimidex under the care of Dr. Lindi Adie. We will continue to  follow this expectantly.  In a visit lasting 25 minutes, greater than 50% of the time was spent reviewing her imaging, and coordinating her care.    Carola Rhine, PAC

## 2016-02-07 NOTE — Progress Notes (Signed)
Ms. Mclear reports that her respiratory status "is about the same" and notes SOB when walking up stairs and completing household chores.  Oxygen at night - 2  Liters.  Denies any pain.  Here for results of CT scan  BP 131/77 (BP Location: Right Arm, Patient Position: Sitting, Cuff Size: Normal)   Pulse 88   Temp 97.8 F (36.6 C) (Oral)   Wt 170 lb 6.4 oz (77.3 kg)   SpO2 95%   BMI 29.25 kg/m    Wt Readings from Last 3 Encounters:  02/07/16 170 lb 6.4 oz (77.3 kg)  02/01/16 166 lb (75.3 kg)  11/19/15 168 lb 2 oz (76.3 kg)

## 2016-02-07 NOTE — Addendum Note (Signed)
Encounter addended by: Benn Moulder, RN on: 02/07/2016 10:42 AM<BR>    Actions taken: Charge Capture section accepted

## 2016-02-08 ENCOUNTER — Telehealth: Payer: Self-pay | Admitting: *Deleted

## 2016-02-08 NOTE — Telephone Encounter (Signed)
Called patient to inform of Pet Scan on 02-15-16- arrival time - 9:30 am @ [facility], NPO 6 HRS. Prior to test, lvm for a return call

## 2016-02-15 ENCOUNTER — Ambulatory Visit (HOSPITAL_COMMUNITY)
Admission: RE | Admit: 2016-02-15 | Discharge: 2016-02-15 | Disposition: A | Discharge status: Home / Self Care | Payer: PPO | Source: Ambulatory Visit | Attending: Radiation Oncology | Admitting: Radiation Oncology

## 2016-02-15 DIAGNOSIS — K119 Disease of salivary gland, unspecified: Secondary | ICD-10-CM | POA: Insufficient documentation

## 2016-02-15 DIAGNOSIS — C342 Malignant neoplasm of middle lobe, bronchus or lung: Secondary | ICD-10-CM | POA: Diagnosis not present

## 2016-02-15 DIAGNOSIS — M899 Disorder of bone, unspecified: Secondary | ICD-10-CM | POA: Diagnosis not present

## 2016-02-15 LAB — GLUCOSE, CAPILLARY: Glucose-Capillary: 120 mg/dL — ABNORMAL HIGH (ref 65–99)

## 2016-02-15 MED ORDER — FLUDEOXYGLUCOSE F - 18 (FDG) INJECTION
8.4200 | Freq: Once | INTRAVENOUS | Status: AC | PRN
  Administered 2016-02-15: 8.42 via INTRAVENOUS

## 2016-02-18 ENCOUNTER — Telehealth: Payer: Self-pay | Admitting: *Deleted

## 2016-02-18 NOTE — Telephone Encounter (Signed)
Called Ms. Yinger to inform her that Norton Blizzard is in the process of scheduling her case for discussion in our [facility](Radiology). Norton Blizzard will keep her informed.

## 2016-02-21 ENCOUNTER — Telehealth: Payer: Self-pay | Admitting: Radiation Oncology

## 2016-02-21 NOTE — Telephone Encounter (Signed)
I spoke with the patient by phone to discuss the findings from her PET scan and the review of the recommendations from thoracic oncology conference. I have also spoken with Dr. Lindi Adie, and he will meet with her to discuss systemic options. She is asymptomatic from her chest wall disease, and we will be available for palliative XRT as needed.

## 2016-02-22 DIAGNOSIS — J449 Chronic obstructive pulmonary disease, unspecified: Secondary | ICD-10-CM | POA: Diagnosis not present

## 2016-02-24 ENCOUNTER — Encounter: Payer: Self-pay | Admitting: Internal Medicine

## 2016-02-25 ENCOUNTER — Ambulatory Visit (HOSPITAL_BASED_OUTPATIENT_CLINIC_OR_DEPARTMENT_OTHER): Payer: PPO | Admitting: Hematology and Oncology

## 2016-02-25 ENCOUNTER — Encounter: Payer: Self-pay | Admitting: Hematology and Oncology

## 2016-02-25 DIAGNOSIS — C50312 Malignant neoplasm of lower-inner quadrant of left female breast: Secondary | ICD-10-CM

## 2016-02-25 DIAGNOSIS — Z79811 Long term (current) use of aromatase inhibitors: Secondary | ICD-10-CM

## 2016-02-25 DIAGNOSIS — C342 Malignant neoplasm of middle lobe, bronchus or lung: Secondary | ICD-10-CM

## 2016-02-25 NOTE — Assessment & Plan Note (Signed)
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 Hypermetabolic subpleural nodularity along the lateral right chest wall, suspicious for pleural-based metastasis. 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 Await the results of molecular testing to see if she would benefit from oral therapies. Patient is not a candidate for chemotherapy due to borderline performance status. 2. with evidence of metastatic disease into the ribs in Chest wall, she could be a candidate for immunotherapy. Await the results of PD 1 testing  Return to clinic after the test results available.

## 2016-02-25 NOTE — Assessment & Plan Note (Signed)
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 Bone density shows normal bones. Another bone density will be done October 2017.  Breast cancer surveillance: Mammograms 12/31/2015 were normal Breast exam 08/28/2015: No palpable lumps or nodules. Tenderness underneath the right axilla related to muscular sprain/injury  Return to clinic in 6 months for follow-up

## 2016-02-25 NOTE — Progress Notes (Signed)
Patient Care Team: Elby Showers, MD as PCP - General (Internal Medicine) Nicholas Lose, MD as Consulting Physician (Hematology and Oncology) Erroll Luna, MD as Consulting Physician (General Surgery) Thea Silversmith, MD as Consulting Physician (Radiation Oncology)  DIAGNOSIS: Cancer of lower-inner quadrant of left female breast [facility])   Staging form: Breast, AJCC 7th Edition   - Clinical: Stage IA (T1c, N0, cM0) - Signed by Rulon Eisenmenger, MD on 01/18/2014   - Pathologic: No stage assigned - Unsigned  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 ([place])   Staging form: Lung, AJCC 7th Edition   - Clinical stage from 11/22/2014: T2, N0, M0 - Signed by Thea Silversmith, MD on 11/22/2014  SUMMARY OF ONCOLOGIC HISTORY:   Cancer of lower-inner quadrant of left female breast ([place])   01/05/2014 Initial Diagnosis    Ultrasound-guided core needle biopsy: Cancer of lower-inner quadrant of left female breast, ER 100%, PR 90%, Ki-67: 30%, HER-2 negative (Ratio 1.02), grade 2      01/13/2014 Breast MRI    1.6 cm left breast abnormality, no abnormal lymph nodes.      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 ([place])   11/10/2014 Initial Diagnosis    Right lung biopsy adenocarcinoma TTF-1 strong diffuse expression, CK 7 strong diffuse expression GCDFP negative; estrogen receptor weak      12/13/2014 - 01/19/2015 Radiation Therapy    Right upper/middle lobe of lung,  2.6 Gy/frac for total of 70.2 Gy (definitive radiation)      03/12/2015 Imaging    Right middle and upper lobe spiculated groundglass mass measures 2.7 x 2.23.3 cm previously was 3.4 x 2.44.2 cm, 3 mm right middle lobe nodule, chronic left lower lobe nodule 3 mm      07/31/2015 Imaging    CT chest :similar to slight decrease in the sub-solitary pulmonary nodule (2.3 x 1.7 cm was previously 2.7 x 2.2 cm) in the right upper and middle lobes, treatment-related changes, unchanged pulmonary nodules in right and left lungs      02/15/2016 PET scan    2.0 x 1.5 cm spiculated RML nodule. Hypermetabolic subpleural nodularity along the lateral right chest wall, suspicious for pleural-based metastasis. 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 She had the radiation therapy to the right middle lobe lesion and underwent recent chest imaging that included a PET/CT scan 02/15/2016. She is here today accompanied by her son. She reports that she has a pain in the right chest wall.  REVIEW OF SYSTEMS:   Constitutional: Denies fevers, chills or abnormal weight loss Eyes: Denies blurriness of vision Ears, nose, mouth, throat,  and face: Denies mucositis or sore throat Respiratory: Denies cough, dyspnea or wheezes Cardiovascular: Denies palpitation, chest discomfort Gastrointestinal:  Denies nausea, heartburn or change in bowel habits Skin: Denies abnormal skin rashes Lymphatics: Denies new lymphadenopathy or easy bruising Neurological:Denies numbness, tingling or new weaknesses Behavioral/Psych: Mood is stable, no new changes  Extremities: No lower extremity edema  All other systems were reviewed with the patient and are negative.  I have reviewed the past medical history, past  surgical history, social history and family history with the patient and they are unchanged from previous note.  ALLERGIES:  has No Known Allergies.  MEDICATIONS:  Current Outpatient Prescriptions  Medication Sig Dispense Refill  . anastrozole (ARIMIDEX) 1 MG tablet TAKE 1 TABLET (1 MG TOTAL) BY MOUTH DAILY. 90 tablet 3  . aspirin 325 MG tablet Take 325 mg by mouth daily.    Marland Kitchen atorvastatin (LIPITOR) 40 MG tablet TAKE 1 TABLET BY MOUTH EVERY DAY 30 tablet 1  . Cholecalciferol (VITAMIN D) 2000 UNITS CAPS Take 1 capsule by mouth daily.     . cholestyramine (QUESTRAN) 4 g packet Take 1 packet (4 g total) by mouth 2 (two) times daily. 180 each 3  . citalopram (CELEXA) 20 MG tablet Take 20 mg by mouth daily. Reported on 06/28/2015    . levalbuterol (XOPENEX) 0.63 MG/3ML nebulizer solution Take 3 mLs (0.63 mg total) by nebulization every 6 (six) hours as needed for wheezing or shortness of breath. 120 mL 12  . magnesium oxide (MAG-OX) 400 MG tablet Take 400 mg by mouth daily.    . Multiple Vitamins-Minerals (MULTIVITAMIN GUMMIES ADULT PO) Take 2 tablets by mouth daily.     Marland Kitchen OLANZapine (ZYPREXA) 7.5 MG tablet Take 7.5 mg by mouth daily.  3  . Omega-3 Fatty Acids (FISH OIL) 1200 MG CAPS Take 1 capsule by mouth daily.    . Probiotic Product (PROBIOTIC DAILY PO) Take 2 tablets by mouth daily. Probiotic gummy    . saccharomyces boulardii (FLORASTOR) 250 MG capsule Take 250 mg by mouth 2 (two) times daily. Reported on 08/02/2015    . Umeclidinium-Vilanterol (ANORO ELLIPTA) 62.5-25 MCG/INH AEPB Inhale 1 puff, once daily 60 each 12   No current facility-administered medications for this visit.     PHYSICAL EXAMINATION: ECOG PERFORMANCE STATUS: 1 - Symptomatic but completely ambulatory  Vitals:   02/25/16 1410  BP: 133/69  Pulse: 86  Resp: 18  Temp: 98 F (36.7 C)   Filed Weights   02/25/16 1410  Weight: 171 lb 4.8 oz (77.7 kg)    GENERAL:alert, no distress and comfortable SKIN: skin color,  texture, turgor are normal, no rashes or significant lesions EYES: normal, Conjunctiva are pink and non-injected, sclera clear OROPHARYNX:no exudate, no erythema and lips, buccal mucosa, and tongue normal  NECK: supple, thyroid normal size, non-tender, without nodularity LYMPH:  no palpable lymphadenopathy in the cervical, axillary or inguinal LUNGS: clear to auscultation and percussion with normal breathing effort, Right chest wall tenderness HEART: regular rate & rhythm and no murmurs and no lower extremity edema ABDOMEN:abdomen soft, non-tender and normal bowel sounds MUSCULOSKELETAL:no cyanosis of digits and no clubbing  NEURO: alert & oriented x 3 with fluent speech, no focal motor/sensory deficits EXTREMITIES: No lower extremity edema  LABORATORY DATA:  I have reviewed the data as listed   Chemistry      Component Value Date/Time   NA 143 02/01/2016 0956   NA 142 01/18/2014 1233   K 4.2 02/01/2016 0956   K  4.2 01/18/2014 1233   CL 106 02/01/2016 0956   CO2 26 02/01/2016 0956   CO2 26 01/18/2014 1233   BUN 10 02/01/2016 0956   BUN 9.7 01/18/2014 1233   CREATININE 0.83 02/01/2016 0956   CREATININE 0.8 01/18/2014 1233      Component Value Date/Time   CALCIUM 9.3 02/01/2016 0956   CALCIUM 9.9 01/18/2014 1233   ALKPHOS 98 02/01/2016 0956   ALKPHOS 115 01/18/2014 1233   AST 23 02/01/2016 0956   AST 22 01/18/2014 1233   ALT 16 02/01/2016 0956   ALT 12 01/18/2014 1233   BILITOT 1.0 02/01/2016 0956   BILITOT 0.81 01/18/2014 1233       Lab Results  Component Value Date   WBC 4.5 02/01/2016   HGB 13.4 02/01/2016   HCT 40.1 02/01/2016   MCV 90.1 02/01/2016   PLT 82 (L) 02/01/2016   NEUTROABS 2,205 02/01/2016     ASSESSMENT & PLAN:  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 Hypermetabolic subpleural nodularity along the lateral right chest wall, suspicious for pleural-based metastasis. 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 Await the results of molecular testing to see if she would benefit from oral therapies. Patient is not a candidate for chemotherapy due to borderline performance status. 2. with evidence of metastatic disease into the ribs in Chest wall, she could be a candidate for immunotherapy. Await the results of PD 1 testing 3. Rib pain: Palliative radiation therapy to the right chest wall for the rib pain.  Return to clinic after the test results available.  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 Bone density shows normal bones. Another bone density will be done October 2017.  Breast cancer surveillance: Mammograms 12/31/2015 were normal Breast exam 08/28/2015: No palpable lumps or nodules. Tenderness underneath the right axilla related to muscular sprain/injury  Return to clinic in 3 weeks for follow-up to discuss the results of molecular tests.   No  orders of the defined types were placed in this encounter.  The patient has a good understanding of the overall plan. she agrees with it. she will call with any problems that may develop before the next visit here.   Rulon Eisenmenger, MD 02/25/16

## 2016-02-26 ENCOUNTER — Telehealth: Payer: Self-pay | Admitting: *Deleted

## 2016-02-26 ENCOUNTER — Other Ambulatory Visit: Payer: Self-pay | Admitting: Internal Medicine

## 2016-02-26 ENCOUNTER — Encounter (HOSPITAL_COMMUNITY): Payer: Self-pay

## 2016-02-26 NOTE — Telephone Encounter (Signed)
Called [facility] pathology @ [phone].  Requested PD1 and [facility] on sample SZA [id number]. Lung biopsy from 11/10/14.  Pathology states one block had already been sent to [facility] and was insufficient tissue. They have not heard back from the second sample. Will follow up and let us know if these can be run.

## 2016-02-29 ENCOUNTER — Ambulatory Visit: Payer: PPO | Admitting: Hematology and Oncology

## 2016-03-03 ENCOUNTER — Telehealth: Payer: Self-pay

## 2016-03-03 NOTE — Telephone Encounter (Signed)
Pt returned my call.  I let pt know she needs to come in 30 min early on 10/16 for lab work.  I also let her know there was insufficient sample to run the studies he wanted done; he will discuss treatment options with her on the 16th.  Pt voiced understanding.

## 2016-03-05 ENCOUNTER — Telehealth: Payer: Self-pay

## 2016-03-05 NOTE — Telephone Encounter (Signed)
Per request from Dr. Lindi Adie, pt needs blood sample obtained for [facility] test.  Pt scheduled for lab appt on 10/16 however Dr. Lindi Adie hoping pt could come in sooner. Called to discuss with pt who agrees to come in tomorrow for this blood collection. Message sent to scheduler.

## 2016-03-06 ENCOUNTER — Other Ambulatory Visit: Payer: Self-pay

## 2016-03-06 ENCOUNTER — Other Ambulatory Visit (HOSPITAL_BASED_OUTPATIENT_CLINIC_OR_DEPARTMENT_OTHER): Payer: PPO

## 2016-03-06 DIAGNOSIS — C342 Malignant neoplasm of middle lobe, bronchus or lung: Secondary | ICD-10-CM

## 2016-03-06 DIAGNOSIS — C50312 Malignant neoplasm of lower-inner quadrant of left female breast: Secondary | ICD-10-CM

## 2016-03-10 ENCOUNTER — Telehealth: Payer: Self-pay | Admitting: *Deleted

## 2016-03-10 ENCOUNTER — Ambulatory Visit (HOSPITAL_BASED_OUTPATIENT_CLINIC_OR_DEPARTMENT_OTHER): Payer: PPO | Admitting: Hematology and Oncology

## 2016-03-10 ENCOUNTER — Telehealth: Payer: Self-pay | Admitting: Radiation Oncology

## 2016-03-10 ENCOUNTER — Encounter: Payer: Self-pay | Admitting: Hematology and Oncology

## 2016-03-10 DIAGNOSIS — D893 Immune reconstitution syndrome: Secondary | ICD-10-CM

## 2016-03-10 DIAGNOSIS — C342 Malignant neoplasm of middle lobe, bronchus or lung: Secondary | ICD-10-CM | POA: Diagnosis not present

## 2016-03-10 DIAGNOSIS — G893 Neoplasm related pain (acute) (chronic): Secondary | ICD-10-CM

## 2016-03-10 DIAGNOSIS — C50512 Malignant neoplasm of lower-outer quadrant of left female breast: Secondary | ICD-10-CM

## 2016-03-10 DIAGNOSIS — Z170 Estrogen receptor positive status [ER+]: Secondary | ICD-10-CM

## 2016-03-10 DIAGNOSIS — C50312 Malignant neoplasm of lower-inner quadrant of left female breast: Secondary | ICD-10-CM

## 2016-03-10 DIAGNOSIS — Z79811 Long term (current) use of aromatase inhibitors: Secondary | ICD-10-CM

## 2016-03-10 DIAGNOSIS — Z66 Do not resuscitate: Secondary | ICD-10-CM

## 2016-03-10 DIAGNOSIS — C7951 Secondary malignant neoplasm of bone: Secondary | ICD-10-CM | POA: Diagnosis not present

## 2016-03-10 MED ORDER — OXYCODONE-ACETAMINOPHEN 5-325 MG PO TABS: 1.0000 | ORAL_TABLET | Freq: Three times a day (TID) | ORAL | 0 refills | Status: DC | PRN

## 2016-03-10 NOTE — Telephone Encounter (Signed)
"  My sister has indigestion, Pepto bismol isn't working.  We need some help before she starts chemotherapy tomorrow.

## 2016-03-10 NOTE — Telephone Encounter (Signed)
Received message inquiring about referral. Spoke with Shona Simpson, PA-C who verbalized that patient wasn't experiencing pain during their last encounter. Phoned patient to inquire about present state. Patient reports that in the last few days her right rib (just under her breast) pain has become constant and worse with ambulation and when she coughs or sneezes. Reports she has oxycodone acetaminophen to manage her pain but, "doesn't get much relief." Patient understands this RN will verbalize these finding to Shona Simpson, PA-C and phone her in the morning with further instructions about the next steps in her care.

## 2016-03-10 NOTE — Progress Notes (Signed)
Patient Care Team: Elby Showers, MD as PCP - General (Internal Medicine) Nicholas Lose, MD as Consulting Physician (Hematology and Oncology) Erroll Luna, MD as Consulting Physician (General Surgery) Thea Silversmith, MD as Consulting Physician (Radiation Oncology)  DIAGNOSIS: Cancer of lower-inner quadrant of left female breast [facility])   Staging form: Breast, AJCC 7th Edition   - Clinical: Stage IA (T1c, N0, cM0) - Signed by Rulon Eisenmenger, MD on 01/18/2014   - Pathologic: No stage assigned - Unsigned  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 ([place])   Staging form: Lung, AJCC 7th Edition   - Clinical stage from 11/22/2014: T2, N0, M0 - Signed by Thea Silversmith, MD on 11/22/2014  SUMMARY OF ONCOLOGIC HISTORY:   Cancer of lower-inner quadrant of left female breast ([place])   01/05/2014 Initial Diagnosis    Ultrasound-guided core needle biopsy: Cancer of lower-inner quadrant of left female breast, ER 100%, PR 90%, Ki-67: 30%, HER-2 negative (Ratio 1.02), grade 2      01/13/2014 Breast MRI    1.6 cm left breast abnormality, no abnormal lymph nodes.      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 ([place])   11/10/2014 Initial Diagnosis    Right lung biopsy adenocarcinoma TTF-1 strong diffuse expression, CK 7 strong diffuse expression GCDFP negative; estrogen receptor weak      12/13/2014 - 01/19/2015 Radiation Therapy    Right upper/middle lobe of lung,  2.6 Gy/frac for total of 70.2 Gy (definitive radiation)      03/12/2015 Imaging    Right middle and upper lobe spiculated groundglass mass measures 2.7 x 2.23.3 cm previously was 3.4 x 2.44.2 cm, 3 mm right middle lobe nodule, chronic left lower lobe nodule 3 mm      07/31/2015 Imaging    CT chest :similar to slight decrease in the sub-solitary pulmonary nodule (2.3 x 1.7 cm was previously 2.7 x 2.2 cm) in the right upper and middle lobes, treatment-related changes, unchanged pulmonary nodules in right and left lungs      02/15/2016 PET scan    2.0 x 1.5 cm spiculated RML nodule. Hypermetabolic subpleural nodularity along the lateral right chest wall, suspicious for pleural-based metastasis. 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 The pain is worse with pressure. She also has pain when she takes a deep breath. This has gotten worse since the last visit with [place]. She feels her entire right side of the body is hurting because of his pain.  REVIEW OF SYSTEMS:  Constitutional: Denies fevers, chills or abnormal weight loss Eyes: Denies blurriness of vision Ears, nose, mouth, throat, and face: Denies mucositis or sore throat Respiratory: Denies cough, dyspnea or wheezes Cardiovascular: Denies palpitation, chest discomfort Gastrointestinal:  Denies nausea, heartburn or change in bowel habits Skin: Denies abnormal skin rashes Lymphatics: Denies new lymphadenopathy or easy bruising Neurological:Denies numbness, tingling or new weaknesses Behavioral/Psych: Mood is stable, no new changes  Extremities: No lower extremity edema  All other systems were reviewed with the  patient and are negative.  I have reviewed the past medical history, past surgical history, social history and family history with the patient and they are unchanged from previous note.  ALLERGIES:  has No Known Allergies.  MEDICATIONS:  Current Outpatient Prescriptions  Medication Sig Dispense Refill  . anastrozole (ARIMIDEX) 1 MG tablet TAKE 1 TABLET (1 MG TOTAL) BY MOUTH DAILY. 90 tablet 3  . aspirin 325 MG tablet Take 325 mg by mouth daily.    Marland Kitchen atorvastatin (LIPITOR) 40 MG tablet TAKE 1 TABLET BY MOUTH EVERY DAY 30 tablet 5  . Cholecalciferol (VITAMIN D) 2000 UNITS CAPS Take 1 capsule by mouth daily.     . cholestyramine (QUESTRAN) 4 g packet Take 1 packet (4 g total) by mouth 2 (two) times daily. 180 each 3  . citalopram (CELEXA) 20 MG tablet Take 20 mg by mouth daily. Reported on 06/28/2015    . levalbuterol (XOPENEX) 0.63 MG/3ML nebulizer solution Take 3 mLs (0.63 mg total) by nebulization every 6 (six) hours as needed for wheezing or shortness of breath. 120 mL 12  . magnesium oxide (MAG-OX) 400 MG tablet Take 400 mg by mouth daily.    . Multiple Vitamins-Minerals (MULTIVITAMIN GUMMIES ADULT PO) Take 2 tablets by mouth daily.     Marland Kitchen OLANZapine (ZYPREXA) 7.5 MG tablet Take 7.5 mg by mouth daily.  3  . Omega-3 Fatty Acids (FISH OIL) 1200 MG CAPS Take 1 capsule by mouth daily.    Marland Kitchen oxyCODONE-acetaminophen (PERCOCET/ROXICET) 5-325 MG tablet Take 1 tablet by mouth every 8 (eight) hours as needed for severe pain. 60 tablet 0  . Probiotic Product (PROBIOTIC DAILY PO) Take 2 tablets by mouth daily. Probiotic gummy    . saccharomyces boulardii (FLORASTOR) 250 MG capsule Take 250 mg by mouth 2 (two) times daily. Reported on 08/02/2015    . Umeclidinium-Vilanterol (ANORO ELLIPTA) 62.5-25 MCG/INH AEPB Inhale 1 puff, once daily 60 each 12   No current facility-administered medications for this visit.     PHYSICAL EXAMINATION: ECOG PERFORMANCE STATUS: 1 - Symptomatic but completely  ambulatory  Blood pressure 06/22/1971, pulse rate 120, respiratory 20, temperature 98.5  GENERAL:alert, no distress and comfortable SKIN: skin color, texture, turgor are normal, no rashes or significant lesions EYES: normal, Conjunctiva are pink and non-injected, sclera clear OROPHARYNX:no exudate, no erythema and lips, buccal mucosa, and tongue normal  NECK: supple, thyroid normal size, non-tender, without nodularity LYMPH:  no palpable lymphadenopathy in the cervical, axillary or inguinal LUNGS: right rib cage pain and tenderness HEART: regular rate & rhythm and no murmurs and no lower extremity edema ABDOMEN:abdomen soft, non-tender and normal bowel sounds MUSCULOSKELETAL:no cyanosis of digits and no clubbing  NEURO: alert & oriented x 3 with fluent speech, no focal motor/sensory deficits EXTREMITIES: No lower extremity edema  LABORATORY DATA:  I have reviewed the data as listed   Chemistry      Component Value Date/Time   NA 143 02/01/2016 0956   NA 142 01/18/2014 1233   K  4.2 02/01/2016 0956   K 4.2 01/18/2014 1233   CL 106 02/01/2016 0956   CO2 26 02/01/2016 0956   CO2 26 01/18/2014 1233   BUN 10 02/01/2016 0956   BUN 9.7 01/18/2014 1233   CREATININE 0.83 02/01/2016 0956   CREATININE 0.8 01/18/2014 1233      Component Value Date/Time   CALCIUM 9.3 02/01/2016 0956   CALCIUM 9.9 01/18/2014 1233   ALKPHOS 98 02/01/2016 0956   ALKPHOS 115 01/18/2014 1233   AST 23 02/01/2016 0956   AST 22 01/18/2014 1233   ALT 16 02/01/2016 0956   ALT 12 01/18/2014 1233   BILITOT 1.0 02/01/2016 0956   BILITOT 0.81 01/18/2014 1233       Lab Results  Component Value Date   WBC 4.5 02/01/2016   HGB 13.4 02/01/2016   HCT 40.1 02/01/2016   MCV 90.1 02/01/2016   PLT 82 (L) 02/01/2016   NEUTROABS 2,205 02/01/2016     ASSESSMENT & PLAN:  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 Hypermetabolic subpleural nodularity along the lateral right chest wall, suspicious for pleural-based metastasis. 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 There is not enough biopsy material for molecular testing. We have sent for EGFR mutation blood test. 2. with evidence of metastatic disease into the ribs in Chest wall, she could be a candidate for immunotherapy.   Right Rib pain: Related to bone metastases. We are awaiting radiation oncology to start palliative radiation. Because of pain got excruciating the worse, I'm sending a prescription for Percocets today. I discussed with her extensively about opioid-induced constipation and its prevention.  Return to clinic after radiation is complete. We will consider bisphosphonate therapy for bone metastases.    No orders of the defined types were placed in this encounter.  The patient has a good understanding of the overall plan. she agrees with it. she will call with any problems that may develop before the next visit here.   Rulon Eisenmenger, MD 03/10/16

## 2016-03-10 NOTE — Telephone Encounter (Signed)
Pauline Aus, Rn called about patient previous Dr. Pablo Ledger, now  Assigned to Dr. Tammi Klippel, Shona Simpson saw patient 02/07/16 for follow up, per Tammy, Dr. Lindi Adie has put in referral for palliative radiation, no  appt has been made as yet, will give this information to Dr. Johny Shears RN,Samantha Presnell 11:16 AM

## 2016-03-10 NOTE — Telephone Encounter (Signed)
Ok let's go ahead and get her in [place]

## 2016-03-10 NOTE — Assessment & Plan Note (Deleted)
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 Hypermetabolic subpleural nodularity along the lateral right chest wall, suspicious for pleural-based metastasis. 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 There is not enough biopsy material for molecular testing. We have sent for EGFR mutation blood test. 2. with evidence of metastatic disease into the ribs in Chest wall, she could be a candidate for immunotherapy.   Right Rib pain: Related to bone metastases. We are awaiting radiation oncology to start palliative radiation. Because of pain got excruciating the worse, I'm sending a prescription for Percocets today. I discussed with her extensively about opioid-induced constipation and its prevention.  Return to clinic after radiation is complete.

## 2016-03-10 NOTE — Assessment & Plan Note (Signed)
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 Hypermetabolic subpleural nodularity along the lateral right chest wall, suspicious for pleural-based metastasis. 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 There is not enough biopsy material for molecular testing. We have sent for EGFR mutation blood test. 2. with evidence of metastatic disease into the ribs in Chest wall, she could be a candidate for immunotherapy.   Right Rib pain: Related to bone metastases. We are awaiting radiation oncology to start palliative radiation. Because of pain got excruciating the worse, I'm sending a prescription for Percocets today. I discussed with her extensively about opioid-induced constipation and its prevention.  Return to clinic after radiation is complete.

## 2016-03-12 ENCOUNTER — Telehealth: Payer: Self-pay | Admitting: *Deleted

## 2016-03-12 NOTE — Telephone Encounter (Signed)
CALLED PATIENT TO INFORM OF FU AND SIM FOR 03-18-16, LVM FOR A RETURN CALL

## 2016-03-13 ENCOUNTER — Ambulatory Visit: Payer: PPO | Admitting: Hematology and Oncology

## 2016-03-14 ENCOUNTER — Other Ambulatory Visit: Payer: Self-pay

## 2016-03-14 ENCOUNTER — Telehealth: Payer: Self-pay | Admitting: Radiation Oncology

## 2016-03-14 DIAGNOSIS — C342 Malignant neoplasm of middle lobe, bronchus or lung: Secondary | ICD-10-CM

## 2016-03-14 NOTE — Telephone Encounter (Signed)
Phoned patient's mobile phone and home phone. No answer on either. Left message detailing upcoming appointments on both phones.

## 2016-03-17 ENCOUNTER — Ambulatory Visit (HOSPITAL_BASED_OUTPATIENT_CLINIC_OR_DEPARTMENT_OTHER): Payer: PPO | Admitting: Hematology and Oncology

## 2016-03-17 ENCOUNTER — Other Ambulatory Visit (HOSPITAL_BASED_OUTPATIENT_CLINIC_OR_DEPARTMENT_OTHER): Payer: PPO

## 2016-03-17 ENCOUNTER — Encounter: Payer: Self-pay | Admitting: Hematology and Oncology

## 2016-03-17 DIAGNOSIS — Z79811 Long term (current) use of aromatase inhibitors: Secondary | ICD-10-CM

## 2016-03-17 DIAGNOSIS — C342 Malignant neoplasm of middle lobe, bronchus or lung: Secondary | ICD-10-CM

## 2016-03-17 DIAGNOSIS — C50312 Malignant neoplasm of lower-inner quadrant of left female breast: Secondary | ICD-10-CM | POA: Diagnosis not present

## 2016-03-17 LAB — COMPREHENSIVE METABOLIC PANEL
ALBUMIN: 3.8 g/dL (ref 3.5–5.0)
ALK PHOS: 96 U/L (ref 40–150)
ALT: 8 U/L (ref 0–55)
AST: 21 U/L (ref 5–34)
Anion Gap: 10 mEq/L (ref 3–11)
BUN: 10.8 mg/dL (ref 7.0–26.0)
CALCIUM: 9.7 mg/dL (ref 8.4–10.4)
CO2: 26 mEq/L (ref 22–29)
CREATININE: 0.9 mg/dL (ref 0.6–1.1)
Chloride: 107 mEq/L (ref 98–109)
EGFR: 66 mL/min/{1.73_m2} — ABNORMAL LOW (ref >90)
Glucose: 85 mg/dl (ref 70–140)
POTASSIUM: 4.3 meq/L (ref 3.5–5.1)
Sodium: 143 mEq/L (ref 136–145)
Total Bilirubin: 0.7 mg/dL (ref 0.20–1.20)
Total Protein: 7.5 g/dL (ref 6.4–8.3)

## 2016-03-17 LAB — CBC WITH DIFFERENTIAL/PLATELET
BASO%: 0.2 % (ref 0.0–2.0)
BASOS ABS: 0 10*3/uL (ref 0.0–0.1)
EOS ABS: 0 10*3/uL (ref 0.0–0.5)
EOS%: 0.8 % (ref 0.0–7.0)
HEMATOCRIT: 37.3 % (ref 34.8–46.6)
HEMOGLOBIN: 12.3 g/dL (ref 11.6–15.9)
LYMPH#: 1.2 10*3/uL (ref 0.9–3.3)
LYMPH%: 22.1 % (ref 14.0–49.7)
MCH: 30.1 pg (ref 25.1–34.0)
MCHC: 33 g/dL (ref 31.5–36.0)
MCV: 91.4 fL (ref 79.5–101.0)
MONO#: 1.2 10*3/uL — AB (ref 0.1–0.9)
MONO%: 22.9 % — ABNORMAL HIGH (ref 0.0–14.0)
NEUT#: 2.9 10*3/uL (ref 1.5–6.5)
NEUT%: 54 % (ref 38.4–76.8)
PLATELETS: 85 10*3/uL — AB (ref 145–400)
RBC: 4.08 10*6/uL (ref 3.70–5.45)
RDW: 13.6 % (ref 11.2–14.5)
WBC: 5.3 10*3/uL (ref 3.9–10.3)

## 2016-03-17 NOTE — Assessment & Plan Note (Signed)
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 Hypermetabolic subpleural nodularity along the lateral right chest wall, suspicious for pleural-based metastasis. 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 There is not enough biopsy material for molecular testing. We have sent for EGFR mutation blood test. 2. with evidence of metastatic disease into the ribs in Chest wall, we could consider palliative chemotherapy.  Right rib pain: Currently on radiation therapy Bone metastases: Patient will need Xgeva every 3 months along with calcium and vitamin D  Return to clinic

## 2016-03-17 NOTE — Progress Notes (Signed)
Patient Care Team: Elby Showers, MD as PCP - General (Internal Medicine) Nicholas Lose, MD as Consulting Physician (Hematology and Oncology) Erroll Luna, MD as Consulting Physician (General Surgery) Thea Silversmith, MD as Consulting Physician (Radiation Oncology)  DIAGNOSIS:  Encounter Diagnosis  Name Primary?  . 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 ([place])     SUMMARY OF ONCOLOGIC HISTORY:   Cancer of lower-inner quadrant of left female breast ([place])   01/05/2014 Initial Diagnosis    Ultrasound-guided core needle biopsy: Cancer of lower-inner quadrant of left female breast, ER 100%, PR 90%, Ki-67: 30%, HER-2 negative (Ratio 1.02), grade 2      01/13/2014 Breast MRI    1.6 cm left breast abnormality, no abnormal lymph nodes.      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 ([place])   11/10/2014 Initial Diagnosis    Right lung biopsy adenocarcinoma TTF-1 strong diffuse expression, CK 7 strong diffuse expression GCDFP negative; estrogen receptor weak      12/13/2014 - 01/19/2015 Radiation Therapy    Right upper/middle lobe of lung, 2.6 Gy/frac for total of 70.2 Gy (definitive radiation)      03/12/2015 Imaging    Right middle and upper lobe spiculated groundglass mass measures 2.7 x 2.23.3 cm previously was 3.4 x 2.44.2 cm, 3 mm right middle lobe nodule, chronic left lower lobe nodule 3 mm      07/31/2015 Imaging    CT chest :similar  to slight decrease in the sub-solitary pulmonary nodule (2.3 x 1.7 cm was previously 2.7 x 2.2 cm) in the right upper and middle lobes, treatment-related changes, unchanged pulmonary nodules in right and left lungs      02/15/2016 PET scan    2.0 x 1.5 cm spiculated RML nodule. Hypermetabolic subpleural nodularity along the lateral right chest wall, suspicious for pleural-based metastasis. 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 She is here accompanied by her son. She has no appointment tomorrow to start radiation. She is noticing that the right rib cage pain is well controlled with current pain medications. She is however still taking 3-4 tablets of Percocets along with ibuprofen. She is accompanied by her son who has a lot of questions about treatment options.  REVIEW OF SYSTEMS:   Constitutional: Denies fevers, chills or abnormal weight loss Eyes: Denies blurriness of vision Ears, nose, mouth, throat, and face: Denies mucositis or sore throat Respiratory: Denies cough, dyspnea or wheezes, right-sided chest wall discomfort Cardiovascular: Denies palpitation, chest discomfort Gastrointestinal:  Denies nausea, heartburn or change in bowel habits Skin: Denies abnormal skin  rashes Lymphatics: Denies new lymphadenopathy or easy bruising Neurological:Denies numbness, tingling or new weaknesses Behavioral/Psych: Mood is stable, no new changes  Extremities: No lower extremity edema  All other systems were reviewed with the patient and are negative.  I have reviewed the past medical history, past surgical history, social history and family history with the patient and they are unchanged from previous  note.  ALLERGIES:  has No Known Allergies.  MEDICATIONS:  Current Outpatient Prescriptions  Medication Sig Dispense Refill  . anastrozole (ARIMIDEX) 1 MG tablet TAKE 1 TABLET (1 MG TOTAL) BY MOUTH DAILY. 90 tablet 3  . aspirin 325 MG tablet Take 325 mg by mouth daily.    Marland Kitchen atorvastatin (LIPITOR) 40 MG tablet TAKE 1 TABLET BY MOUTH EVERY DAY 30 tablet 5  . Cholecalciferol (VITAMIN D) 2000 UNITS CAPS Take 1 capsule by mouth daily.     . cholestyramine (QUESTRAN) 4 g packet Take 1 packet (4 g total) by mouth 2 (two) times daily. 180 each 3  . citalopram (CELEXA) 20 MG tablet Take 20 mg by mouth daily. Reported on 06/28/2015    . levalbuterol (XOPENEX) 0.63 MG/3ML nebulizer solution Take 3 mLs (0.63 mg total) by nebulization every 6 (six) hours as needed for wheezing or shortness of breath. 120 mL 12  . magnesium oxide (MAG-OX) 400 MG tablet Take 400 mg by mouth daily.    . Multiple Vitamins-Minerals (MULTIVITAMIN GUMMIES ADULT PO) Take 2 tablets by mouth daily.     Marland Kitchen OLANZapine (ZYPREXA) 7.5 MG tablet Take 7.5 mg by mouth daily.  3  . Omega-3 Fatty Acids (FISH OIL) 1200 MG CAPS Take 1 capsule by mouth daily.    Marland Kitchen oxyCODONE-acetaminophen (PERCOCET/ROXICET) 5-325 MG tablet Take 1 tablet by mouth every 8 (eight) hours as needed for severe pain. 60 tablet 0  . Probiotic Product (PROBIOTIC DAILY PO) Take 2 tablets by mouth daily. Probiotic gummy    . saccharomyces boulardii (FLORASTOR) 250 MG capsule Take 250 mg by mouth 2 (two) times daily. Reported on 08/02/2015    . Umeclidinium-Vilanterol (ANORO ELLIPTA) 62.5-25 MCG/INH AEPB Inhale 1 puff, once daily 60 each 12   No current facility-administered medications for this visit.     PHYSICAL EXAMINATION: ECOG PERFORMANCE STATUS: 1 - Symptomatic but completely ambulatory  Vitals:   03/17/16 1417  BP: (!) 153/69  Pulse: 88  Resp: 19  Temp: 98 F (36.7 C)   Filed Weights   03/17/16 1417  Weight: 172 lb 14.4 oz (78.4 kg)    GENERAL:alert, no  distress and comfortable SKIN: skin color, texture, turgor are normal, no rashes or significant lesions EYES: normal, Conjunctiva are pink and non-injected, sclera clear OROPHARYNX:no exudate, no erythema and lips, buccal mucosa, and tongue normal  NECK: supple, thyroid normal size, non-tender, without nodularity LYMPH:  no palpable lymphadenopathy in the cervical, axillary or inguinal LUNGS:Diminished breath sounds bilaterally HEART: regular rate & rhythm and no murmurs and no lower extremity edema ABDOMEN:abdomen soft, non-tender and normal bowel sounds MUSCULOSKELETAL:no cyanosis of digits and no clubbing  NEURO: alert & oriented x 3 with fluent speech, no focal motor/sensory deficits EXTREMITIES: No lower extremity edema  LABORATORY DATA:  I have reviewed the data as listed   Chemistry      Component Value Date/Time   NA 143 02/01/2016 0956   NA 142 01/18/2014 1233   K 4.2 02/01/2016 0956   K 4.2 01/18/2014 1233   CL 106 02/01/2016 0956   CO2 26 02/01/2016 0956  CO2 26 01/18/2014 1233   BUN 10 02/01/2016 0956   BUN 9.7 01/18/2014 1233   CREATININE 0.83 02/01/2016 0956   CREATININE 0.8 01/18/2014 1233      Component Value Date/Time   CALCIUM 9.3 02/01/2016 0956   CALCIUM 9.9 01/18/2014 1233   ALKPHOS 98 02/01/2016 0956   ALKPHOS 115 01/18/2014 1233   AST 23 02/01/2016 0956   AST 22 01/18/2014 1233   ALT 16 02/01/2016 0956   ALT 12 01/18/2014 1233   BILITOT 1.0 02/01/2016 0956   BILITOT 0.81 01/18/2014 1233       Lab Results  Component Value Date   WBC 5.3 03/17/2016   HGB 12.3 03/17/2016   HCT 37.3 03/17/2016   MCV 91.4 03/17/2016   PLT 85 (L) 03/17/2016   NEUTROABS 2.9 03/17/2016     ASSESSMENT & PLAN:  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 Hypermetabolic subpleural nodularity along the lateral right chest wall, suspicious for pleural-based metastasis. 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 There is not enough biopsy material for molecular testing. [facility] is negative for EGFR mutation blood test. 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 However it is unclear if these mutations are the driver mutations. We do not have any data for their use and lung cancer.  2. with evidence of metastatic disease into the ribs in Chest wall, we could consider palliative chemotherapy. I discussed the role of single agent carboplatin given every 3 weeks to assess tolerability and she does tolerate it consider adding Taxol. I discussed with her about the entire treatment related side effects.  Right rib pain: Starting radiation therapy tomorrow Bone metastases: Patient will need Xgeva every 3 months along with calcium and vitamin D  Return to clinic in one month to discuss it with the patient wants to go on chemotherapy or would like to pursue hospice care are watchful waiting until she becomes symptomatic.    No orders of the defined types were placed in this encounter.  The patient has a good understanding of the overall plan. she agrees with it. she will call with any problems that may  develop before the next visit here.   GRulon Eisenmenger MD 03/17/16

## 2016-03-18 ENCOUNTER — Telehealth: Payer: Self-pay | Admitting: *Deleted

## 2016-03-18 ENCOUNTER — Ambulatory Visit: Payer: PPO | Admitting: Radiation Oncology

## 2016-03-18 ENCOUNTER — Ambulatory Visit: Admission: RE | Admit: 2016-03-18 | Payer: PPO | Source: Ambulatory Visit | Admitting: Radiation Oncology

## 2016-03-18 NOTE — Telephone Encounter (Signed)
Called patient to inform that fu and sim have been moved to 03-20-16- 8:30 am - doc. And 9 am- sim , spoke with patient and she is aware of this appt. Change and is good with this change.

## 2016-03-19 ENCOUNTER — Telehealth: Payer: Self-pay | Admitting: *Deleted

## 2016-03-19 NOTE — Progress Notes (Signed)
Histology and Location of Primary Cancer: Adenocarcinoma of the Right Upper Lobe/Middle Lobe   Sites of Visceral and Bony Metastatic Disease: Bone Mets to Right Anterolateral 7th rib  Location(s) of Symptomatic Metastases:  Right Anterolateral 7th rib  Past/Anticipated chemotherapy by medical oncology, if any:   Pain on a scale of 0-10 is: 1 in right anterior rib    If Spine Met(s), symptoms, if any, include:  Bowel/Bladder retention or incontinence (please describe):No  Numbness or weakness in extremities (please describe): No  Current Decadron regimen, if applicable: No  Ambulatory status? Walker? Wheelchair?: Ambulatory  SAFETY ISSUES: Prior radiation? 12/13/2014-01/19/2015. The site and dose used included Right upper/middle lobe of lung, 2.6 Gy/frac for total of 70.2 Gy. The beams and energy used included 3D-Conformal, 6 and 10 MV photons. 03/13/14-04/14/14: 42.5 GY to the left breast in 17 fractions   Pacemaker/ICD? No  Possible current pregnancy? No  Is the patient on methotrexate? No  Current Complaints / other details:    Has concerns regarding pain whil lying on the treatment table.

## 2016-03-19 NOTE — Telephone Encounter (Signed)
Called patient to ask about coming in for [place] appt. On 03-20-16 @ 8 am, spoke with patient and she said that she would be here

## 2016-03-20 ENCOUNTER — Ambulatory Visit
Admission: RE | Admit: 2016-03-20 | Discharge: 2016-03-20 | Disposition: A | Discharge status: Home / Self Care | Payer: PPO | Source: Ambulatory Visit | Attending: Radiation Oncology | Admitting: Radiation Oncology

## 2016-03-20 ENCOUNTER — Encounter: Payer: Self-pay | Admitting: Radiation Oncology

## 2016-03-20 ENCOUNTER — Telehealth: Payer: Self-pay | Admitting: *Deleted

## 2016-03-20 VITALS — BP 147/76 | HR 67 | Temp 97.8°F | Resp 16 | Ht 64.0 in | Wt 174.6 lb

## 2016-03-20 DIAGNOSIS — J449 Chronic obstructive pulmonary disease, unspecified: Secondary | ICD-10-CM | POA: Diagnosis not present

## 2016-03-20 DIAGNOSIS — Z510 Encounter for antineoplastic radiation therapy: Secondary | ICD-10-CM | POA: Diagnosis not present

## 2016-03-20 DIAGNOSIS — Z87891 Personal history of nicotine dependence: Secondary | ICD-10-CM | POA: Diagnosis not present

## 2016-03-20 DIAGNOSIS — K648 Other hemorrhoids: Secondary | ICD-10-CM | POA: Insufficient documentation

## 2016-03-20 DIAGNOSIS — Z9889 Other specified postprocedural states: Secondary | ICD-10-CM | POA: Insufficient documentation

## 2016-03-20 DIAGNOSIS — Z853 Personal history of malignant neoplasm of breast: Secondary | ICD-10-CM | POA: Diagnosis not present

## 2016-03-20 DIAGNOSIS — C50912 Malignant neoplasm of unspecified site of left female breast: Secondary | ICD-10-CM | POA: Diagnosis not present

## 2016-03-20 DIAGNOSIS — E785 Hyperlipidemia, unspecified: Secondary | ICD-10-CM | POA: Insufficient documentation

## 2016-03-20 DIAGNOSIS — Z85828 Personal history of other malignant neoplasm of skin: Secondary | ICD-10-CM | POA: Insufficient documentation

## 2016-03-20 DIAGNOSIS — F329 Major depressive disorder, single episode, unspecified: Secondary | ICD-10-CM | POA: Diagnosis not present

## 2016-03-20 DIAGNOSIS — Z7982 Long term (current) use of aspirin: Secondary | ICD-10-CM | POA: Insufficient documentation

## 2016-03-20 DIAGNOSIS — C7951 Secondary malignant neoplasm of bone: Secondary | ICD-10-CM | POA: Diagnosis not present

## 2016-03-20 DIAGNOSIS — Z170 Estrogen receptor positive status [ER+]: Secondary | ICD-10-CM | POA: Insufficient documentation

## 2016-03-20 DIAGNOSIS — Z801 Family history of malignant neoplasm of trachea, bronchus and lung: Secondary | ICD-10-CM | POA: Insufficient documentation

## 2016-03-20 DIAGNOSIS — Z8051 Family history of malignant neoplasm of kidney: Secondary | ICD-10-CM | POA: Diagnosis not present

## 2016-03-20 DIAGNOSIS — C50312 Malignant neoplasm of lower-inner quadrant of left female breast: Secondary | ICD-10-CM

## 2016-03-20 DIAGNOSIS — C3411 Malignant neoplasm of upper lobe, right bronchus or lung: Secondary | ICD-10-CM | POA: Insufficient documentation

## 2016-03-20 DIAGNOSIS — Z79899 Other long term (current) drug therapy: Secondary | ICD-10-CM | POA: Diagnosis not present

## 2016-03-20 DIAGNOSIS — E559 Vitamin D deficiency, unspecified: Secondary | ICD-10-CM | POA: Insufficient documentation

## 2016-03-20 DIAGNOSIS — Z800 Family history of malignant neoplasm of digestive organs: Secondary | ICD-10-CM | POA: Diagnosis not present

## 2016-03-20 DIAGNOSIS — Z8249 Family history of ischemic heart disease and other diseases of the circulatory system: Secondary | ICD-10-CM | POA: Insufficient documentation

## 2016-03-20 DIAGNOSIS — Z9049 Acquired absence of other specified parts of digestive tract: Secondary | ICD-10-CM | POA: Insufficient documentation

## 2016-03-20 DIAGNOSIS — Z8041 Family history of malignant neoplasm of ovary: Secondary | ICD-10-CM | POA: Diagnosis not present

## 2016-03-20 DIAGNOSIS — R0789 Other chest pain: Secondary | ICD-10-CM | POA: Diagnosis not present

## 2016-03-20 DIAGNOSIS — C342 Malignant neoplasm of middle lobe, bronchus or lung: Secondary | ICD-10-CM

## 2016-03-20 DIAGNOSIS — Z79811 Long term (current) use of aromatase inhibitors: Secondary | ICD-10-CM | POA: Diagnosis not present

## 2016-03-20 MED ORDER — METHYLPREDNISOLONE 4 MG PO TBPK: ORAL_TABLET | ORAL | 0 refills | Status: DC

## 2016-03-20 NOTE — Progress Notes (Signed)
  Radiation Oncology         [phone] ________________________________  Name: Summer Williams MRN: [id number]  Date: 03/20/2016  DOB: [birth year]  SIMULATION AND TREATMENT PLANNING NOTE    ICD-9-CM ICD-10-CM   1. 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  Identity was confirmed.  All relevant records and images related to the planned course of therapy were reviewed.  The patient freely provided informed written consent to proceed with treatment after reviewing the details related to the planned course of therapy. The consent form was witnessed and verified by the simulation staff.  Then, the patient was set-up in a stable reproducible  supine position for radiation therapy.  CT images were obtained.  Surface markings were placed.  The CT images were loaded into the planning software.  Then the target and avoidance structures were contoured.  Treatment planning then occurred.  The radiation prescription was entered and confirmed.  Then, I designed and supervised the construction of a total of 4 medically necessary complex treatment devices, including a BodyFix immobilization mold custom fitted to the patient along with 3 multileaf collimators conformally shaped radiation around the treatment target while shielding critical structures such as the heart and spinal cord maximally.  I have requested : 3D Simulation  I have requested a DVH of the following structures: Left lung, right lung, spinal cord, heart, esophagus, and target.  I have ordered:Nutrition Consult  SPECIAL TREATMENT PROCEDURE:  The planned course of therapy using radiation constitutes a special treatment procedure. Special care is required in the management of  this patient for the following reasons. This treatment constitutes a Special Treatment Procedure for the following reason: [ Retreatment in a previously radiated area requiring careful monitoring of increased risk of toxicity due to overlap of previous treatment..  The special nature of the planned course of radiotherapy will require increased physician supervision and oversight to ensure patient's safety with optimal treatment outcomes.  PLAN:  The patient will receive 30 Gy in 10 fractions.  ________________________________  Sheral Apley Tammi Klippel, M.D.  This document serves as a record of services personally performed by Tyler Pita, MD. It was created on his behalf by Arlyce Harman, a trained medical scribe. The creation of this record is based on the scribe's personal observations and the provider's statements to them. This document has been checked and approved by the attending provider.

## 2016-03-20 NOTE — Progress Notes (Signed)
Department of Radiation Oncology  Phone:  [phone] Fax:        [phone]   Name: Summer Williams MRN: [id number]  DOB: 03-10-1946  Date: 03/20/2016  Follow Up Visit Note  Diagnosis: Stage IB, T2a, N0, M0 NSCLC, adenocarcinoma of the right upper lobe.  Narrative: Summer Williams is a pleasant patient who was diagnosed in 2015 with a left stage IA breast cancer who received radiotherapy with Dr. Pablo Ledger. She has been followed since in surveillance and in 2016 was found to have a stage IB, T2a,N0M0 NSCLC, adenocarcinoma of the right upper lobe. She received radiotherapy in 27 fractions and has been followed since with surveillance scans. Her CT on 02/06/16 revealed a new right sided pleural based nodule. She was encouraged to have a PET scan performed. On 02/15/16 this was completed and revealed hypermetabolism in a 2 x 1.5 cm spiculated right middle lobe nodule and associated subpleural nodularity over the right chest wall, SUV of both is in the range of 3.5, and chest wall musculature had an SUV of 4.1. She also had a hypermetabolic lesion in the left parotid gland, she has met with Dr. Lindi Adie, and he is considering immunotherapy versus chemotherapy following radiotherapy. She comes to discuss the role for additional radiation to the chest wall.  On review of systems, the patient reports that she is doing well overall. Since her last visit however she developed increasing right-sided chest wall pain beneath the right breast, she describes a sense of pain that worsens with activity particularly with twisting, Marland Kitchen, or lifting. She has been using oxycodone for pain relief which has helped improve some of her symptoms. She denies any sternal chest pain, shortness of breath, cough, fevers, chills, night sweats, unintended weight changes. She  denies any bowel or bladder disturbances, and denies abdominal pain, nausea or vomiting. She denies any new musculoskeletal or joint aches or pains. A  complete review of systems is obtained and is otherwise negative.  Summary and Interval since last radiation: 14 months   12/13/2014-01/19/2015. The site and dose used included Right upper/middle lobe of lung, 2.6 Gy/frac for total of 70.2 Gy in 27 fractions. The beams and energy used included 3D-Conformal, 6 and 10 MV photons.   03/13/14-04/14/14: 42.5 GY to the left breast in 17 fractions  Past Medical History:  Past Medical History:  Diagnosis Date  . Breast cancer ([place]) 01/05/14   left breast  . COPD (chronic obstructive pulmonary disease) ([place])   . Depression   . Diverticulosis   . History of alcohol abuse    last 1980  . Hyperlipidemia   . Internal hemorrhoids   . Lung cancer ([place])   . Oxygen deficiency   . Radiation 03/23/14-04/19/14   Left breast  . Radiation 12/13/14-01/19/15   right upper/middle lobe of lung 70.2 Gy  . Requires supplemental oxygen    at night USES 2 LITERS   . Shortness of breath     WITH ACTIVITY  . Skin cancer    Squamous  . Vitamin D deficiency   . Wears glasses     Past Surgical History: Past Surgical History:  Procedure Laterality Date  . BREAST SURGERY  02/09/2014   Left luimpectomy  . CHOLECYSTECTOMY  2003  . COLONOSCOPY WITH PROPOFOL N/A 11/08/2014   Procedure: COLONOSCOPY WITH PROPOFOL;  Surgeon: Inda Castle, MD;  Location: [facility];  Service: Endoscopy;  Laterality: N/A;  . DENTAL SURGERY     implants  . EYE SURGERY Bilateral  2005   Cataract OD  . VIDEO BRONCHOSCOPY WITH ENDOBRONCHIAL NAVIGATION N/A 11/10/2014   Procedure: VIDEO BRONCHOSCOPY WITH ENDOBRONCHIAL NAVIGATION;  Surgeon: Melrose Nakayama, MD;  Location: [facility];  Service: Thoracic;  Laterality: N/A;    Social History:  Social History   Social History  . Marital status: Divorced    Spouse name: N/A  . Number of children: 1  . Years of education: N/A   Occupational History  . Retired Education officer, [employer]    Social History Main Topics  . Smoking status: Former  Smoker    Packs/day: 1.00    Years: 50.00    Types: Cigarettes    Quit date: 08/31/2005  . Smokeless tobacco: Never Used  . Alcohol use No     Comment: hx abuse-last 1980  . Drug use: No  . Sexual activity: Not on file   Other Topics Concern  . Not on file   Social History Narrative  . No narrative on file    Family History: Family History  Problem Relation Age of Onset  . Heart disease Father   . Lung cancer Maternal Grandfather   . Leukemia Paternal Grandfather   . Cholelithiasis Mother   . Ovarian cancer Maternal Aunt   . Breast cancer Maternal Aunt   . Colon cancer Neg Hx   . Colon polyps Neg Hx   . Esophageal cancer Neg Hx   . Kidney disease Neg Hx   . Gallbladder disease Neg Hx   . Stomach cancer Neg Hx   . Rectal cancer Neg Hx    Allergies: No Known Allergies   Medications: Current Outpatient Prescriptions on File Prior to Encounter  Medication Sig Dispense Refill  . anastrozole (ARIMIDEX) 1 MG tablet TAKE 1 TABLET (1 MG TOTAL) BY MOUTH DAILY. 90 tablet 3  . aspirin 325 MG tablet Take 325 mg by mouth daily.    Marland Kitchen atorvastatin (LIPITOR) 40 MG tablet TAKE 1 TABLET BY MOUTH EVERY DAY 30 tablet 5  . Cholecalciferol (VITAMIN D) 2000 UNITS CAPS Take 1 capsule by mouth daily.     . cholestyramine (QUESTRAN) 4 g packet Take 1 packet (4 g total) by mouth 2 (two) times daily. 180 each 3  . citalopram (CELEXA) 20 MG tablet Take 20 mg by mouth daily. Reported on 06/28/2015    . levalbuterol (XOPENEX) 0.63 MG/3ML nebulizer solution Take 3 mLs (0.63 mg total) by nebulization every 6 (six) hours as needed for wheezing or shortness of breath. 120 mL 12  . magnesium oxide (MAG-OX) 400 MG tablet Take 400 mg by mouth daily.    . Multiple Vitamins-Minerals (MULTIVITAMIN GUMMIES ADULT PO) Take 2 tablets by mouth daily.     Marland Kitchen OLANZapine (ZYPREXA) 7.5 MG tablet Take 7.5 mg by mouth daily.  3  . Omega-3 Fatty Acids (FISH OIL) 1200 MG CAPS Take 1 capsule by mouth daily.    Marland Kitchen  oxyCODONE-acetaminophen (PERCOCET/ROXICET) 5-325 MG tablet Take 1 tablet by mouth every 8 (eight) hours as needed for severe pain. 60 tablet 0  . Probiotic Product (PROBIOTIC DAILY PO) Take 2 tablets by mouth daily. Probiotic gummy    . saccharomyces boulardii (FLORASTOR) 250 MG capsule Take 250 mg by mouth 2 (two) times daily. Reported on 08/02/2015     No current facility-administered medications on file prior to encounter.     Physical Exam:   Vitals:   03/20/16 0800  BP: (!) 147/76  Pulse: 67  Resp: 16  Temp: 97.8 F (36.6 C)  TempSrc: Oral  SpO2: 96%  Weight: 174 lb 9.6 oz (79.2 kg)  Height: 5' 4"  (1.626 m)    In general this is a well appearing Caucasian female in no acute distress. she is alert and oriented x4 and appropriate throughout the examination. HEENT reveals that the patient is normocephalic, atraumatic. EOMs are intact. PERRLA. Skin is intact without any evidence of gross lesions. Cardiovascular exam reveals a regular rate and rhythm, no clicks rubs or murmurs are auscultated. Chest is clear to auscultation bilaterally. No rash is noted in the region of her pain, and the pain is beneath the inframammary fold on the right chest wall adjacent to a previous tattoo from prior treatment. Lymphatic assessment is performed and does not reveal any adenopathy in the cervical, supraclavicular, axillary, or inguinal chains. Abdomen has active bowel sounds in all quadrants and is intact. The abdomen is soft, non tender, non distended. Lower extremities are negative for pretibial pitting edema, deep calf tenderness, cyanosis or clubbing.    IMPRESSION/PLAN: 1. Progressive Stage IB, T2a, N0, M0 NSCLC, adenocarcinoma of the right upper lobe now involving the chest wall. Dr. Tammi Klippel has met with the patient and discussed the findings from her PET and has recommended proceeding with palliative radiotherapy to the chest wall along the 7th anterolateral rib. We discussed the risks, benefits,  short, and long term effects of radiotherapy, and the patient is interested in proceeding. Dr. Tammi Klippel discusses the delivery and logistics of radiotherapy and recommends 30 Gy in 10 fractions. She has signed written consent to proceed and will have simulation today. 2. Chest wall pain. The patient will continue to use oxycodone ain relief, we also discussed the use of a Solu-Medrol dose pack to help with her symptoms that could be related to myositis seen on her imaging as well. 3. 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 The patient completed adjuvant radiotherapy to the breast and remains NED and continues Arimidex under the care of Dr. Lindi Adie. We will continue to follow this expectantly.  In a visit lasting 30 minutes, greater than 50% of the time was spent face to face reviewing her imaging, and coordinating her care.    Carola Rhine, PAC

## 2016-03-20 NOTE — Telephone Encounter (Addendum)
Called in Script To [facility] on [address] for Ms[address] Toner for a Medrol Dosepak, per Shona Simpson, PA.  Medrol 4 mg TBPK : 6 tabs po on Day 1, Take 5 tabs po on day 2, Take 4 tabs po on day 3, Take 3 tabs po on day 4, Take 2 tabs po on day 5, and 1 Tab po on day 6, then stop.    Called and left a voice message to pick up the above script.

## 2016-03-23 DIAGNOSIS — J449 Chronic obstructive pulmonary disease, unspecified: Secondary | ICD-10-CM | POA: Diagnosis not present

## 2016-03-25 DIAGNOSIS — C7951 Secondary malignant neoplasm of bone: Secondary | ICD-10-CM | POA: Diagnosis not present

## 2016-03-25 DIAGNOSIS — Z510 Encounter for antineoplastic radiation therapy: Secondary | ICD-10-CM | POA: Diagnosis not present

## 2016-03-25 NOTE — Addendum Note (Signed)
Encounter addended by: Doreen Beam, RN on: 03/25/2016 12:37 PM<BR>    Actions taken: Charge Capture section accepted

## 2016-03-28 ENCOUNTER — Ambulatory Visit
Admission: RE | Admit: 2016-03-28 | Discharge: 2016-03-28 | Disposition: A | Discharge status: Home / Self Care | Payer: PPO | Source: Ambulatory Visit | Attending: Radiation Oncology | Admitting: Radiation Oncology

## 2016-03-28 DIAGNOSIS — C7951 Secondary malignant neoplasm of bone: Secondary | ICD-10-CM | POA: Diagnosis not present

## 2016-03-28 DIAGNOSIS — Z510 Encounter for antineoplastic radiation therapy: Secondary | ICD-10-CM | POA: Diagnosis not present

## 2016-03-31 ENCOUNTER — Ambulatory Visit
Admission: RE | Admit: 2016-03-31 | Discharge: 2016-03-31 | Disposition: A | Discharge status: Home / Self Care | Payer: PPO | Source: Ambulatory Visit | Attending: Radiation Oncology | Admitting: Radiation Oncology

## 2016-03-31 ENCOUNTER — Other Ambulatory Visit: Payer: Self-pay | Admitting: Radiation Oncology

## 2016-03-31 DIAGNOSIS — Z510 Encounter for antineoplastic radiation therapy: Secondary | ICD-10-CM | POA: Diagnosis not present

## 2016-03-31 DIAGNOSIS — C7951 Secondary malignant neoplasm of bone: Secondary | ICD-10-CM

## 2016-03-31 LAB — GUARDANT 360

## 2016-03-31 MED ORDER — OXYCODONE-ACETAMINOPHEN 5-325 MG PO TABS: 1.0000 | ORAL_TABLET | Freq: Four times a day (QID) | ORAL | 0 refills | Status: DC | PRN

## 2016-04-01 ENCOUNTER — Encounter: Payer: Self-pay | Admitting: Radiation Oncology

## 2016-04-01 ENCOUNTER — Ambulatory Visit
Admission: RE | Admit: 2016-04-01 | Discharge: 2016-04-01 | Disposition: A | Discharge status: Home / Self Care | Payer: PPO | Source: Ambulatory Visit | Attending: Radiation Oncology | Admitting: Radiation Oncology

## 2016-04-01 ENCOUNTER — Inpatient Hospital Stay
Admission: RE | Admit: 2016-04-01 | Discharge: 2016-04-01 | Disposition: A | Discharge status: Home / Self Care | Payer: Self-pay | Source: Ambulatory Visit | Attending: Radiation Oncology | Admitting: Radiation Oncology

## 2016-04-01 VITALS — BP 126/84 | HR 64 | Temp 98.1°F | Resp 20 | Wt 169.2 lb

## 2016-04-01 DIAGNOSIS — Z170 Estrogen receptor positive status [ER+]: Principal | ICD-10-CM

## 2016-04-01 DIAGNOSIS — C50312 Malignant neoplasm of lower-inner quadrant of left female breast: Secondary | ICD-10-CM

## 2016-04-01 DIAGNOSIS — C7951 Secondary malignant neoplasm of bone: Secondary | ICD-10-CM

## 2016-04-01 DIAGNOSIS — Z510 Encounter for antineoplastic radiation therapy: Secondary | ICD-10-CM | POA: Diagnosis not present

## 2016-04-01 NOTE — Progress Notes (Signed)
  Radiation Oncology         [phone]   Name: Summer Williams MRN: [id number]   Date: 04/01/2016  DOB: Oct 05, 1945   Weekly Radiation Therapy Management    ICD-9-CM ICD-10-CM   1. Malignant neoplasm of lower-inner quadrant of left breast in female, estrogen receptor positive (HCC) 174.3 C50.312    V86.0 Z17.0   2. Bone metastasis (HCC) 198.5 C79.51     Current Dose: 6 Gy  Planned Dose:  30 Gy  Narrative The patient presents for routine under treatment assessment.  no c/o pain , got pain rx yesterday,  No skin changes, appetite good, discussed skin irritation, fatigue.  The patient is without complaint. Set-up films were reviewed. The chart was checked.  Physical Findings  weight is 169 lb 3.2 oz (76.7 kg). Her oral temperature is 98.1 F (36.7 C). Her blood pressure is 126/84 and her pulse is 64. Her respiration is 20. . Weight essentially stable.  No significant changes.  Impression The patient is tolerating radiation.  Plan Continue treatment as planned.         Sheral Apley Tammi Klippel, M.D.

## 2016-04-01 NOTE — Progress Notes (Signed)
Weekly rad txs right ribs(7th,rib ) 2/10 completed, no c/o pain , got pain rx yesterday,  No skin changes, appetite good, discussed skin irritation, fatigue 2:38 PM BP 126/84 (BP Location: Left Arm, Patient Position: Sitting, Cuff Size: Normal)   Pulse 64   Temp 98.1 F (36.7 C) (Oral)   Resp 20   Wt 169 lb 3.2 oz (76.7 kg)   BMI 29.04 kg/m   Wt Readings from Last 3 Encounters:  04/01/16 169 lb 3.2 oz (76.7 kg)  03/20/16 174 lb 9.6 oz (79.2 kg)  03/17/16 172 lb 14.4 oz (78.4 kg)

## 2016-04-02 ENCOUNTER — Ambulatory Visit
Admission: RE | Admit: 2016-04-02 | Discharge: 2016-04-02 | Disposition: A | Discharge status: Home / Self Care | Payer: PPO | Source: Ambulatory Visit | Attending: Radiation Oncology | Admitting: Radiation Oncology

## 2016-04-02 DIAGNOSIS — Z510 Encounter for antineoplastic radiation therapy: Secondary | ICD-10-CM | POA: Diagnosis not present

## 2016-04-03 ENCOUNTER — Ambulatory Visit
Admission: RE | Admit: 2016-04-03 | Discharge: 2016-04-03 | Disposition: A | Discharge status: Home / Self Care | Payer: PPO | Source: Ambulatory Visit | Attending: Radiation Oncology | Admitting: Radiation Oncology

## 2016-04-03 DIAGNOSIS — Z510 Encounter for antineoplastic radiation therapy: Secondary | ICD-10-CM | POA: Diagnosis not present

## 2016-04-04 ENCOUNTER — Ambulatory Visit
Admission: RE | Admit: 2016-04-04 | Discharge: 2016-04-04 | Disposition: A | Discharge status: Home / Self Care | Payer: PPO | Source: Ambulatory Visit | Attending: Radiation Oncology | Admitting: Radiation Oncology

## 2016-04-04 DIAGNOSIS — C7951 Secondary malignant neoplasm of bone: Secondary | ICD-10-CM | POA: Diagnosis not present

## 2016-04-04 DIAGNOSIS — Z510 Encounter for antineoplastic radiation therapy: Secondary | ICD-10-CM | POA: Diagnosis not present

## 2016-04-07 ENCOUNTER — Ambulatory Visit: Payer: PPO

## 2016-04-08 ENCOUNTER — Ambulatory Visit
Admission: RE | Admit: 2016-04-08 | Discharge: 2016-04-08 | Disposition: A | Discharge status: Home / Self Care | Payer: PPO | Source: Ambulatory Visit | Attending: Radiation Oncology | Admitting: Radiation Oncology

## 2016-04-08 DIAGNOSIS — Z510 Encounter for antineoplastic radiation therapy: Secondary | ICD-10-CM | POA: Diagnosis not present

## 2016-04-09 ENCOUNTER — Ambulatory Visit
Admission: RE | Admit: 2016-04-09 | Discharge: 2016-04-09 | Disposition: A | Discharge status: Home / Self Care | Payer: PPO | Source: Ambulatory Visit | Attending: Radiation Oncology | Admitting: Radiation Oncology

## 2016-04-09 DIAGNOSIS — Z510 Encounter for antineoplastic radiation therapy: Secondary | ICD-10-CM | POA: Diagnosis not present

## 2016-04-10 ENCOUNTER — Ambulatory Visit
Admission: RE | Admit: 2016-04-10 | Discharge: 2016-04-10 | Disposition: A | Discharge status: Home / Self Care | Payer: PPO | Source: Ambulatory Visit | Attending: Radiation Oncology | Admitting: Radiation Oncology

## 2016-04-10 DIAGNOSIS — Z510 Encounter for antineoplastic radiation therapy: Secondary | ICD-10-CM | POA: Diagnosis not present

## 2016-04-11 ENCOUNTER — Ambulatory Visit
Admission: RE | Admit: 2016-04-11 | Discharge: 2016-04-11 | Disposition: A | Discharge status: Home / Self Care | Payer: PPO | Source: Ambulatory Visit | Attending: Radiation Oncology | Admitting: Radiation Oncology

## 2016-04-11 ENCOUNTER — Encounter: Payer: Self-pay | Admitting: Radiation Oncology

## 2016-04-11 ENCOUNTER — Ambulatory Visit: Payer: PPO

## 2016-04-11 VITALS — BP 121/73 | HR 113 | Temp 98.1°F | Resp 20 | Wt 167.6 lb

## 2016-04-11 DIAGNOSIS — C50312 Malignant neoplasm of lower-inner quadrant of left female breast: Secondary | ICD-10-CM

## 2016-04-11 DIAGNOSIS — Z510 Encounter for antineoplastic radiation therapy: Secondary | ICD-10-CM | POA: Diagnosis not present

## 2016-04-11 DIAGNOSIS — Z170 Estrogen receptor positive status [ER+]: Principal | ICD-10-CM

## 2016-04-11 NOTE — Progress Notes (Signed)
  Radiation Oncology         [phone]   Name: Summer Williams MRN: [id number]   Date: 04/11/2016  DOB: Mar 20, 1946   Weekly Radiation Therapy Management    ICD-9-CM ICD-10-CM   1. Malignant neoplasm of lower-inner quadrant of left breast in female, estrogen receptor positive (HCC) 174.3 C50.312    V86.0 Z17.0     Current Dose: 27 Gy  Planned Dose:  30 Gy  Narrative The patient presents for routine under treatment assessment.  Weekly rad txs right ribs 9/10 completed. Reports slight sob, easily fatigued. Denies pain, but has occasional twinges of pain in the treatment area. Appetite good.  Set-up films were reviewed. The chart was checked.  Physical Findings  weight is 167 lb 9.6 oz (76 kg). Her oral temperature is 98.1 F (36.7 C). Her blood pressure is 121/73 and her pulse is 113 (abnormal). Her respiration is 20. . Weight essentially stable.  No significant changes.  Impression The patient is tolerating radiation.  Plan Continue treatment as planned. The patient is scheduled to follow up on 05/27/16. She is scheduled to follow up with Dr. Lindi Adie on 04/28/16.         Sheral Apley Tammi Klippel, M.D.  This document serves as a record of services personally performed by Tyler Pita, MD. It was created on his behalf by Darcus Austin, a trained medical scribe. The creation of this record is based on the scribe's personal observations and the provider's statements to them. This document has been checked and approved by the attending provider.

## 2016-04-11 NOTE — Progress Notes (Signed)
Weekly rad txs right ribs 9/10 completed, vitas stable, P=113 though, slight sob, gets titred easily, no c/o pain,  Appetite good, gave 1 month f/u appt with Shona Simpson, PA 05/27/16 BP 121/73 (BP Location: Left Arm, Patient Position: Sitting)   Pulse (!) 113   Temp 98.1 F (36.7 C) (Oral)   Resp 20   Wt 167 lb 9.6 oz (76 kg)   BMI 28.77 kg/m   Wt Readings from Last 3 Encounters:  04/11/16 167 lb 9.6 oz (76 kg)  04/01/16 169 lb 3.2 oz (76.7 kg)  03/20/16 174 lb 9.6 oz (79.2 kg)

## 2016-04-14 ENCOUNTER — Ambulatory Visit
Admission: RE | Admit: 2016-04-14 | Discharge: 2016-04-14 | Disposition: A | Discharge status: Home / Self Care | Payer: PPO | Source: Ambulatory Visit | Attending: Radiation Oncology | Admitting: Radiation Oncology

## 2016-04-14 ENCOUNTER — Ambulatory Visit: Payer: PPO

## 2016-04-14 ENCOUNTER — Encounter: Payer: Self-pay | Admitting: Radiation Oncology

## 2016-04-14 DIAGNOSIS — C7951 Secondary malignant neoplasm of bone: Secondary | ICD-10-CM | POA: Diagnosis not present

## 2016-04-14 DIAGNOSIS — Z510 Encounter for antineoplastic radiation therapy: Secondary | ICD-10-CM | POA: Diagnosis not present

## 2016-04-15 ENCOUNTER — Ambulatory Visit: Payer: PPO

## 2016-04-16 ENCOUNTER — Ambulatory Visit: Payer: PPO

## 2016-04-17 ENCOUNTER — Ambulatory Visit: Payer: PPO

## 2016-04-18 ENCOUNTER — Ambulatory Visit: Admission: RE | Admit: 2016-04-18 | Payer: PPO | Source: Ambulatory Visit

## 2016-04-20 NOTE — Progress Notes (Signed)
  Radiation Oncology         [phone]) [phone] ________________________________  Name: Summer Williams MRN: [id number]  Date: 04/14/2016  DOB: 11-13-1945  End of Treatment Note  Diagnosis:   70 y.o. woman with stage IB (T2, N0, M0) adenocarcinoma of the right upper lobe with right rib bone metastasis  Indication for treatment:  Palliative  Radiation treatment dates: 03/31/16 - 04/14/16  Site/dose:  Right ribs: 30 Gy in 10 fractions.  Beams/energy:  3D // 6X Photon  Narrative: The patient tolerated radiation treatment relatively well. She reported slight SOB, fatigue, and a good appetite. Her only complaint was occasional twinges of pain in the treatment area.  Plan: The patient has completed radiation treatment. The patient will return to radiation oncology clinic for routine followup in one month. I advised her to call or return sooner if she has any questions or concerns related to her recovery or treatment. ________________________________  Sheral Apley. Tammi Klippel, M.D.  This document serves as a record of services personally performed by Tyler Pita, MD. It was created on his behalf by Darcus Austin, a trained medical scribe. The creation of this record is based on the scribe's personal observations and the provider's statements to them. This document has been checked and approved by the attending provider.

## 2016-04-23 DIAGNOSIS — J449 Chronic obstructive pulmonary disease, unspecified: Secondary | ICD-10-CM | POA: Diagnosis not present

## 2016-04-28 ENCOUNTER — Encounter: Payer: Self-pay | Admitting: Hematology and Oncology

## 2016-04-28 ENCOUNTER — Ambulatory Visit (HOSPITAL_BASED_OUTPATIENT_CLINIC_OR_DEPARTMENT_OTHER): Payer: PPO | Admitting: Hematology and Oncology

## 2016-04-28 ENCOUNTER — Telehealth: Payer: Self-pay | Admitting: Hematology and Oncology

## 2016-04-28 VITALS — BP 111/80 | HR 91 | Temp 97.7°F | Resp 18 | Ht 64.0 in | Wt 167.8 lb

## 2016-04-28 DIAGNOSIS — C7951 Secondary malignant neoplasm of bone: Secondary | ICD-10-CM

## 2016-04-28 DIAGNOSIS — R0781 Pleurodynia: Secondary | ICD-10-CM

## 2016-04-28 DIAGNOSIS — C50512 Malignant neoplasm of lower-outer quadrant of left female breast: Secondary | ICD-10-CM | POA: Diagnosis not present

## 2016-04-28 DIAGNOSIS — C342 Malignant neoplasm of middle lobe, bronchus or lung: Secondary | ICD-10-CM

## 2016-04-28 DIAGNOSIS — Z79811 Long term (current) use of aromatase inhibitors: Secondary | ICD-10-CM

## 2016-04-28 MED ORDER — ASPIRIN 81 MG PO TABS
81.0000 mg | ORAL_TABLET | Freq: Every day | ORAL | Status: DC
Start: 2016-04-28 — End: 2018-10-10

## 2016-04-28 MED ORDER — ZOLPIDEM TARTRATE 5 MG PO TABS: 5.0000 mg | ORAL_TABLET | Freq: Every evening | ORAL | 3 refills | Status: DC | PRN

## 2016-04-28 NOTE — Telephone Encounter (Signed)
Gave patient avs report and appointments for March  °

## 2016-04-28 NOTE — Assessment & Plan Note (Signed)
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 Hypermetabolic subpleural nodularity along the lateral right chest wall, suspicious for pleural-based metastasis. 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 There is not enough biopsy material for molecular testing. [facility] 360 is negative for EGFR mutation blood test. 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 However it is unclear if these mutations are the driver mutations. We do not have any data for their use and lung cancer.  2. with evidence of metastatic disease into the ribs in Chest wall, we could consider palliative chemotherapy. I discussed the role of single agent carboplatin given every 3 weeks to assess tolerability and she does tolerate it consider adding Taxol.   Right rib pain: Completed palliative radiation 04/14/2016 Bone metastases: Patient will need Xgeva every 3 months along with calcium and vitamin D  Return to clinic in one month to discuss it with the patient wants to go on chemotherapy or would like to pursue hospice care are watchful waiting until she becomes symptomatic.

## 2016-04-28 NOTE — Progress Notes (Signed)
Patient Care Team: Elby Showers, MD as PCP - General (Internal Medicine) Nicholas Lose, MD as Consulting Physician (Hematology and Oncology) Erroll Luna, MD as Consulting Physician (General Surgery) Thea Silversmith, MD as Consulting Physician (Radiation Oncology)  DIAGNOSIS:  Encounter Diagnoses  Name Primary?  . 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 Bone metastasis ([place])     SUMMARY OF ONCOLOGIC HISTORY:   Cancer of lower-inner quadrant of left female breast ([place])   01/05/2014 Initial Diagnosis    Ultrasound-guided core needle biopsy: Cancer of lower-inner quadrant of left female breast, ER 100%, PR 90%, Ki-67: 30%, HER-2 negative (Ratio 1.02), grade 2      01/13/2014 Breast MRI    1.6 cm left breast abnormality, no abnormal lymph nodes.      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 ([place])   11/10/2014 Initial Diagnosis    Right lung biopsy adenocarcinoma TTF-1 strong diffuse expression, CK 7 strong diffuse expression GCDFP negative; estrogen receptor weak      12/13/2014 - 01/19/2015 Radiation Therapy    Right upper/middle lobe of lung, 2.6 Gy/frac for total of 70.2 Gy (definitive radiation)      03/12/2015 Imaging    Right middle and upper lobe spiculated groundglass mass measures 2.7 x 2.23.3 cm previously was 3.4 x 2.44.2 cm, 3 mm right middle lobe nodule, chronic left lower lobe nodule 3 mm      07/31/2015  Imaging    CT chest :similar to slight decrease in the sub-solitary pulmonary nodule (2.3 x 1.7 cm was previously 2.7 x 2.2 cm) in the right upper and middle lobes, treatment-related changes, unchanged pulmonary nodules in right and left lungs      02/15/2016 PET scan    2.0 x 1.5 cm spiculated RML nodule. Hypermetabolic subpleural nodularity along the lateral right chest wall, suspicious for pleural-based metastasis. Associated hypermetabolism of the overlying chest wall musculature Suspected bone met 7th rib      03/31/2016 - 04/14/2016 Radiation Therapy    Palliative radiation to rib       CHIEF COMPLIANT: Follow-up after palliative radiation.  INTERVAL HISTORY: Summer Williams is a 70 year old with above-mentioned history of metastatic lung cancer who completed palliative radiation therapy to the rib and is here for evaluation. She reports that the pain has resolved. She is feeling a lot better. She does have fatigue. She also has chronic shortness of breath. She thought about chemotherapy options and decided not to pursue chemotherapy. She felt that given her frail COPD status, she may be better off not receiving chemotherapy.  REVIEW OF SYSTEMS:   Constitutional: Denies fevers, chills or abnormal weight loss Eyes: Denies blurriness of vision Ears, nose, mouth, throat, and face: Denies mucositis or sore throat Respiratory: Chronic shortness of breath Cardiovascular: Denies palpitation, chest discomfort Gastrointestinal:  Denies nausea, heartburn or change in bowel habits Skin: Denies abnormal skin rashes Lymphatics: Denies new lymphadenopathy or easy bruising Neurological:Denies numbness, tingling or new weaknesses Behavioral/Psych: Mood is stable, no new changes  Extremities: No lower extremity edema Breast:  denies any pain or lumps or nodules in either breasts All other systems were reviewed with the patient and are negative.  I have reviewed the past medical history, past  surgical history, social history and family history with the patient and they are unchanged from previous note.  ALLERGIES:  has No Known Allergies.  MEDICATIONS:  Current Outpatient Prescriptions  Medication Sig Dispense Refill  . anastrozole (ARIMIDEX) 1 MG tablet TAKE 1 TABLET (1 MG TOTAL) BY MOUTH DAILY. 90 tablet 3  . aspirin 325 MG tablet Take 325 mg by mouth daily.    Marland Kitchen atorvastatin (LIPITOR) 40 MG tablet TAKE 1 TABLET BY MOUTH EVERY DAY 30 tablet 5  . Cholecalciferol (VITAMIN D) 2000 UNITS CAPS Take 1 capsule by mouth daily.     . cholestyramine (QUESTRAN) 4 g packet Take 1 packet (4 g total) by mouth 2 (two) times daily. 180 each 3  . citalopram (CELEXA) 20 MG tablet Take 20 mg by mouth daily. Reported on 06/28/2015    . ibuprofen (ADVIL,MOTRIN) 200 MG tablet Take 400 mg by mouth 3 (three) times daily.    Marland Kitchen levalbuterol (XOPENEX) 0.63 MG/3ML nebulizer solution Take 3 mLs (0.63 mg total) by nebulization every 6 (six) hours as needed for wheezing or shortness of breath. 120 mL 12  . magnesium oxide (MAG-OX) 400 MG tablet Take 400 mg by mouth daily.    . methylPREDNISolone (MEDROL DOSEPAK) 4 MG TBPK tablet Take 6 tabs po on day 1, take 5 tabs po on day 2, take 4 tabs po on day 3, take 3 tabs po on day 4, take 2 tabs po on day 5, and 1 tab po on day 6 then stop 21 tablet 0  . Multiple Vitamins-Minerals (MULTIVITAMIN GUMMIES ADULT PO) Take 2 tablets by mouth daily.     Marland Kitchen OLANZapine (ZYPREXA) 7.5 MG tablet Take 7.5 mg by mouth daily.  3  . Omega-3 Fatty Acids (FISH OIL) 1200 MG CAPS Take 1 capsule by mouth daily.    Marland Kitchen oxyCODONE-acetaminophen (PERCOCET/ROXICET) 5-325 MG tablet Take 1-2 tablets by mouth every 6 (six) hours as needed for severe pain. 100 tablet 0  . Probiotic Product (PROBIOTIC DAILY PO) Take 2 tablets by mouth daily. Probiotic gummy    . saccharomyces boulardii (FLORASTOR) 250 MG capsule Take 250 mg by mouth 2 (two) times daily. Reported on 08/02/2015     No current  facility-administered medications for this visit.     PHYSICAL EXAMINATION: ECOG PERFORMANCE STATUS: 2 - Symptomatic, <50% confined to bed  Vitals:   04/28/16 1429  BP: 111/80  Pulse: 91  Resp: 18  Temp: 97.7 F (36.5 C)   Filed Weights   04/28/16 1429  Weight: 167 lb 12.8 oz (76.1 kg)    GENERAL:alert, no distress and comfortable SKIN: skin color, texture, turgor are normal, no rashes or significant lesions EYES: normal, Conjunctiva are pink and non-injected, sclera clear OROPHARYNX:no exudate, no erythema and lips, buccal mucosa, and tongue normal  NECK: supple, thyroid normal size, non-tender, without nodularity LYMPH:  no palpable lymphadenopathy in the cervical, axillary or inguinal LUNGS: Diminished breath sounds bilaterally from COPD HEART: regular rate & rhythm and no murmurs and no lower extremity edema ABDOMEN:abdomen soft, non-tender and normal bowel sounds MUSCULOSKELETAL:no cyanosis  of digits and no clubbing  NEURO: alert & oriented x 3 with fluent speech, no focal motor/sensory deficits EXTREMITIES: No lower extremity edema  LABORATORY DATA:  I have reviewed the data as listed   Chemistry      Component Value Date/Time   NA 143 03/17/2016 1402   K 4.3 03/17/2016 1402   CL 106 02/01/2016 0956   CO2 26 03/17/2016 1402   BUN 10.8 03/17/2016 1402   CREATININE 0.9 03/17/2016 1402      Component Value Date/Time   CALCIUM 9.7 03/17/2016 1402   ALKPHOS 96 03/17/2016 1402   AST 21 03/17/2016 1402   ALT 8 03/17/2016 1402   BILITOT 0.70 03/17/2016 1402       Lab Results  Component Value Date   WBC 5.3 03/17/2016   HGB 12.3 03/17/2016   HCT 37.3 03/17/2016   MCV 91.4 03/17/2016   PLT 85 (L) 03/17/2016   NEUTROABS 2.9 03/17/2016    ASSESSMENT & PLAN:  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 Hypermetabolic subpleural nodularity along the lateral right chest wall, suspicious for pleural-based metastasis. 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 There is not enough biopsy material for molecular testing. [facility] 360 is negative for EGFR mutation blood test. 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 However it is unclear if these mutations are the driver mutations. We do not have any data for their use and lung cancer.  2. with evidence of metastatic disease into the ribs in Chest wall, I discussed the role of single agent carboplatin given every 3 weeks to assess tolerability and she does tolerate it consider adding Taxol. After much discussion, patient decided to not received any further chemotherapy. She would like to be treated with a palliative intent. If her symptoms get much worse then she will enroll in hospice care. At this point there is no urgency to get hospice involved. [place] gets worsening symptoms.  Right rib pain: Completed palliative radiation 04/14/2016 with excellent improvement in pain Bone metastases: Patient will need Xgeva every 3 months along with calcium and vitamin D  Return to clinic in 4 months for follow-up or sooner if she develops worsening  symptoms.  No orders of the defined types were placed in this encounter.  The patient has a good understanding of the overall plan. she agrees with it. she will call with any problems that may develop before the next visit here.   GRulon Eisenmenger MD 04/28/16

## 2016-04-30 ENCOUNTER — Encounter: Payer: Self-pay | Admitting: Hematology and Oncology

## 2016-04-30 NOTE — Progress Notes (Signed)
Submitted PA for Zolpidem Tartate through Cover My Meds.

## 2016-05-02 ENCOUNTER — Encounter: Payer: Self-pay | Admitting: Hematology and Oncology

## 2016-05-02 NOTE — Progress Notes (Signed)
Called [facility]([place]) to advise of PA for Zolpidem tartrate.

## 2016-05-02 NOTE — Progress Notes (Signed)
Called [facility] to follow up on PA for Zolopidem Tartrate sent via Cover My Meds on 04/30/16. Summer Williams states they haven't received request yet. Completed over the phone with her. She states decision will be faxed within 24 hours.

## 2016-05-02 NOTE — Progress Notes (Signed)
Received approval from [facility] for Zolpidem tartrate.   Zolpidem Tartrate '5mg'$  tablet Approved 05/02/16-06/01/17 [place] [phone]

## 2016-05-04 ENCOUNTER — Telehealth: Payer: Self-pay | Admitting: Internal Medicine

## 2016-05-05 ENCOUNTER — Encounter: Payer: Self-pay | Admitting: Internal Medicine

## 2016-05-05 ENCOUNTER — Ambulatory Visit (INDEPENDENT_AMBULATORY_CARE_PROVIDER_SITE_OTHER): Payer: PPO | Admitting: Internal Medicine

## 2016-05-05 DIAGNOSIS — J449 Chronic obstructive pulmonary disease, unspecified: Secondary | ICD-10-CM

## 2016-05-05 MED ORDER — LEVALBUTEROL HCL 0.63 MG/3ML IN NEBU: 0.6300 mg | INHALATION_SOLUTION | Freq: Four times a day (QID) | RESPIRATORY_TRACT | 12 refills | Status: DC | PRN

## 2016-05-05 NOTE — Patient Instructions (Addendum)
Order-- Walk test on room air   Dx  COPD mixed type  Xopenex nebulizer solution refill script sent

## 2016-05-05 NOTE — Progress Notes (Signed)
HPI HPI female former smoker followed for COPD/chronic bronchitis severe, [place] Ca/ lung nodules, complicated by history left breast cancer/XRT, history schizoaffective disorder a1AT normal 158 no phenotype 6 minute walk test 08/30/2012-92%, 87%, 94%, 297 m. Significant desaturation with exercise. PFT: 08/30/2012- severe obstructive airways disease with response to bronchodilator, air trapping, diffusion severely reduced. FVC 1.96/71%, FEV1 0.89/45%, FEV1/FVC 0.45. RV 134%, DLCO 47%. PFT: 11/02/2014-severe obstructive airways disease. FEV1 0.90/39% with significant response to bronchodilator. Walk Test room air 05/05/16- desaturation to 87%- declines portable oxygen .   07/04/2015-69 year old female former smoker followed for COPD/chronic bronchitis severe, complicated by history left breast cancer/XRT, history schizoaffective disorder O2 2l/ sleep/APS Continues oncology follow-up for breast cancer Radiation oncology following for lung nodules. CT chest 03/12/2015 showed decreased size of right middle and right upper lobe nodules AdenoCa lung after XRT FOLLOWS FOR: Pt notes increased SOB - feels as though her COPD is worsening. Denies wheezing, chest pressure/pain. Pt c/o mucus production, chest congestion with cough, stuffy nose. Using Mucinex, DayQuil/NyQuil prn. Discuss flu vaccine.  Little cough on my discussion with her. Thinks she has an incidental cold now. Usually not much sputum. Home nebulizer helps some used only occasionally. Walk test on room air 07/04/2015-very transient dip to 88% but otherwise not in range to qualify for exercise O2. We will watch this.  05/05/2016-70 year old female former smoker followed for COPD/chronic bronchitis severe, [place] Ca/ lung nodules, complicated by history left breast cancer/XRT, history schizoaffective disorder O2 2 L sleep/APS FOLLOW FOR: yearly check-up sob,wheezing getting worse.Has lung cancer, had raditation twice once in the lung, once in   the, rib. Dr,Manning said she doing well. Pt.wasn't sure if you knew she has lung cancer. Finished XRT for adenocarcinoma right lung. She chose not to have chemotherapy. Previously diagnosed left breast cancer treated with lumpectomy/XRT and hormones. Easier DOE with little wheeze or cough. Breathing does not wake her. Continues oxygen 2 L for sleep. , recovered with rest and oxygen. She declines portable oxygen at this time. She says several different inhalers, including Anoro, have made no difference that she could tell. CT chest 02/06/2016 IMPRESSION: Progressive nodular soft tissue in the right middle and lower lobe, with increased solid component when compared to the prior study, suggesting recurrent tumor. Additional new subpleural soft tissue along the lateral right lung. Consider PET-CT for further evaluation as clinically warranted. Scattered bilateral pulmonary nodules measuring up to 6 mm, relatively unchanged since 2016, favoring a benign etiology. Small mediastinal lymph nodes measuring up to 8 mm short axis, favored to be reactive. PET scan 02/15/16-  Degenerative changes involving the visualized thoracolumbar spine. IMPRESSION: 2.0 x 1.5 cm spiculated right middle lobe nodule, corresponding to known primary bronchogenic neoplasm. Hypermetabolic subpleural nodularity along the lateral right chest wall, suspicious for pleural-based metastasis. Associated hypermetabolism of the overlying chest wall musculature is worrisome for metastatic involvement. Suspected osseous metastasis involving the right anterolateral 7th rib. 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 Additional ancillary findings as above. Electronically Signed   By: Julian Hy M.D.   On: 02/15/2016 12:59  ROS-see HPI Constitutional:   No-   weight loss, night sweats, fevers, chills, fatigue, lassitude. HEENT:    No-  headaches, difficulty swallowing, tooth/dental problems, sore throat,       No-  sneezing, itching, ear ache, nasal congestion, post nasal drip,  CV:  No-   chest pain, orthopnea, PND, swelling in lower extremities, anasarca, dizziness, palpitations  Resp: +  shortness of breath with exertion or at rest.                productive cough,  + non-productive cough,  No- coughing up of blood.                 change in color of mucus.  No- wheezing.   Skin: No-   rash or lesions. GI:  No-   heartburn, indigestion, abdominal pain, nausea, vomiting,  GU:  MS:  No-   joint pain or swelling.   Neuro-     nothing unusual Psych:  No- change in mood or affect. No depression or anxiety.  No memory loss.  OBJ- Physical Exam General- Alert, Oriented, Affect-appropriate, Distress- none acute, overweight Skin- rash-none, lesions- none, excoriation- none Lymphadenopathy- none Head- atraumatic            Eyes- Gross vision intact, PERRLA, conjunctivae and secretions clear            Ears- Hearing, canals-normal            Nose- Clear, no-Septal dev, mucus, polyps, erosion, perforation             Throat- Mallampati II , mucosa clear , drainage- none, tonsils- atrophic,  Neck- flexible , trachea midline, no stridor , thyroid nl, carotid no bruit Chest - symmetrical excursion , unlabored           Heart/CV- RRR , no murmur , no gallop  , no rub, nl s1 s2                           - JVD- none , edema- none, stasis changes- none, varices- none           Lung- +diminished, clear, unlabored, wheeze- none, No- cough , dullness-none, rub- none           Chest wall-  Abd-  Br/ Gen/ Rectal- Not done, not indicated Extrem- cyanosis- none, clubbing, none, atrophy- none, strength- nl Neuro- grossly intact to observation

## 2016-05-07 NOTE — Telephone Encounter (Signed)
Patient stated she have a prescription for albuterol. Patient stated it will cost $35.11 for 12 days. The pharmacy stated she could use an alternate. Patient feels the cost is too much for her.

## 2016-05-07 NOTE — Telephone Encounter (Signed)
CY Please advise on Rx. Thanks.

## 2016-05-07 NOTE — Assessment & Plan Note (Signed)
She is developing chronic hypoxic respiratory failure secondary to COPD and to her breast and lung cancers with radiation. She does not want portable oxygen now and will continue to pace herself. She does sleep with oxygen. She is not wheezing, does not notice wheezing, and has not recognized any benefit from a variety of bronchodilator medications. We will follow conservatively based on that.

## 2016-05-08 NOTE — Telephone Encounter (Signed)
lmomtcb x 3  

## 2016-05-08 NOTE — Telephone Encounter (Signed)
Patient is returning call.  °

## 2016-05-08 NOTE — Telephone Encounter (Signed)
lmomtcb x1 

## 2016-05-08 NOTE — Telephone Encounter (Signed)
Pt returning call.Summer Williams ° °

## 2016-05-08 NOTE — Telephone Encounter (Signed)
Is she saying Xopenex inhaler would cost too much and she wants to go back to albuterol HFA inhaler ?

## 2016-05-09 ENCOUNTER — Other Ambulatory Visit: Payer: Self-pay

## 2016-05-09 MED ORDER — ALBUTEROL SULFATE (2.5 MG/3ML) 0.083% IN NEBU: 2.5000 mg | INHALATION_SOLUTION | Freq: Four times a day (QID) | RESPIRATORY_TRACT | 12 refills | Status: AC | PRN

## 2016-05-09 NOTE — Telephone Encounter (Signed)
CY  Please Advise  Spoke with pt. She stated that the xopenex for her nebulizer is expensive. Was told to the pt. To see if you wanted to order albuterol or duoneb or ipratropium instead.

## 2016-05-09 NOTE — Telephone Encounter (Signed)
Ok to [employer] order for xopenex neb solution  Ok to replace with albuterol neb solution- same number of vials and instructions as for xopenex, refill x 1 year

## 2016-05-09 NOTE — Telephone Encounter (Signed)
[phone] pt calling back

## 2016-05-09 NOTE — Telephone Encounter (Signed)
Spoke with pt. And made her aware of the results of CY recc. Rx was sent to the pharmacy. Nothing further is needed at this time.

## 2016-05-22 DIAGNOSIS — H04123 Dry eye syndrome of bilateral lacrimal glands: Secondary | ICD-10-CM | POA: Diagnosis not present

## 2016-05-22 DIAGNOSIS — Z961 Presence of intraocular lens: Secondary | ICD-10-CM | POA: Diagnosis not present

## 2016-05-22 DIAGNOSIS — H524 Presbyopia: Secondary | ICD-10-CM | POA: Diagnosis not present

## 2016-05-22 DIAGNOSIS — H16103 Unspecified superficial keratitis, bilateral: Secondary | ICD-10-CM | POA: Diagnosis not present

## 2016-05-23 DIAGNOSIS — J449 Chronic obstructive pulmonary disease, unspecified: Secondary | ICD-10-CM | POA: Diagnosis not present

## 2016-05-27 ENCOUNTER — Ambulatory Visit
Admission: RE | Admit: 2016-05-27 | Discharge: 2016-05-27 | Disposition: A | Discharge status: Home / Self Care | Payer: PPO | Source: Ambulatory Visit | Attending: Radiation Oncology | Admitting: Radiation Oncology

## 2016-05-27 VITALS — BP 124/73 | HR 82 | Temp 98.6°F | Resp 18 | Wt 171.2 lb

## 2016-05-27 DIAGNOSIS — G4700 Insomnia, unspecified: Secondary | ICD-10-CM | POA: Diagnosis not present

## 2016-05-27 DIAGNOSIS — C50919 Malignant neoplasm of unspecified site of unspecified female breast: Secondary | ICD-10-CM | POA: Insufficient documentation

## 2016-05-27 DIAGNOSIS — C7951 Secondary malignant neoplasm of bone: Secondary | ICD-10-CM | POA: Diagnosis not present

## 2016-05-27 DIAGNOSIS — Z170 Estrogen receptor positive status [ER+]: Secondary | ICD-10-CM | POA: Diagnosis not present

## 2016-05-27 DIAGNOSIS — C3411 Malignant neoplasm of upper lobe, right bronchus or lung: Secondary | ICD-10-CM | POA: Insufficient documentation

## 2016-05-27 DIAGNOSIS — G2581 Restless legs syndrome: Secondary | ICD-10-CM | POA: Diagnosis not present

## 2016-05-27 DIAGNOSIS — C50312 Malignant neoplasm of lower-inner quadrant of left female breast: Secondary | ICD-10-CM

## 2016-05-27 DIAGNOSIS — C342 Malignant neoplasm of middle lobe, bronchus or lung: Secondary | ICD-10-CM

## 2016-05-27 DIAGNOSIS — F419 Anxiety disorder, unspecified: Secondary | ICD-10-CM | POA: Diagnosis not present

## 2016-05-27 MED ORDER — LORAZEPAM 0.5 MG PO TABS: ORAL_TABLET | ORAL | 0 refills | Status: DC

## 2016-05-27 MED ORDER — OXYCODONE-ACETAMINOPHEN 5-325 MG PO TABS: 1.0000 | ORAL_TABLET | Freq: Every day | ORAL | 0 refills | Status: DC

## 2016-05-27 NOTE — Progress Notes (Signed)
Weight and vitals stable. Denies pain. Reports right rib pain has completely resolved. However, patient is requesting refill of percocet. Patient reports percocet is the only medication that helps her sleep. Reports restless leg keeps her awake at night. Reports she has tried [place] and other sleep aids without relief. Reports SOB exertion continues. Reports Dr. Baird Lyons offered continuous oxygen therapy but, she wants to continue to pace herself and just wear 2.5 liters of oxygen at night. Denies cough, pain/difficulty swallowing, or hemoptysis. Denies wheezing. Reports fatigue.   BP 124/73 (BP Location: Left Arm, Patient Position: Sitting, Cuff Size: Normal)   Pulse 82   Temp 98.6 F (37 C) (Oral)   Resp 18   Wt 171 lb 3.2 oz (77.7 kg)   SpO2 95%   BMI 28.49 kg/m  Wt Readings from Last 3 Encounters:  05/27/16 171 lb 3.2 oz (77.7 kg)  05/05/16 169 lb 6.4 oz (76.8 kg)  04/28/16 167 lb 12.8 oz (76.1 kg)

## 2016-05-27 NOTE — Progress Notes (Signed)
Radiation Oncology         [phone] ________________________________  Name: Summer Williams MRN: [id number]  Date: 05/27/2016  DOB: Aug 03, 1945  Post Treatment Note  CC: Elby Showers, MD  Erroll Luna, MD  Diagnosis:   70 y.o. 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  Interval Since Last Radiation:  6 weeks   03/31/16 - 04/14/16: Right ribs: 30 Gy in 10 fractions.  Narrative:  The patient returns today for routine follow-up. Since completing radiotherapy after recurrence progressive disease to the  right lung and rib. She is going to be followed in surveillance per Dr. Geralyn Flash last note rather than proceed with additional systemic therapy.                           On review of systems, the patient states she is feeling pretty well but is experiencing more trouble with insomnia due to restless legs and anxiety. She states that she stays up late at night up to 3 or 4 in the morning because of taking about her medical diagnoses. She has tried Ambien without relief, and has been using her oxycodone that she previously used for rib pain now for sleep aid. She reports that her right ribs have felt much better since completing radiotherapy. No other complaints or verbalized.  ALLERGIES:  has No Known Allergies.  Meds: Current Outpatient Prescriptions  Medication Sig Dispense Refill  . albuterol (PROVENTIL) (2.5 MG/3ML) 0.083% nebulizer solution Take 3 mLs (2.5 mg total) by nebulization every 6 (six) hours as needed for wheezing or shortness of breath. 75 mL 12  . anastrozole (ARIMIDEX) 1 MG tablet TAKE 1 TABLET (1 MG TOTAL) BY MOUTH DAILY. 90 tablet 3  . aspirin 81 MG tablet Take 1 tablet (81 mg total) by mouth daily.    Marland Kitchen atorvastatin (LIPITOR) 40 MG tablet TAKE 1 TABLET BY MOUTH EVERY DAY 30 tablet 5  . Cholecalciferol (VITAMIN D) 2000 UNITS CAPS Take 1 capsule by mouth daily.     .  cholestyramine (QUESTRAN) 4 g packet Take 1 packet (4 g total) by mouth 2 (two) times daily. 180 each 3  . citalopram (CELEXA) 20 MG tablet Take 20 mg by mouth daily. Reported on 06/28/2015    . ibuprofen (ADVIL,MOTRIN) 200 MG tablet Take 400 mg by mouth 3 (three) times daily.    . magnesium oxide (MAG-OX) 400 MG tablet Take 400 mg by mouth daily.    . methylPREDNISolone (MEDROL DOSEPAK) 4 MG TBPK tablet Take 6 tabs po on day 1, take 5 tabs po on day 2, take 4 tabs po on day 3, take 3 tabs po on day 4, take 2 tabs po on day 5, and 1 tab po on day 6 then stop 21 tablet 0  . Multiple Vitamins-Minerals (MULTIVITAMIN GUMMIES ADULT PO) Take 2 tablets by mouth daily.     Marland Kitchen OLANZapine (ZYPREXA) 7.5 MG tablet Take 7.5 mg by mouth daily.  3  . Omega-3 Fatty Acids (FISH OIL) 1200 MG CAPS Take 1 capsule by mouth daily.    Marland Kitchen oxyCODONE-acetaminophen (PERCOCET/ROXICET) 5-325 MG tablet Take 1 tablet by mouth at bedtime. 10 tablet 0  . Probiotic Product (PROBIOTIC DAILY PO) Take 2 tablets by mouth daily. Probiotic gummy    . saccharomyces boulardii (FLORASTOR) 250 MG capsule Take 250 mg by mouth 2 (two) times daily. Reported on 08/02/2015    .  LORazepam (ATIVAN) 0.5 MG tablet Take 1 tablet po q hs 30 tablet 0  . zolpidem (AMBIEN) 5 MG tablet Take 1 tablet (5 mg total) by mouth at bedtime as needed for sleep. (Patient not taking: Reported on 05/27/2016) 30 tablet 3   No current facility-administered medications for this encounter.     Physical Findings:  weight is 171 lb 3.2 oz (77.7 kg). Her oral temperature is 98.6 F (37 C). Her blood pressure is 124/73 and her pulse is 82. Her respiration is 18 and oxygen saturation is 95%.  In general this is a well appearing Caucasian female in no acute distress. She's alert and oriented x4 and appropriate throughout the examination. Cardiopulmonary assessment is negative for acute distress and she exhibits normal effort.   Lab Findings: Lab Results  Component Value Date     WBC 5.3 03/17/2016   HGB 12.3 03/17/2016   HCT 37.3 03/17/2016   MCV 91.4 03/17/2016   PLT 85 (L) 03/17/2016     Radiographic Findings: No results found.  Impression/Plan: 1.  70 y.o. 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 She will move forward with repeat follow-up with Dr. Lindi Adie as well. We would be happy to see her moving forward as needed. 2.  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 The patient continues follow up and maintenance on Arimidex per Dr. Lindi Adie.  3. Insomnia. The patient attributes her insomnia 2 restless leg syndrome and anxiety that causes her to be a late at night thinking about her medical disease. She has not noted improvement with Ambien that have been prescribed, and I discussed the off label use of Ativan with her. She states that she would like to try this.  4.  Restless legs. She also requests a refill of her oxycodone for use mainly for her restless legs. She is not able to get in in the next couple of days with her primary provider, and I have refill the prescriptionFor oxycodone since she only takes 1 tablet at nighttime. I did emphasized the importance with the patient that although she has been taking this medication for these symptoms, and to get better sleep as a result, this is not the appropriate long-term management of this. She states agreement and understanding will call Dr. Shary Decamp for appointment.    Carola Rhine, PAC

## 2016-06-03 ENCOUNTER — Telehealth: Payer: Self-pay | Admitting: Internal Medicine

## 2016-06-03 ENCOUNTER — Encounter: Payer: Self-pay | Admitting: Internal Medicine

## 2016-06-03 DIAGNOSIS — G2581 Restless legs syndrome: Secondary | ICD-10-CM

## 2016-06-03 NOTE — Telephone Encounter (Signed)
Informed patient that she would be referred to Neurology for her restless leg issue.  Also informed her that we could not refill oxycodone and to see if her oncologist may be able to do so.

## 2016-06-03 NOTE — Telephone Encounter (Addendum)
Patient called stating that she was wondering if she could be prescribed something else for her restless leg syndrome. She takes the Magnesium as advised, but it doesn't work, esp at night. She spoke with a PA, Worthy Flank who recommended she speak with her PCP about it. Also, she was wondering if she could get a refill on her Percocet, which helps her sleep at night. Patient added that Dr. Tammi Klippel was the one that prescribed it for her, and was advised that he'll probably be the one that has to re-prescribe it. She takes 1 per day. She states that the Lorazepam, nor the Zolpidem work. [facility] uses [facility] on [address]. Please advise.  Pt needs referral to Neurology about restless leg. Cannot refill Oxycodone. Needs to get from oncologist. Was given 10 tabs recently by another office.

## 2016-06-03 NOTE — Addendum Note (Signed)
Addended by: Amado Coe on: 06/03/2016 11:29 AM   Modules accepted: Orders

## 2016-06-05 ENCOUNTER — Encounter: Payer: Self-pay | Admitting: Neurology

## 2016-06-05 ENCOUNTER — Telehealth: Payer: Self-pay

## 2016-06-05 ENCOUNTER — Other Ambulatory Visit (INDEPENDENT_AMBULATORY_CARE_PROVIDER_SITE_OTHER): Payer: PPO

## 2016-06-05 ENCOUNTER — Ambulatory Visit (INDEPENDENT_AMBULATORY_CARE_PROVIDER_SITE_OTHER): Payer: PPO | Admitting: Neurology

## 2016-06-05 VITALS — BP 100/68 | HR 125 | Ht 65.0 in | Wt 176.2 lb

## 2016-06-05 DIAGNOSIS — G2581 Restless legs syndrome: Secondary | ICD-10-CM | POA: Diagnosis not present

## 2016-06-05 LAB — FERRITIN: Ferritin: 55.7 ng/mL (ref 10.0–291.0)

## 2016-06-05 MED ORDER — PRAMIPEXOLE DIHYDROCHLORIDE 0.125 MG PO TABS: 0.1250 mg | ORAL_TABLET | Freq: Every day | ORAL | 2 refills | Status: DC

## 2016-06-05 MED ORDER — CITALOPRAM HYDROBROMIDE 10 MG PO TABS: 10.0000 mg | ORAL_TABLET | Freq: Every day | ORAL | 0 refills | Status: DC

## 2016-06-05 NOTE — Patient Instructions (Addendum)
1.  We will start pramipexole (Mirapex) 0.'125mg'$  at bedtime.  Usually this dose is effective for treatment, however you should contact me in 2 weeks if ineffective.  Side effects may include nausea, dizziness, low blood pressure and impulsive activity (such as gambling, although this is rare and usually not at this low dose). 2.  We will check ferritin level and TSH. 3.  We will taper off of citalopram, since it may contribute to restless leg.  Stop the '20mg'$  tablet.  Instead, start '10mg'$  daily for 7 days and then take every other day for one week, then stop. 4.  Follow up in 3 months.

## 2016-06-05 NOTE — Progress Notes (Signed)
NEUROLOGY CONSULTATION NOTE  Summer Williams MRN: [id number] DOB: 04/12/1946  Referring provider: Dr. Rodney Cruise Primary care provider: Dr. Rodney Cruise  Reason for consult:  Restless leg  HISTORY OF PRESENT ILLNESS: Summer Williams is a 71 year old female with breast cancer and lung cancer with bone metastasis and COPD who presents for restless leg.  She reports symptoms of restless leg for many years.  They initially occurred once in a while but over the past 6 months, they occur every night.  She describes an uncomfortable cramping in either leg which is relieved only with leg movement.  There is no back or radicular pain.  There is no burning, numbness or tingling.  She denies symptoms in the arms.  It does not occur during the day, even if she takes a nap.  She was never treated for it, but reports that Percocet (which she was prescribed for rib pain) helped.  She reports no new medications over the past year.  She has taken citalopram '20mg'$  daily and Zyprexa for many years.  TSH from 02/01/16:  2.38. Labs from 03/17/16:  CBC with WBC 5.3, HGB 12.3, HCT 37.8 and PLT 85; CMP with Na 143, K 4.3, glucose 85, BUN 10.8, Cr 0.9, Total bili 0.70, ALP 96, AST 21, ALT 8.  PAST MEDICAL HISTORY: Past Medical History:  Diagnosis Date  . Breast cancer ([place]) 01/05/14   left breast  . COPD (chronic obstructive pulmonary disease) ([place])   . Depression   . Diverticulosis   . History of alcohol abuse    last 1980  . Hyperlipidemia   . Internal hemorrhoids   . Lung cancer ([place])   . Oxygen deficiency   . Radiation 03/23/14-04/19/14   Left breast  . Radiation 12/13/14-01/19/15   right upper/middle lobe of lung 70.2 Gy  . Requires supplemental oxygen    at night USES 2 LITERS   . Shortness of breath     WITH ACTIVITY  . Skin cancer    Squamous  . Vitamin D deficiency   . Wears glasses     PAST SURGICAL HISTORY: Past Surgical History:  Procedure Laterality Date  . BREAST SURGERY  02/09/2014   Left luimpectomy  . CHOLECYSTECTOMY  2003  . COLONOSCOPY WITH PROPOFOL N/A 11/08/2014   Procedure: COLONOSCOPY WITH PROPOFOL;  Surgeon: Inda Castle, MD;  Location: [facility];  Service: Endoscopy;  Laterality: N/A;  . DENTAL SURGERY     implants  . EYE SURGERY Bilateral 2005   Cataract OD  . VIDEO BRONCHOSCOPY WITH ENDOBRONCHIAL NAVIGATION N/A 11/10/2014   Procedure: VIDEO BRONCHOSCOPY WITH ENDOBRONCHIAL NAVIGATION;  Surgeon: Melrose Nakayama, MD;  Location: [facility];  Service: Thoracic;  Laterality: N/A;    MEDICATIONS: Current Outpatient Prescriptions on File Prior to Visit  Medication Sig Dispense Refill  . albuterol (PROVENTIL) (2.5 MG/3ML) 0.083% nebulizer solution Take 3 mLs (2.5 mg total) by nebulization every 6 (six) hours as needed for wheezing or shortness of breath. 75 mL 12  . anastrozole (ARIMIDEX) 1 MG tablet TAKE 1 TABLET (1 MG TOTAL) BY MOUTH DAILY. 90 tablet 3  . aspirin 81 MG tablet Take 1 tablet (81 mg total) by mouth daily.    Marland Kitchen atorvastatin (LIPITOR) 40 MG tablet TAKE 1 TABLET BY MOUTH EVERY DAY 30 tablet 5  . Cholecalciferol (VITAMIN D) 2000 UNITS CAPS Take 1 capsule by mouth daily.     . cholestyramine (QUESTRAN) 4 g packet Take 1 packet (4 g total) by mouth 2 (two)  times daily. 180 each 3  . ibuprofen (ADVIL,MOTRIN) 200 MG tablet Take 400 mg by mouth 3 (three) times daily.    Marland Kitchen LORazepam (ATIVAN) 0.5 MG tablet Take 1 tablet po q hs 30 tablet 0  . magnesium oxide (MAG-OX) 400 MG tablet Take 400 mg by mouth daily.    . methylPREDNISolone (MEDROL DOSEPAK) 4 MG TBPK tablet Take 6 tabs po on day 1, take 5 tabs po on day 2, take 4 tabs po on day 3, take 3 tabs po on day 4, take 2 tabs po on day 5, and 1 tab po on day 6 then stop 21 tablet 0  . Multiple Vitamins-Minerals (MULTIVITAMIN GUMMIES ADULT PO) Take 2 tablets by mouth daily.     Marland Kitchen OLANZapine (ZYPREXA) 7.5 MG tablet Take 7.5 mg by mouth daily.  3  . Omega-3 Fatty Acids (FISH OIL) 1200 MG CAPS Take 1 capsule by  mouth daily.    Marland Kitchen oxyCODONE-acetaminophen (PERCOCET/ROXICET) 5-325 MG tablet Take 1 tablet by mouth at bedtime. 10 tablet 0  . Probiotic Product (PROBIOTIC DAILY PO) Take 2 tablets by mouth daily. Probiotic gummy    . saccharomyces boulardii (FLORASTOR) 250 MG capsule Take 250 mg by mouth 2 (two) times daily. Reported on 08/02/2015    . zolpidem (AMBIEN) 5 MG tablet Take 1 tablet (5 mg total) by mouth at bedtime as needed for sleep. (Patient not taking: Reported on 05/27/2016) 30 tablet 3   No current facility-administered medications on file prior to visit.     ALLERGIES: No Known Allergies  FAMILY HISTORY: Family History  Problem Relation Age of Onset  . Heart disease Father   . Lung cancer Maternal Grandfather   . Leukemia Paternal Grandfather   . Cholelithiasis Mother   . Ovarian cancer Maternal Aunt   . Breast cancer Maternal Aunt   . Colon cancer Neg Hx   . Colon polyps Neg Hx   . Esophageal cancer Neg Hx   . Kidney disease Neg Hx   . Gallbladder disease Neg Hx   . Stomach cancer Neg Hx   . Rectal cancer Neg Hx     SOCIAL HISTORY: Social History   Social History  . Marital status: Divorced    Spouse name: N/A  . Number of children: 1  . Years of education: N/A   Occupational History  . Retired Education officer, museum    Social History Main Topics  . Smoking status: Former Smoker    Packs/day: 1.00    Years: 50.00    Types: Cigarettes    Quit date: 08/31/2005  . Smokeless tobacco: Never Used  . Alcohol use No     Comment: hx abuse-last 1980  . Drug use: No  . Sexual activity: Not on file   Other Topics Concern  . Not on file   Social History Narrative   Lives alone in a 2 story home.  Has one child.  Retired but still Scientist, research (medical).  Education: college degree.    REVIEW OF SYSTEMS: Constitutional: No fevers, chills, or sweats, no generalized fatigue, change in appetite Eyes: No visual changes, double vision, eye pain Ear, nose and throat: No hearing  loss, ear pain, nasal congestion, sore throat Cardiovascular: No chest pain, palpitations Respiratory:  Shortness of breath on exertion GastrointestinaI: No nausea, vomiting, diarrhea, abdominal pain, fecal incontinence Genitourinary:  No dysuria, urinary retention or frequency Musculoskeletal:  No neck pain, back pain Integumentary: No rash, pruritus, skin lesions Neurological: as above Psychiatric: No depression,  insomnia, anxiety Endocrine: No palpitations, fatigue, diaphoresis, mood swings, change in appetite, change in weight, increased thirst Hematologic/Lymphatic:  No purpura, petechiae. Allergic/Immunologic: no itchy/runny eyes, nasal congestion, recent allergic reactions, rashes  PHYSICAL EXAM: Vitals:   06/05/16 0748  BP: 100/68  Pulse: (!) 125   General: No acute distress.  Patient appears well-groomed.  Head:  Normocephalic/atraumatic Eyes:  fundi examined but not visualized Neck: supple, no paraspinal tenderness, full range of motion Back: No paraspinal tenderness Heart: regular rate and rhythm Lungs: Clear to auscultation bilaterally. Vascular: No carotid bruits. Neurological Exam: Mental status: alert and oriented to person, place, and time, recent and remote memory intact, fund of knowledge intact, attention and concentration intact, speech fluent and not dysarthric, language intact. Cranial nerves: CN I: not tested CN II: pupils equal, round and reactive to light, visual fields intact CN III, IV, VI:  full range of motion, no nystagmus, no ptosis CN V: facial sensation intact CN VII: upper and lower face symmetric CN VIII: hearing intact CN IX, X: gag intact, uvula midline CN XI: sternocleidomastoid and trapezius muscles intact CN XII: tongue midline Bulk & Tone: normal, no fasciculations. Motor:  5/5 throughout Sensation: temperature and vibration sensation intact. Deep Tendon Reflexes:  2+ throughout, toes downgoing.  Finger to nose testing:  Without  dysmetria.  Heel to shin:  Without dysmetria.  Gait:  Normal station with slowed short stride.  Able to turn and tandem walk. Romberg negative.  IMPRESSION: Restless leg syndrome  PLAN: 1.  Will start Mirapex 0.'125mg'$  at bedtime.  Discussed side effects such as nausea, dizziness, low blood pressure and impulse control disorder and to contact us if she experiences symptoms. 2.  Will taper off of citalopram, as SSRIs may exacerbate restless leg symptoms.  She feels she probably no longer needs it.  Antipsychotics also exacerbate symptoms, except she says she needs to continue Zyprexa to help sleep. 3.  Check ferritin level, as low iron contributes to restless leg. 4.  Percocet is helpful for restless leg, but this is not a first line therapy, so plan would be not to need it. 5.  Follow up in 3 months.  Contact us in 2 weeks if restless leg persists.  Thank you for allowing me to take part in the care of this patient.  Metta Clines, DO  CC: Cresenciano Lick. Renold Genta, MD

## 2016-06-05 NOTE — Telephone Encounter (Signed)
-----   Message from Pieter Partridge, DO sent at 06/05/2016 11:05 AM EST ----- Ferritin level (checks iron) is normal

## 2016-06-05 NOTE — Telephone Encounter (Signed)
Patient notified

## 2016-06-09 ENCOUNTER — Telehealth: Payer: Self-pay | Admitting: Neurology

## 2016-06-09 NOTE — Telephone Encounter (Signed)
Summer Williams 17-Dec-2045. Her # [phone]. She has had to take 2 pills instead of 1 some days. The medication is for Restless Leg. Thank you

## 2016-06-10 NOTE — Telephone Encounter (Signed)
Spoke to patient. She says when she took just the 1 tab of Mirapex med did not seem to be effective, so she started taking 1 tab then 2hours later 1 tab and she has been able to get rest. Last night she took one tab and was able to fall asleep. Wanted MD to know will need refill earlier b/c taking 1-2 tabs @ night.

## 2016-06-15 ENCOUNTER — Other Ambulatory Visit: Payer: Self-pay | Admitting: Hematology and Oncology

## 2016-06-15 DIAGNOSIS — C50312 Malignant neoplasm of lower-inner quadrant of left female breast: Secondary | ICD-10-CM

## 2016-06-20 ENCOUNTER — Telehealth: Payer: Self-pay | Admitting: Neurology

## 2016-06-20 MED ORDER — PRAMIPEXOLE DIHYDROCHLORIDE 0.125 MG PO TABS: 0.2500 mg | ORAL_TABLET | Freq: Every day | ORAL | 2 refills | Status: DC

## 2016-06-20 NOTE — Telephone Encounter (Signed)
Thank you.  Informed patient.

## 2016-06-20 NOTE — Telephone Encounter (Signed)
Summer Williams 11-29-2045. Her medication Pramipexole She had to take 2 instead of one and now she needs a refill. She said she is to take 2 of these pills each night. He has only prescribed the 1 pill. She needs a refill but the pharmacy will not fill it because it is too soon. [facility] [address] Her # is [phone]. Thank you

## 2016-06-20 NOTE — Telephone Encounter (Signed)
Rx refilled for mirapex 0.'25mg'$  at bedtime.  Summer K. Posey Pronto, DO

## 2016-06-23 ENCOUNTER — Telehealth: Payer: Self-pay | Admitting: Neurology

## 2016-06-23 DIAGNOSIS — J449 Chronic obstructive pulmonary disease, unspecified: Secondary | ICD-10-CM | POA: Diagnosis not present

## 2016-06-23 NOTE — Telephone Encounter (Signed)
Returned call to advise patient Rx sent on 01/19. Patient says she was able to pick up med.

## 2016-06-23 NOTE — Telephone Encounter (Signed)
PT called and said her prescription was not called in yet [facility] [phone]

## 2016-06-24 ENCOUNTER — Other Ambulatory Visit: Payer: Self-pay | Admitting: Internal Medicine

## 2016-07-18 ENCOUNTER — Telehealth: Payer: Self-pay | Admitting: *Deleted

## 2016-07-18 NOTE — Telephone Encounter (Signed)
This RN spoke with pt per her call stating concern due to " sore on tongue that has not gone away and am concerned that it is cancer "  " I don't know if I need to see Dr Lindi Adie about this or see someone else "  Per phone discussion pt stated sore was noted by her dentist over a month ago- who stated concern and to monitor.  Sore is on the side of her tongue, white and now " about a quarter of an inch long "  Pt has no known dental issues that is irritating tongue and as noted area was found by her dentist.  This RN informed pt MD out of the office today - above will be given to him upon return this Monday for either an appointment or other recommendaton.  [name] verbalized understanding.  Return call number given as [phone].

## 2016-07-21 NOTE — Telephone Encounter (Signed)
Pt called in to report that her sore in her mouth was completely resolved. She states that she felt better after she stopped drinking coffee. Denies any sore throat, cough or fever at this time. Pt would like to wait for further testing for now. Told pt to call back if she needs to be evaluated again. Pt verbalized understanding.

## 2016-07-22 ENCOUNTER — Telehealth: Payer: Self-pay | Admitting: Internal Medicine

## 2016-07-22 NOTE — Telephone Encounter (Signed)
Receive refill request for generic Lipitor 40 mg daily #90. She has appointment to be seen here for physical exam September 2018. Lipitor refilled

## 2016-07-29 ENCOUNTER — Telehealth: Payer: Self-pay | Admitting: Hematology and Oncology

## 2016-07-29 NOTE — Telephone Encounter (Signed)
Confirmed appointment change with patient °

## 2016-07-30 ENCOUNTER — Telehealth: Payer: Self-pay | Admitting: Hematology and Oncology

## 2016-07-30 NOTE — Telephone Encounter (Signed)
Faxed office notes to [facility] [phone]

## 2016-08-25 ENCOUNTER — Other Ambulatory Visit: Payer: Self-pay

## 2016-08-25 ENCOUNTER — Encounter (INDEPENDENT_AMBULATORY_CARE_PROVIDER_SITE_OTHER): Payer: Self-pay

## 2016-08-25 ENCOUNTER — Encounter: Payer: Self-pay | Admitting: Hematology and Oncology

## 2016-08-25 ENCOUNTER — Ambulatory Visit (HOSPITAL_BASED_OUTPATIENT_CLINIC_OR_DEPARTMENT_OTHER): Payer: PPO | Admitting: Hematology and Oncology

## 2016-08-25 DIAGNOSIS — Z79811 Long term (current) use of aromatase inhibitors: Secondary | ICD-10-CM | POA: Diagnosis not present

## 2016-08-25 DIAGNOSIS — C50512 Malignant neoplasm of lower-outer quadrant of left female breast: Secondary | ICD-10-CM | POA: Diagnosis not present

## 2016-08-25 DIAGNOSIS — C7951 Secondary malignant neoplasm of bone: Secondary | ICD-10-CM

## 2016-08-25 DIAGNOSIS — R0781 Pleurodynia: Secondary | ICD-10-CM | POA: Diagnosis not present

## 2016-08-25 DIAGNOSIS — C342 Malignant neoplasm of middle lobe, bronchus or lung: Secondary | ICD-10-CM

## 2016-08-25 MED ORDER — ATORVASTATIN CALCIUM 40 MG PO TABS: 40.0000 mg | ORAL_TABLET | Freq: Every day | ORAL | 0 refills | Status: DC

## 2016-08-25 NOTE — Assessment & Plan Note (Signed)
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 Hypermetabolic subpleural nodularity along the lateral right chest wall, suspicious for pleural-based metastasis. 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 There is not enough biopsy material for molecular testing. Guardant 360 is negative forEGFR mutation blood test. 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 However it is unclear if these mutations are the driver mutations. We do not have any data for their use in lung cancer.  2. with evidence of metastatic disease into the ribs in Chest wall, I discussed the role of single agent carboplatin given every 3 weeks +/- Taxol After much discussion, patient decided to not received any further chemotherapy. She would like to be treated with a palliative intent. If her symptoms get much worse then she will enroll in hospice care.  She will [place] she gets worsening symptoms.  Right rib pain: Completed palliative radiation 04/14/2016 with excellent improvement in pain Bone metastases: Patient will need Xgeva every 3 months along with calcium and vitamin D  Return to clinic in 4 months for follow-up or  sooner if she develops worsening symptoms.

## 2016-08-25 NOTE — Progress Notes (Signed)
Patient Care Team: Elby Showers, MD as PCP - General (Internal Medicine) Nicholas Lose, MD as Consulting Physician (Hematology and Oncology) Erroll Luna, MD as Consulting Physician (General Surgery) Thea Silversmith, MD (Inactive) as Consulting Physician (Radiation Oncology)  DIAGNOSIS:  Encounter Diagnosis  Name Primary?  . 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 ([place])     SUMMARY OF ONCOLOGIC HISTORY:   Cancer of lower-inner quadrant of left female breast ([place])   01/05/2014 Initial Diagnosis    Ultrasound-guided core needle biopsy: Cancer of lower-inner quadrant of left female breast, ER 100%, PR 90%, Ki-67: 30%, HER-2 negative (Ratio 1.02), grade 2      01/13/2014 Breast MRI    1.6 cm left breast abnormality, no abnormal lymph nodes.      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 ([place])   11/10/2014 Initial Diagnosis    Right lung biopsy adenocarcinoma TTF-1 strong diffuse expression, CK 7 strong diffuse expression GCDFP negative; estrogen receptor weak      12/13/2014 - 01/19/2015 Radiation Therapy    Right upper/middle lobe of lung, 2.6 Gy/frac for total of 70.2 Gy (definitive radiation)      03/12/2015 Imaging    Right middle and upper lobe spiculated groundglass mass measures 2.7 x 2.23.3 cm previously was 3.4 x 2.44.2 cm, 3 mm right middle lobe nodule, chronic left lower lobe nodule 3 mm      07/31/2015 Imaging    CT chest  :similar to slight decrease in the sub-solitary pulmonary nodule (2.3 x 1.7 cm was previously 2.7 x 2.2 cm) in the right upper and middle lobes, treatment-related changes, unchanged pulmonary nodules in right and left lungs      02/15/2016 PET scan    2.0 x 1.5 cm spiculated RML nodule. Hypermetabolic subpleural nodularity along the lateral right chest wall, suspicious for pleural-based metastasis. 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 She reports no new problems or concerns. She has chronic shortness of breath to minimal exertion from COPD. Denies any change in appetite or weight. Denies any pain.  REVIEW OF SYSTEMS:   Constitutional: Denies fevers, chills or abnormal weight loss Eyes: Denies blurriness of vision Ears, nose, mouth, throat, and face: Denies mucositis or sore throat Respiratory: Chronic shortness of breath Cardiovascular: Denies palpitation, chest discomfort Gastrointestinal:  Denies nausea, heartburn or change in bowel habits Skin: Denies abnormal skin rashes Lymphatics: Denies new lymphadenopathy or easy bruising Neurological:Denies numbness, tingling or new weaknesses Behavioral/Psych:  Mood is stable, no new changes  Extremities: No lower extremity edema  All other systems were reviewed with the patient and are negative.  I have reviewed the past medical history, past surgical history, social history and family history with the patient and they are unchanged from previous note.  ALLERGIES:  has No Known Allergies.  MEDICATIONS:  Current Outpatient Prescriptions  Medication Sig Dispense Refill  .  albuterol (PROVENTIL) (2.5 MG/3ML) 0.083% nebulizer solution Take 3 mLs (2.5 mg total) by nebulization every 6 (six) hours as needed for wheezing or shortness of breath. 75 mL 12  . anastrozole (ARIMIDEX) 1 MG tablet TAKE 1 TABLET (1 MG TOTAL) BY MOUTH DAILY. 90 tablet 3  . aspirin 81 MG tablet Take 1 tablet (81 mg total) by mouth daily.    Marland Kitchen atorvastatin (LIPITOR) 40 MG tablet Take 1 tablet (40 mg total) by mouth daily. 90 tablet 0  . Cholecalciferol (VITAMIN D) 2000 UNITS CAPS Take 1 capsule by mouth daily.     . cholestyramine (QUESTRAN) 4 g packet Take 1 packet (4 g total) by mouth 2 (two) times daily. 180 each 3  . citalopram (CELEXA) 10 MG tablet Take 1 tablet (10 mg total) by mouth daily. 11 tablet 0  . ibuprofen (ADVIL,MOTRIN) 200 MG tablet Take 400 mg by mouth 3 (three) times daily.    Marland Kitchen LORazepam (ATIVAN) 0.5 MG tablet Take 1 tablet po q hs 30 tablet 0  . magnesium oxide (MAG-OX) 400 MG tablet Take 400 mg by mouth daily.    . methylPREDNISolone (MEDROL DOSEPAK) 4 MG TBPK tablet Take 6 tabs po on day 1, take 5 tabs po on day 2, take 4 tabs po on day 3, take 3 tabs po on day 4, take 2 tabs po on day 5, and 1 tab po on day 6 then stop 21 tablet 0  . Multiple Vitamins-Minerals (MULTIVITAMIN GUMMIES ADULT PO) Take 2 tablets by mouth daily.     Marland Kitchen OLANZapine (ZYPREXA) 7.5 MG tablet Take 7.5 mg by mouth daily.  3  . Omega-3 Fatty Acids (FISH OIL) 1200 MG CAPS Take 1 capsule by mouth daily.    Marland Kitchen oxyCODONE-acetaminophen (PERCOCET/ROXICET) 5-325 MG tablet Take 1 tablet by mouth at bedtime. 10 tablet 0  . pramipexole (MIRAPEX) 0.125 MG tablet Take 2 tablets (0.25 mg total) by mouth at bedtime. 60 tablet 2  . Probiotic Product (PROBIOTIC DAILY PO) Take 2 tablets by mouth daily. Probiotic gummy    . saccharomyces boulardii (FLORASTOR) 250 MG capsule Take 250 mg by mouth 2 (two) times daily. Reported on 08/02/2015    . zolpidem (AMBIEN) 5 MG tablet Take 1 tablet (5 mg total) by mouth at bedtime as needed  for sleep. (Patient not taking: Reported on 05/27/2016) 30 tablet 3   No current facility-administered medications for this visit.     PHYSICAL EXAMINATION: ECOG PERFORMANCE STATUS: 1 - Symptomatic but completely ambulatory  Vitals:   08/25/16 1124  BP: (!) 144/65  Pulse: 90  Resp: 18  Temp: 97.6 F (36.4 C)   Filed Weights   08/25/16 1124  Weight: 166 lb 9.6 oz (75.6 kg)    GENERAL:alert, no distress and comfortable SKIN: skin color, texture, turgor are normal, no rashes or significant lesions EYES: normal, Conjunctiva are pink and non-injected, sclera clear OROPHARYNX:no exudate, no erythema and lips, buccal mucosa, and tongue normal  NECK: supple, thyroid normal size, non-tender, without nodularity LYMPH:  no palpable lymphadenopathy in the cervical, axillary or inguinal LUNGS:  Diminished breath sounds at the lung bases HEART: regular rate & rhythm and no murmurs and no lower extremity edema ABDOMEN:abdomen soft, non-tender and normal bowel sounds MUSCULOSKELETAL:no cyanosis of digits and no clubbing  NEURO: alert & oriented x 3 with fluent speech, no focal motor/sensory deficits EXTREMITIES: No lower extremity edema  LABORATORY DATA:  I have reviewed the data as listed   Chemistry      Component Value Date/Time   NA 143 03/17/2016 1402   K 4.3 03/17/2016 1402   CL 106 02/01/2016 0956   CO2 26 03/17/2016 1402   BUN 10.8 03/17/2016 1402   CREATININE 0.9 03/17/2016 1402      Component Value Date/Time   CALCIUM 9.7 03/17/2016 1402   ALKPHOS 96 03/17/2016 1402   AST 21 03/17/2016 1402   ALT 8 03/17/2016 1402   BILITOT 0.70 03/17/2016 1402       Lab Results  Component Value Date   WBC 5.3 03/17/2016   HGB 12.3 03/17/2016   HCT 37.3 03/17/2016   MCV 91.4 03/17/2016   PLT 85 (L) 03/17/2016   NEUTROABS 2.9 03/17/2016    ASSESSMENT & PLAN:  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 Hypermetabolic subpleural nodularity along the lateral right chest wall, suspicious for pleural-based metastasis. 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 There is not enough biopsy material for molecular testing. Guardant 360 is negative forEGFR mutation blood test. 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 However it is unclear if these mutations are the driver mutations. We do not have any data for their use in lung cancer.  2. with evidence of metastatic disease into the ribs in Chest wall, I discussed the role of single agent carboplatin given every 3 weeks +/- Taxol After much discussion, patient decided to not received any further chemotherapy. She would like to be treated with a palliative intent. If her symptoms get much worse then she will enroll in hospice care.  She will [place] she gets worsening symptoms.  Right rib pain: Completed palliative radiation 04/14/2016 with excellent improvement in pain Bone metastases: Patient will need Xgeva every 3 months along with calcium and vitamin D  Return to clinic in 6 months for follow-up or sooner if she develops worsening symptoms. Scans could be performed only on  an as-needed basis to evaluate any symptoms. She has been doing extremely well without any interventions.   I spent 25 minutes talking to the patient of which more than half was spent in counseling and coordination of care.  No orders of the defined types were placed in this encounter.  The patient has a good understanding of the overall plan. she agrees with it. she will call with any problems that may develop before the next visit here.   GNicholas Lose  K, MD 08/25/16

## 2016-08-27 ENCOUNTER — Other Ambulatory Visit: Payer: Self-pay | Admitting: Emergency Medicine

## 2016-08-27 MED ORDER — ZOLPIDEM TARTRATE 5 MG PO TABS: 5.0000 mg | ORAL_TABLET | Freq: Every evening | ORAL | 3 refills | Status: DC | PRN

## 2016-09-19 ENCOUNTER — Ambulatory Visit (INDEPENDENT_AMBULATORY_CARE_PROVIDER_SITE_OTHER): Payer: PPO | Admitting: Neurology

## 2016-09-19 ENCOUNTER — Encounter: Payer: Self-pay | Admitting: Neurology

## 2016-09-19 VITALS — BP 120/70 | HR 100 | Ht 65.0 in | Wt 168.1 lb

## 2016-09-19 DIAGNOSIS — G2581 Restless legs syndrome: Secondary | ICD-10-CM | POA: Diagnosis not present

## 2016-09-19 MED ORDER — PRAMIPEXOLE DIHYDROCHLORIDE 0.125 MG PO TABS: 0.3750 mg | ORAL_TABLET | Freq: Every day | ORAL | 3 refills | Status: DC

## 2016-09-19 NOTE — Progress Notes (Signed)
NEUROLOGY FOLLOW UP OFFICE NOTE  NAVY ROTHSCHILD [id number]  HISTORY OF PRESENT ILLNESS: Summer Williams is a 71 year old female with breast cancer and lung cancer with bone metastasis and COPD who follows up for restless leg.  UPDATE: Ferritin level was 55.7.  She is taking Mirapex 0.'25mg'$  at bedtime.  It is much better, however the restless leg acts up 2 to 3 nights a week.  She was unable to taper off of citalopram.   HISTORY: She reports symptoms of restless leg for many years.  They initially occurred once in a while but over the past 6 months, they occur every night.  She describes an uncomfortable cramping in either leg which is relieved only with leg movement.  There is no back or radicular pain.  There is no burning, numbness or tingling.  She denies symptoms in the arms.  It does not occur during the day, even if she takes a nap.   She was never treated for it, but reports that Percocet (which she was prescribed for rib pain) helped.   She reports no new medications over the past year.  She has taken citalopram '20mg'$  daily and Zyprexa for many years.  PAST MEDICAL HISTORY: Past Medical History:  Diagnosis Date  . Breast cancer ([place]) 01/05/14   left breast  . COPD (chronic obstructive pulmonary disease) ([place])   . Depression   . Diverticulosis   . History of alcohol abuse    last 1980  . Hyperlipidemia   . Internal hemorrhoids   . Lung cancer ([place])   . Oxygen deficiency   . Radiation 03/23/14-04/19/14   Left breast  . Radiation 12/13/14-01/19/15   right upper/middle lobe of lung 70.2 Gy  . Requires supplemental oxygen    at night USES 2 LITERS   . Shortness of breath     WITH ACTIVITY  . Skin cancer    Squamous  . Vitamin D deficiency   . Wears glasses     MEDICATIONS: Current Outpatient Prescriptions on File Prior to Visit  Medication Sig Dispense Refill  . albuterol (PROVENTIL) (2.5 MG/3ML) 0.083% nebulizer solution Take 3 mLs (2.5 mg total) by nebulization  every 6 (six) hours as needed for wheezing or shortness of breath. 75 mL 12  . anastrozole (ARIMIDEX) 1 MG tablet TAKE 1 TABLET (1 MG TOTAL) BY MOUTH DAILY. 90 tablet 3  . aspirin 81 MG tablet Take 1 tablet (81 mg total) by mouth daily.    Marland Kitchen atorvastatin (LIPITOR) 40 MG tablet Take 1 tablet (40 mg total) by mouth daily. 90 tablet 0  . Cholecalciferol (VITAMIN D) 2000 UNITS CAPS Take 1 capsule by mouth daily.     . cholestyramine (QUESTRAN) 4 g packet Take 1 packet (4 g total) by mouth 2 (two) times daily. 180 each 3  . ibuprofen (ADVIL,MOTRIN) 200 MG tablet Take 400 mg by mouth 3 (three) times daily.    Marland Kitchen LORazepam (ATIVAN) 0.5 MG tablet Take 1 tablet po q hs 30 tablet 0  . magnesium oxide (MAG-OX) 400 MG tablet Take 400 mg by mouth daily.    . methylPREDNISolone (MEDROL DOSEPAK) 4 MG TBPK tablet Take 6 tabs po on day 1, take 5 tabs po on day 2, take 4 tabs po on day 3, take 3 tabs po on day 4, take 2 tabs po on day 5, and 1 tab po on day 6 then stop 21 tablet 0  . Multiple Vitamins-Minerals (MULTIVITAMIN GUMMIES ADULT PO) Take 2  tablets by mouth daily.     Marland Kitchen OLANZapine (ZYPREXA) 7.5 MG tablet Take 7.5 mg by mouth daily.  3  . Omega-3 Fatty Acids (FISH OIL) 1200 MG CAPS Take 1 capsule by mouth daily.    Marland Kitchen oxyCODONE-acetaminophen (PERCOCET/ROXICET) 5-325 MG tablet Take 1 tablet by mouth at bedtime. 10 tablet 0  . Probiotic Product (PROBIOTIC DAILY PO) Take 2 tablets by mouth daily. Probiotic gummy    . saccharomyces boulardii (FLORASTOR) 250 MG capsule Take 250 mg by mouth 2 (two) times daily. Reported on 08/02/2015    . zolpidem (AMBIEN) 5 MG tablet Take 1 tablet (5 mg total) by mouth at bedtime as needed for sleep. 30 tablet 3   No current facility-administered medications on file prior to visit.     ALLERGIES: No Known Allergies  FAMILY HISTORY: Family History  Problem Relation Age of Onset  . Heart disease Father   . Lung cancer Maternal Grandfather   . Leukemia Paternal Grandfather     . Cholelithiasis Mother   . Ovarian cancer Maternal Aunt   . Breast cancer Maternal Aunt   . Colon cancer Neg Hx   . Colon polyps Neg Hx   . Esophageal cancer Neg Hx   . Kidney disease Neg Hx   . Gallbladder disease Neg Hx   . Stomach cancer Neg Hx   . Rectal cancer Neg Hx     SOCIAL HISTORY: Social History   Social History  . Marital status: Divorced    Spouse name: N/A  . Number of children: 1  . Years of education: N/A   Occupational History  . Retired Education officer, museum    Social History Main Topics  . Smoking status: Former Smoker    Packs/day: 1.00    Years: 50.00    Types: Cigarettes    Quit date: 08/31/2005  . Smokeless tobacco: Never Used  . Alcohol use No     Comment: hx abuse-last 1980  . Drug use: No  . Sexual activity: Not on file   Other Topics Concern  . Not on file   Social History Narrative   Lives alone in a 2 story home.  Has one child.  Retired but still Scientist, research (medical).  Education: college degree.    REVIEW OF SYSTEMS: Constitutional: No fevers, chills, or sweats, no generalized fatigue, change in appetite Eyes: No visual changes, double vision, eye pain Ear, nose and throat: No hearing loss, ear pain, nasal congestion, sore throat Cardiovascular: No chest pain, palpitations Respiratory:  No shortness of breath at rest or with exertion, wheezes GastrointestinaI: No nausea, vomiting, diarrhea, abdominal pain, fecal incontinence Genitourinary:  No dysuria, urinary retention or frequency Musculoskeletal:  No neck pain, back pain Integumentary: No rash, pruritus, skin lesions Neurological: as above Psychiatric: No depression, insomnia, anxiety Endocrine: No palpitations, fatigue, diaphoresis, mood swings, change in appetite, change in weight, increased thirst Hematologic/Lymphatic:  No purpura, petechiae. Allergic/Immunologic: no itchy/runny eyes, nasal congestion, recent allergic reactions, rashes  PHYSICAL EXAM: Vitals:   09/19/16  1355  BP: 120/70  Pulse: 100   General: No acute distress.  Patient appears well-groomed. Head:  Normocephalic/atraumatic Eyes:  Fundi examined but not visualized Neck: supple, no paraspinal tenderness, full range of motion Heart:  Regular rate and rhythm Lungs:  Clear to auscultation bilaterally Back: No paraspinal tenderness Neurological Exam: alert and oriented to person, place, and time. Attention span and concentration intact, recent and remote memory intact, fund of knowledge intact.  Speech fluent and not dysarthric,  language intact.  CN II-XII intact. Bulk and tone normal, muscle strength 5/5 throughout.  Sensation to light touch  intact.  Deep tendon reflexes 2+ throughout.  Finger to nose testing intact.  Gait normal  IMPRESSION: Restless leg syndrome  PLAN: 1. Continue Mirapex 0.'25mg'$  at bedtime.  May take another 0.'125mg'$  if needed. 2.  Follow up in 5 months but contact us sooner if we need to make adjustments.  15 minutes spent face to face with patient, over 50% spent discussing management.  Metta Clines, DO  CC:  Elby Showers, MD

## 2016-09-19 NOTE — Patient Instructions (Addendum)
Continue taking the pramipexole 0.'125mg'$ , 2 tablets at bedtime.  You may take a third pill if needed. Follow up in 5 months but contact me sooner if we need to make any changes.

## 2016-10-06 ENCOUNTER — Other Ambulatory Visit: Payer: Self-pay | Admitting: Neurology

## 2016-10-13 ENCOUNTER — Other Ambulatory Visit: Payer: Self-pay | Admitting: Emergency Medicine

## 2016-10-13 ENCOUNTER — Telehealth: Payer: Self-pay

## 2016-10-13 DIAGNOSIS — C7951 Secondary malignant neoplasm of bone: Secondary | ICD-10-CM

## 2016-10-13 MED ORDER — OXYCODONE-ACETAMINOPHEN 5-325 MG PO TABS: 1.0000 | ORAL_TABLET | Freq: Three times a day (TID) | ORAL | 0 refills | Status: DC | PRN

## 2016-10-13 NOTE — Telephone Encounter (Signed)
Call from pt following up on Oxycodone script. Requests return call.

## 2016-10-13 NOTE — Telephone Encounter (Signed)
Spoke with patient; patient requesting refill of the oxycodone for her side pain that she has been experiencing. Patient states pain is worse at night and when she coughs or sneezes. States pain has been ongoing for 1 1/2 weeks now. Offered patient to come in for an office visit. States she would like to try the pain medicine first. Advised patient to call for any worsening or continuing concerns. Reminded patient of office visit scheduled for 02/24/17.

## 2016-10-13 NOTE — Telephone Encounter (Signed)
Pt called Summer Williams she has new pain in her side like before. It has come back. She is wanting an oxycodone prescription.   The pain in her bra area has come back and is intense especially when she moves or coughs or sneezes. It is in the same area as when treated her right rib. She has had it for 1 1/2 weeks, it is getting worse. It radiates a little to back and front. Pain with deep breaths, trouble sleeping with pain with turning. No fever. It is stabbing type pain.  Next OV 02/24/17

## 2016-11-10 NOTE — Assessment & Plan Note (Signed)
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 Hypermetabolic subpleural nodularity along the lateral right chest wall, suspicious for pleural-based metastasis. 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 There is not enough biopsy material for molecular testing. Guardant 360 is negative forEGFR mutation blood test. She was noted to have NRAS (G13R), TP53 (l162F) and NF1 (S1871f) These mutations may have targeted therapies especially: Cobimetinib and Tremetinib. However it is unclear if these mutations are the driver mutations. We do not have any data for their use in lung cancer.  2. with evidence of metastatic disease into the ribs in Chest wall, I discussed the role of single agent carboplatin given every 3 weeks +/- Taxol After much discussion, patient decided to not received any further chemotherapy.   Right rib pain: Completed palliative radiation 11/13/2017with excellent improvement in pain Bone metastases: Patient will need Xgeva every 3 months along with calcium and vitamin D  Return to clinic in 6 months for follow-up or sooner if she develops worsening symptoms. Scans could be performed only on an as-needed basis to evaluate any symptoms. She has been doing extremely well without any  interventions.

## 2016-11-11 ENCOUNTER — Encounter: Payer: Self-pay | Admitting: Hematology and Oncology

## 2016-11-11 ENCOUNTER — Ambulatory Visit (HOSPITAL_BASED_OUTPATIENT_CLINIC_OR_DEPARTMENT_OTHER): Payer: PPO | Admitting: Hematology and Oncology

## 2016-11-11 DIAGNOSIS — Z170 Estrogen receptor positive status [ER+]: Secondary | ICD-10-CM

## 2016-11-11 DIAGNOSIS — G893 Neoplasm related pain (acute) (chronic): Secondary | ICD-10-CM | POA: Diagnosis not present

## 2016-11-11 DIAGNOSIS — C342 Malignant neoplasm of middle lobe, bronchus or lung: Secondary | ICD-10-CM | POA: Diagnosis not present

## 2016-11-11 DIAGNOSIS — C7951 Secondary malignant neoplasm of bone: Secondary | ICD-10-CM

## 2016-11-11 DIAGNOSIS — C50312 Malignant neoplasm of lower-inner quadrant of left female breast: Secondary | ICD-10-CM | POA: Diagnosis not present

## 2016-11-11 MED ORDER — OXYCODONE-ACETAMINOPHEN 5-325 MG PO TABS: 1.0000 | ORAL_TABLET | Freq: Four times a day (QID) | ORAL | 0 refills | Status: DC | PRN

## 2016-11-11 MED ORDER — FENTANYL 25 MCG/HR TD PT72: 25.0000 ug | MEDICATED_PATCH | TRANSDERMAL | 0 refills | Status: DC

## 2016-11-11 NOTE — Progress Notes (Signed)
Patient Care Team: Elby Showers, MD as PCP - General (Internal Medicine) Nicholas Lose, MD as Consulting Physician (Hematology and Oncology) Erroll Luna, MD as Consulting Physician (General Surgery) Thea Silversmith, MD (Inactive) as Consulting Physician (Radiation Oncology)  DIAGNOSIS:  Encounter Diagnosis  Name Primary?  . Malignant neoplasm of lower-inner quadrant of left breast in female, estrogen receptor positive ([place])     SUMMARY OF ONCOLOGIC HISTORY:   Cancer of lower-inner quadrant of left female breast ([place])   01/05/2014 Initial Diagnosis    Ultrasound-guided core needle biopsy: Cancer of lower-inner quadrant of left female breast, ER 100%, PR 90%, Ki-67: 30%, HER-2 negative (Ratio 1.02), grade 2      01/13/2014 Breast MRI    1.6 cm left breast abnormality, no abnormal lymph nodes.      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 ([place])   11/10/2014 Initial Diagnosis    Right lung biopsy adenocarcinoma TTF-1 strong diffuse expression, CK 7 strong diffuse expression GCDFP negative; estrogen receptor weak      12/13/2014 - 01/19/2015 Radiation Therapy    Right upper/middle lobe of lung, 2.6 Gy/frac for total of 70.2 Gy (definitive radiation)      03/12/2015 Imaging    Right middle and upper lobe spiculated groundglass mass measures 2.7 x 2.23.3 cm previously was 3.4 x 2.44.2 cm, 3 mm right middle lobe nodule, chronic left lower  lobe nodule 3 mm      07/31/2015 Imaging    CT chest :similar to slight decrease in the sub-solitary pulmonary nodule (2.3 x 1.7 cm was previously 2.7 x 2.2 cm) in the right upper and middle lobes, treatment-related changes, unchanged pulmonary nodules in right and left lungs      02/15/2016 PET scan    2.0 x 1.5 cm spiculated RML nodule. Hypermetabolic subpleural nodularity along the lateral right chest wall, suspicious for pleural-based metastasis. 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 She is currently on surveillance. Previously she did not want any therapy and therefore we had not been any scans. Today she is here accompanied by her daughter-in-law who are questioning the plan. She had been doing quite well at home. Recently over the past 2 weeks she's had increasing pain in the right chest wall that radiates up into the shoulder and into the jaw. She has been otherwise eating well and is without any other  locations of pain. She does feel that the pain medicines work but only walk for a short period of time and that she is really trying to consult the pain medication so that it can last a whole month. It appears that she greets pain medication at least every 8 hours.  REVIEW OF SYSTEMS:   Constitutional: Denies fevers, chills or abnormal weight loss Eyes: Denies blurriness of vision Ears, nose, mouth, throat, and face: Denies mucositis or sore throat Respiratory: Denies cough, dyspnea or wheezes Cardiovascular: Denies palpitation, chest  discomfort Gastrointestinal:  Denies nausea, heartburn or change in bowel habits Skin: Denies abnormal skin rashes Lymphatics: Denies new lymphadenopathy or easy bruising Neurological:Denies numbness, tingling or new weaknesses Behavioral/Psych: Mood is stable, no new changes  Extremities: No lower extremity edema All other systems were reviewed with the patient and are negative.  I have reviewed the past medical history, past surgical history, social history and family history with the patient and they are unchanged from previous note.  ALLERGIES:  has No Known Allergies.  MEDICATIONS:  Current Outpatient Prescriptions  Medication Sig Dispense Refill  . albuterol (PROVENTIL) (2.5 MG/3ML) 0.083% nebulizer solution Take 3 mLs (2.5 mg total) by nebulization every 6 (six) hours as needed for wheezing or shortness of breath. 75 mL 12  . anastrozole (ARIMIDEX) 1 MG tablet TAKE 1 TABLET (1 MG TOTAL) BY MOUTH DAILY. 90 tablet 3  . aspirin 81 MG tablet Take 1 tablet (81 mg total) by mouth daily.    Marland Kitchen atorvastatin (LIPITOR) 40 MG tablet Take 1 tablet (40 mg total) by mouth daily. 90 tablet 0  . Cholecalciferol (VITAMIN D) 2000 UNITS CAPS Take 1 capsule by mouth daily.     . cholestyramine (QUESTRAN) 4 g packet Take 1 packet (4 g total) by mouth 2 (two) times daily. 180 each 3  . citalopram (CELEXA) 20 MG tablet     . ibuprofen (ADVIL,MOTRIN) 200 MG tablet Take 400 mg by mouth 3 (three) times daily.    Marland Kitchen LORazepam (ATIVAN) 0.5 MG tablet Take 1 tablet po q hs 30 tablet 0  . magnesium oxide (MAG-OX) 400 MG tablet Take 400 mg by mouth daily.    . methylPREDNISolone (MEDROL DOSEPAK) 4 MG TBPK tablet Take 6 tabs po on day 1, take 5 tabs po on day 2, take 4 tabs po on day 3, take 3 tabs po on day 4, take 2 tabs po on day 5, and 1 tab po on day 6 then stop 21 tablet 0  . Multiple Vitamins-Minerals (MULTIVITAMIN GUMMIES ADULT PO) Take 2 tablets by mouth daily.     Marland Kitchen OLANZapine (ZYPREXA) 7.5 MG tablet Take  7.5 mg by mouth daily.  3  . Omega-3 Fatty Acids (FISH OIL) 1200 MG CAPS Take 1 capsule by mouth daily.    Marland Kitchen oxyCODONE-acetaminophen (PERCOCET/ROXICET) 5-325 MG tablet Take 1 tablet by mouth every 8 (eight) hours as needed for severe pain. 60 tablet 0  . pramipexole (MIRAPEX) 0.125 MG tablet Take 3 tablets (0.375 mg total) by mouth at bedtime. 90 tablet 3  . pramipexole (MIRAPEX) 0.125 MG tablet TAKE 2 TABLETS (0.25 MG TOTAL) BY MOUTH AT BEDTIME. 60 tablet 2  . Probiotic Product (PROBIOTIC DAILY PO) Take 2 tablets by mouth daily. Probiotic gummy    . saccharomyces boulardii (FLORASTOR) 250 MG capsule Take 250 mg by mouth 2 (two) times daily. Reported on 08/02/2015    . zolpidem (AMBIEN) 5 MG tablet Take 1 tablet (5 mg total) by mouth  at bedtime as needed for sleep. 30 tablet 3   No current facility-administered medications for this visit.     PHYSICAL EXAMINATION: ECOG PERFORMANCE STATUS: 1 - Symptomatic but completely ambulatory  Vitals:   11/11/16 0935  BP: (!) 118/58  Pulse: 83  Resp: 17  Temp: 97.7 F (36.5 C)   Filed Weights   11/11/16 0935  Weight: 165 lb 11.2 oz (75.2 kg)    GENERAL:alert, no distress and comfortable SKIN: skin color, texture, turgor are normal, no rashes or significant lesions EYES: normal, Conjunctiva are pink and non-injected, sclera clear OROPHARYNX:no exudate, no erythema and lips, buccal mucosa, and tongue normal  NECK: supple, thyroid normal size, non-tender, without nodularity LYMPH:  no palpable lymphadenopathy in the cervical, axillary or inguinal LUNGS: clear to auscultation and percussion with normal breathing effort HEART: regular rate & rhythm and no murmurs and no lower extremity edema ABDOMEN:abdomen soft, non-tender and normal bowel sounds MUSCULOSKELETAL:no cyanosis of digits and no clubbing  NEURO: alert & oriented x 3 with fluent speech, no focal motor/sensory deficits EXTREMITIES: No lower extremity edema  LABORATORY DATA:  I have  reviewed the data as listed   Chemistry      Component Value Date/Time   NA 143 03/17/2016 1402   K 4.3 03/17/2016 1402   CL 106 02/01/2016 0956   CO2 26 03/17/2016 1402   BUN 10.8 03/17/2016 1402   CREATININE 0.9 03/17/2016 1402      Component Value Date/Time   CALCIUM 9.7 03/17/2016 1402   ALKPHOS 96 03/17/2016 1402   AST 21 03/17/2016 1402   ALT 8 03/17/2016 1402   BILITOT 0.70 03/17/2016 1402       Lab Results  Component Value Date   WBC 5.3 03/17/2016   HGB 12.3 03/17/2016   HCT 37.3 03/17/2016   MCV 91.4 03/17/2016   PLT 85 (L) 03/17/2016   NEUTROABS 2.9 03/17/2016    ASSESSMENT & PLAN:  Cancer of lower-inner quadrant of left female breast 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 Hypermetabolic subpleural nodularity along the lateral right chest wall, suspicious for pleural-based metastasis. 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 There is not enough biopsy material for molecular testing. Guardant 360 is negative forEGFR mutation blood test. 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 However it is unclear if these mutations are the driver mutations. We do not have  any data for their use in lung cancer.  2. we will obtain a PET/CT scan for restaging. Subsequent options of treatment if she has extensive metastatic disease would be with immunotherapy with Pembrolizumab. Patient is now interested in pursuing treatments as long as there are well tolerated. Right rib pain: Completed palliative radiation 11/13/2017with excellent improvement in pain Bone metastases: Patient will need Xgeva every 3 months along with calcium and vitamin D  Worsening pain: I added  fentanyl 25 g patch and a increase the number of pain pills to 90 pills in the next month that she can take for the hydrocodone.  Return to clinic in 3 weeks to review the results of the PET scan..   I spent 25 minutes talking to the patient of which more than half was spent in counseling and coordination of care.  No orders of the defined types were placed in this encounter.  The patient has a good understanding of the overall plan. she agrees with it. she will call with any problems that may develop before the next visit here.   Rulon Eisenmenger, MD 11/11/16

## 2016-11-24 ENCOUNTER — Encounter (HOSPITAL_COMMUNITY)
Admission: RE | Admit: 2016-11-24 | Discharge: 2016-11-24 | Disposition: A | Discharge status: Home / Self Care | Payer: PPO | Source: Ambulatory Visit | Attending: Hematology and Oncology | Admitting: Hematology and Oncology

## 2016-11-24 DIAGNOSIS — C7951 Secondary malignant neoplasm of bone: Secondary | ICD-10-CM | POA: Diagnosis not present

## 2016-11-24 DIAGNOSIS — C50312 Malignant neoplasm of lower-inner quadrant of left female breast: Secondary | ICD-10-CM | POA: Insufficient documentation

## 2016-11-24 DIAGNOSIS — Z170 Estrogen receptor positive status [ER+]: Secondary | ICD-10-CM | POA: Insufficient documentation

## 2016-11-24 DIAGNOSIS — C50912 Malignant neoplasm of unspecified site of left female breast: Secondary | ICD-10-CM | POA: Diagnosis not present

## 2016-11-24 LAB — GLUCOSE, CAPILLARY: GLUCOSE-CAPILLARY: 105 mg/dL — AB (ref 65–99)

## 2016-11-24 MED ORDER — FLUDEOXYGLUCOSE F - 18 (FDG) INJECTION
7.5900 | Freq: Once | INTRAVENOUS | Status: AC | PRN
  Administered 2016-11-24: 7.59 via INTRAVENOUS

## 2016-12-01 ENCOUNTER — Other Ambulatory Visit: Payer: Self-pay

## 2016-12-01 ENCOUNTER — Other Ambulatory Visit: Payer: Self-pay | Admitting: Radiation Oncology

## 2016-12-01 DIAGNOSIS — C50312 Malignant neoplasm of lower-inner quadrant of left female breast: Secondary | ICD-10-CM

## 2016-12-01 DIAGNOSIS — Z170 Estrogen receptor positive status [ER+]: Principal | ICD-10-CM

## 2016-12-01 DIAGNOSIS — Z853 Personal history of malignant neoplasm of breast: Secondary | ICD-10-CM

## 2016-12-02 ENCOUNTER — Ambulatory Visit (HOSPITAL_BASED_OUTPATIENT_CLINIC_OR_DEPARTMENT_OTHER): Payer: PPO | Admitting: Hematology and Oncology

## 2016-12-02 ENCOUNTER — Other Ambulatory Visit (HOSPITAL_BASED_OUTPATIENT_CLINIC_OR_DEPARTMENT_OTHER): Payer: PPO

## 2016-12-02 ENCOUNTER — Encounter: Payer: Self-pay | Admitting: Hematology and Oncology

## 2016-12-02 DIAGNOSIS — Z79811 Long term (current) use of aromatase inhibitors: Secondary | ICD-10-CM

## 2016-12-02 DIAGNOSIS — C50312 Malignant neoplasm of lower-inner quadrant of left female breast: Secondary | ICD-10-CM

## 2016-12-02 DIAGNOSIS — Z85118 Personal history of other malignant neoplasm of bronchus and lung: Secondary | ICD-10-CM | POA: Diagnosis not present

## 2016-12-02 DIAGNOSIS — Z170 Estrogen receptor positive status [ER+]: Secondary | ICD-10-CM | POA: Diagnosis not present

## 2016-12-02 LAB — CBC WITH DIFFERENTIAL/PLATELET
BASO%: 0.5 % (ref 0.0–2.0)
BASOS ABS: 0 10*3/uL (ref 0.0–0.1)
EOS%: 1.1 % (ref 0.0–7.0)
Eosinophils Absolute: 0.1 10*3/uL (ref 0.0–0.5)
HEMATOCRIT: 36.6 % (ref 34.8–46.6)
HEMOGLOBIN: 12 g/dL (ref 11.6–15.9)
LYMPH#: 0.9 10*3/uL (ref 0.9–3.3)
LYMPH%: 12.8 % — ABNORMAL LOW (ref 14.0–49.7)
MCH: 29.2 pg (ref 25.1–34.0)
MCHC: 32.8 g/dL (ref 31.5–36.0)
MCV: 89.1 fL (ref 79.5–101.0)
MONO#: 3.3 10*3/uL — AB (ref 0.1–0.9)
MONO%: 46 % — ABNORMAL HIGH (ref 0.0–14.0)
NEUT#: 2.4 10*3/uL (ref 1.5–6.5)
NEUT%: 39.6 % (ref 38.4–76.8)
PLATELETS: 80 10*3/uL — AB (ref 145–400)
RBC: 4.11 10*6/uL (ref 3.70–5.45)
RDW: 14.5 % (ref 11.2–14.5)
WBC: 6.6 10*3/uL (ref 3.9–10.3)

## 2016-12-02 LAB — TECHNOLOGIST REVIEW

## 2016-12-02 LAB — COMPREHENSIVE METABOLIC PANEL
ALBUMIN: 3.7 g/dL (ref 3.5–5.0)
ALK PHOS: 108 U/L (ref 40–150)
ALT: 13 U/L (ref 0–55)
ANION GAP: 10 meq/L (ref 3–11)
AST: 23 U/L (ref 5–34)
BILIRUBIN TOTAL: 1.11 mg/dL (ref 0.20–1.20)
BUN: 7.4 mg/dL (ref 7.0–26.0)
CALCIUM: 9.5 mg/dL (ref 8.4–10.4)
CHLORIDE: 102 meq/L (ref 98–109)
CO2: 27 mEq/L (ref 22–29)
CREATININE: 0.8 mg/dL (ref 0.6–1.1)
EGFR: 77 mL/min/{1.73_m2} — ABNORMAL LOW (ref >90)
Glucose: 115 mg/dl (ref 70–140)
Potassium: 3.8 mEq/L (ref 3.5–5.1)
Sodium: 138 mEq/L (ref 136–145)
Total Protein: 7.6 g/dL (ref 6.4–8.3)

## 2016-12-02 MED ORDER — FENTANYL 25 MCG/HR TD PT72: 25.0000 ug | MEDICATED_PATCH | TRANSDERMAL | 0 refills | Status: DC

## 2016-12-02 NOTE — Assessment & Plan Note (Signed)
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 Hypermetabolic subpleural nodularity along the lateral right chest wall, suspicious for pleural-based metastasis. 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 There is not enough biopsy material for molecular testing. Guardant 360 is negative forEGFR mutation blood test. 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 However it is unclear if these mutations are the driver mutations. We do not have any data for their use in lung cancer.  2. with evidence of metastatic disease into the ribs in Chest wall, I discussed the role of single agent carboplatin given every 3 weeks +/- Taxol After much discussion, patient decided to not received any further chemotherapy.   Right rib pain: Completed palliative radiation 11/13/2017with excellent improvement in pain Bone metastases: Patient will need Xgeva every 3 months along with calcium and vitamin D  PET/CT scan 11/24/2016: Post radiation changes in both the lungs no new metastases, stable left parotid lesion, no residual hypermetabolic activity within the right anterior chest wall, healing fractures right fifth and  sixth ribs  Based on the above PET scan results there is no indication for treatment. We will continue to watch and monitor.

## 2016-12-02 NOTE — Progress Notes (Signed)
Patient Care Team: Elby Showers, MD as PCP - General (Internal Medicine) Nicholas Lose, MD as Consulting Physician (Hematology and Oncology) Erroll Luna, MD as Consulting Physician (General Surgery) Thea Silversmith, MD (Inactive) as Consulting Physician (Radiation Oncology)  DIAGNOSIS:  Encounter Diagnosis  Name Primary?  . Malignant neoplasm of lower-inner quadrant of left breast in female, estrogen receptor positive ([place])     SUMMARY OF ONCOLOGIC HISTORY:   Cancer of lower-inner quadrant of left female breast ([place])   01/05/2014 Initial Diagnosis    Ultrasound-guided core needle biopsy: Cancer of lower-inner quadrant of left female breast, ER 100%, PR 90%, Ki-67: 30%, HER-2 negative (Ratio 1.02), grade 2      01/13/2014 Breast MRI    1.6 cm left breast abnormality, no abnormal lymph nodes.      02/09/2014 Surgery    Left breast lumpectomy: Invasive grade 2 ductal carcinoma 1.5 cm with low-grade grade DCIS posterior aspect of the inferior margin close (0.15 cm), 3 SLN negative, Oncotype DX recurrence score 9 low risk (7% ROR)      03/13/2014 - 05/02/2014 Radiation Therapy    Adjuvant radiation therapy      06/02/2014 -  Anti-estrogen oral therapy    Arimidex 1 mg daily, plan is for 5 years of treatment      12/19/2014 Initial Biopsy    Left upper arm soft tissue biopsy: Fat necrosis and fibrosis no evidence of malignancy      11/24/2016 PET scan    Radiation changes in both lungs, no evidence of recurrence, stable left paratracheal lesion       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 ([place])   11/10/2014 Initial Diagnosis    Right lung biopsy adenocarcinoma TTF-1 strong diffuse expression, CK 7 strong diffuse expression GCDFP negative; estrogen receptor weak      12/13/2014 - 01/19/2015 Radiation Therapy    Right upper/middle lobe of lung, 2.6 Gy/frac for total of 70.2 Gy (definitive radiation)      03/12/2015 Imaging    Right middle and upper lobe spiculated  groundglass mass measures 2.7 x 2.23.3 cm previously was 3.4 x 2.44.2 cm, 3 mm right middle lobe nodule, chronic left lower lobe nodule 3 mm      07/31/2015 Imaging    CT chest :similar to slight decrease in the sub-solitary pulmonary nodule (2.3 x 1.7 cm was previously 2.7 x 2.2 cm) in the right upper and middle lobes, treatment-related changes, unchanged pulmonary nodules in right and left lungs      02/15/2016 PET scan    2.0 x 1.5 cm spiculated RML nodule. Hypermetabolic subpleural nodularity along the lateral right chest wall, suspicious for pleural-based metastasis. 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 Patient underwent radiation therapy to the rib. She has not received systemic chemotherapy by her preference. She is here after undergoing a PET/CT scan and evaluation. The PET/CT does not show any evidence of distant metastatic disease. She has been otherwise doing quite well. She does have some shortness of breath to minimal exertion. Complains of pain in the ribs behind the right breast.  REVIEW OF SYSTEMS:   Constitutional: Denies fevers, chills or abnormal weight loss Eyes: Denies blurriness of vision Ears, nose, mouth, throat, and face: Denies mucositis or sore throat Respiratory: Intermittent cough Cardiovascular: Denies palpitation, chest discomfort Gastrointestinal:  Denies nausea, heartburn or change in bowel habits Skin: Denies abnormal skin rashes Lymphatics: Denies new lymphadenopathy or easy bruising Neurological:Denies numbness, tingling or new  weaknesses Behavioral/Psych: Mood is stable, no new changes  Extremities: No lower extremity edema  All other systems were reviewed with the patient and are negative.  I have reviewed the past medical history, past surgical history, social history and family history with the patient and they are unchanged from previous note.  ALLERGIES:  has No Known Allergies.  MEDICATIONS:  Current Outpatient Prescriptions  Medication Sig Dispense Refill  . albuterol (PROVENTIL) (2.5 MG/3ML) 0.083% nebulizer solution Take 3 mLs (2.5 mg total) by nebulization every 6 (six) hours as needed for wheezing or shortness of breath. 75 mL 12  . anastrozole (ARIMIDEX) 1 MG tablet TAKE 1 TABLET (1 MG TOTAL) BY MOUTH DAILY. 90 tablet 3  . aspirin 81 MG tablet Take 1 tablet (81 mg total) by mouth daily.    Marland Kitchen atorvastatin (LIPITOR) 40 MG tablet Take 1 tablet (40 mg total) by mouth daily. 90 tablet 0  . Cholecalciferol (VITAMIN D) 2000 UNITS CAPS Take 1 capsule by mouth daily.     . cholestyramine (QUESTRAN) 4 g packet Take 1 packet (4 g total) by mouth 2 (two) times daily. 180 each 3  . citalopram (CELEXA) 20 MG tablet     . fentaNYL (DURAGESIC - DOSED MCG/HR) 25 MCG/HR patch Place 1 patch (25 mcg total) onto the skin every 3 (three) days. 10 patch 0  . ibuprofen (ADVIL,MOTRIN) 200 MG tablet Take 400 mg by mouth 3 (three) times daily.    Marland Kitchen LORazepam (ATIVAN) 0.5 MG tablet Take 1 tablet po q hs 30 tablet 0  . magnesium oxide (MAG-OX) 400 MG tablet Take 400 mg by mouth daily.    . Multiple Vitamins-Minerals (MULTIVITAMIN GUMMIES ADULT PO) Take 2 tablets by mouth daily.     Marland Kitchen OLANZapine (ZYPREXA) 7.5 MG tablet Take 7.5 mg by mouth daily.  3  . Omega-3 Fatty Acids (FISH OIL) 1200 MG CAPS Take 1 capsule by mouth daily.    Marland Kitchen oxyCODONE-acetaminophen (PERCOCET/ROXICET) 5-325 MG tablet Take 1 tablet by mouth every 6 (six) hours as needed for severe pain. 90 tablet 0  . pramipexole (MIRAPEX) 0.125 MG tablet Take 3 tablets  (0.375 mg total) by mouth at bedtime. 90 tablet 3  . Probiotic Product (PROBIOTIC DAILY PO) Take 2 tablets by mouth daily. Probiotic gummy    . saccharomyces boulardii (FLORASTOR) 250 MG capsule Take 250 mg by mouth 2 (two) times daily. Reported on 08/02/2015    . zolpidem (AMBIEN) 5 MG tablet Take 1 tablet (5 mg total) by mouth at bedtime as needed for sleep. 30 tablet 3   No current facility-administered medications for this visit.     PHYSICAL EXAMINATION: ECOG PERFORMANCE STATUS: 1 - Symptomatic but completely ambulatory  Vitals:   12/02/16 1509  BP: 125/70  Pulse: (!) 109  Temp: (!) 100.7 F (38.2 C)   Filed Weights   12/02/16 1509  Weight: 159 lb 11.2 oz (72.4 kg)    GENERAL:alert, no distress and comfortable SKIN: skin color, texture, turgor are normal, no rashes or significant lesions EYES: normal, Conjunctiva are pink and non-injected, sclera clear OROPHARYNX:no exudate, no erythema and lips,  buccal mucosa, and tongue normal  NECK: supple, thyroid normal size, non-tender, without nodularity LYMPH:  no palpable lymphadenopathy in the cervical, axillary or inguinal LUNGS: clear to auscultation and percussion with normal breathing effort HEART: regular rate & rhythm and no murmurs and no lower extremity edema ABDOMEN:abdomen soft, non-tender and normal bowel sounds MUSCULOSKELETAL:no cyanosis of digits and no clubbing  NEURO: alert & oriented x 3 with fluent speech, no focal motor/sensory deficits EXTREMITIES: No lower extremity edema  LABORATORY DATA:  I have reviewed the data as listed   Chemistry      Component Value Date/Time   NA 143 03/17/2016 1402   K 4.3 03/17/2016 1402   CL 106 02/01/2016 0956   CO2 26 03/17/2016 1402   BUN 10.8 03/17/2016 1402   CREATININE 0.9 03/17/2016 1402      Component Value Date/Time   CALCIUM 9.7 03/17/2016 1402   ALKPHOS 96 03/17/2016 1402   AST 21 03/17/2016 1402   ALT 8 03/17/2016 1402   BILITOT 0.70 03/17/2016 1402        Lab Results  Component Value Date   WBC 6.6 12/02/2016   HGB 12.0 12/02/2016   HCT 36.6 12/02/2016   MCV 89.1 12/02/2016   PLT 80 (L) 12/02/2016   NEUTROABS 2.4 12/02/2016    ASSESSMENT & PLAN:  Cancer of lower-inner quadrant of left female breast 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 based on CT scan done on 10/11/2014, no mediastinal or hilar lymphadenopathy by CT findings, T2 N0 M0 stage IB Status post definitive radiation therapy completed 01/19/2015 Patient is not a candidate for chemotherapy because of her performance status  CT chest 03/12/2015 Right middle and upper lobe spiculated groundglass mass measures 2.7 x 2.23.3 cm previously was 3.4 x 2.44.2 cm, 3 mm right middle lobe nodule, chronic left lower lobe nodule 3 mm  PET/CT scan 02/15/2016: 2.0 x 1.5 cm spiculated RML nodule. Hypermetabolic subpleural nodularity along the lateral right chest wall, suspicious for pleural-based metastasis. 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 There is not enough biopsy material for molecular testing. Guardant 360 is negative forEGFR mutation blood test. 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 However it is unclear if these mutations are the driver mutations. We do not have any data for their use in lung cancer.  2. with evidence of metastatic disease into the ribs in Chest wall, I discussed the role of single agent carboplatin given every 3 weeks +/- Taxol After much discussion, patient decided to not received any further chemotherapy.   Right rib pain: Completed palliative radiation 11/13/2017with excellent improvement in pain Bone metastases: Since there is no evidence of  active metastatic disease in the bones, I did not initiate Xgeva.  PET/CT scan 11/24/2016: Post radiation changes in both the lungs no new metastases, stable left parotid lesion, no residual hypermetabolic activity within the right anterior chest wall, healing fractures right fifth and sixth ribs  Based on the above PET scan results there is no indication for treatment. We will continue to watch and monitor. Return to clinic in 6 months for follow-up. Our plans to do PET/CT scan annually.  I spent 25 minutes talking to the patient of which more than half was spent in counseling and coordination of care.  No orders of the defined types were placed in this encounter.  The  patient has a good understanding of the overall plan. she agrees with it. she will call with any problems that may develop before the next visit here.   Rulon Eisenmenger, MD 12/02/16

## 2016-12-16 ENCOUNTER — Telehealth: Payer: Self-pay | Admitting: Radiation Oncology

## 2016-12-16 NOTE — Telephone Encounter (Signed)
Faxed order signed by Freeman Caldron, PA-C to [facility]. Confirmation fax of delivery obtained.

## 2016-12-31 ENCOUNTER — Ambulatory Visit
Admission: RE | Admit: 2016-12-31 | Discharge: 2016-12-31 | Disposition: A | Discharge status: Home / Self Care | Payer: PPO | Source: Ambulatory Visit | Attending: Radiation Oncology | Admitting: Radiation Oncology

## 2016-12-31 DIAGNOSIS — R928 Other abnormal and inconclusive findings on diagnostic imaging of breast: Secondary | ICD-10-CM | POA: Diagnosis not present

## 2016-12-31 DIAGNOSIS — Z853 Personal history of malignant neoplasm of breast: Secondary | ICD-10-CM

## 2017-02-03 ENCOUNTER — Encounter: Payer: Self-pay | Admitting: Internal Medicine

## 2017-02-03 ENCOUNTER — Ambulatory Visit (INDEPENDENT_AMBULATORY_CARE_PROVIDER_SITE_OTHER): Payer: PPO | Admitting: Internal Medicine

## 2017-02-03 VITALS — BP 100/62 | HR 89 | Temp 97.9°F | Ht 65.0 in | Wt 159.0 lb

## 2017-02-03 DIAGNOSIS — Z23 Encounter for immunization: Secondary | ICD-10-CM | POA: Diagnosis not present

## 2017-02-03 DIAGNOSIS — J449 Chronic obstructive pulmonary disease, unspecified: Secondary | ICD-10-CM

## 2017-02-03 DIAGNOSIS — C7951 Secondary malignant neoplasm of bone: Secondary | ICD-10-CM

## 2017-02-03 DIAGNOSIS — E785 Hyperlipidemia, unspecified: Secondary | ICD-10-CM | POA: Diagnosis not present

## 2017-02-03 DIAGNOSIS — F259 Schizoaffective disorder, unspecified: Secondary | ICD-10-CM

## 2017-02-03 DIAGNOSIS — D0512 Intraductal carcinoma in situ of left breast: Secondary | ICD-10-CM

## 2017-02-03 DIAGNOSIS — C342 Malignant neoplasm of middle lobe, bronchus or lung: Secondary | ICD-10-CM

## 2017-02-03 DIAGNOSIS — Z0000 Encounter for general adult medical examination without abnormal findings: Secondary | ICD-10-CM | POA: Diagnosis not present

## 2017-02-03 DIAGNOSIS — K529 Noninfective gastroenteritis and colitis, unspecified: Secondary | ICD-10-CM

## 2017-02-03 LAB — POCT URINALYSIS DIPSTICK
BILIRUBIN UA: NEGATIVE
GLUCOSE UA: NEGATIVE
Ketones, UA: NEGATIVE
Leukocytes, UA: NEGATIVE
NITRITE UA: NEGATIVE
PH UA: 6 (ref 5.0–8.0)
PROTEIN UA: NEGATIVE
RBC UA: NEGATIVE
Spec Grav, UA: 1.025 (ref 1.010–1.025)
Urobilinogen, UA: 0.2 E.U./dL

## 2017-02-03 LAB — CBC WITH DIFFERENTIAL/PLATELET
Basophils Absolute: 0 cells/uL (ref 0–200)
Basophils Relative: 0 %
Eosinophils Absolute: 46 cells/uL (ref 15–500)
Eosinophils Relative: 1 %
HCT: 38.9 % (ref 35.0–45.0)
Hemoglobin: 12.6 g/dL (ref 11.7–15.5)
LYMPHS PCT: 23 %
Lymphs Abs: 1058 cells/uL (ref 850–3900)
MCH: 28.8 pg (ref 27.0–33.0)
MCHC: 32.4 g/dL (ref 32.0–36.0)
MCV: 89 fL (ref 80.0–100.0)
MONOS PCT: 21 %
MPV: 10.4 fL (ref 7.5–12.5)
Monocytes Absolute: 966 cells/uL — ABNORMAL HIGH (ref 200–950)
NEUTROS ABS: 2530 {cells}/uL (ref 1500–7800)
Neutrophils Relative %: 55 %
Platelets: 68 10*3/uL — ABNORMAL LOW (ref 140–400)
RBC: 4.37 MIL/uL (ref 3.80–5.10)
RDW: 15.3 % — AB (ref 11.0–15.0)
WBC: 4.6 10*3/uL (ref 3.8–10.8)

## 2017-02-03 NOTE — Patient Instructions (Signed)
It was a pleasure to see you today. Flu vaccine given. Labs drawn and pending. Continue same medications and return in one year.

## 2017-02-03 NOTE — Progress Notes (Signed)
Subjective:    Patient ID: Summer Williams, female    DOB: [birth year], 71 y.o.   MRN: [id number]  HPI   71 year old White Female for health maintenance exam, evaluation of medical issues and Medicare Wellness.   Hx COPD  which is stable and under good control, hyperlipidemia, lung cancer. Hx schizoaffective disorder which is stable. Will take flu vaccine today.  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 3 lymph nodes were negative. Recurrence thought to be low risk. Tumor was ER positive PR positive HER-2 negative. She received adjuvant radiation therapy after surgery and was placed on Arimidex for 5 years.  In 2016 was diagnosed with right lung adenocarcinoma. She had nodules in the right middle and lower lobe. Tumor was staged as T2 N0M0 and she underwent radiation therapy.   Had PET scan in September 2017 showing hypermetabolic subpleural nodularity along the lateral right chest wall suspicious for pleural based metastasis. There was also a 2 x 1.5 cm spiculated right middle lobe nodule. There was a suspected bone metastases 7th rib. Had palliative radiation to rib in November 2017.  She has declined systemic chemotherapy.  Recent PET scan this Summer did not show any evidence of distant metastatic disease. She's been doing well except for shortness of breath with minimal exertion. She is followed by Dr. Lindi Adie, Oncologist.  Still drives. Ambulates slowly. Has not fallen.  History of chronic diarrhea that responded to Questran. She had colonoscopy by Dr. Deatra Ina June 2016 showing diverticulosis and internal hemorrhoids but no polyps or tumors. Screening for celiac disease was negative.  She is followed by Dr. Annamaria Boots for COPD.  History of hyperlipidemia treated with statin. History of elevated liver enzymes with workup showing fatty liver. She had ultrasound of the  upper abdomen November 2012 showing mild diffuse fatty liver. Not really motivated to diet and exercise.  History of vitamin D deficiency. Had cholecystectomy 2003 in [place]. Cataract extraction of the right eye by Dr. Kathrin Penner in 2005. She uses home oxygen at night. Has prescription for inhalers.  History of alcohol abuse and remote past.  No known drug allergies.  Social and Family history: She moved here from [place] ,[place] to be near family. She previously worked as a Education officer, museum. She smoked up to a pack of cigarettes daily for many years but quit smoking in early 2007 after smoking for some 50 years. Last drank alcohol in 1980. She is divorced. She previously resided with her mother until her mother died in 57. Her mother had a fall, was hospitalized and had failure to thrive and was placed in hospice therapy. Mother had dementia. Mother was in her 37s when she passed away. Patient has a son and daughter-in-law who reside here in [place]. Father died at age 43 of cardiac arrest. She says father had history of epilepsy. Grandmother died of suicide. Both grandfathers with history of cancer.                                        Review of Systems  Constitutional: Negative.   HENT: Negative.   Respiratory: Positive for shortness of breath.   Cardiovascular: Negative.   Gastrointestinal: Negative.   Genitourinary: Negative.   Musculoskeletal: Negative.   Neurological: Negative.   Psychiatric/Behavioral:  Negative.        Objective:   Physical Exam  Constitutional: She appears well-developed and well-nourished.  Cardiovascular: Normal rate, regular rhythm and normal heart sounds.   Pulmonary/Chest:  Declines breast exam  Genitourinary:  Genitourinary Comments: Pt declines bimanual  Skin: Skin is warm and dry.  Psychiatric: She has a normal mood and affect. Her behavior is normal. Thought content normal.  Vitals reviewed.          Assessment & Plan:  History of lung cancer treated with radiation followed by oncology  COPD-stable followed by Dr. 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  Subjective:   Patient presents for Medicare Annual/Subsequent preventive examination.  Review Past Medical/Family/Social:See above   Risk Factors  Current exercise habits: Ambulates slowly-doesn't exercise Dietary issues discussed: Low fat low carbohydrate  Cardiac risk factors:Hyperlipidemia  Depression Screen  (Note: if answer to either of the following is "Yes", a more complete depression screening is indicated)   Over the past two weeks, have you felt down, depressed or hopeless? No  Over the past two weeks, have you felt little interest or pleasure in doing things? No Have you lost interest or pleasure in daily life? No Do you often feel hopeless? No Do you cry easily over simple problems? No   Activities of Daily Living  In your present state of health, do you have any difficulty performing the following activities?:   Driving? No  Managing money? No  Feeding yourself? No  Getting from bed to chair? No  Climbing a flight of stairs? No  Preparing food and eating?: No  Bathing or showering? No  Getting dressed: No  Getting to the toilet? No  Using the toilet:No  Moving around from place to place: No  In the past year have you fallen or had a near fall?:No  Are you sexually active? No  Do you have more than one partner? No   Hearing Difficulties: No  Do you often ask people to speak up or repeat themselves? No  Do you experience ringing or noises in your ears? No  Do you have difficulty understanding soft or whispered voices?Yes Do you feel that you have a problem with memory? No Do you  often misplace items? No    Home Safety:  Do you have a smoke alarm at your residence? Yes Do you have grab bars in the bathroom?No Do you have throw rugs in your house? No   Cognitive Testing  Alert? Yes Normal Appearance?Yes  Oriented to person? Yes Place? Yes  Time? Yes  Recall of three objects? Yes  Can perform simple calculations? Yes  Displays appropriate judgment?Yes  Can read the correct time from a watch face?Yes   List the Names of Other Physician/Practitioners you currently use:  See referral list for the physicians patient is currently seeing.     Review of Systems: See above   Objective:     General appearance: Appears stated age and mildly obese  Head: Normocephalic, without obvious abnormality, atraumatic  Eyes: conj clear, EOMi PEERLA  Ears: normal TM's and external ear canals both ears  Nose: Nares normal. Septum midline. Mucosa normal. No drainage or sinus tenderness.  Throat: lips, mucosa, and tongue normal; teeth and gums normal  Neck: no adenopathy, no carotid bruit, no  JVD, supple, symmetrical, trachea midline and thyroid not enlarged, symmetric, no tenderness/mass/nodules  No CVA tenderness.  Lungs: clear to auscultation bilaterally  Breasts: normal appearance, no masses or tenderness Heart: regular rate and rhythm, S1, S2 normal, no murmur, click, rub or gallop  Abdomen: soft, non-tender; bowel sounds normal; no masses, no organomegaly  Musculoskeletal: ROM normal in all joints, no crepitus, no deformity, Normal muscle strengthen. Back  is symmetric, no curvature. Skin: Skin color, texture, turgor normal. No rashes or lesions  Lymph nodes: Cervical, supraclavicular, and axillary nodes normal.  Neurologic: CN 2 -12 Normal, Normal symmetric reflexes. Normal coordination and gait  Psych: Alert & Oriented x 3, Mood appear stable.    Assessment:    Annual wellness medicare exam   Plan:    During the course of the visit the patient was educated  and counseled about appropriate screening and preventive services including:  Had mammogram August 2018  Recommend flu vaccine     Patient Instructions (the written plan) was given to the patient.  Medicare Attestation  I have personally reviewed:  The patient's medical and social history  Their use of alcohol, tobacco or illicit drugs  Their current medications and supplements  The patient's functional ability including ADLs,fall risks, home safety risks, cognitive, and hearing and visual impairment  Diet and physical activities  Evidence for depression or mood disorders  The patient's weight, height, BMI, and visual acuity have been recorded in the chart. I have made referrals, counseling, and provided education to the patient based on review of the above and I have provided the patient with a written personalized care plan for preventive services.

## 2017-02-04 LAB — LIPID PANEL
CHOL/HDL RATIO: 2.2 ratio (ref <5.0)
Cholesterol: 143 mg/dL (ref <200)
HDL: 65 mg/dL (ref >50)
LDL CALC: 49 mg/dL (ref <100)
TRIGLYCERIDES: 144 mg/dL (ref <150)
VLDL: 29 mg/dL (ref <30)

## 2017-02-04 LAB — COMPLETE METABOLIC PANEL WITH GFR
ALT: 16 U/L (ref 6–29)
AST: 22 U/L (ref 10–35)
Albumin: 4.4 g/dL (ref 3.6–5.1)
Alkaline Phosphatase: 95 U/L (ref 33–130)
BILIRUBIN TOTAL: 0.7 mg/dL (ref 0.2–1.2)
BUN: 8 mg/dL (ref 7–25)
CHLORIDE: 106 mmol/L (ref 98–110)
CO2: 25 mmol/L (ref 20–32)
Calcium: 9.3 mg/dL (ref 8.6–10.4)
Creat: 0.71 mg/dL (ref 0.60–0.93)
GFR, EST NON AFRICAN AMERICAN: 85 mL/min (ref >=60)
Glucose, Bld: 94 mg/dL (ref 65–99)
POTASSIUM: 4 mmol/L (ref 3.5–5.3)
Sodium: 144 mmol/L (ref 135–146)
Total Protein: 6.8 g/dL (ref 6.1–8.1)

## 2017-02-04 LAB — TSH: TSH: 4.01 mIU/L

## 2017-02-14 ENCOUNTER — Other Ambulatory Visit: Payer: Self-pay | Admitting: Gastroenterology

## 2017-02-16 ENCOUNTER — Other Ambulatory Visit: Payer: Self-pay

## 2017-02-16 DIAGNOSIS — C7951 Secondary malignant neoplasm of bone: Secondary | ICD-10-CM

## 2017-02-16 MED ORDER — OXYCODONE-ACETAMINOPHEN 5-325 MG PO TABS: 1.0000 | ORAL_TABLET | Freq: Four times a day (QID) | ORAL | 0 refills | Status: DC | PRN

## 2017-02-16 NOTE — Telephone Encounter (Signed)
Pt called requesting pain medication refill for her percocet. Told pt that she may come and pick it up tomorrow. Pt states that she will be in before noon tomorrow to pick up prescription. Thankful for the call and no further questions or concerns at this time.

## 2017-02-24 ENCOUNTER — Ambulatory Visit: Payer: PPO | Admitting: Hematology and Oncology

## 2017-02-27 ENCOUNTER — Ambulatory Visit: Payer: PPO | Admitting: Neurology

## 2017-05-05 ENCOUNTER — Ambulatory Visit (INDEPENDENT_AMBULATORY_CARE_PROVIDER_SITE_OTHER): Payer: PPO | Admitting: Internal Medicine

## 2017-05-05 ENCOUNTER — Encounter: Payer: Self-pay | Admitting: Internal Medicine

## 2017-05-05 ENCOUNTER — Ambulatory Visit: Payer: PPO | Admitting: Internal Medicine

## 2017-05-05 VITALS — BP 118/74 | HR 108 | Ht 65.0 in | Wt 166.4 lb

## 2017-05-05 DIAGNOSIS — J449 Chronic obstructive pulmonary disease, unspecified: Secondary | ICD-10-CM | POA: Diagnosis not present

## 2017-05-05 DIAGNOSIS — J9611 Chronic respiratory failure with hypoxia: Secondary | ICD-10-CM

## 2017-05-05 MED ORDER — ARFORMOTEROL TARTRATE 15 MCG/2ML IN NEBU
INHALATION_SOLUTION | RESPIRATORY_TRACT | 6 refills | Status: DC
Start: 2017-05-05 — End: 2017-06-25

## 2017-05-05 NOTE — Assessment & Plan Note (Signed)
She has home O2 for sleep and now qualifies for portable.  She is interested in [place] but will discuss options with DME for 2-3 L during exertion, pulse.

## 2017-05-05 NOTE — Patient Instructions (Addendum)
Order- DME [facility]/ [facility]- please add portable O2 2L/ min pulse   Dx chronic hypoxic respiratory failure, COPD mixed type  Order- DME [facility]- script printed for Brovana neb solution. Already has nebulizer machine   Dx COPD mixed type  Please call if I can help

## 2017-05-05 NOTE — Progress Notes (Signed)
HPI female former smoker followed for COPD/chronic bronchitis severe, Adeno Ca/ lung nodules, complicated by history left breast cancer/XRT, history schizoaffective disorder a1AT normal 158 no phenotype 6 minute walk test 08/30/2012-92%, 87%, 94%, 297 m. Significant desaturation with exercise. PFT: 08/30/2012- severe obstructive airways disease with response to bronchodilator, air trapping, diffusion severely reduced. FVC 1.96/71%, FEV1 0.89/45%, FEV1/FVC 0.45. RV 134%, DLCO 47%. PFT: 11/02/2014-severe obstructive airways disease. FEV1 0.90/39% with significant response to bronchodilator. Walk Test room air 05/05/16- desaturation to 87%- declines portable oxygen Walk test on room air 07/04/2015-very transient dip to 88% but otherwise not in range to qualify for exercise O2 CT chest 02/06/2016--Progressive nodular soft tissue in the right middle and lower lobe, with increased solid component when compared to the prior study, suggesting recurrent tumor PET scan 02/15/16-  2.0 x 1.5 cm spiculated right middle lobe nodule, corresponding to known primary bronchogenic neoplasm. Hypermetabolic subpleural nodularity along the lateral right chest wall, suspicious for pleural-based metastasis. Associated hypermetabolism of the overlying chest wall musculature is worrisome for metastatic involvement. Suspected osseous metastasis involving the right anterolateral 7th rib. 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 Walk Test room air 05/05/17-resting saturation 93%/HR 106, lap 2-saturation 88%/HR 139.  Stopped and placed on 2 L O2 saturation 95%. --------------------------------------------------------------------------------------------------------- 05/05/2016-71 year old female former smoker followed for COPD/chronic bronchitis severe, Adeno Ca/ lung nodules, complicated by history left breast cancer/XRT, history  schizoaffective disorder O2 2 L sleep/APS FOLLOW FOR: yearly check-up sob,wheezing getting worse.Has lung cancer, had raditation twice once in the lung, once in  the, rib. Dr,Manning said she doing well. Pt.wasn't sure if you knew she has lung cancer. Finished XRT for adenocarcinoma right lung. She chose not to have chemotherapy. Previously diagnosed left breast cancer treated with lumpectomy/XRT and hormones. Easier DOE with little wheeze or cough. Breathing does not wake her. Continues oxygen 2 L for sleep. , recovered with rest and oxygen. She declines portable oxygen at this time. She says several different inhalers, including Anoro, have made no difference that she could tell. CT chest 02/06/2016 IMPRESSION: Progressive nodular soft tissue in the right middle and lower lobe, with increased solid component when compared to the prior study, suggesting recurrent tumor. Additional new subpleural soft tissue along the lateral right lung. Consider PET-CT for further evaluation as clinically warranted. Scattered bilateral pulmonary nodules measuring up to 6 mm, relatively unchanged since 2016, favoring a benign etiology. Small mediastinal lymph nodes measuring up to 8 mm short axis, favored to be reactive. PET scan 02/15/16-  Degenerative changes involving the visualized thoracolumbar spine. IMPRESSION: 2.0 x 1.5 cm spiculated right middle lobe nodule, corresponding to known primary bronchogenic neoplasm. Hypermetabolic subpleural nodularity along the lateral right chest wall, suspicious for pleural-based metastasis. Associated hypermetabolism of the overlying chest wall musculature is worrisome for metastatic involvement. Suspected osseous metastasis involving the right anterolateral 7th rib. 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 Additional ancillary findings as above. Electronically Signed    By: Julian Hy M.D.   On: 02/15/2016 12:59  05/05/17- 71 year old female former smoker followed for COPD/chronic bronchitis severe, Adeno Ca/ lung nodules, complicated by history left breast cancer/XRT, history schizoaffective disorder ---O2 2 L sleep/APS            Son here COPD mixed type; Pt states she notices she is sleeping more during the day and has increased SOB during the day. Pt  has O2 at night and has had to use O2 during the day as well at times.  Walk Test room air 05/05/17-resting saturation 93%/HR 106, lap 2-saturation 88%/HR 139.  Stopped and placed on 2 L O2 saturation 95%. Breathing is comfortable at rest with wheeze only noted with exertion.  Increased to DOE. She continues active follow-up with oncology.  Mild pressure/ache at times right lateral chest wall without other chest pain. She complains that nebulizer with albuterol only last 15-30 minutes after treatment.  No palpitation. PET scan 11/24/16- IMPRESSION: 1. Suspected evolving radiation changes in both lungs. Associated hypermetabolic activity is ill-defined and low level. Chest CT follow-up in 6 months suggested. 2. No focal hypermetabolic activity to suggest local recurrence or new metastases. 3. 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 HEENT:   No-  headaches, difficulty swallowing, tooth/dental problems, sore throat,       No-  sneezing, itching, ear ache, nasal congestion, post nasal drip,  CV:  +  chest pain, orthopnea, PND, swelling in lower extremities, anasarca, dizziness, palpitations Resp: +  shortness of breath with exertion or at rest.                productive cough,  + non-productive cough,  No- coughing up of blood.                 change in color of mucus.  No- wheezing.   Skin: No-   rash or lesions. GI:  No-   heartburn, indigestion,  abdominal pain, nausea, vomiting,  GU:  MS:  No-   joint pain or swelling.   Neuro-     nothing unusual Psych:  No- change in mood or affect. No depression or anxiety.  No memory loss.  OBJ- Physical Exam General- Alert, Oriented, Affect-appropriate, Distress- none acute, overweight Skin- rash-none, lesions- none, excoriation- none Lymphadenopathy- none Head- atraumatic            Eyes- Gross vision intact, PERRLA, conjunctivae and secretions clear            Ears- Hearing, canals-normal            Nose- Clear, no-Septal dev, mucus, polyps, erosion, perforation             Throat- Mallampati II , mucosa clear , drainage- none, tonsils- atrophic,  Neck- flexible , trachea midline, no stridor , thyroid nl, carotid no bruit Chest - symmetrical excursion , unlabored           Heart/CV- RRR , no murmur , no gallop  , no rub, nl s1 s2                           - JVD- none , edema- none, stasis changes- none, varices- none           Lung- +diminished, clear, unlabored, wheeze- none, No- cough , dullness-none, rub- none           Chest wall-not tender and no rub audible over right mid/lateral chest wall Abd-  Br/ Gen/ Rectal- Not done, not indicated Extrem- cyanosis- none, clubbing, none, atrophy- none, strength- nl Neuro- grossly intact to observation

## 2017-05-05 NOTE — Assessment & Plan Note (Addendum)
Little wheeze except some with exertion, stable airway control. Needs longer lasting neb therapy. Plan- try Brovana neb solution.

## 2017-05-13 ENCOUNTER — Other Ambulatory Visit: Payer: Self-pay

## 2017-05-13 DIAGNOSIS — C7951 Secondary malignant neoplasm of bone: Secondary | ICD-10-CM

## 2017-05-13 MED ORDER — OXYCODONE-ACETAMINOPHEN 5-325 MG PO TABS: 1.0000 | ORAL_TABLET | Freq: Four times a day (QID) | ORAL | 0 refills | Status: DC | PRN

## 2017-05-13 NOTE — Progress Notes (Signed)
Pt called requesting refill on her percocet medication. Told pt that she may stop by today or tomorrow to pick up her prescription.

## 2017-05-14 ENCOUNTER — Other Ambulatory Visit: Payer: Self-pay | Admitting: Internal Medicine

## 2017-05-22 DIAGNOSIS — H43813 Vitreous degeneration, bilateral: Secondary | ICD-10-CM | POA: Diagnosis not present

## 2017-05-22 DIAGNOSIS — H04123 Dry eye syndrome of bilateral lacrimal glands: Secondary | ICD-10-CM | POA: Diagnosis not present

## 2017-05-22 DIAGNOSIS — Z961 Presence of intraocular lens: Secondary | ICD-10-CM | POA: Diagnosis not present

## 2017-05-22 DIAGNOSIS — H5213 Myopia, bilateral: Secondary | ICD-10-CM | POA: Diagnosis not present

## 2017-06-04 ENCOUNTER — Other Ambulatory Visit: Payer: Self-pay | Admitting: Hematology and Oncology

## 2017-06-04 DIAGNOSIS — C50312 Malignant neoplasm of lower-inner quadrant of left female breast: Secondary | ICD-10-CM

## 2017-06-07 NOTE — Assessment & Plan Note (Signed)
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 Hypermetabolic subpleural nodularity along the lateral right chest wall, suspicious for pleural-based metastasis. 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 There is not enough biopsy material for molecular testing. Guardant 360 is negative forEGFR mutation blood test. 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 However it is unclear if these mutations are the driver mutations. We do not have any data for their use in lung cancer.  2. with evidence of metastatic disease into the ribs in Chest wall, I discussed the role of single agent carboplatin given every 3 weeks +/- Taxol After much discussion, patient decided to not received any further chemotherapy.   Right rib pain: Completed palliative radiation 11/13/2017with excellent improvement in pain Bone metastases: Patient will need Xgeva every 3 months along with calcium and vitamin D  PET/CT scan 11/24/2016: Post radiation changes in both the lungs no new metastases, stable left parotid lesion, no residual hypermetabolic activity within the right anterior chest wall, healing fractures right fifth and  sixth ribs  Mammogram Aug 2018: Neg  RTC in 6 months with PET CT

## 2017-06-08 ENCOUNTER — Telehealth: Payer: Self-pay | Admitting: Hematology and Oncology

## 2017-06-08 ENCOUNTER — Inpatient Hospital Stay: Payer: PPO | Attending: Hematology and Oncology | Admitting: Hematology and Oncology

## 2017-06-08 ENCOUNTER — Inpatient Hospital Stay: Payer: PPO

## 2017-06-08 DIAGNOSIS — Z923 Personal history of irradiation: Secondary | ICD-10-CM | POA: Insufficient documentation

## 2017-06-08 DIAGNOSIS — Z79899 Other long term (current) drug therapy: Secondary | ICD-10-CM | POA: Diagnosis not present

## 2017-06-08 DIAGNOSIS — Z170 Estrogen receptor positive status [ER+]: Principal | ICD-10-CM

## 2017-06-08 DIAGNOSIS — C50312 Malignant neoplasm of lower-inner quadrant of left female breast: Secondary | ICD-10-CM | POA: Insufficient documentation

## 2017-06-08 DIAGNOSIS — Z79811 Long term (current) use of aromatase inhibitors: Secondary | ICD-10-CM | POA: Insufficient documentation

## 2017-06-08 DIAGNOSIS — C7951 Secondary malignant neoplasm of bone: Secondary | ICD-10-CM | POA: Insufficient documentation

## 2017-06-08 DIAGNOSIS — C342 Malignant neoplasm of middle lobe, bronchus or lung: Secondary | ICD-10-CM

## 2017-06-08 DIAGNOSIS — Z7982 Long term (current) use of aspirin: Secondary | ICD-10-CM | POA: Diagnosis not present

## 2017-06-08 DIAGNOSIS — J449 Chronic obstructive pulmonary disease, unspecified: Secondary | ICD-10-CM | POA: Diagnosis not present

## 2017-06-08 LAB — CBC WITH DIFFERENTIAL/PLATELET
Abs Granulocyte: 3.3 10*3/uL (ref 1.5–6.5)
BASOS PCT: 1 %
Basophils Absolute: 0 10*3/uL (ref 0.0–0.1)
EOS ABS: 0 10*3/uL (ref 0.0–0.5)
Eosinophils Relative: 1 %
HEMATOCRIT: 37.2 % (ref 34.8–46.6)
Hemoglobin: 12.3 g/dL (ref 11.6–15.9)
Lymphocytes Relative: 15 %
Lymphs Abs: 0.9 10*3/uL (ref 0.9–3.3)
MCH: 29.9 pg (ref 25.1–34.0)
MCHC: 33 g/dL (ref 31.5–36.0)
MCV: 90.6 fL (ref 79.5–101.0)
MONOS PCT: 26 %
Monocytes Absolute: 1.5 10*3/uL — ABNORMAL HIGH (ref 0.1–0.9)
Neutro Abs: 3.3 10*3/uL (ref 1.5–6.5)
Neutrophils Relative %: 57 %
Platelets: 69 10*3/uL — ABNORMAL LOW (ref 145–400)
RBC: 4.1 MIL/uL (ref 3.70–5.45)
RDW: 14.5 % (ref 11.2–16.1)
WBC: 5.8 10*3/uL (ref 3.9–10.3)

## 2017-06-08 LAB — COMPREHENSIVE METABOLIC PANEL
ALBUMIN: 4.2 g/dL (ref 3.5–5.0)
ALT: 19 U/L (ref 0–55)
AST: 23 U/L (ref 5–34)
Alkaline Phosphatase: 94 U/L (ref 40–150)
Anion gap: 9 (ref 3–11)
BILIRUBIN TOTAL: 0.7 mg/dL (ref 0.2–1.2)
BUN: 8 mg/dL (ref 7–26)
CO2: 30 mmol/L — ABNORMAL HIGH (ref 22–29)
Calcium: 9.6 mg/dL (ref 8.4–10.4)
Chloride: 106 mmol/L (ref 98–109)
Creatinine, Ser: 0.82 mg/dL (ref 0.60–1.10)
GFR calc non Af Amer: >60 mL/min (ref >60)
GLUCOSE: 100 mg/dL (ref 70–140)
POTASSIUM: 4.3 mmol/L (ref 3.3–4.7)
Sodium: 145 mmol/L (ref 136–145)
TOTAL PROTEIN: 7.5 g/dL (ref 6.4–8.3)

## 2017-06-08 NOTE — Telephone Encounter (Signed)
Gave patient avs and calendar with appts per 1/7 los.  °

## 2017-06-08 NOTE — Progress Notes (Signed)
Patient Care Team: Elby Showers, MD as PCP - General (Internal Medicine) Nicholas Lose, MD as Consulting Physician (Hematology and Oncology) Erroll Luna, MD as Consulting Physician (General Surgery) Thea Silversmith, MD (Inactive) as Consulting Physician (Radiation Oncology)  DIAGNOSIS:  Encounter Diagnosis  Name Primary?  . Malignant neoplasm of lower-inner quadrant of left breast in female, estrogen receptor positive ([place])     SUMMARY OF ONCOLOGIC HISTORY:   Cancer of lower-inner quadrant of left female breast ([place])   01/05/2014 Initial Diagnosis    Ultrasound-guided core needle biopsy: Cancer of lower-inner quadrant of left female breast, ER 100%, PR 90%, Ki-67: 30%, HER-2 negative (Ratio 1.02), grade 2      01/13/2014 Breast MRI    1.6 cm left breast abnormality, no abnormal lymph nodes.      02/09/2014 Surgery    Left breast lumpectomy: Invasive grade 2 ductal carcinoma 1.5 cm with low-grade grade DCIS posterior aspect of the inferior margin close (0.15 cm), 3 SLN negative, Oncotype DX recurrence score 9 low risk (7% ROR)      03/13/2014 - 05/02/2014 Radiation Therapy    Adjuvant radiation therapy      06/02/2014 -  Anti-estrogen oral therapy    Arimidex 1 mg daily, plan is for 5 years of treatment      12/19/2014 Initial Biopsy    Left upper arm soft tissue biopsy: Fat necrosis and fibrosis no evidence of malignancy      11/24/2016 PET scan    Radiation changes in both lungs, no evidence of recurrence, stable left paratracheal lesion       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 ([place])   11/10/2014 Initial Diagnosis    Right lung biopsy adenocarcinoma TTF-1 strong diffuse expression, CK 7 strong diffuse expression GCDFP negative; estrogen receptor weak      12/13/2014 - 01/19/2015 Radiation Therapy    Right upper/middle lobe of lung, 2.6 Gy/frac for total of 70.2 Gy (definitive radiation)      03/12/2015 Imaging    Right middle and upper lobe spiculated  groundglass mass measures 2.7 x 2.23.3 cm previously was 3.4 x 2.44.2 cm, 3 mm right middle lobe nodule, chronic left lower lobe nodule 3 mm      07/31/2015 Imaging    CT chest :similar to slight decrease in the sub-solitary pulmonary nodule (2.3 x 1.7 cm was previously 2.7 x 2.2 cm) in the right upper and middle lobes, treatment-related changes, unchanged pulmonary nodules in right and left lungs      02/15/2016 PET scan    2.0 x 1.5 cm spiculated RML nodule. Hypermetabolic subpleural nodularity along the lateral right chest wall, suspicious for pleural-based metastasis. Associated hypermetabolism of the overlying chest wall musculature Suspected bone met 7th rib      03/31/2016 - 04/14/2016 Radiation Therapy    Palliative radiation to rib       CHIEF COMPLIANT: Follow-up of metastatic lung cancer  INTERVAL HISTORY: Summer Williams is a 72 year old with above-mentioned history metastatic lung cancer who is here for follow-up visit.  We have not treated her in a long time and she has not had any scans in a long time either.  Overall she appears to be doing quite well.  She denies any chest pain.  She has chronic shortness of breath related to worsening COPD. She has chronic fatigue.  REVIEW OF SYSTEMS:   Constitutional: Denies fevers, chills or abnormal weight loss Eyes: Denies blurriness of vision Ears, nose, mouth, throat, and  face: Denies mucositis or sore throat Respiratory: Chronic shortness of breath related to COPD Cardiovascular: Denies palpitation, chest discomfort Gastrointestinal:  Denies nausea, heartburn or change in bowel habits Skin: Denies abnormal skin rashes Lymphatics: Denies new lymphadenopathy or easy bruising Neurological:Denies numbness, tingling or new weaknesses Behavioral/Psych: Mood is stable, no new changes  Extremities: No lower extremity edema Breast:  denies any pain or lumps or nodules in either breasts All other systems were reviewed with the  patient and are negative.  I have reviewed the past medical history, past surgical history, social history and family history with the patient and they are unchanged from previous note.  ALLERGIES:  has No Known Allergies.  MEDICATIONS:  Current Outpatient Medications  Medication Sig Dispense Refill  . albuterol (PROVENTIL) (2.5 MG/3ML) 0.083% nebulizer solution Take 3 mLs (2.5 mg total) by nebulization every 6 (six) hours as needed for wheezing or shortness of breath. 75 mL 12  . anastrozole (ARIMIDEX) 1 MG tablet TAKE 1 TABLET (1 MG TOTAL) BY MOUTH DAILY. 90 tablet 3  . arformoterol (BROVANA) 15 MCG/2ML NEBU 1 neb every 8 hours if needed 240 mL 6  . aspirin 81 MG tablet Take 1 tablet (81 mg total) by mouth daily.    Marland Kitchen atorvastatin (LIPITOR) 40 MG tablet TAKE 1 TABLET BY MOUTH EVERY DAY 90 tablet 0  . Cholecalciferol (VITAMIN D) 2000 UNITS CAPS Take 1 capsule by mouth daily.     . cholestyramine (QUESTRAN) 4 g packet TAKE 1 PACKET (4 G TOTAL) BY MOUTH 2 (TWO) TIMES DAILY. 180 packet 1  . citalopram (CELEXA) 20 MG tablet     . fentaNYL (DURAGESIC - DOSED MCG/HR) 25 MCG/HR patch Place 1 patch (25 mcg total) onto the skin every 3 (three) days. 10 patch 0  . loratadine (CLARITIN) 10 MG tablet Take 10 mg by mouth daily.    . magnesium oxide (MAG-OX) 400 MG tablet Take 400 mg by mouth daily.    . Multiple Vitamins-Minerals (MULTIVITAMIN GUMMIES ADULT PO) Take 2 tablets by mouth daily.     Marland Kitchen OLANZapine (ZYPREXA) 7.5 MG tablet Take 7.5 mg by mouth daily.  3  . Omega-3 Fatty Acids (FISH OIL) 1200 MG CAPS Take 1 capsule by mouth daily.    Marland Kitchen oxyCODONE-acetaminophen (PERCOCET/ROXICET) 5-325 MG tablet Take 1 tablet by mouth every 6 (six) hours as needed for severe pain. 90 tablet 0  . pramipexole (MIRAPEX) 0.125 MG tablet Take 3 tablets (0.375 mg total) by mouth at bedtime. 90 tablet 3  . Probiotic Product (PROBIOTIC DAILY PO) Take 2 tablets by mouth daily. Probiotic gummy     No current  facility-administered medications for this visit.     PHYSICAL EXAMINATION: ECOG PERFORMANCE STATUS: 2 - Symptomatic, <50% confined to bed  Vitals:   06/08/17 1527  BP: (!) 141/81  Pulse: 94  Resp: 20  Temp: 98.4 F (36.9 C)  SpO2: 93%   Filed Weights   06/08/17 1527  Weight: 170 lb 11.2 oz (77.4 kg)    GENERAL:alert, no distress and comfortable SKIN: skin color, texture, turgor are normal, no rashes or significant lesions EYES: normal, Conjunctiva are pink and non-injected, sclera clear OROPHARYNX:no exudate, no erythema and lips, buccal mucosa, and tongue normal  NECK: supple, thyroid normal size, non-tender, without nodularity LYMPH:  no palpable lymphadenopathy in the cervical, axillary or inguinal LUNGS: Diminished breath sounds throughout her chest from COPD HEART: regular rate & rhythm and no murmurs and no lower extremity edema ABDOMEN:abdomen soft, non-tender and  normal bowel sounds MUSCULOSKELETAL:no cyanosis of digits and no clubbing  NEURO: alert & oriented x 3 with fluent speech, no focal motor/sensory deficits EXTREMITIES: No lower extremity edema   LABORATORY DATA:  I have reviewed the data as listed   Chemistry      Component Value Date/Time   NA 145 06/08/2017 1451   NA 138 12/02/2016 1437   K 4.3 06/08/2017 1451   K 3.8 12/02/2016 1437   CL 106 06/08/2017 1451   CO2 30 (H) 06/08/2017 1451   CO2 27 12/02/2016 1437   BUN 8 06/08/2017 1451   BUN 7.4 12/02/2016 1437   CREATININE 0.82 06/08/2017 1451   CREATININE 0.71 02/03/2017 1121   CREATININE 0.8 12/02/2016 1437      Component Value Date/Time   CALCIUM 9.6 06/08/2017 1451   CALCIUM 9.5 12/02/2016 1437   ALKPHOS 94 06/08/2017 1451   ALKPHOS 108 12/02/2016 1437   AST 23 06/08/2017 1451   AST 23 12/02/2016 1437   ALT 19 06/08/2017 1451   ALT 13 12/02/2016 1437   BILITOT 0.7 06/08/2017 1451   BILITOT 1.11 12/02/2016 1437       Lab Results  Component Value Date   WBC 5.8 06/08/2017    HGB 12.3 06/08/2017   HCT 37.2 06/08/2017   MCV 90.6 06/08/2017   PLT 69 (L) 06/08/2017   NEUTROABS 3.3 06/08/2017    ASSESSMENT & PLAN:  Cancer of lower-inner quadrant of left female breast 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 based on CT scan done on 10/11/2014, no mediastinal or hilar lymphadenopathy by CT findings, T2 N0 M0 stage IB Status post definitive radiation therapy completed 01/19/2015 Patient is not a candidate for chemotherapy because of her performance status  CT chest 03/12/2015 Right middle and upper lobe spiculated groundglass mass measures 2.7 x 2.23.3 cm previously was 3.4 x 2.44.2 cm, 3 mm right middle lobe nodule, chronic left lower lobe nodule 3 mm  PET/CT scan 02/15/2016: 2.0 x 1.5 cm spiculated RML nodule. Hypermetabolic subpleural nodularity along the lateral right chest wall, suspicious for pleural-based metastasis. 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 There is not enough biopsy material for molecular testing. Guardant 360 is negative forEGFR mutation blood test. 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 However it is unclear if these mutations are the driver mutations. We do not have any data for their use in lung cancer.  2. with evidence of metastatic disease into the ribs in Chest wall, I discussed the role of single agent carboplatin given every 3 weeks +/- Taxol After much discussion, patient decided to not received any further chemotherapy.   Right rib pain: Completed palliative radiation 11/13/2017with excellent improvement in pain Bone metastases: The patient does not want to get any treatment.  She does not wish to get Xgeva.  PET/CT scan  11/24/2016: Post radiation changes in both the lungs no new metastases, stable left parotid lesion, no residual hypermetabolic activity within the right anterior chest wall, healing fractures right fifth and sixth ribs  Mammogram Aug 2018: Neg  Patient does not want to undergo any CT scans or imaging studies because she does not want to go to any lung cancer treatments.  She will [place] has any symptoms.  Otherwise we will see her back in 6 months.   I spent 25 minutes  talking to the patient of which more than half was spent in counseling and coordination of care.  No orders of the defined types were placed in this encounter.  The patient has a good understanding of the overall plan. she agrees with it. she will call with any problems that may develop before the next visit here.   Harriette Ohara, MD 06/08/17

## 2017-06-25 ENCOUNTER — Telehealth: Payer: Self-pay | Admitting: Internal Medicine

## 2017-06-25 MED ORDER — ARFORMOTEROL TARTRATE 15 MCG/2ML IN NEBU: INHALATION_SOLUTION | RESPIRATORY_TRACT | 6 refills | Status: DC

## 2017-06-25 NOTE — Telephone Encounter (Signed)
ATC pt, no answer. Left message for pt to call back.  Rx sent to pharmacy.

## 2017-06-26 NOTE — Telephone Encounter (Signed)
Spoke with pt and advised rx sent to pharmacy. Nothing further is needed.   

## 2017-07-06 ENCOUNTER — Other Ambulatory Visit: Payer: Self-pay

## 2017-07-06 DIAGNOSIS — Z1329 Encounter for screening for other suspected endocrine disorder: Secondary | ICD-10-CM

## 2017-07-07 ENCOUNTER — Ambulatory Visit (INDEPENDENT_AMBULATORY_CARE_PROVIDER_SITE_OTHER): Payer: PPO | Admitting: Internal Medicine

## 2017-07-07 ENCOUNTER — Encounter: Payer: Self-pay | Admitting: Internal Medicine

## 2017-07-07 VITALS — BP 130/90 | HR 99 | Ht 65.0 in | Wt 168.0 lb

## 2017-07-07 DIAGNOSIS — Z1329 Encounter for screening for other suspected endocrine disorder: Secondary | ICD-10-CM

## 2017-07-07 DIAGNOSIS — C342 Malignant neoplasm of middle lobe, bronchus or lung: Secondary | ICD-10-CM

## 2017-07-07 DIAGNOSIS — Z9981 Dependence on supplemental oxygen: Secondary | ICD-10-CM

## 2017-07-07 DIAGNOSIS — E785 Hyperlipidemia, unspecified: Secondary | ICD-10-CM | POA: Diagnosis not present

## 2017-07-07 DIAGNOSIS — D0512 Intraductal carcinoma in situ of left breast: Secondary | ICD-10-CM | POA: Diagnosis not present

## 2017-07-07 DIAGNOSIS — J449 Chronic obstructive pulmonary disease, unspecified: Secondary | ICD-10-CM | POA: Diagnosis not present

## 2017-07-07 LAB — LIPID PANEL
CHOL/HDL RATIO: 2 (calc) (ref <5.0)
Cholesterol: 158 mg/dL (ref <200)
HDL: 81 mg/dL (ref >50)
LDL CHOLESTEROL (CALC): 56 mg/dL
NON-HDL CHOLESTEROL (CALC): 77 mg/dL (ref <130)
TRIGLYCERIDES: 127 mg/dL (ref <150)

## 2017-07-07 LAB — TSH: TSH: 2.55 mIU/L (ref 0.40–4.50)

## 2017-07-07 NOTE — Progress Notes (Signed)
   Subjective:    Patient ID: Summer Williams, female    DOB: 1945/10/16, 72 y.o.   MRN: [id number]  HPI 72 year old Female for 6 month recheck. Followed by Dr. Annamaria Boots for COPD Now on home O2. Has DNR in her house in the hall. Wants one for her purse. Will complete for her.  Hx primary lung and  primary breast cancer. Is to take Arimidex for 5 years started in 2016. Has abnormal PET scan with lung nodule.and pleural based met treated with radiation.  Hx hyperlipidemia treatedted with statin.  History of schizoaffective disorder.  She had left breast lumpectomy September 2015 for an invasive grade 2 ductal carcinoma 1.5 cm with low-grade DCIS( posterior aspect of the inferior margin close 0.15 cm) 3 nodes negative.Subsequently had radiation therapy Then in  June 2016 had right lung biopsy TTf-1 strong diffuse expression CK7 strong diffuse expression, GCDFP negative, ER weak.. Treated with radiation with decrease in tumor size.  PET scan June 2018 showed no hypermetabolic mediastinal hilar internal mammary or axillary lymph nodes.  There was a new patchy groundglass opacity anteriorly in the left upper lobe also associated with low level hypermetabolic activity.  Underlying sub-solid nodules in the right upper lobe and left lower lobe are stable.  Has atherosclerosis of aorta and great vessels and coronary arteries.  Liver showed no hypermetabolic activity nor did adrenal glands spleen or pancreas.  She declined systemic chemotherapy.  Still drives and ambulates slowly.  History of hyperlipidemia treated with statin.  History of elevated liver enzymes with negative workup except for fatty liver.  Remote history of alcohol abuse number of years ago.  History of schizoaffective disorder which is stable.  Review of Systems     Objective:   Physical Exam She ambulates slowly.  Neck is supple without JVD thyromegaly or carotid bruits.  Chest clear to auscultation.  Cardiac exam regular rate  and rhythm normal S1 and 2.  Extremities without pitting edema.       Assessment & Plan:  Hx Lung cancer-see above Hx Breast cancer-see above Elevated TSH- recheck was in 4 range at last visit COPD-on chronic oxygen therapy Hyperlipidemia-on statin therapy  Plan: Lipid panel is within normal limits.  TSH is normal at 2.55.  I am pleased with her results.  She is to return in 6 months for physical examination.

## 2017-07-08 NOTE — Patient Instructions (Signed)
We are pleased with your current health status and lack of progression of the cancer in your lung.  Continue same medications.  Your lipid panel is stable on statin medication.  Physical exam due in 6 months.

## 2017-07-09 DIAGNOSIS — J449 Chronic obstructive pulmonary disease, unspecified: Secondary | ICD-10-CM | POA: Diagnosis not present

## 2017-07-24 DIAGNOSIS — J449 Chronic obstructive pulmonary disease, unspecified: Secondary | ICD-10-CM | POA: Diagnosis not present

## 2017-08-06 DIAGNOSIS — J449 Chronic obstructive pulmonary disease, unspecified: Secondary | ICD-10-CM | POA: Diagnosis not present

## 2017-08-07 ENCOUNTER — Other Ambulatory Visit: Payer: Self-pay

## 2017-08-07 MED ORDER — ATORVASTATIN CALCIUM 40 MG PO TABS: 40.0000 mg | ORAL_TABLET | Freq: Every day | ORAL | 0 refills | Status: DC

## 2017-08-10 ENCOUNTER — Other Ambulatory Visit: Payer: Self-pay

## 2017-08-10 DIAGNOSIS — C7951 Secondary malignant neoplasm of bone: Secondary | ICD-10-CM

## 2017-08-10 MED ORDER — OXYCODONE-ACETAMINOPHEN 5-325 MG PO TABS: 1.0000 | ORAL_TABLET | Freq: Four times a day (QID) | ORAL | 0 refills | Status: DC | PRN

## 2017-08-10 NOTE — Telephone Encounter (Signed)
Called pt to inform her her prescription for oxycodone would be available for pick up tomorrow.  Cyndia Bent RN

## 2017-09-03 ENCOUNTER — Ambulatory Visit (INDEPENDENT_AMBULATORY_CARE_PROVIDER_SITE_OTHER)
Admission: RE | Admit: 2017-09-03 | Discharge: 2017-09-03 | Disposition: A | Discharge status: Home / Self Care | Payer: PPO | Source: Ambulatory Visit | Attending: Internal Medicine | Admitting: Internal Medicine

## 2017-09-03 ENCOUNTER — Other Ambulatory Visit: Payer: PPO

## 2017-09-03 ENCOUNTER — Encounter: Payer: Self-pay | Admitting: Internal Medicine

## 2017-09-03 ENCOUNTER — Ambulatory Visit: Payer: PPO | Admitting: Internal Medicine

## 2017-09-03 VITALS — BP 110/62 | HR 100 | Wt 167.8 lb

## 2017-09-03 DIAGNOSIS — J9611 Chronic respiratory failure with hypoxia: Secondary | ICD-10-CM | POA: Diagnosis not present

## 2017-09-03 DIAGNOSIS — R918 Other nonspecific abnormal finding of lung field: Secondary | ICD-10-CM | POA: Diagnosis not present

## 2017-09-03 DIAGNOSIS — R9389 Abnormal findings on diagnostic imaging of other specified body structures: Secondary | ICD-10-CM | POA: Diagnosis not present

## 2017-09-03 DIAGNOSIS — C342 Malignant neoplasm of middle lobe, bronchus or lung: Secondary | ICD-10-CM | POA: Diagnosis not present

## 2017-09-03 NOTE — Patient Instructions (Signed)
Order- CXR   Dx  Hx XRT for breast and lung cancer  Suggest you continue O2 2L while sleeping and inactive, increasing to 3L if needed during exertion.    Please call if we can help

## 2017-09-03 NOTE — Progress Notes (Signed)
HPI female former smoker followed for COPD/chronic bronchitis severe, [place] Ca/ lung nodules/ XRT, complicated by history left breast cancer/XRT, history schizoaffective disorder a1AT normal 158 no phenotype 6 minute walk test 08/30/2012-92%, 87%, 94%, 297 m. Significant desaturation with exercise. PFT: 08/30/2012- severe obstructive airways disease with response to bronchodilator, air trapping, diffusion severely reduced. FVC 1.96/71%, FEV1 0.89/45%, FEV1/FVC 0.45. RV 134%, DLCO 47%. PFT: 11/02/2014-severe obstructive airways disease. FEV1 0.90/39% with significant response to bronchodilator. Walk Test room air 05/05/16- desaturation to 87%- declines portable oxygen Walk test on room air 07/04/2015-very transient dip to 88% but otherwise not in range to qualify for exercise O2 CT chest 02/06/2016--Progressive nodular soft tissue in the right middle and lower lobe, with increased solid component when compared to the prior study, suggesting recurrent tumor PET scan 02/15/16-  2.0 x 1.5 cm spiculated right middle lobe nodule, corresponding to known primary bronchogenic neoplasm. Hypermetabolic subpleural nodularity along the lateral right chest wall, suspicious for pleural-based metastasis. Associated hypermetabolism of the overlying chest wall musculature is worrisome for metastatic involvement. Suspected osseous metastasis involving the right anterolateral 7th rib. 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 Walk Test room air 05/05/17-resting saturation 93%/HR 106, lap 2-saturation 88%/HR 139.  Stopped and placed on 2 L O2 saturation 95%. --------------------------------------------------------------------------------------------------------- 05/05/17- 72 year old female former smoker followed for COPD/chronic bronchitis severe, [place] Ca/ lung nodules, complicated by history left breast cancer/XRT, history  schizoaffective disorder ---O2 2 L sleep/APS            Son here COPD mixed type; Pt states she notices she is sleeping more during the day and has increased SOB during the day. Pt has O2 at night and has had to use O2 during the day as well at times.  Walk Test room air 05/05/17-resting saturation 93%/HR 106, lap 2-saturation 88%/HR 139.  Stopped and placed on 2 L O2 saturation 95%. Breathing is comfortable at rest with wheeze only noted with exertion.  Increased to DOE. She continues active follow-up with oncology.  Mild pressure/ache at times right lateral chest wall without other chest pain. She complains that nebulizer with albuterol only last 15-30 minutes after treatment.  No palpitation. PET scan 11/24/16- IMPRESSION: 1. Suspected evolving radiation changes in both lungs. Associated hypermetabolic activity is ill-defined and low level. Chest CT follow-up in 6 months suggested. 2. No focal hypermetabolic activity to suggest local recurrence or new metastases. 3. 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  Little cough or wheeze.  She denies chest pain, adenopathy or fever. Continues to follow with Dr. Gudena/oncology   ROS-see HPI + = positive Constitutional:   No-   weight loss, night sweats, fevers, chills, fatigue, lassitude. HEENT:   No-  headaches, difficulty swallowing, tooth/dental problems, sore throat,       No-  sneezing, itching, ear ache, nasal congestion, post nasal drip,  CV:    chest pain, orthopnea, PND, swelling in lower extremities, anasarca, dizziness, palpitations Resp: +  shortness of breath with exertion or at rest.                 productive cough,  + non-productive cough,  No- coughing up of blood.                 change in color of mucus.  No- wheezing.   Skin: No-   rash or lesions. GI:  No-   heartburn, indigestion, abdominal pain, nausea, vomiting,  GU:  MS:  No-   joint pain or swelling.   Neuro-     nothing unusual Psych:  No- change in mood or affect. No depression or anxiety.  No memory loss.  OBJ- Physical Exam General- Alert, Oriented, Affect-appropriate, Distress- none acute, overweight, + O2 Skin- rash-none, lesions- none, excoriation- none Lymphadenopathy- none Head- atraumatic            Eyes- Gross vision intact, PERRLA, conjunctivae and secretions clear            Ears- Hearing, canals-normal            Nose- Clear, no-Septal dev, mucus, polyps, erosion, perforation             Throat- Mallampati II , mucosa clear , drainage- none, tonsils- atrophic,  Neck- flexible , trachea midline, no stridor , thyroid nl, carotid no bruit Chest - symmetrical excursion , unlabored           Heart/CV- RRR , no murmur , no gallop  , no rub, nl s1 s2                           - JVD- none , edema- none, stasis changes- none, varices- none           Lung- +diminished, clear, unlabored, wheeze- none, No- cough , dullness-none, rub- none           Chest wall-not tender and no rub audible over right mid/lateral chest wall Abd-  Br/ Gen/ Rectal- Not done, not indicated Extrem- cyanosis- none, clubbing, none, atrophy- none, strength- nl Neuro- grossly intact to observation

## 2017-09-04 ENCOUNTER — Telehealth: Payer: Self-pay | Admitting: Internal Medicine

## 2017-09-04 NOTE — Telephone Encounter (Signed)
OK 

## 2017-09-04 NOTE — Telephone Encounter (Signed)
Called and spoke to patient and let her know what recommendations are based on chest xray. Patient stated she does not currently want a CT scan or any lab work. Patient stated that she will defer everything to Dr. Lindi Adie at this time.   Will route to CY as [employer]

## 2017-09-04 NOTE — Telephone Encounter (Signed)
The CXR from 4/4 shows more density in the right lung. Radiologist can't tell if it is just some progression of fibrosis from her radiation therapy, and recommends we update a CT scan.  Please order CT chest with contrast, BMET     Dx lung mass versus scarring, hx breast and lung cancer with XRT

## 2017-09-04 NOTE — Telephone Encounter (Signed)
rec'd a call report call from [place] from Radiology reporting that pt had a CXR showing Mass in Right Mid lung; had some radiation changes from last CT on 11/24/2016.  Routing message for review to [facility]

## 2017-09-06 DIAGNOSIS — J449 Chronic obstructive pulmonary disease, unspecified: Secondary | ICD-10-CM | POA: Diagnosis not present

## 2017-09-06 NOTE — Assessment & Plan Note (Signed)
She remains dependent on oxygen and is using it appropriately. Plan-recommend oxygen 2 L for sleep and rest, 3 L if needed for exertion.

## 2017-09-06 NOTE — Assessment & Plan Note (Signed)
No recent imaging Plan-CXR

## 2017-09-21 DIAGNOSIS — J449 Chronic obstructive pulmonary disease, unspecified: Secondary | ICD-10-CM | POA: Diagnosis not present

## 2017-10-06 DIAGNOSIS — J449 Chronic obstructive pulmonary disease, unspecified: Secondary | ICD-10-CM | POA: Diagnosis not present

## 2017-10-21 DIAGNOSIS — J449 Chronic obstructive pulmonary disease, unspecified: Secondary | ICD-10-CM | POA: Diagnosis not present

## 2017-10-31 ENCOUNTER — Other Ambulatory Visit: Payer: Self-pay | Admitting: Internal Medicine

## 2017-11-03 ENCOUNTER — Other Ambulatory Visit: Payer: Self-pay

## 2017-11-03 DIAGNOSIS — C7951 Secondary malignant neoplasm of bone: Secondary | ICD-10-CM

## 2017-11-03 MED ORDER — OXYCODONE-ACETAMINOPHEN 5-325 MG PO TABS: 1.0000 | ORAL_TABLET | Freq: Four times a day (QID) | ORAL | 0 refills | Status: DC | PRN

## 2017-11-03 NOTE — Telephone Encounter (Signed)
Called pt to inform her script refill is available for pick up this afternoon.  Cyndia Bent RN

## 2017-11-06 DIAGNOSIS — J449 Chronic obstructive pulmonary disease, unspecified: Secondary | ICD-10-CM | POA: Diagnosis not present

## 2017-11-21 DIAGNOSIS — J449 Chronic obstructive pulmonary disease, unspecified: Secondary | ICD-10-CM | POA: Diagnosis not present

## 2017-12-06 DIAGNOSIS — J449 Chronic obstructive pulmonary disease, unspecified: Secondary | ICD-10-CM | POA: Diagnosis not present

## 2017-12-08 ENCOUNTER — Telehealth: Payer: Self-pay | Admitting: Hematology and Oncology

## 2017-12-08 ENCOUNTER — Inpatient Hospital Stay: Payer: PPO | Attending: Hematology and Oncology | Admitting: Hematology and Oncology

## 2017-12-08 DIAGNOSIS — C50312 Malignant neoplasm of lower-inner quadrant of left female breast: Secondary | ICD-10-CM | POA: Diagnosis not present

## 2017-12-08 DIAGNOSIS — Z923 Personal history of irradiation: Secondary | ICD-10-CM

## 2017-12-08 DIAGNOSIS — C7951 Secondary malignant neoplasm of bone: Secondary | ICD-10-CM | POA: Insufficient documentation

## 2017-12-08 DIAGNOSIS — C342 Malignant neoplasm of middle lobe, bronchus or lung: Secondary | ICD-10-CM | POA: Insufficient documentation

## 2017-12-08 DIAGNOSIS — Z79811 Long term (current) use of aromatase inhibitors: Secondary | ICD-10-CM | POA: Insufficient documentation

## 2017-12-08 DIAGNOSIS — Z170 Estrogen receptor positive status [ER+]: Secondary | ICD-10-CM | POA: Insufficient documentation

## 2017-12-08 NOTE — Progress Notes (Signed)
Patient Care Team: Elby Showers, MD as PCP - General (Internal Medicine) Nicholas Lose, MD as Consulting Physician (Hematology and Oncology) Erroll Luna, MD as Consulting Physician (General Surgery) Thea Silversmith, MD as Consulting Physician (Radiation Oncology)  DIAGNOSIS:  Encounter Diagnosis  Name Primary?  . 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      02/09/2014 Surgery    Left breast lumpectomy: Invasive grade 2 ductal carcinoma 1.5 cm with low-grade grade DCIS posterior aspect of the inferior margin close (0.15 cm), 3 SLN negative, Oncotype DX recurrence score 9 low risk (7% ROR)      03/13/2014 - 05/02/2014 Radiation Therapy    Adjuvant radiation therapy      06/02/2014 -  Anti-estrogen oral therapy    Arimidex 1 mg daily, plan is for 5 years of treatment      12/19/2014 Initial Biopsy    Left upper arm soft tissue biopsy: Fat necrosis and fibrosis no evidence of malignancy      11/24/2016 PET scan    Radiation changes in both lungs, no evidence of recurrence, stable left paratracheal lesion       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 ([place])   11/10/2014 Initial Diagnosis    Right lung biopsy adenocarcinoma TTF-1 strong diffuse expression, CK 7 strong diffuse expression GCDFP negative; estrogen receptor weak      12/13/2014 - 01/19/2015 Radiation Therapy    Right upper/middle lobe of lung, 2.6 Gy/frac for total of 70.2 Gy (definitive radiation)      03/12/2015 Imaging    Right middle and upper lobe spiculated groundglass mass measures 2.7 x 2.23.3 cm previously was 3.4 x  2.44.2 cm, 3 mm right middle lobe nodule, chronic left lower lobe nodule 3 mm      07/31/2015 Imaging    CT chest :similar to slight decrease in the sub-solitary pulmonary nodule (2.3 x 1.7 cm was previously 2.7 x 2.2 cm) in the right upper and middle lobes, treatment-related changes, unchanged pulmonary nodules in right and left lungs      02/15/2016 PET scan    2.0 x 1.5 cm spiculated RML nodule. Hypermetabolic subpleural nodularity along the lateral right chest wall, suspicious for pleural-based metastasis. Associated hypermetabolism of the overlying chest wall musculature Suspected bone met 7th rib      03/31/2016 - 04/14/2016 Radiation Therapy    Palliative radiation to rib       CHIEF COMPLIANT: Follow-up of breast cancer and lung cancer  INTERVAL HISTORY: Summer Williams is a 72 year old with above-mentioned history of lung cancer who is currently not receiving any treatment.  She has known metastatic disease but has not progressed clinically.  She did not want to get any systemic therapy and she did not want to get any routine scans.  She is here for routine checkup.  Apart from the chronic shortness of breath which now requires 24-hour oxygen she is otherwise doing okay.  Denies any pain anywhere.  She had a chest x-ray in April which showed radiation related scar tissue.  REVIEW OF SYSTEMS:  Constitutional: Denies fevers, chills or abnormal weight loss Eyes: Denies blurriness of vision Ears, nose, mouth, throat, and face: Denies mucositis or sore throat Respiratory: Denies cough, dyspnea or wheezes Cardiovascular: Denies palpitation, chest discomfort Gastrointestinal:  Denies nausea, heartburn or change in bowel habits Skin: Denies abnormal skin rashes Lymphatics: Denies new lymphadenopathy or easy bruising Neurological:Denies numbness, tingling or new weaknesses Behavioral/Psych: Mood is stable, no new changes  Extremities: No lower extremity edema   All other systems  were reviewed with the patient and are negative.  I have reviewed the past medical history, past surgical history, social history and family history with the patient and they are unchanged from previous note.  ALLERGIES:  has No Known Allergies.  MEDICATIONS:  Current Outpatient Medications  Medication Sig Dispense Refill  . albuterol (PROVENTIL) (2.5 MG/3ML) 0.083% nebulizer solution Take 3 mLs (2.5 mg total) by nebulization every 6 (six) hours as needed for wheezing or shortness of breath. 75 mL 12  . anastrozole (ARIMIDEX) 1 MG tablet TAKE 1 TABLET (1 MG TOTAL) BY MOUTH DAILY. 90 tablet 3  . arformoterol (BROVANA) 15 MCG/2ML NEBU 1 neb every 8 hours if needed 240 mL 6  . aspirin 81 MG tablet Take 1 tablet (81 mg total) by mouth daily.    Marland Kitchen atorvastatin (LIPITOR) 40 MG tablet TAKE 1 TABLET BY MOUTH EVERY DAY 90 tablet 0  . Cholecalciferol (VITAMIN D) 2000 UNITS CAPS Take 1 capsule by mouth daily.     . cholestyramine (QUESTRAN) 4 g packet TAKE 1 PACKET (4 G TOTAL) BY MOUTH 2 (TWO) TIMES DAILY. 180 packet 1  . citalopram (CELEXA) 20 MG tablet     . fentaNYL (DURAGESIC - DOSED MCG/HR) 25 MCG/HR patch Place 1 patch (25 mcg total) onto the skin every 3 (three) days. 10 patch 0  . loratadine (CLARITIN) 10 MG tablet Take 10 mg by mouth daily.    . magnesium oxide (MAG-OX) 400 MG tablet Take 400 mg by mouth daily.    . Multiple Vitamins-Minerals (MULTIVITAMIN GUMMIES ADULT PO) Take 2 tablets by mouth daily.     Marland Kitchen OLANZapine (ZYPREXA) 7.5 MG tablet Take 7.5 mg by mouth daily.  3  . Omega-3 Fatty Acids (FISH OIL) 1200 MG CAPS Take 1 capsule by mouth daily.    Marland Kitchen oxyCODONE-acetaminophen (PERCOCET/ROXICET) 5-325 MG tablet Take 1 tablet by mouth every 6 (six) hours as needed for severe pain. 90 tablet 0  . Probiotic Product (PROBIOTIC DAILY PO) Take 2 tablets by mouth daily. Probiotic gummy     No current facility-administered medications for this visit.     PHYSICAL EXAMINATION: ECOG PERFORMANCE  STATUS: 2 - Symptomatic, <50% confined to bed  Vitals:   12/08/17 1517  BP: (!) 148/75  Pulse: 87  Resp: 17  Temp: 97.9 F (36.6 C)  SpO2: 95%   Filed Weights   12/08/17 1517  Weight: 164 lb 14.4 oz (74.8 kg)    GENERAL:alert, no distress and comfortable SKIN: skin color, texture, turgor are normal, no rashes or significant lesions EYES: normal, Conjunctiva are pink and non-injected, sclera clear OROPHARYNX:no exudate, no erythema and lips, buccal mucosa, and tongue normal  NECK: supple, thyroid normal size, non-tender, without nodularity LYMPH:  no palpable lymphadenopathy in the cervical, axillary or inguinal LUNGS: clear to auscultation and percussion with normal breathing effort HEART: regular rate & rhythm and no murmurs and no lower extremity edema ABDOMEN:abdomen soft, non-tender and normal bowel sounds MUSCULOSKELETAL:no cyanosis of digits and no clubbing  NEURO: alert &  oriented x 3 with fluent speech, no focal motor/sensory deficits EXTREMITIES: No lower extremity edema   LABORATORY DATA:  I have reviewed the data as listed CMP Latest Ref Rng & Units 06/08/2017 02/03/2017 12/02/2016  Glucose 70 - 140 mg/dL 100 94 115  BUN 7 - 26 mg/dL 8 8 7.4  Creatinine 0.60 - 1.10 mg/dL 0.82 0.71 0.8  Sodium 136 - 145 mmol/L 145 144 138  Potassium 3.3 - 4.7 mmol/L 4.3 4.0 3.8  Chloride 98 - 109 mmol/L 106 106 -  CO2 22 - 29 mmol/L 30(H) 25 27  Calcium 8.4 - 10.4 mg/dL 9.6 9.3 9.5  Total Protein 6.4 - 8.3 g/dL 7.5 6.8 7.6  Total Bilirubin 0.2 - 1.2 mg/dL 0.7 0.7 1.11  Alkaline Phos 40 - 150 U/L 94 95 108  AST 5 - 34 U/L 23 22 23   ALT 0 - 55 U/L 19 16 13     Lab Results  Component Value Date   WBC 5.8 06/08/2017   HGB 12.3 06/08/2017   HCT 37.2 06/08/2017   MCV 90.6 06/08/2017   PLT 69 (L) 06/08/2017   NEUTROABS 3.3 06/08/2017    ASSESSMENT & PLAN:  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 Hypermetabolic subpleural nodularity along the lateral right chest wall, suspicious for pleural-based metastasis. 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 There is not enough biopsy material for molecular testing. Guardant 360 is negative forEGFR mutation blood test. She was noted to have NRAS (G13R), TP53 (l162F) and NF1 ([id number]) Patient refused chemo  There is no need to do scans because she does not want to receive any systemic therapy.  It is incredible that she is still alive even without any systemic treatment for lung cancer.  Patient will continue to [place] 6 months to be checked up.   No orders of the defined types were placed in this encounter.  The patient has a good understanding of the overall plan. she agrees with it. she will call with any problems that may develop before the next visit here.   VHarriette Ohara MD 12/08/17

## 2017-12-08 NOTE — Assessment & Plan Note (Signed)
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 Hypermetabolic subpleural nodularity along the lateral right chest wall, suspicious for pleural-based metastasis. 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 There is not enough biopsy material for molecular testing. Guardant 360 is negative forEGFR mutation blood test. She was noted to have NRAS (G13R), TP53 (l162F) and NF1 ([id number]) Patient refused chemo

## 2017-12-08 NOTE — Telephone Encounter (Signed)
Gave avs and calendar ° °

## 2017-12-10 ENCOUNTER — Other Ambulatory Visit: Payer: Self-pay | Admitting: Radiation Oncology

## 2017-12-10 DIAGNOSIS — Z853 Personal history of malignant neoplasm of breast: Secondary | ICD-10-CM

## 2017-12-21 DIAGNOSIS — J449 Chronic obstructive pulmonary disease, unspecified: Secondary | ICD-10-CM | POA: Diagnosis not present

## 2018-01-03 ENCOUNTER — Other Ambulatory Visit: Payer: Self-pay | Admitting: Gastroenterology

## 2018-01-04 NOTE — Telephone Encounter (Signed)
OK to refill or does she need to make an appt first? Last seen 11-2015.  Colon in 2016.  No follow up appt scheduled.

## 2018-01-05 ENCOUNTER — Ambulatory Visit
Admission: RE | Admit: 2018-01-05 | Discharge: 2018-01-05 | Disposition: A | Discharge status: Home / Self Care | Payer: PPO | Source: Ambulatory Visit | Attending: Radiation Oncology | Admitting: Radiation Oncology

## 2018-01-05 DIAGNOSIS — Z853 Personal history of malignant neoplasm of breast: Secondary | ICD-10-CM

## 2018-01-05 DIAGNOSIS — R928 Other abnormal and inconclusive findings on diagnostic imaging of breast: Secondary | ICD-10-CM | POA: Diagnosis not present

## 2018-01-05 HISTORY — DX: Personal history of irradiation: Z92.3

## 2018-01-05 NOTE — Telephone Encounter (Signed)
We can refill and schedule a routine follow up. Thanks

## 2018-01-06 DIAGNOSIS — J449 Chronic obstructive pulmonary disease, unspecified: Secondary | ICD-10-CM | POA: Diagnosis not present

## 2018-01-21 DIAGNOSIS — J449 Chronic obstructive pulmonary disease, unspecified: Secondary | ICD-10-CM | POA: Diagnosis not present

## 2018-02-03 ENCOUNTER — Other Ambulatory Visit: Payer: Self-pay

## 2018-02-03 DIAGNOSIS — C7951 Secondary malignant neoplasm of bone: Secondary | ICD-10-CM

## 2018-02-03 MED ORDER — OXYCODONE-ACETAMINOPHEN 5-325 MG PO TABS: 1.0000 | ORAL_TABLET | Freq: Four times a day (QID) | ORAL | 0 refills | Status: DC | PRN

## 2018-02-03 NOTE — Telephone Encounter (Signed)
Pt called and lvm to request percocet refill to be picked up today. Called pt back to confirm that she will be able to pick up prescription this afternoon. No further concerns at this time.

## 2018-02-04 ENCOUNTER — Telehealth: Payer: Self-pay

## 2018-02-04 NOTE — Telephone Encounter (Signed)
Patient aware that Rx, Oxycodone, is available for pick up.  Script placed in medication book for pick up.

## 2018-02-05 ENCOUNTER — Encounter: Payer: PPO | Admitting: Internal Medicine

## 2018-02-06 DIAGNOSIS — J449 Chronic obstructive pulmonary disease, unspecified: Secondary | ICD-10-CM | POA: Diagnosis not present

## 2018-02-19 ENCOUNTER — Other Ambulatory Visit: Payer: Self-pay | Admitting: Internal Medicine

## 2018-02-21 DIAGNOSIS — J449 Chronic obstructive pulmonary disease, unspecified: Secondary | ICD-10-CM | POA: Diagnosis not present

## 2018-03-08 ENCOUNTER — Encounter: Payer: Self-pay | Admitting: Internal Medicine

## 2018-03-08 ENCOUNTER — Ambulatory Visit (INDEPENDENT_AMBULATORY_CARE_PROVIDER_SITE_OTHER): Payer: PPO | Admitting: Internal Medicine

## 2018-03-08 VITALS — BP 122/78 | HR 81 | Ht 63.0 in | Wt 156.8 lb

## 2018-03-08 DIAGNOSIS — J449 Chronic obstructive pulmonary disease, unspecified: Secondary | ICD-10-CM | POA: Diagnosis not present

## 2018-03-08 DIAGNOSIS — J9611 Chronic respiratory failure with hypoxia: Secondary | ICD-10-CM

## 2018-03-08 DIAGNOSIS — Z23 Encounter for immunization: Secondary | ICD-10-CM | POA: Diagnosis not present

## 2018-03-08 MED ORDER — UMECLIDINIUM-VILANTEROL 62.5-25 MCG/INH IN AEPB: 1.0000 | INHALATION_SPRAY | Freq: Every day | RESPIRATORY_TRACT | 0 refills | Status: DC

## 2018-03-08 NOTE — Progress Notes (Signed)
Patient seen in the office today and instructed on use of Anoro.  Patient expressed understanding and demonstrated technique. Parke Poisson, CMA 03/08/18

## 2018-03-08 NOTE — Assessment & Plan Note (Signed)
There may not be much bronchospasm, but since she is more aware of dyspnea on exertion we will let her try a sample of Anoro.  Flu vaccine given.

## 2018-03-08 NOTE — Patient Instructions (Signed)
Order- flu vax senior  Sample Anoro inhaler     Inhale 1 puff, once daily      See if this seems to help yor breathing after a few days  Ok increase your oxygen at 2- 3L  You can talk with Dr Lindi Adie about getting any more xray imaging or CT scans.

## 2018-03-08 NOTE — Addendum Note (Signed)
Addended by: Parke Poisson E on: 03/08/2018 05:26 PM   Modules accepted: Orders

## 2018-03-08 NOTE — Assessment & Plan Note (Signed)
Okay to increase oxygen to 3 L when needed, especially with exertion.

## 2018-03-08 NOTE — Progress Notes (Signed)
HPI female former smoker followed for COPD/chronic bronchitis severe, [place] Ca/ lung nodules/ XRT, complicated by history left breast cancer/XRT, history schizoaffective disorder a1AT normal 158 no phenotype 6 minute walk test 08/30/2012-92%, 87%, 94%, 297 m. Significant desaturation with exercise. PFT: 08/30/2012- severe obstructive airways disease with response to bronchodilator, air trapping, diffusion severely reduced. FVC 1.96/71%, FEV1 0.89/45%, FEV1/FVC 0.45. RV 134%, DLCO 47%. PFT: 11/02/2014-severe obstructive airways disease. FEV1 0.90/39% with significant response to bronchodilator. Walk Test room air 05/05/16- desaturation to 87%- declines portable oxygen Walk test on room air 07/04/2015-very transient dip to 88% but otherwise not in range to qualify for exercise O2 CT chest 02/06/2016--Progressive nodular soft tissue in the right middle and lower lobe, with increased solid component when compared to the prior study, suggesting recurrent tumor PET scan 02/15/16-  2.0 x 1.5 cm spiculated right middle lobe nodule, corresponding to known primary bronchogenic neoplasm. Hypermetabolic subpleural nodularity along the lateral right chest wall, suspicious for pleural-based metastasis. Associated hypermetabolism of the overlying chest wall musculature is worrisome for metastatic involvement. Suspected osseous metastasis involving the right anterolateral 7th rib. 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 Walk Test room air 05/05/17-resting saturation 93%/HR 106, lap 2-saturation 88%/HR 139.  Stopped and placed on 2 L O2 saturation 95%. PET scan 11/24/16-. Suspected evolving radiation changes in both lungs ---------------------------------------------------------------------------------------------------------  09/03/17- 72 year old female former smoker followed for COPD/chronic bronchitis severe, [place] Ca/  lung nodules/ XRT, complicated by history left breast cancer/XRT, history schizoaffective disorder ---O2 2 L sleep/[employer]             ----reports no new concerns Remains dependent on oxygen.  Little cough or wheeze.  She denies chest pain, adenopathy or fever. Continues to follow with Dr. Estill Bakes  03/08/2018- 72 year old female former smoker followed for COPD/chronic bronchitis severe, [place] Ca/ lung nodules/ XRT, complicated by history left breast cancer/XRT, history schizoaffective disorder O2 2- 3 L sleep/[employer] ----- reports dyspnea with activity is progressively worse since last ov.  denies any increased wheezing, cough, tightness, congestion Neb Brovana, neb albuterol-she says she is not using her nebulizer because it does not seem to help any.  Describes gradually increased awareness of dyspnea on exertion using O2 2 L. I discussed increasing to 3 L with her portable tank and pulse regulator as needed. I discussed her CXR report from April.  Apparently Dr. Lindi Adie did not think more imaging would be helpful for her based on his last note.  I asked her to discuss any imaging questions with him. CXR 09/04/2017- IMPRESSION: Masslike appearance in the right mid lung may be related to suspected radiation changes on PET-CT 11/24/2016, but has increased in extent when compared to scanogram. Recurrent mass is possible, recommend chest CT.    ROS-see HPI + = positive Constitutional:   No-   weight loss, night sweats, fevers, chills, fatigue, lassitude. HEENT:   No-  headaches, difficulty swallowing, tooth/dental problems, sore throat,       No-  sneezing, itching, ear ache, nasal congestion, post nasal drip,  CV:    chest pain, orthopnea, PND, swelling in lower extremities, anasarca, dizziness, palpitations Resp: +  shortness of breath with exertion or at rest.                productive cough,  + non-productive cough,  No- coughing up of blood.                 change  in color of mucus.   No- wheezing.   Skin: No-   rash or lesions. GI:  No-   heartburn, indigestion, abdominal pain, nausea, vomiting,  GU:  MS:  No-   joint pain or swelling.   Neuro-     nothing unusual Psych:  No- change in mood or affect. No depression or anxiety.  No memory loss.  OBJ- Physical Exam General- Alert, Oriented, Affect-appropriate, Distress- none acute, overweight, + O2 Skin- + minor excoriation left forehead Lymphadenopathy- none Head- atraumatic            Eyes- Gross vision intact, PERRLA, conjunctivae and secretions clear            Ears- Hearing, canals-normal            Nose- Clear, no-Septal dev, mucus, polyps, erosion, perforation             Throat- Mallampati II , mucosa clear , drainage- none, tonsils- atrophic,  Neck- flexible , trachea midline, no stridor , thyroid nl, carotid no bruit Chest - symmetrical excursion , unlabored           Heart/CV- RRR , no murmur , no gallop  , no rub, nl s1 s2                           - JVD- none , edema- none, stasis changes- none, varices- none           Lung- +diminished, clear, unlabored, wheeze- none, No- cough , dullness-none, rub- none           Chest wall-not tender and no rub audible over right mid/lateral chest wall Abd-  Br/ Gen/ Rectal- Not done, not indicated Extrem- cyanosis- none, clubbing, none, atrophy- none, strength- nl Neuro- grossly intact to observation

## 2018-03-23 DIAGNOSIS — J449 Chronic obstructive pulmonary disease, unspecified: Secondary | ICD-10-CM | POA: Diagnosis not present

## 2018-04-08 DIAGNOSIS — J449 Chronic obstructive pulmonary disease, unspecified: Secondary | ICD-10-CM | POA: Diagnosis not present

## 2018-04-23 DIAGNOSIS — J449 Chronic obstructive pulmonary disease, unspecified: Secondary | ICD-10-CM | POA: Diagnosis not present

## 2018-04-27 ENCOUNTER — Ambulatory Visit (INDEPENDENT_AMBULATORY_CARE_PROVIDER_SITE_OTHER): Payer: PPO | Admitting: Internal Medicine

## 2018-04-27 ENCOUNTER — Encounter: Payer: Self-pay | Admitting: Internal Medicine

## 2018-04-27 VITALS — BP 110/80 | HR 72 | Ht 63.0 in | Wt 155.0 lb

## 2018-04-27 DIAGNOSIS — J449 Chronic obstructive pulmonary disease, unspecified: Secondary | ICD-10-CM | POA: Diagnosis not present

## 2018-04-27 DIAGNOSIS — E785 Hyperlipidemia, unspecified: Secondary | ICD-10-CM

## 2018-04-27 DIAGNOSIS — C342 Malignant neoplasm of middle lobe, bronchus or lung: Secondary | ICD-10-CM

## 2018-04-27 DIAGNOSIS — Z0000 Encounter for general adult medical examination without abnormal findings: Secondary | ICD-10-CM | POA: Diagnosis not present

## 2018-04-27 DIAGNOSIS — J9611 Chronic respiratory failure with hypoxia: Secondary | ICD-10-CM | POA: Diagnosis not present

## 2018-04-27 DIAGNOSIS — C7951 Secondary malignant neoplasm of bone: Secondary | ICD-10-CM | POA: Diagnosis not present

## 2018-04-27 DIAGNOSIS — F259 Schizoaffective disorder, unspecified: Secondary | ICD-10-CM

## 2018-04-27 DIAGNOSIS — D0512 Intraductal carcinoma in situ of left breast: Secondary | ICD-10-CM | POA: Diagnosis not present

## 2018-04-27 DIAGNOSIS — D893 Immune reconstitution syndrome: Secondary | ICD-10-CM | POA: Insufficient documentation

## 2018-04-27 LAB — POCT URINALYSIS DIPSTICK
APPEARANCE: NORMAL
Bilirubin, UA: NEGATIVE
Blood, UA: NEGATIVE
Glucose, UA: NEGATIVE
Ketones, UA: NEGATIVE
LEUKOCYTES UA: NEGATIVE
Nitrite, UA: NEGATIVE
ODOR: NORMAL
PH UA: 6 (ref 5.0–8.0)
PROTEIN UA: POSITIVE — AB
Spec Grav, UA: 1.01 (ref 1.010–1.025)
Urobilinogen, UA: 0.2 E.U./dL

## 2018-04-27 NOTE — Progress Notes (Signed)
Subjective:    Patient ID: Summer Williams, female    DOB: [birth year], 72 y.o.   MRN: [id number]  HPI pleasant 72 year old female in today for Medicare wellness, exam and evaluation of medical issues.  No falls in the past year.  History of COPD which is stable and under good control.  History of hyperlipidemia, history of lung cancer, history of schizoaffective disorder which is stable.  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  Inferior margin was close.  3 lymph nodes were negative.  Recurrence was thought to be low risk.  Tumor was ER positive PR positive HER-2 negative.  She received adjuvant radiation therapy after surgery and was placed on a REM index for 5 years.  In 2016 was diagnosed with right lung adenocarcinoma.  She had nodules in the right middle and lower lobe.  Tumor was staged as T2 N0 M0 and she underwent radiation therapy.  She had PET scan in September 2017 showing hypermetabolic subpleural nodularity along the lateral right chest wall suspicious for pleural-based metastasis.  There was also a 2 x 1.5 cm spiculated right middle lobe nodule.  There was a suspected bone metastasis seventh rib.  She had palliative radiation to rib in November 2017.  Continues to be followed closely and has declined systemic chemotherapy.  PET scan during the summer 2018 did not show any evidence of distant metastatic disease.  She has some shortness of breath with minimal exertion.  She is followed by Dr. Lindi Adie, oncologist.  Dr. Annamaria Boots follows her for chronic respiratory failure.  Requires 24-hour oxygen therapy.  She had chest x-ray April 2019 which showed radiation related scar tissue.  Did not want to have routine scans at that point.  She is followed at the cancer center every 6 months.  In 2017 walk test room air she desaturated to 87%.  2 L of O2 improved her O2 sat to 95% in 2018.  Last PET scan was June 2018 with  suspected evolving radiation changes in both lungs.  Has little cough or wheezing.  Dr. Annamaria Boots told her she could increase her oxygen to 3 L with exertion if necessary.  She gets short of breath with exertion sometimes on 2 L.  Masslike appearance in the right midlung may be related to radiation changes but has increased in size some recurrent masses possible.  History of chronic diarrhea responded to Questran.  She had colonoscopy by Dr. Deatra Ina June 2016 showing diverticulosis and internal hemorrhoids but no polyps or tumors.  Screening for celiac disease was negative.  History of hyperlipidemia treated with statin.  History of elevated liver enzymes with work-up showing fatty liver.  Ultrasound of the upper abdomen November 2012 showed mild diffuse fatty liver.  History of vitamin D deficiency.  Had cholecystectomy 2003 in [place].  Cataract extraction of the right eye by Dr. Kathrin Penner in 2005.  Has prescription for inhalers.  History of alcohol abuse in the remote past.  No known drug allergies.  Social history and family history: She moved here from [place] to be near her family.  She previously worked as a Education officer, museum.  She smoked up to a pack of cigarettes daily for many years but quit smoking in early 2007 after smoking for some 50 years.  She last drank alcohol in 1980.  She is divorced.  Previously resided with her mother until her mother died of complications of  dementia in 2014.  Mother had a fall, was hospitalized and had failure to thrive and was placed in hospice therapy.  Mother was in her 66s when she passed away.  Patient has a son and daughter-in-law who reside here in [place].  Father died at age 78 of cardiac arrest.  She said father had history of epilepsy.  Grandmother died of suicide.  Both grandfathers reportedly had history of cancer.  Patient declines breast exam and GYN exam    Review of Systems shortness of breath with exertion.  Is mostly sedentary.  No  chest pain.  No recent respiratory infections.     Objective:   Physical Exam  Constitutional: She is oriented to person, place, and time. She appears well-developed and well-nourished.  HENT:  Head: Normocephalic and atraumatic.  Right Ear: External ear normal.  Left Ear: External ear normal.  Mouth/Throat: Oropharynx is clear and moist. No oropharyngeal exudate.  Eyes: Pupils are equal, round, and reactive to light. Conjunctivae and EOM are normal. Right eye exhibits no discharge. Left eye exhibits no discharge.  Neck: Neck supple. No JVD present. No thyromegaly present.  Cardiovascular: Normal rate, regular rhythm, normal heart sounds and intact distal pulses.  No murmur heard. Pulmonary/Chest: Effort normal and breath sounds normal. No stridor. No respiratory distress. She has no wheezes. She has no rales.  Patient declines breast exam  Abdominal: Soft. Bowel sounds are normal. She exhibits no distension and no mass. There is no tenderness. There is no guarding.  Genitourinary:  Genitourinary Comments: Patient declines  Musculoskeletal: She exhibits no edema.  Lymphadenopathy:    She has no cervical adenopathy.  Neurological: She is alert and oriented to person, place, and time. She displays normal reflexes. No cranial nerve deficit or sensory deficit. She exhibits normal muscle tone. Coordination normal.  Skin: Skin is warm and dry.  Psychiatric: She has a normal mood and affect. Her behavior is normal. Judgment and thought content normal.  Vitals reviewed.         Assessment & Plan:  COPD with oxygen dependence- may be getting a bit worse and has been advised by Dr. Annamaria Boots to turn up her O2 to 3 L with exertion.  Has inhalers on hand.  History of heavy smoking but quit in 2007.  History of hyperlipidemia treated with statin.  History of fatty liver with elevated liver enzymes.  History of chronic diarrhea treated with Questran.  History of lung cancer right lung former  heavy smoker.  Underwent radiation therapy.  Has declined systemic chemotherapy.  Declines pelvic exam and mammogram as well as breast exam.  Interestingly she still drives, ambulates slowly and has not fallen.  History of left breast cancer 2015.  Recurrence was thought to be low risk.  Plan: We are honoring her wishes for no further radiation or systemic chemotherapy.  Continue symptomatic treatment for shortness of breath with inhalers and home O2.  Return in 1 year or as needed.  In February 2019, was noted to have a persistently low white blood count of 69,000 and had been 68,000 the previous year and in the 80,000 range a couple of years ago.  Was 112,000  3 years ago.  Was 140,000 in 2015.  Her lipid panel was normal in February as well as TSH.  C met was normal with exception of CO2 of 30.  Subjective:   Patient presents for Medicare Annual/Subsequent preventive examination.  Review Past Medical/Family/Social: See above   Risk Factors  Current exercise  habits: Sedentary and O2 dependent Dietary issues discussed: Tries to eat well  Cardiac risk factors: Hyperlipidemia  Depression Screen  (Note: if answer to either of the following is "Yes", a more complete depression screening is indicated)   Over the past two weeks, have you felt down, depressed or hopeless? No  Over the past two weeks, have you felt little interest or pleasure in doing things? No Have you lost interest or pleasure in daily life? No Do you often feel hopeless? No Do you cry easily over simple problems? No   Activities of Daily Living  In your present state of health, do you have any difficulty performing the following activities?:   Driving? No  Managing money? No  Feeding yourself? No  Getting from bed to chair? No  Climbing a flight of stairs? No  Preparing food and eating?: No  Bathing or showering? No  Getting dressed: No  Getting to the toilet? No  Using the toilet:No  Moving around from  place to place: No  In the past year have you fallen or had a near fall?:No  Are you sexually active? No  Do you have more than one partner? No   Hearing Difficulties: No  Do you often ask people to speak up or repeat themselves? No  Do you experience ringing or noises in your ears? No  Do you have difficulty understanding soft or whispered voices? No  Do you feel that you have a problem with memory? No Do you often misplace items? No    Home Safety:  Do you have a smoke alarm at your residence? Yes Do you have grab bars in the bathroom?  Yes Do you have throw rugs in your house?  No  Cognitive Testing  Alert? Yes Normal Appearance?Yes  Oriented to person? Yes Place? Yes  Time? Yes  Recall of three objects? Yes  Can perform simple calculations? Yes  Displays appropriate judgment?Yes  Can read the correct time from a watch face?Yes   List the Names of Other Physician/Practitioners you currently use:  See referral list for the physicians patient is currently seeing.     Review of Systems: See above   Objective:     General appearance: Appears stated age and mildly obese  Head: Normocephalic, without obvious abnormality, atraumatic  Eyes: conj clear, EOMi PEERLA  Ears: normal TM's and external ear canals both ears  Nose: Nares normal. Septum midline. Mucosa normal. No drainage or sinus tenderness.  Throat: lips, mucosa, and tongue normal; teeth and gums normal  Neck: no adenopathy, no carotid bruit, no JVD, supple, symmetrical, trachea midline and thyroid not enlarged, symmetric, no tenderness/mass/nodules  No CVA tenderness.  Lungs: clear to auscultation bilaterally  Breasts: Declines exam Heart: regular rate and rhythm, S1, S2 normal, no murmur, click, rub or gallop  Abdomen: soft, non-tender; bowel sounds normal; no masses, no organomegaly  Musculoskeletal: ROM normal in all joints, no crepitus, no deformity, Normal muscle strengthen. Back  is symmetric, no  curvature. Skin: Skin color, texture, turgor normal. No rashes or lesions  Lymph nodes: Cervical, supraclavicular, and axillary nodes normal.  Neurologic: CN 2 -12 Normal, Normal symmetric reflexes. Normal coordination and gait  Psych: Alert & Oriented x 3, Mood appear stable.    Assessment:    Annual wellness medicare exam   Plan:    During the course of the visit the patient was educated and counseled about appropriate screening and preventive services including:   Took flu vaccine.  Has  had pneumococcal vaccines.  Tetanus immunization is up-to-date.      Patient Instructions (the written plan) was given to the patient.  Medicare Attestation  I have personally reviewed:  The patient's medical and social history  Their use of alcohol, tobacco or illicit drugs  Their current medications and supplements  The patient's functional ability including ADLs,fall risks, home safety risks, cognitive, and hearing and visual impairment  Diet and physical activities  Evidence for depression or mood disorders  The patient's weight, height, BMI, and visual acuity have been recorded in the chart. I have made referrals, counseling, and provided education to the patient based on review of the above and I have provided the patient with a written personalized care plan for preventive services.

## 2018-05-01 NOTE — Patient Instructions (Signed)
She is indicated she does not want to have aggressive therapy for her lung cancer.  We will honor these wishes.  She is oxygen dependent at 2 to 3 L/min.  Continue with statin therapy.  Flu vaccine given.  Return in 1 year or as needed.

## 2018-05-03 ENCOUNTER — Telehealth: Payer: Self-pay

## 2018-05-03 ENCOUNTER — Other Ambulatory Visit: Payer: Self-pay

## 2018-05-03 DIAGNOSIS — C7951 Secondary malignant neoplasm of bone: Secondary | ICD-10-CM

## 2018-05-03 MED ORDER — OXYCODONE-ACETAMINOPHEN 5-325 MG PO TABS: 1.0000 | ORAL_TABLET | Freq: Four times a day (QID) | ORAL | 0 refills | Status: DC | PRN

## 2018-05-03 NOTE — Telephone Encounter (Signed)
Returned patient's call regarding refill for Percocet.  RX was approved by Dr. Lindi Adie - Patient aware that RX is available for pick up.  Hard script placed in triage prescription book.  Patient with no further needs at this time.

## 2018-05-06 ENCOUNTER — Other Ambulatory Visit: Payer: Self-pay | Admitting: Internal Medicine

## 2018-05-08 DIAGNOSIS — J449 Chronic obstructive pulmonary disease, unspecified: Secondary | ICD-10-CM | POA: Diagnosis not present

## 2018-05-23 DIAGNOSIS — J449 Chronic obstructive pulmonary disease, unspecified: Secondary | ICD-10-CM | POA: Diagnosis not present

## 2018-06-07 ENCOUNTER — Telehealth: Payer: Self-pay | Admitting: Oncology

## 2018-06-07 NOTE — Telephone Encounter (Signed)
Called pt re appt being moved due to [facility] having a half day - pt did not want earlier appointment and said that she would call back to reschedule.

## 2018-06-08 DIAGNOSIS — F251 Schizoaffective disorder, depressive type: Secondary | ICD-10-CM | POA: Diagnosis not present

## 2018-06-08 DIAGNOSIS — J449 Chronic obstructive pulmonary disease, unspecified: Secondary | ICD-10-CM | POA: Diagnosis not present

## 2018-06-10 ENCOUNTER — Ambulatory Visit: Payer: PPO | Admitting: Hematology and Oncology

## 2018-06-12 ENCOUNTER — Other Ambulatory Visit: Payer: Self-pay | Admitting: Hematology and Oncology

## 2018-06-12 DIAGNOSIS — C50312 Malignant neoplasm of lower-inner quadrant of left female breast: Secondary | ICD-10-CM

## 2018-06-23 DIAGNOSIS — J449 Chronic obstructive pulmonary disease, unspecified: Secondary | ICD-10-CM | POA: Diagnosis not present

## 2018-07-09 DIAGNOSIS — J449 Chronic obstructive pulmonary disease, unspecified: Secondary | ICD-10-CM | POA: Diagnosis not present

## 2018-07-24 DIAGNOSIS — J449 Chronic obstructive pulmonary disease, unspecified: Secondary | ICD-10-CM | POA: Diagnosis not present

## 2018-08-02 ENCOUNTER — Other Ambulatory Visit: Payer: Self-pay | Admitting: Hematology and Oncology

## 2018-08-02 DIAGNOSIS — C7951 Secondary malignant neoplasm of bone: Secondary | ICD-10-CM

## 2018-08-02 MED ORDER — OXYCODONE-ACETAMINOPHEN 5-325 MG PO TABS: 1.0000 | ORAL_TABLET | Freq: Four times a day (QID) | ORAL | 0 refills | Status: DC | PRN

## 2018-08-07 DIAGNOSIS — J449 Chronic obstructive pulmonary disease, unspecified: Secondary | ICD-10-CM | POA: Diagnosis not present

## 2018-08-22 DIAGNOSIS — J449 Chronic obstructive pulmonary disease, unspecified: Secondary | ICD-10-CM | POA: Diagnosis not present

## 2018-09-06 ENCOUNTER — Other Ambulatory Visit: Payer: Self-pay | Admitting: Hematology and Oncology

## 2018-09-06 DIAGNOSIS — C50312 Malignant neoplasm of lower-inner quadrant of left female breast: Secondary | ICD-10-CM

## 2018-09-07 DIAGNOSIS — J449 Chronic obstructive pulmonary disease, unspecified: Secondary | ICD-10-CM | POA: Diagnosis not present

## 2018-09-09 ENCOUNTER — Ambulatory Visit: Payer: PPO | Admitting: Internal Medicine

## 2018-09-22 DIAGNOSIS — J449 Chronic obstructive pulmonary disease, unspecified: Secondary | ICD-10-CM | POA: Diagnosis not present

## 2018-09-26 ENCOUNTER — Emergency Department (HOSPITAL_COMMUNITY): Payer: PPO

## 2018-09-26 ENCOUNTER — Encounter (HOSPITAL_COMMUNITY): Payer: Self-pay | Admitting: Emergency Medicine

## 2018-09-26 ENCOUNTER — Other Ambulatory Visit: Payer: Self-pay

## 2018-09-26 ENCOUNTER — Inpatient Hospital Stay (HOSPITAL_COMMUNITY)
Admission: EM | Admit: 2018-09-26 | Discharge: 2018-10-04 | DRG: 492 | Disposition: A | Discharge status: Home Health | Payer: PPO | Attending: Internal Medicine | Admitting: Internal Medicine

## 2018-09-26 DIAGNOSIS — J441 Chronic obstructive pulmonary disease with (acute) exacerbation: Secondary | ICD-10-CM

## 2018-09-26 DIAGNOSIS — S52571A Other intraarticular fracture of lower end of right radius, initial encounter for closed fracture: Secondary | ICD-10-CM

## 2018-09-26 DIAGNOSIS — S42251A Displaced fracture of greater tuberosity of right humerus, initial encounter for closed fracture: Secondary | ICD-10-CM | POA: Diagnosis present

## 2018-09-26 DIAGNOSIS — D62 Acute posthemorrhagic anemia: Secondary | ICD-10-CM | POA: Diagnosis not present

## 2018-09-26 DIAGNOSIS — J9610 Chronic respiratory failure, unspecified whether with hypoxia or hypercapnia: Secondary | ICD-10-CM | POA: Diagnosis present

## 2018-09-26 DIAGNOSIS — F259 Schizoaffective disorder, unspecified: Secondary | ICD-10-CM | POA: Diagnosis present

## 2018-09-26 DIAGNOSIS — S42291A Other displaced fracture of upper end of right humerus, initial encounter for closed fracture: Secondary | ICD-10-CM | POA: Diagnosis not present

## 2018-09-26 DIAGNOSIS — D72829 Elevated white blood cell count, unspecified: Secondary | ICD-10-CM | POA: Diagnosis not present

## 2018-09-26 DIAGNOSIS — F251 Schizoaffective disorder, depressive type: Secondary | ICD-10-CM | POA: Diagnosis present

## 2018-09-26 DIAGNOSIS — R945 Abnormal results of liver function studies: Secondary | ICD-10-CM | POA: Diagnosis not present

## 2018-09-26 DIAGNOSIS — R7989 Other specified abnormal findings of blood chemistry: Secondary | ICD-10-CM

## 2018-09-26 DIAGNOSIS — E559 Vitamin D deficiency, unspecified: Secondary | ICD-10-CM | POA: Diagnosis present

## 2018-09-26 DIAGNOSIS — C342 Malignant neoplasm of middle lobe, bronchus or lung: Secondary | ICD-10-CM | POA: Diagnosis present

## 2018-09-26 DIAGNOSIS — I10 Essential (primary) hypertension: Secondary | ICD-10-CM | POA: Diagnosis present

## 2018-09-26 DIAGNOSIS — K5790 Diverticulosis of intestine, part unspecified, without perforation or abscess without bleeding: Secondary | ICD-10-CM | POA: Diagnosis present

## 2018-09-26 DIAGNOSIS — E785 Hyperlipidemia, unspecified: Secondary | ICD-10-CM | POA: Diagnosis present

## 2018-09-26 DIAGNOSIS — Z9842 Cataract extraction status, left eye: Secondary | ICD-10-CM | POA: Diagnosis not present

## 2018-09-26 DIAGNOSIS — M79603 Pain in arm, unspecified: Secondary | ICD-10-CM | POA: Diagnosis not present

## 2018-09-26 DIAGNOSIS — S065X0A Traumatic subdural hemorrhage without loss of consciousness, initial encounter: Secondary | ICD-10-CM | POA: Diagnosis present

## 2018-09-26 DIAGNOSIS — S01111A Laceration without foreign body of right eyelid and periocular area, initial encounter: Secondary | ICD-10-CM | POA: Diagnosis not present

## 2018-09-26 DIAGNOSIS — Z806 Family history of leukemia: Secondary | ICD-10-CM

## 2018-09-26 DIAGNOSIS — Y92003 Bedroom of unspecified non-institutional (private) residence as the place of occurrence of the external cause: Secondary | ICD-10-CM

## 2018-09-26 DIAGNOSIS — D6959 Other secondary thrombocytopenia: Secondary | ICD-10-CM | POA: Diagnosis present

## 2018-09-26 DIAGNOSIS — D509 Iron deficiency anemia, unspecified: Secondary | ICD-10-CM

## 2018-09-26 DIAGNOSIS — S0181XA Laceration without foreign body of other part of head, initial encounter: Secondary | ICD-10-CM | POA: Diagnosis present

## 2018-09-26 DIAGNOSIS — Z23 Encounter for immunization: Secondary | ICD-10-CM

## 2018-09-26 DIAGNOSIS — S42209A Unspecified fracture of upper end of unspecified humerus, initial encounter for closed fracture: Secondary | ICD-10-CM

## 2018-09-26 DIAGNOSIS — Z9841 Cataract extraction status, right eye: Secondary | ICD-10-CM | POA: Diagnosis not present

## 2018-09-26 DIAGNOSIS — S3993XA Unspecified injury of pelvis, initial encounter: Secondary | ICD-10-CM | POA: Diagnosis not present

## 2018-09-26 DIAGNOSIS — W19XXXA Unspecified fall, initial encounter: Secondary | ICD-10-CM | POA: Diagnosis not present

## 2018-09-26 DIAGNOSIS — Z85828 Personal history of other malignant neoplasm of skin: Secondary | ICD-10-CM

## 2018-09-26 DIAGNOSIS — S42401A Unspecified fracture of lower end of right humerus, initial encounter for closed fracture: Secondary | ICD-10-CM | POA: Diagnosis not present

## 2018-09-26 DIAGNOSIS — Z79899 Other long term (current) drug therapy: Secondary | ICD-10-CM | POA: Diagnosis not present

## 2018-09-26 DIAGNOSIS — S0011XA Contusion of right eyelid and periocular area, initial encounter: Secondary | ICD-10-CM | POA: Diagnosis present

## 2018-09-26 DIAGNOSIS — W06XXXA Fall from bed, initial encounter: Secondary | ICD-10-CM | POA: Diagnosis present

## 2018-09-26 DIAGNOSIS — S42411A Displaced simple supracondylar fracture without intercondylar fracture of right humerus, initial encounter for closed fracture: Secondary | ICD-10-CM | POA: Diagnosis present

## 2018-09-26 DIAGNOSIS — G8918 Other acute postprocedural pain: Secondary | ICD-10-CM | POA: Diagnosis not present

## 2018-09-26 DIAGNOSIS — Z66 Do not resuscitate: Secondary | ICD-10-CM | POA: Diagnosis present

## 2018-09-26 DIAGNOSIS — S52501A Unspecified fracture of the lower end of right radius, initial encounter for closed fracture: Secondary | ICD-10-CM | POA: Diagnosis not present

## 2018-09-26 DIAGNOSIS — S52614A Nondisplaced fracture of right ulna styloid process, initial encounter for closed fracture: Secondary | ICD-10-CM | POA: Diagnosis present

## 2018-09-26 DIAGNOSIS — S0083XA Contusion of other part of head, initial encounter: Secondary | ICD-10-CM | POA: Diagnosis not present

## 2018-09-26 DIAGNOSIS — Z803 Family history of malignant neoplasm of breast: Secondary | ICD-10-CM

## 2018-09-26 DIAGNOSIS — Z87891 Personal history of nicotine dependence: Secondary | ICD-10-CM

## 2018-09-26 DIAGNOSIS — S065XAA Traumatic subdural hemorrhage with loss of consciousness status unknown, initial encounter: Secondary | ICD-10-CM | POA: Diagnosis present

## 2018-09-26 DIAGNOSIS — S065X9A Traumatic subdural hemorrhage with loss of consciousness of unspecified duration, initial encounter: Secondary | ICD-10-CM

## 2018-09-26 DIAGNOSIS — Z7982 Long term (current) use of aspirin: Secondary | ICD-10-CM

## 2018-09-26 DIAGNOSIS — Z853 Personal history of malignant neoplasm of breast: Secondary | ICD-10-CM

## 2018-09-26 DIAGNOSIS — J449 Chronic obstructive pulmonary disease, unspecified: Secondary | ICD-10-CM | POA: Diagnosis not present

## 2018-09-26 DIAGNOSIS — D696 Thrombocytopenia, unspecified: Secondary | ICD-10-CM | POA: Diagnosis present

## 2018-09-26 DIAGNOSIS — Z9981 Dependence on supplemental oxygen: Secondary | ICD-10-CM

## 2018-09-26 DIAGNOSIS — Z419 Encounter for procedure for purposes other than remedying health state, unspecified: Secondary | ICD-10-CM

## 2018-09-26 DIAGNOSIS — R609 Edema, unspecified: Secondary | ICD-10-CM | POA: Diagnosis not present

## 2018-09-26 DIAGNOSIS — Z8041 Family history of malignant neoplasm of ovary: Secondary | ICD-10-CM

## 2018-09-26 DIAGNOSIS — Z801 Family history of malignant neoplasm of trachea, bronchus and lung: Secondary | ICD-10-CM

## 2018-09-26 DIAGNOSIS — R52 Pain, unspecified: Secondary | ICD-10-CM | POA: Diagnosis not present

## 2018-09-26 DIAGNOSIS — R000 Tachycardia, unspecified: Secondary | ICD-10-CM | POA: Diagnosis not present

## 2018-09-26 DIAGNOSIS — Z9049 Acquired absence of other specified parts of digestive tract: Secondary | ICD-10-CM

## 2018-09-26 DIAGNOSIS — D649 Anemia, unspecified: Secondary | ICD-10-CM | POA: Diagnosis not present

## 2018-09-26 DIAGNOSIS — R0602 Shortness of breath: Secondary | ICD-10-CM | POA: Diagnosis not present

## 2018-09-26 DIAGNOSIS — Z923 Personal history of irradiation: Secondary | ICD-10-CM

## 2018-09-26 DIAGNOSIS — Z9012 Acquired absence of left breast and nipple: Secondary | ICD-10-CM

## 2018-09-26 LAB — CBC WITH DIFFERENTIAL/PLATELET
Abs Immature Granulocytes: 0.22 10*3/uL — ABNORMAL HIGH (ref 0.00–0.07)
Basophils Absolute: 0 10*3/uL (ref 0.0–0.1)
Basophils Relative: 0 %
Eosinophils Absolute: 0 10*3/uL (ref 0.0–0.5)
Eosinophils Relative: 0 %
HCT: 31.9 % — ABNORMAL LOW (ref 36.0–46.0)
Hemoglobin: 9.7 g/dL — ABNORMAL LOW (ref 12.0–15.0)
Immature Granulocytes: 2 %
Lymphocytes Relative: 7 %
Lymphs Abs: 0.7 10*3/uL (ref 0.7–4.0)
MCH: 27.6 pg (ref 26.0–34.0)
MCHC: 30.4 g/dL (ref 30.0–36.0)
MCV: 90.6 fL (ref 80.0–100.0)
Monocytes Absolute: 2.9 10*3/uL — ABNORMAL HIGH (ref 0.1–1.0)
Monocytes Relative: 30 %
Neutro Abs: 5.9 10*3/uL (ref 1.7–7.7)
Neutrophils Relative %: 61 %
Platelets: 38 10*3/uL — ABNORMAL LOW (ref 150–400)
RBC: 3.52 MIL/uL — ABNORMAL LOW (ref 3.87–5.11)
RDW: 15.1 % (ref 11.5–15.5)
WBC: 9.8 10*3/uL (ref 4.0–10.5)
nRBC: 0 % (ref 0.0–0.2)

## 2018-09-26 LAB — BASIC METABOLIC PANEL
Anion gap: 6 (ref 5–15)
BUN: 14 mg/dL (ref 8–23)
CO2: 32 mmol/L (ref 22–32)
Calcium: 9 mg/dL (ref 8.9–10.3)
Chloride: 102 mmol/L (ref 98–111)
Creatinine, Ser: 0.66 mg/dL (ref 0.44–1.00)
GFR calc Af Amer: >60 mL/min (ref >60)
GFR calc non Af Amer: >60 mL/min (ref >60)
Glucose, Bld: 116 mg/dL — ABNORMAL HIGH (ref 70–99)
Potassium: 4 mmol/L (ref 3.5–5.1)
Sodium: 140 mmol/L (ref 135–145)

## 2018-09-26 MED ORDER — ALBUTEROL SULFATE HFA 108 (90 BASE) MCG/ACT IN AERS
6.0000 | INHALATION_SPRAY | Freq: Once | RESPIRATORY_TRACT | Status: AC
  Administered 2018-09-26: 6 via RESPIRATORY_TRACT

## 2018-09-26 MED ORDER — ALBUTEROL SULFATE HFA 108 (90 BASE) MCG/ACT IN AERS
2.0000 | INHALATION_SPRAY | Freq: Once | RESPIRATORY_TRACT | Status: AC
  Administered 2018-09-26: 21:00:00 2 via RESPIRATORY_TRACT
  Filled 2018-09-26: qty 6.7

## 2018-09-26 MED ORDER — METHYLPREDNISOLONE SODIUM SUCC 125 MG IJ SOLR
125.0000 mg | Freq: Once | INTRAMUSCULAR | Status: AC
  Administered 2018-09-26: 21:00:00 125 mg via INTRAVENOUS
  Filled 2018-09-26: qty 2

## 2018-09-26 MED ORDER — SODIUM CHLORIDE 0.9% IV SOLUTION
Freq: Once | INTRAVENOUS | Status: AC
  Administered 2018-09-27: 04:00:00 via INTRAVENOUS

## 2018-09-26 MED ORDER — MAGNESIUM SULFATE 2 GM/50ML IV SOLN
2.0000 g | Freq: Once | INTRAVENOUS | Status: AC
  Administered 2018-09-26: 2 g via INTRAVENOUS
  Filled 2018-09-26: qty 50

## 2018-09-26 NOTE — ED Notes (Signed)
Pa in with pt at this moment. RN Anderson Malta informed will return for blood draw

## 2018-09-26 NOTE — ED Provider Notes (Signed)
[facility] EMERGENCY DEPARTMENT Provider Note   CSN: [id number] Arrival date & time: 09/26/18  2009  History   Chief Complaint Fall  HPI Ester Hilley Riordan is a 73 y.o. female with past medical history significant for COPD on 3.5 L oxygen nasal cannula, Shizoaffective disorder who presents for evaluation after mechanical fall.  Patient states she was in bed when she went to exit the bed to use the restroom her pant leg got stuck under her other foot which caused her to fall and hit the right side of her head as well as her right shoulder on the floor.  She states she noted immediate pain to her right face as well as her right wrist and right shoulder.  Denies LOC or anticoagulation.  Patient states she was able to call 911 for help getting up.  Incident occurred just PTA.  She denies preceding thunderclap headache, dizziness, chest pain, shortness of breath.  Patient states her breathing is at her baseline. She does not use inhalers or nebulizers.  She is not on any oral steroids for COPD.  Denies headache, vision changes, midline neck or midline back pain, chest pain, shortness of breath, cough, abdominal pain, diarrhea, dysuria, pain to her lower extremities, numbness or tingling to her extremities, bowel or bladder incontinence, saddle paresthesia.  She does have pain to her right periorbital area as well as her right shoulder and right wrist.  Has not taken anything for the symptoms.  She rates her pain a 6/10.  Denies radiation of pain. States she has HX of breast cancer s/p mastectomy on left as well as lung cancer. Last saw Onc 4 months ago. Patient is unsure if any active disease.  History obtained from patient.  No interpreter was used.  Patient is a DNR/DNI. Confirmed with patient and son via phone.  Denies recent travel or exposure to COVID 19 positive patients.    HPI  Past Medical History:  Diagnosis Date   Breast cancer ([place]) 01/05/14   left breast   COPD  (chronic obstructive pulmonary disease) (HCC)    Depression    Diverticulosis    History of alcohol abuse    last 1980   Hyperlipidemia    Internal hemorrhoids    Lung cancer ([place])    Oxygen deficiency    Personal history of radiation therapy 2015   Radiation 03/23/14-04/19/14   Left breast   Radiation 12/13/14-01/19/15   right upper/middle lobe of lung 70.2 Gy   Requires supplemental oxygen    at night USES 2 LITERS    Shortness of breath     WITH ACTIVITY   Skin cancer    Squamous   Vitamin D deficiency    Wears glasses     Patient Active Problem List   Diagnosis Date Noted   Subdural hematoma ([place]) 09/27/2018   Closed displaced fracture of greater tuberosity of right humerus    Closed nondisplaced fracture of styloid process of right ulna    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   Chronic respiratory failure with hypoxia ([place]) 05/05/2017   Bone metastasis ([place]) 03/20/2016   DNR (do not resuscitate) 03/10/2016   Pain from bone metastases ([place]) 03/10/2016   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 ([place]) 11/21/2014   Nonspecific abnormal finding in stool contents 11/08/2014   Diverticulosis of colon without hemorrhage 11/08/2014   Internal hemorrhoids 11/08/2014   Abnormal CT scan, chest 11/06/2014   Encounter for screening colonoscopy 08/22/2014   Colon  cancer screening 08/22/2014   Diarrhea 03/01/2014   Cancer of lower-inner quadrant of left female breast ([place]) 01/09/2014   Dyspnea 08/30/2012   Elevated liver enzymes 05/03/2011   Hyperlipidemia 04/11/2011   Schizoaffective disorder ([place]) 04/11/2011   COPD exacerbation ([place]) 04/11/2011   Vitamin D deficiency 04/11/2011    Past Surgical History:  Procedure Laterality Date   BREAST BIOPSY Left 01/15/2014   x2   BREAST LUMPECTOMY Left 2015   radiation   BREAST SURGERY  02/09/2014   Left luimpectomy   CHOLECYSTECTOMY  2003   COLONOSCOPY WITH PROPOFOL N/A  11/08/2014   Procedure: COLONOSCOPY WITH PROPOFOL;  Surgeon: Inda Castle, MD;  Location: [facility];  Service: Endoscopy;  Laterality: N/A;   DENTAL SURGERY     implants   EYE SURGERY Bilateral 2005   Cataract OD   VIDEO BRONCHOSCOPY WITH ENDOBRONCHIAL NAVIGATION N/A 11/10/2014   Procedure: VIDEO BRONCHOSCOPY WITH ENDOBRONCHIAL NAVIGATION;  Surgeon: Melrose Nakayama, MD;  Location: [facility];  Service: Thoracic;  Laterality: N/A;     OB History   No obstetric history on file.    Obstetric Comments  Menarche age 69 First live birth age 56, 35 son No birth control pills No hormonal replacement therapy         Home Medications    Prior to Admission medications   Medication Sig Start Date End Date Taking? Authorizing Provider  albuterol (PROVENTIL) (2.5 MG/3ML) 0.083% nebulizer solution Take 3 mLs (2.5 mg total) by nebulization every 6 (six) hours as needed for wheezing or shortness of breath. 05/09/16   Baird Lyons D, MD  anastrozole (ARIMIDEX) 1 MG tablet TAKE 1 TABLET BY MOUTH EVERY DAY 09/06/18   Nicholas Lose, MD  arformoterol (BROVANA) 15 MCG/2ML NEBU 1 neb every 8 hours if needed 06/25/17   Baird Lyons D, MD  aspirin 81 MG tablet Take 1 tablet (81 mg total) by mouth daily. 04/28/16   Nicholas Lose, MD  atorvastatin (LIPITOR) 40 MG tablet TAKE 1 TABLET BY MOUTH EVERY DAY 05/07/18   Elby Showers, MD  Cholecalciferol (VITAMIN D) 2000 UNITS CAPS Take 1 capsule by mouth daily.     [provider]  cholestyramine (QUESTRAN) 4 g packet TAKE 1 PACKET (4 G TOTAL) BY MOUTH 2 (TWO) TIMES DAILY. 01/05/18   Yetta Flock, MD  citalopram (CELEXA) 20 MG tablet  09/14/16   [provider]  loratadine (CLARITIN) 10 MG tablet Take 10 mg by mouth daily.    [provider]  magnesium oxide (MAG-OX) 400 MG tablet Take 400 mg by mouth daily.    [provider]  Multiple Vitamins-Minerals (MULTIVITAMIN GUMMIES ADULT PO) Take 2 tablets by mouth daily.      [provider]  OLANZapine (ZYPREXA) 7.5 MG tablet Take 7.5 mg by mouth daily. 10/01/14   [provider]  Omega-3 Fatty Acids (FISH OIL) 1200 MG CAPS Take 1 capsule by mouth daily.    [provider]  oxyCODONE-acetaminophen (PERCOCET/ROXICET) 5-325 MG tablet Take 1 tablet by mouth every 6 (six) hours as needed for severe pain. 08/02/18   Nicholas Lose, MD  Probiotic Product (PROBIOTIC DAILY PO) Take 2 tablets by mouth daily. Probiotic gummy    [provider]  umeclidinium-vilanterol (ANORO ELLIPTA) 62.5-25 MCG/INH AEPB Inhale 1 puff into the lungs daily. 03/08/18   Deneise Lever, MD    Family History Family History  Problem Relation Age of Onset   Heart disease Father    Lung cancer  Maternal Grandfather    Leukemia Paternal Grandfather    Cholelithiasis Mother    Ovarian cancer Maternal Aunt    Breast cancer Maternal Aunt    Colon cancer Neg Hx    Colon polyps Neg Hx    Esophageal cancer Neg Hx    Kidney disease Neg Hx    Gallbladder disease Neg Hx    Stomach cancer Neg Hx    Rectal cancer Neg Hx     Social History Social History   Tobacco Use   Smoking status: Former Smoker    Packs/day: 1.00    Years: 50.00    Pack years: 50.00    Types: Cigarettes    Last attempt to quit: 08/31/2005    Years since quitting: 13.0   Smokeless tobacco: Never Used  Substance Use Topics   Alcohol use: No    Comment: hx abuse-last 1980   Drug use: No   Allergies   Patient has no known allergies.  Review of Systems Review of Systems  Constitutional: Negative.   HENT: Positive for facial swelling. Negative for dental problem, drooling, ear pain, nosebleeds, postnasal drip, rhinorrhea, sinus pressure, sinus pain, sneezing, sore throat, tinnitus, trouble swallowing and voice change.   Eyes: Negative for photophobia, pain and redness.       Unable to see out of right eye from swelling  Respiratory: Negative.   Cardiovascular: Negative.    Gastrointestinal: Negative.   Genitourinary: Negative.   Musculoskeletal:       Right shoulder and right wrist pain.  Skin: Positive for wound.  Neurological: Negative.   All other systems reviewed and are negative.  Physical Exam Updated Vital Signs BP 98/81    Pulse (!) 137    Temp 98.7 F (37.1 C) (Oral)    Resp (!) 29    Ht 5\' 4"  (1.626 m)    Wt 76.2 kg    SpO2 98%    BMI 28.84 kg/m   Physical Exam Vitals signs and nursing note reviewed.  Constitutional:      General: She is not in acute distress.    Appearance: She is well-developed. She is not ill-appearing or toxic-appearing.  HENT:     Head: Normocephalic. Raccoon eyes present. No Battle's sign.     Jaw: There is normal jaw occlusion.      Comments: Swelling and periorbital ecchymosis to right eye.  Patient unable to open eye secondary to swelling.     Nose: Nose normal.     Comments: No pain to nasal bridge or sinuses.  No evidence of septal hematomas or epistaxis.    Mouth/Throat:     Comments: PO clear.  No abnormal dentition.  Mucous membranes moist.  No drooling, dysphasia or trismus.  Uvula midline. Eyes:     Pupils: Pupils are equal, round, and reactive to light.      Comments: EOM on left intake without signs of orbital entrapment. Cannot assess right eye.  Neck:     Musculoskeletal: Full passive range of motion without pain and normal range of motion.     Comments: No midline neck tenderness to palpation.  No stiffness or rigidity.  Phonation normal. Cardiovascular:     Rate and Rhythm: Normal rate.     Pulses: Normal pulses.     Comments: Tachycardic.  No murmurs, rubs or gallops.  Plus DP, PT pulses bilaterally.  2+ radial pulses Pulmonary:     Effort: No respiratory distress.     Breath sounds: Wheezing present.  Comments: Tachypnea, Diffuse wheezing throughout.  Belly breathing. Chest:     Comments: Chest wall TTP.  No tenderness over bilateral ribs.  No crepitus or step-offs. Abdominal:      General: There is no distension.     Comments: Soft, nontender without rebound or guarding.  Normoactive bowel signs.  No overlying abdominal wall skin changes.  Musculoskeletal:     Right shoulder: She exhibits decreased range of motion, tenderness, bony tenderness and swelling. She exhibits no effusion, no crepitus, no deformity, no laceration, no pain, no spasm, normal pulse and normal strength.     Right elbow: She exhibits swelling and effusion. She exhibits normal range of motion, no deformity and no laceration. No tenderness found.     Right wrist: She exhibits decreased range of motion, tenderness, bony tenderness and swelling. She exhibits no effusion, no crepitus, no deformity and no laceration.     Right hip: Normal.     Left hip: Normal.     Right knee: Normal.     Left knee: Normal.     Right ankle: Normal.     Left ankle: Normal.     Cervical back: Normal.     Thoracic back: Normal.     Lumbar back: Normal.     Comments: Decrease range of motion to right shoulder right wrist secondary to pain.  Mild swelling to scaphoid and wrist on RUE.  Full range of motion elbow without difficulty.  Left upper extremity full range of motion without difficulty.  Stable pelvis.  Nontender palpation.  No midline thoracic or lumbar tenderness palpation.  No shortening or rotation of legs.  Full range of motion bilateral lower extremities.  Able to straight leg raise without difficulty.  Feet:     Comments: 2+, DP, PT pulses bilaterally.  No lower extremity edema, erythema, ecchymosis or warmth. Skin:    General: Skin is warm and dry.     Comments: Dried blood to fleece in hands.  Neurological:     General: No focal deficit present.     Mental Status: She is alert and oriented to person, place, and time. Mental status is at baseline.     Cranial Nerves: Cranial nerves are intact.     Sensory: Sensation is intact.     Motor: Motor function is intact.     Coordination: Coordination is intact.      Gait: Gait is intact.     Deep Tendon Reflexes: Reflexes are normal and symmetric.     Comments: Nerves II through XII grossly intact.  No facial droop.  No dysphasia.  Unable to assess DTR to right upper extremity secondary to pain and injury.  AxO x4.  Equal handgrip bilaterally.  Equal shoulder shrug.  Tongue midline without deviation.  Smile symmetrical.  Equal eyebrow raise.  Swallows without difficulty.            ED Treatments / Results  Labs (all labs ordered are listed, but only abnormal results are displayed) Labs Reviewed  CBC WITH DIFFERENTIAL/PLATELET - Abnormal; Notable for the following components:      Result Value   RBC 3.52 (*)    Hemoglobin 9.7 (*)    HCT 31.9 (*)    Platelets 38 (*)    Monocytes Absolute 2.9 (*)    Abs Immature Granulocytes 0.22 (*)    All other components within normal limits  BASIC METABOLIC PANEL - Abnormal; Notable for the following components:   Glucose, Bld 116 (*)  All other components within normal limits  PROTIME-INR  APTT  BASIC METABOLIC PANEL  HEPATIC FUNCTION PANEL  CBC WITH DIFFERENTIAL/PLATELET  TYPE AND SCREEN  PREPARE PLATELET PHERESIS    EKG EKG Interpretation  Date/Time:  Sunday September 26 2018 20:15:42 EDT Ventricular Rate:  127 PR Interval:    QRS Duration: 80 QT Interval:  320 QTC Calculation: 466 R Axis:   -89 Text Interpretation:  Sinus tachycardia Left anterior fascicular block Low voltage, precordial leads No previous tracing Confirmed by Lajean Saver [id number]) on 09/26/2018 10:15:59 PM   Radiology Dg Chest 2 View  Result Date: 09/26/2018 CLINICAL DATA:  Shortness of breath EXAM: CHEST - 2 VIEW COMPARISON:  09/03/2017, PET CT 11/24/2016 FINDINGS: Masslike opacity and architectural distortion in the right hilar region, slightly more opaque. Volume loss in the right hemithorax consistent with post treatment change. No pleural effusion. Stable cardiomediastinal silhouette with aortic atherosclerosis. No  pneumothorax. Subtle fracture proximal right humerus. Age indeterminate right seventh rib fracture. IMPRESSION: 1. Negative for pneumothorax. 2. Masslike opacity with architectural distortion in the right hilar region, possibly due to post treatment change however this region appears slightly more opaque compared to prior and recurrent mass is possible. 3. Subtle fracture involving the proximal right humerus. 4. Age indeterminate right seventh rib fracture Electronically Signed   By: Donavan Foil M.D.   On: 09/26/2018 21:54   Dg Pelvis 1-2 Views  Result Date: 09/26/2018 CLINICAL DATA:  Fall EXAM: PELVIS - 1-2 VIEW COMPARISON:  None. FINDINGS: SI joints are intact. Pubic symphysis and rami show no fracture. No fracture or dislocation is evident. IMPRESSION: No acute osseous abnormality Electronically Signed   By: Donavan Foil M.D.   On: 09/26/2018 21:44   Dg Shoulder Right  Result Date: 09/26/2018 CLINICAL DATA:  Fall EXAM: RIGHT SHOULDER - 2+ VIEW COMPARISON:  None. FINDINGS: Acute mildly comminuted fracture at the greater tuberosity of the humerus. Suspected faint transverse lucency through the neck of the humerus. No dislocation. Mild AC joint degenerative change. Architectural distortion at the right hilus IMPRESSION: 1. Acute comminuted fracture involving the greater tuberosity of the humerus with suspected transverse fracture lucency at the right humeral neck. Electronically Signed   By: Donavan Foil M.D.   On: 09/26/2018 21:43   Dg Elbow Complete Right  Result Date: 09/26/2018 CLINICAL DATA:  Fall EXAM: RIGHT ELBOW - COMPLETE 3+ VIEW COMPARISON:  None. FINDINGS: Acute, mildly comminuted fracture involving the distal humerus above the level of the condyles. About 1/3 bone with ulnar displacement of distal fracture fragment. Associated large elbow effusion. Radial head alignment is within normal limits. IMPRESSION: Acute, mildly comminuted and displaced distal humerus fracture with associated  elbow effusion Electronically Signed   By: Donavan Foil M.D.   On: 09/26/2018 21:48   Dg Forearm Right  Result Date: 09/26/2018 CLINICAL DATA:  Fall EXAM: RIGHT FOREARM - 2 VIEW COMPARISON:  None. FINDINGS: Acute minimally displaced fracture at the ulnar styloid process. Acute impacted intra-articular distal radius fracture with mild dorsal displacement. Incompletely visualized distal humerus fracture. IMPRESSION: 1. Acute impacted intra-articular distal radius fracture. Acute ulnar styloid process fracture 2. Incompletely visualized displaced distal humerus fracture Electronically Signed   By: Donavan Foil M.D.   On: 09/26/2018 21:47   Ct Head Wo Contrast  Result Date: 09/26/2018 CLINICAL DATA:  Initial evaluation for acute trauma, fall. EXAM: CT HEAD WITHOUT CONTRAST CT MAXILLOFACIAL WITHOUT CONTRAST CT CERVICAL SPINE WITHOUT CONTRAST TECHNIQUE: Multidetector CT imaging of the head, cervical spine, and  maxillofacial structures were performed using the standard protocol without intravenous contrast. Multiplanar CT image reconstructions of the cervical spine and maxillofacial structures were also generated. COMPARISON:  None. FINDINGS: CT HEAD FINDINGS Brain: Generalized age-related cerebral atrophy with mild chronic small vessel ischemic disease. Acute subdural hematoma overlies the right cerebral convexity, measuring up to 6 mm in maximal thickness. Additional smaller subdural hemorrhage seen overlying the left parietal convexity, measuring up to 5 mm in maximal diameter. Associated trace 1-2 mm right-to-left shift. No hydrocephalus or ventricular trapping. No other acute intracranial hemorrhage. No acute large vessel territory infarct. No mass lesion. Vascular: No hyperdense vessel. Scattered vascular calcifications noted within the carotid siphons. Skull: Soft tissue contusion at the right periorbital region. Calvarium intact. Other: Trace left mastoid effusion noted, of doubtful significance. CT  MAXILLOFACIAL FINDINGS Osseous: A zygomatic arches are intact. No acute maxillary fracture. Pterygoid plates intact. No acute nasal bone fracture. Mild left-to-right nasal septal deviation without associated fracture. Mandible intact. Mandibular condyles normally situated. No acute abnormality about the dentition. Orbits: Globes intact. Normal retro-orbital hematoma or other pathology. Bony orbits intact without fracture. Sinuses: Paranasal sinuses are clear.  No hemosinus. Soft tissues: Large right periorbital/facial contusion. CT CERVICAL SPINE FINDINGS Alignment: Straightening of the normal cervical lordosis. No listhesis. Skull base and vertebrae: Skull base intact. Normal C1-2 articulations are preserved in the dens is intact. Vertebral body heights maintained. No acute fracture. Soft tissues and spinal canal: Soft tissues of the neck demonstrate no acute finding. No abnormal prevertebral edema. Spinal canal within normal limits. Disc levels: Mild right-sided facet arthrosis at C2-3 and C3-4. No significant spinal stenosis within the cervical spine. Upper chest: Visualized upper chest demonstrates no acute finding. Upper lobe predominant centrilobular emphysema noted. Other: None. IMPRESSION: CT HEAD: 1. Acute bilateral subdural hematomas, right larger than left as above. Associated trace 1-2 mm right-to-left midline shift. 2. No other acute intracranial abnormality. 3. Mild age-related cerebral atrophy with chronic small vessel ischemic disease. CT MAXILLOFACIAL: 1. Large right periorbital/facial soft tissue contusion. 2. No other acute maxillofacial injury. No fracture. Intact globes with no retro-orbital pathology. CT CERVICAL SPINE: No acute traumatic injury within the cervical spine. Critical Value/emergent results were called by telephone at the time of interpretation on 09/26/2018 at 10:50 pm to Dr. Shelby Dubin , who verbally acknowledged these results. Electronically Signed   By: Jeannine Boga  M.D.   On: 09/26/2018 22:52   Ct Cervical Spine Wo Contrast  Result Date: 09/26/2018 CLINICAL DATA:  Initial evaluation for acute trauma, fall. EXAM: CT HEAD WITHOUT CONTRAST CT MAXILLOFACIAL WITHOUT CONTRAST CT CERVICAL SPINE WITHOUT CONTRAST TECHNIQUE: Multidetector CT imaging of the head, cervical spine, and maxillofacial structures were performed using the standard protocol without intravenous contrast. Multiplanar CT image reconstructions of the cervical spine and maxillofacial structures were also generated. COMPARISON:  None. FINDINGS: CT HEAD FINDINGS Brain: Generalized age-related cerebral atrophy with mild chronic small vessel ischemic disease. Acute subdural hematoma overlies the right cerebral convexity, measuring up to 6 mm in maximal thickness. Additional smaller subdural hemorrhage seen overlying the left parietal convexity, measuring up to 5 mm in maximal diameter. Associated trace 1-2 mm right-to-left shift. No hydrocephalus or ventricular trapping. No other acute intracranial hemorrhage. No acute large vessel territory infarct. No mass lesion. Vascular: No hyperdense vessel. Scattered vascular calcifications noted within the carotid siphons. Skull: Soft tissue contusion at the right periorbital region. Calvarium intact. Other: Trace left mastoid effusion noted, of doubtful significance. CT MAXILLOFACIAL FINDINGS Osseous: A zygomatic arches  are intact. No acute maxillary fracture. Pterygoid plates intact. No acute nasal bone fracture. Mild left-to-right nasal septal deviation without associated fracture. Mandible intact. Mandibular condyles normally situated. No acute abnormality about the dentition. Orbits: Globes intact. Normal retro-orbital hematoma or other pathology. Bony orbits intact without fracture. Sinuses: Paranasal sinuses are clear.  No hemosinus. Soft tissues: Large right periorbital/facial contusion. CT CERVICAL SPINE FINDINGS Alignment: Straightening of the normal cervical  lordosis. No listhesis. Skull base and vertebrae: Skull base intact. Normal C1-2 articulations are preserved in the dens is intact. Vertebral body heights maintained. No acute fracture. Soft tissues and spinal canal: Soft tissues of the neck demonstrate no acute finding. No abnormal prevertebral edema. Spinal canal within normal limits. Disc levels: Mild right-sided facet arthrosis at C2-3 and C3-4. No significant spinal stenosis within the cervical spine. Upper chest: Visualized upper chest demonstrates no acute finding. Upper lobe predominant centrilobular emphysema noted. Other: None. IMPRESSION: CT HEAD: 1. Acute bilateral subdural hematomas, right larger than left as above. Associated trace 1-2 mm right-to-left midline shift. 2. No other acute intracranial abnormality. 3. Mild age-related cerebral atrophy with chronic small vessel ischemic disease. CT MAXILLOFACIAL: 1. Large right periorbital/facial soft tissue contusion. 2. No other acute maxillofacial injury. No fracture. Intact globes with no retro-orbital pathology. CT CERVICAL SPINE: No acute traumatic injury within the cervical spine. Critical Value/emergent results were called by telephone at the time of interpretation on 09/26/2018 at 10:50 pm to Dr. Shelby Dubin , who verbally acknowledged these results. Electronically Signed   By: Jeannine Boga M.D.   On: 09/26/2018 22:52   Dg Hand Complete Right  Result Date: 09/26/2018 CLINICAL DATA:  Fall EXAM: RIGHT HAND - COMPLETE 3+ VIEW COMPARISON:  None. FINDINGS: Bones appear osteopenic. Mild arthritis at the first [facility] joint. Degenerative changes at the DIP and PIP joints. Acute nondisplaced fracture ulnar styloid process. Acute, mildly comminuted and impacted intra-articular fracture at the distal radius. IMPRESSION: 1. Acute comminuted impacted intra-articular fracture of the distal radius 2. Acute nondisplaced ulnar styloid process fracture Electronically Signed   By: Donavan Foil M.D.   On:  09/26/2018 21:46   Ct Maxillofacial Wo Contrast  Result Date: 09/26/2018 CLINICAL DATA:  Initial evaluation for acute trauma, fall. EXAM: CT HEAD WITHOUT CONTRAST CT MAXILLOFACIAL WITHOUT CONTRAST CT CERVICAL SPINE WITHOUT CONTRAST TECHNIQUE: Multidetector CT imaging of the head, cervical spine, and maxillofacial structures were performed using the standard protocol without intravenous contrast. Multiplanar CT image reconstructions of the cervical spine and maxillofacial structures were also generated. COMPARISON:  None. FINDINGS: CT HEAD FINDINGS Brain: Generalized age-related cerebral atrophy with mild chronic small vessel ischemic disease. Acute subdural hematoma overlies the right cerebral convexity, measuring up to 6 mm in maximal thickness. Additional smaller subdural hemorrhage seen overlying the left parietal convexity, measuring up to 5 mm in maximal diameter. Associated trace 1-2 mm right-to-left shift. No hydrocephalus or ventricular trapping. No other acute intracranial hemorrhage. No acute large vessel territory infarct. No mass lesion. Vascular: No hyperdense vessel. Scattered vascular calcifications noted within the carotid siphons. Skull: Soft tissue contusion at the right periorbital region. Calvarium intact. Other: Trace left mastoid effusion noted, of doubtful significance. CT MAXILLOFACIAL FINDINGS Osseous: A zygomatic arches are intact. No acute maxillary fracture. Pterygoid plates intact. No acute nasal bone fracture. Mild left-to-right nasal septal deviation without associated fracture. Mandible intact. Mandibular condyles normally situated. No acute abnormality about the dentition. Orbits: Globes intact. Normal retro-orbital hematoma or other pathology. Bony orbits intact without fracture. Sinuses: Paranasal sinuses  are clear.  No hemosinus. Soft tissues: Large right periorbital/facial contusion. CT CERVICAL SPINE FINDINGS Alignment: Straightening of the normal cervical lordosis. No  listhesis. Skull base and vertebrae: Skull base intact. Normal C1-2 articulations are preserved in the dens is intact. Vertebral body heights maintained. No acute fracture. Soft tissues and spinal canal: Soft tissues of the neck demonstrate no acute finding. No abnormal prevertebral edema. Spinal canal within normal limits. Disc levels: Mild right-sided facet arthrosis at C2-3 and C3-4. No significant spinal stenosis within the cervical spine. Upper chest: Visualized upper chest demonstrates no acute finding. Upper lobe predominant centrilobular emphysema noted. Other: None. IMPRESSION: CT HEAD: 1. Acute bilateral subdural hematomas, right larger than left as above. Associated trace 1-2 mm right-to-left midline shift. 2. No other acute intracranial abnormality. 3. Mild age-related cerebral atrophy with chronic small vessel ischemic disease. CT MAXILLOFACIAL: 1. Large right periorbital/facial soft tissue contusion. 2. No other acute maxillofacial injury. No fracture. Intact globes with no retro-orbital pathology. CT CERVICAL SPINE: No acute traumatic injury within the cervical spine. Critical Value/emergent results were called by telephone at the time of interpretation on 09/26/2018 at 10:50 pm to Dr. Shelby Dubin , who verbally acknowledged these results. Electronically Signed   By: Jeannine Boga M.D.   On: 09/26/2018 22:52    Procedures .Critical Care Performed by: Nettie Elm, PA-C Authorized by: Nettie Elm, PA-C   Critical care provider statement:    Critical care time (minutes):  61   Critical care was necessary to treat or prevent imminent or life-threatening deterioration of the following conditions:  CNS failure or compromise   Critical care was time spent personally by me on the following activities:  Discussions with consultants, evaluation of patient's response to treatment, examination of patient, ordering and performing treatments and interventions, ordering and review  of laboratory studies, ordering and review of radiographic studies, pulse oximetry, re-evaluation of patient's condition, obtaining history from patient or surrogate, review of old charts and development of treatment plan with patient or surrogate .Marland KitchenLaceration Repair Date/Time: 09/27/2018 12:02 AM Performed by: Nettie Elm, PA-C Authorized by: Nettie Elm, PA-C   Consent:    Consent obtained:  Verbal   Consent given by:  Patient   Risks discussed:  Infection, need for additional repair, pain, poor cosmetic result and poor wound healing   Alternatives discussed:  No treatment and delayed treatment Universal protocol:    Procedure explained and questions answered to patient or proxy's satisfaction: yes     Relevant documents present and verified: yes     Test results available and properly labeled: yes     Imaging studies available: yes     Required blood products, implants, devices, and special equipment available: yes     Site/side marked: yes     Immediately prior to procedure, a time out was called: yes     Patient identity confirmed:  Verbally with patient Anesthesia (see MAR for exact dosages):    Anesthesia method:  Local infiltration Laceration details:    Location:  Face   Face location:  R eyebrow   Length (cm):  1.2 Repair type:    Repair type:  Simple Pre-procedure details:    Preparation:  Patient was prepped and draped in usual sterile fashion and imaging obtained to evaluate for foreign bodies Exploration:    Hemostasis achieved with:  Direct pressure   Wound exploration: wound explored through full range of motion and entire depth of wound probed and visualized  Treatment:    Area cleansed with:  Betadine   Amount of cleaning:  Standard   Irrigation solution:  Sterile saline Skin repair:    Repair method:  Steri-Strips and tissue adhesive   Number of Steri-Strips:  3 Approximation:    Approximation:  Close Post-procedure details:    Dressing:   Bulky dressing   Patient tolerance of procedure:  Tolerated well, no immediate complications   (including critical care time)  Medications Ordered in ED Medications  0.9 %  sodium chloride infusion (Manually program via Guardrails IV Fluids) (has no administration in time range)  morphine 4 MG/ML injection 4 mg (has no administration in time range)  Tdap (BOOSTRIX) injection 0.5 mL (has no administration in time range)  anastrozole (ARIMIDEX) tablet 1 mg (has no administration in time range)  atorvastatin (LIPITOR) tablet 40 mg (has no administration in time range)  cholestyramine (QUESTRAN) packet 4 g (has no administration in time range)  magnesium oxide (MAG-OX) tablet 400 mg (has no administration in time range)  omega-3 acid ethyl esters (LOVAZA) capsule 1 g (has no administration in time range)  umeclidinium-vilanterol (ANORO ELLIPTA) 62.5-25 MCG/INH 1 puff (has no administration in time range)  acetaminophen (TYLENOL) tablet 650 mg (has no administration in time range)    Or  acetaminophen (TYLENOL) suppository 650 mg (has no administration in time range)  ondansetron (ZOFRAN) tablet 4 mg (has no administration in time range)    Or  ondansetron (ZOFRAN) injection 4 mg (has no administration in time range)  0.9 %  sodium chloride infusion (has no administration in time range)  morphine 2 MG/ML injection 1 mg (has no administration in time range)  albuterol (VENTOLIN HFA) 108 (90 Base) MCG/ACT inhaler 2 puff (2 puffs Inhalation Given 09/26/18 2040)  methylPREDNISolone sodium succinate (SOLU-MEDROL) 125 mg/2 mL injection 125 mg (125 mg Intravenous Given 09/26/18 2040)  albuterol (VENTOLIN HFA) 108 (90 Base) MCG/ACT inhaler 6 puff (6 puffs Inhalation Given 09/26/18 2253)  magnesium sulfate IVPB 2 g 50 mL (0 g Intravenous Hold 09/26/18 2255)   Initial Impression / Assessment and Plan / ED Course  I have reviewed the triage vital signs and the nursing notes.  Pertinent labs & imaging  results that were available during my care of the patient were reviewed by me and considered in my medical decision making (see chart for details).  73 year old female presents for evaluation after mechanical fall.  She did hit her head no LOC.  Denies anticoagulation.  She has swelling and ecchymosis to her right periorbital area.  She is unable to open her eye secondary to swelling.  Denies any pain.  Able to assess for orbital treatment to right eye secondary to patient unable to open eye.  She denies any pain. Tender palpation over anterior right shoulder and right wrist and scaphoid area.  She has full range of motion bilateral lower extremity and left upper extremity that difficulty.  Stable pelvis.  No shortening or rotation of legs.  Abdomen soft, nontender without rebound or guarding.  History of COPD with chronic hypoxia on 3-5 L oxygen nasal cannula at baseline.  She is tachycardic and tachypneic on arrival.  She does have some belly breathing as well as diffuse expiratory wheezing throughout.  She denies any chest pain or shortness of breath.  States her breathing at baseline.  Will give steroids as well as albuterol inhaler.  Will also obtain CT head, cervical as well as plain film chest, pelvis and right upper extremity.  She is neurovascularly intact.  Nonfocal neurologic exam without neurologic deficits.  Patient refuses to undress to do thorough skin exam.  Unfortunately, patient does have bilateral subdural hematomas on CT scan with mild midline shift.  She does not have any evidence of facial fractures or intraorbital process on CT.  The cervical spine is without fracture or dislocation.  She does have multiple fractures to her right upper extremity including comminuted and displaced distal humerus fracture, impacted intra-articular distal radius fracture, acute ulnar styloid fracture, comminuted fracture with greater tuberosity and transverse fracture of her humeral neck.  Will plan for  consult with orthopedics and neurosurgery.  Labs and imaging personally reviewed  Platelets at 38, previous 69, 68.  She is anemic with hemoglobin 9.9.  She denies any prior lightheaded or dizziness.  Denies rectal bleeding. Declines GU exam to assess for occult blood.  Abdomen soft.  Nonsurgical abdomen.  2300: Consulted with Dr.Osterguard, Neurosurgery who recommends medicine admission w/ frequent Neuro checks and he will see patient in morning. Will order platlets per Neurosurgery recommendation.  2310: Consulted with Dr. Alain Marion, orthopedic surgery.  Will evaluate patient in morning for her multiple right upper exrtmity fractures.  I have discussed with patient and son on phone in room patient's CODE STATUS.  Patient is a DNR as well as a DNI.  She is unsure if she would want surgical intervention if need be.   Patient did not want me to suture her eyebrow laceration. I discussed with patient risk versus benefits.  Wound is not actively bleeding.  Does not look actively infected.  I thoroughly cleaned area.  See procedure note. I placed Steri-Strips as well as gauze over this area. Discussed with patient cosmetic as well as possible infection risk of not closing wound. She voices understanding and continues to decline suturing at this time.  Patient continues to be tachycardic and tachypneic. She denies chest pain, shortness of breath or cough.  I have low suspicion for upper respiratory infection given her lack of symptoms.  I have low suspicion for PE, dissection or ACS as cause of her tachycardia and tachypnea. Likely her COPD which is not currently being treated at home. Low suspicion for sepsis or bacteremia. Unfortunately patient is refusing further intervention as far as her breathing is concerned.  She did have 2 puffs of her albuterol as well as steroids. Refusing magnesium or additional albuterol treatments at this time. States "I am fine and this is how I always breath." I discussed  risk versus benefit.  Patient voiced understanding of risk versus benefit and continues to decline treatment for her COPD.  0020: Consulted with Dr. Hal Hope who will evaluate patient for admission. Patient might benefit from Palliative consults given injuries and DNR/DNI status.  Patient has been seen evaluated by my attending, Dr. Ashok Cordia who agrees with above treatment, plan and disposition.     Final Clinical Impressions(s) / ED Diagnoses   Final diagnoses:  Subdural hematoma ([place])  COPD exacerbation ([facility])  Closed displaced fracture of greater tuberosity of right humerus, initial encounter  Closed nondisplaced fracture of styloid process of right ulna, initial encounter  Other closed intra-articular fracture of distal end of right radius, initial encounter  Iron deficiency anemia, unspecified iron deficiency anemia type  Platelets decreased [facility])    ED Discharge Orders    None       Kyley Solow A, PA-C 09/27/18 0038    Lajean Saver, MD 09/27/18 1537

## 2018-09-26 NOTE — ED Triage Notes (Signed)
[facility]- from home. Pt fell getting out of bed on the floor. No LOC. No blood thinners. Pt has right arm pain. Pt fell and hit her right head. No neck or back pain. Pt has swelling to her right eye and it is bruised.     130 HR 130/80 BP 97% 3L at baseline 97.1 temp 16 RR

## 2018-09-27 ENCOUNTER — Encounter (HOSPITAL_COMMUNITY): Payer: Self-pay | Admitting: Internal Medicine

## 2018-09-27 DIAGNOSIS — Z23 Encounter for immunization: Secondary | ICD-10-CM | POA: Diagnosis present

## 2018-09-27 DIAGNOSIS — S52614A Nondisplaced fracture of right ulna styloid process, initial encounter for closed fracture: Secondary | ICD-10-CM | POA: Insufficient documentation

## 2018-09-27 DIAGNOSIS — D509 Iron deficiency anemia, unspecified: Secondary | ICD-10-CM | POA: Diagnosis present

## 2018-09-27 DIAGNOSIS — S0181XA Laceration without foreign body of other part of head, initial encounter: Secondary | ICD-10-CM | POA: Diagnosis present

## 2018-09-27 DIAGNOSIS — J441 Chronic obstructive pulmonary disease with (acute) exacerbation: Secondary | ICD-10-CM

## 2018-09-27 DIAGNOSIS — D62 Acute posthemorrhagic anemia: Secondary | ICD-10-CM | POA: Diagnosis not present

## 2018-09-27 DIAGNOSIS — E559 Vitamin D deficiency, unspecified: Secondary | ICD-10-CM | POA: Diagnosis present

## 2018-09-27 DIAGNOSIS — Z9842 Cataract extraction status, left eye: Secondary | ICD-10-CM | POA: Diagnosis not present

## 2018-09-27 DIAGNOSIS — Z79899 Other long term (current) drug therapy: Secondary | ICD-10-CM | POA: Diagnosis not present

## 2018-09-27 DIAGNOSIS — S42411A Displaced simple supracondylar fracture without intercondylar fracture of right humerus, initial encounter for closed fracture: Secondary | ICD-10-CM | POA: Diagnosis present

## 2018-09-27 DIAGNOSIS — S065X9A Traumatic subdural hemorrhage with loss of consciousness of unspecified duration, initial encounter: Secondary | ICD-10-CM | POA: Diagnosis not present

## 2018-09-27 DIAGNOSIS — S42251A Displaced fracture of greater tuberosity of right humerus, initial encounter for closed fracture: Secondary | ICD-10-CM | POA: Diagnosis present

## 2018-09-27 DIAGNOSIS — W06XXXA Fall from bed, initial encounter: Secondary | ICD-10-CM | POA: Diagnosis present

## 2018-09-27 DIAGNOSIS — S065XAA Traumatic subdural hemorrhage with loss of consciousness status unknown, initial encounter: Secondary | ICD-10-CM | POA: Diagnosis present

## 2018-09-27 DIAGNOSIS — S0011XA Contusion of right eyelid and periocular area, initial encounter: Secondary | ICD-10-CM | POA: Diagnosis present

## 2018-09-27 DIAGNOSIS — Y92003 Bedroom of unspecified non-institutional (private) residence as the place of occurrence of the external cause: Secondary | ICD-10-CM | POA: Diagnosis not present

## 2018-09-27 DIAGNOSIS — D6959 Other secondary thrombocytopenia: Secondary | ICD-10-CM | POA: Diagnosis present

## 2018-09-27 DIAGNOSIS — Z9841 Cataract extraction status, right eye: Secondary | ICD-10-CM | POA: Diagnosis not present

## 2018-09-27 DIAGNOSIS — D696 Thrombocytopenia, unspecified: Secondary | ICD-10-CM | POA: Diagnosis present

## 2018-09-27 DIAGNOSIS — S52571A Other intraarticular fracture of lower end of right radius, initial encounter for closed fracture: Secondary | ICD-10-CM | POA: Diagnosis present

## 2018-09-27 DIAGNOSIS — I10 Essential (primary) hypertension: Secondary | ICD-10-CM | POA: Diagnosis present

## 2018-09-27 DIAGNOSIS — E785 Hyperlipidemia, unspecified: Secondary | ICD-10-CM | POA: Diagnosis present

## 2018-09-27 DIAGNOSIS — F251 Schizoaffective disorder, depressive type: Secondary | ICD-10-CM | POA: Diagnosis present

## 2018-09-27 DIAGNOSIS — J9610 Chronic respiratory failure, unspecified whether with hypoxia or hypercapnia: Secondary | ICD-10-CM | POA: Diagnosis present

## 2018-09-27 DIAGNOSIS — Z66 Do not resuscitate: Secondary | ICD-10-CM | POA: Diagnosis present

## 2018-09-27 DIAGNOSIS — S065X0A Traumatic subdural hemorrhage without loss of consciousness, initial encounter: Secondary | ICD-10-CM | POA: Diagnosis present

## 2018-09-27 DIAGNOSIS — C342 Malignant neoplasm of middle lobe, bronchus or lung: Secondary | ICD-10-CM | POA: Diagnosis present

## 2018-09-27 DIAGNOSIS — Z7982 Long term (current) use of aspirin: Secondary | ICD-10-CM | POA: Diagnosis not present

## 2018-09-27 DIAGNOSIS — K5790 Diverticulosis of intestine, part unspecified, without perforation or abscess without bleeding: Secondary | ICD-10-CM | POA: Diagnosis present

## 2018-09-27 LAB — CBC WITH DIFFERENTIAL/PLATELET
Abs Immature Granulocytes: 0.49 10*3/uL — ABNORMAL HIGH (ref 0.00–0.07)
Basophils Absolute: 0 10*3/uL (ref 0.0–0.1)
Basophils Relative: 0 %
Eosinophils Absolute: 0 10*3/uL (ref 0.0–0.5)
Eosinophils Relative: 0 %
HCT: 25.5 % — ABNORMAL LOW (ref 36.0–46.0)
Hemoglobin: 8 g/dL — ABNORMAL LOW (ref 12.0–15.0)
Immature Granulocytes: 5 %
Lymphocytes Relative: 5 %
Lymphs Abs: 0.6 10*3/uL — ABNORMAL LOW (ref 0.7–4.0)
MCH: 27.7 pg (ref 26.0–34.0)
MCHC: 31.4 g/dL (ref 30.0–36.0)
MCV: 88.2 fL (ref 80.0–100.0)
Monocytes Absolute: 0.8 10*3/uL (ref 0.1–1.0)
Monocytes Relative: 7 %
Neutro Abs: 9.1 10*3/uL — ABNORMAL HIGH (ref 1.7–7.7)
Neutrophils Relative %: 83 %
Platelets: 63 10*3/uL — ABNORMAL LOW (ref 150–400)
RBC: 2.89 MIL/uL — ABNORMAL LOW (ref 3.87–5.11)
RDW: 15.2 % (ref 11.5–15.5)
WBC: 11 10*3/uL — ABNORMAL HIGH (ref 4.0–10.5)
nRBC: 0 % (ref 0.0–0.2)

## 2018-09-27 LAB — COMPREHENSIVE METABOLIC PANEL
ALT: 20 U/L (ref 0–44)
AST: 24 U/L (ref 15–41)
Albumin: 3.5 g/dL (ref 3.5–5.0)
Alkaline Phosphatase: 59 U/L (ref 38–126)
Anion gap: 8 (ref 5–15)
BUN: 9 mg/dL (ref 8–23)
CO2: 29 mmol/L (ref 22–32)
Calcium: 8.9 mg/dL (ref 8.9–10.3)
Chloride: 106 mmol/L (ref 98–111)
Creatinine, Ser: 0.57 mg/dL (ref 0.44–1.00)
GFR calc Af Amer: >60 mL/min (ref >60)
GFR calc non Af Amer: >60 mL/min (ref >60)
Glucose, Bld: 156 mg/dL — ABNORMAL HIGH (ref 70–99)
Potassium: 3.9 mmol/L (ref 3.5–5.1)
Sodium: 143 mmol/L (ref 135–145)
Total Bilirubin: 0.6 mg/dL (ref 0.3–1.2)
Total Protein: 5.7 g/dL — ABNORMAL LOW (ref 6.5–8.1)

## 2018-09-27 LAB — RETICULOCYTES
Immature Retic Fract: 18.6 % — ABNORMAL HIGH (ref 2.3–15.9)
RBC.: 2.91 MIL/uL — ABNORMAL LOW (ref 3.87–5.11)
Retic Count, Absolute: 41 10*3/uL (ref 19.0–186.0)
Retic Ct Pct: 1.4 % (ref 0.4–3.1)

## 2018-09-27 LAB — HEPATIC FUNCTION PANEL
ALT: 22 U/L (ref 0–44)
AST: 37 U/L (ref 15–41)
Albumin: 3.5 g/dL (ref 3.5–5.0)
Alkaline Phosphatase: 65 U/L (ref 38–126)
Bilirubin, Direct: 0.1 mg/dL (ref 0.0–0.2)
Indirect Bilirubin: 0.7 mg/dL (ref 0.3–0.9)
Total Bilirubin: 0.8 mg/dL (ref 0.3–1.2)
Total Protein: 5.9 g/dL — ABNORMAL LOW (ref 6.5–8.1)

## 2018-09-27 LAB — BASIC METABOLIC PANEL
Anion gap: 8 (ref 5–15)
BUN: 12 mg/dL (ref 8–23)
CO2: 30 mmol/L (ref 22–32)
Calcium: 8.7 mg/dL — ABNORMAL LOW (ref 8.9–10.3)
Chloride: 105 mmol/L (ref 98–111)
Creatinine, Ser: 0.7 mg/dL (ref 0.44–1.00)
GFR calc Af Amer: >60 mL/min (ref >60)
GFR calc non Af Amer: >60 mL/min (ref >60)
Glucose, Bld: 199 mg/dL — ABNORMAL HIGH (ref 70–99)
Potassium: 3.7 mmol/L (ref 3.5–5.1)
Sodium: 143 mmol/L (ref 135–145)

## 2018-09-27 LAB — IRON AND TIBC
Iron: 31 ug/dL (ref 28–170)
Saturation Ratios: 8 % — ABNORMAL LOW (ref 10.4–31.8)
TIBC: 371 ug/dL (ref 250–450)
UIBC: 340 ug/dL

## 2018-09-27 LAB — ABO/RH: ABO/RH(D): O POS

## 2018-09-27 LAB — FERRITIN: Ferritin: 12 ng/mL (ref 11–307)

## 2018-09-27 LAB — FOLATE: Folate: 18.9 ng/mL (ref >5.9)

## 2018-09-27 LAB — PROTIME-INR
INR: 1.1 (ref 0.8–1.2)
Prothrombin Time: 14 seconds (ref 11.4–15.2)

## 2018-09-27 LAB — VITAMIN B12: Vitamin B-12: 330 pg/mL (ref 180–914)

## 2018-09-27 LAB — APTT: aPTT: 32 seconds (ref 24–36)

## 2018-09-27 MED ORDER — ACETAMINOPHEN 325 MG PO TABS
650.0000 mg | ORAL_TABLET | Freq: Four times a day (QID) | ORAL | Status: DC | PRN
  Filled 2018-09-27: qty 2

## 2018-09-27 MED ORDER — CHOLESTYRAMINE 4 G PO PACK
4.0000 g | PACK | Freq: Two times a day (BID) | ORAL | Status: DC
  Administered 2018-09-27 – 2018-10-04 (×12): 4 g via ORAL
  Filled 2018-09-27 (×16): qty 1

## 2018-09-27 MED ORDER — SODIUM CHLORIDE 0.9 % IV SOLN
INTRAVENOUS | Status: AC
  Administered 2018-09-27 (×2): via INTRAVENOUS

## 2018-09-27 MED ORDER — ANASTROZOLE 1 MG PO TABS
1.0000 mg | ORAL_TABLET | Freq: Every day | ORAL | Status: DC
  Administered 2018-09-27 – 2018-10-04 (×7): 1 mg via ORAL
  Filled 2018-09-27 (×8): qty 1

## 2018-09-27 MED ORDER — ONDANSETRON HCL 4 MG/2ML IJ SOLN: 4.0000 mg | Freq: Four times a day (QID) | INTRAMUSCULAR | Status: DC | PRN

## 2018-09-27 MED ORDER — OMEGA-3-ACID ETHYL ESTERS 1 G PO CAPS
1.0000 g | ORAL_CAPSULE | Freq: Every day | ORAL | Status: DC
  Administered 2018-09-27 – 2018-09-29 (×3): 1 g via ORAL
  Filled 2018-09-27 (×3): qty 1

## 2018-09-27 MED ORDER — OLANZAPINE 5 MG PO TABS
7.5000 mg | ORAL_TABLET | Freq: Every day | ORAL | Status: DC
  Administered 2018-09-27 – 2018-09-29 (×3): 7.5 mg via ORAL
  Filled 2018-09-27 (×3): qty 2

## 2018-09-27 MED ORDER — TETANUS-DIPHTH-ACELL PERTUSSIS 5-2.5-18.5 LF-MCG/0.5 IM SUSP
0.5000 mL | Freq: Once | INTRAMUSCULAR | Status: AC
  Administered 2018-09-27: 01:00:00 0.5 mL via INTRAMUSCULAR
  Filled 2018-09-27: qty 0.5

## 2018-09-27 MED ORDER — ACETAMINOPHEN 650 MG RE SUPP: 650.0000 mg | Freq: Four times a day (QID) | RECTAL | Status: DC | PRN

## 2018-09-27 MED ORDER — ONDANSETRON HCL 4 MG PO TABS: 4.0000 mg | ORAL_TABLET | Freq: Four times a day (QID) | ORAL | Status: DC | PRN

## 2018-09-27 MED ORDER — UMECLIDINIUM-VILANTEROL 62.5-25 MCG/INH IN AEPB
1.0000 | INHALATION_SPRAY | Freq: Every day | RESPIRATORY_TRACT | Status: DC
  Filled 2018-09-27: qty 14

## 2018-09-27 MED ORDER — MORPHINE SULFATE (PF) 2 MG/ML IV SOLN
1.0000 mg | INTRAVENOUS | Status: DC | PRN
  Administered 2018-09-27 – 2018-09-29 (×4): 1 mg via INTRAVENOUS
  Filled 2018-09-27 (×5): qty 1

## 2018-09-27 MED ORDER — SODIUM CHLORIDE 0.9 % IV BOLUS
500.0000 mL | Freq: Once | INTRAVENOUS | Status: AC
  Administered 2018-09-27: 500 mL via INTRAVENOUS

## 2018-09-27 MED ORDER — MORPHINE SULFATE (PF) 4 MG/ML IV SOLN
4.0000 mg | Freq: Once | INTRAVENOUS | Status: AC
  Administered 2018-09-27: 01:00:00 4 mg via INTRAVENOUS
  Filled 2018-09-27: qty 1

## 2018-09-27 MED ORDER — MAGNESIUM OXIDE 400 (241.3 MG) MG PO TABS
400.0000 mg | ORAL_TABLET | Freq: Every day | ORAL | Status: DC
  Administered 2018-09-27 – 2018-10-04 (×7): 400 mg via ORAL
  Filled 2018-09-27 (×7): qty 1

## 2018-09-27 MED ORDER — ATORVASTATIN CALCIUM 40 MG PO TABS
40.0000 mg | ORAL_TABLET | Freq: Every day | ORAL | Status: DC
  Administered 2018-09-27 – 2018-10-04 (×7): 40 mg via ORAL
  Filled 2018-09-27 (×7): qty 1

## 2018-09-27 MED ORDER — CITALOPRAM HYDROBROMIDE 20 MG PO TABS
20.0000 mg | ORAL_TABLET | Freq: Every day | ORAL | Status: DC
  Administered 2018-09-27 – 2018-10-04 (×7): 20 mg via ORAL
  Filled 2018-09-27 (×7): qty 1

## 2018-09-27 NOTE — Progress Notes (Addendum)
PROGRESS NOTE    Summer Williams  WUJ:[id number] DOB: December 04, 1945 DOA: 09/26/2018 PCP: Elby Showers, MD   Brief Narrative:  Summer Williams is Summer Williams 73 y.o. female with history of COPD, hyperlipidemia, breast cancer, lung cancer, thrombocytopenia had Summer Williams fall at home after she tripped.  She hit her head but did not lose consciousness.  Denies any chest pain or shortness of breath.  She also hurt her right upper extremity.  Patient called her son and EMS was called and was brought to the ER.  ED Course: In the ER patient had CT head CT maxillofacial and CT cervical spine.  Showed bilateral subdural hematoma with mild shift.  Also showed right periorbital hematoma.  There was mild laceration on the right pupil area for which patient declined sutures. labs also showed anemia with severe thrombocytopenia.  EKG showed sinus tachycardia.  X-rays revealed right distal humerus and distal radius and ulnar styloid fracture.  On-call neurosurgeon Dr. Venetia Constable was consulted at this time requested admission for further observation and also to transfuse platelets because of the low platelets and bleed.  Patient only takes aspirin advised not on any other anticoagulation.  For the fractures of the right upper extremity Dr. Percell Miller was consulted.  Patient was wheezing but patient declined any treatment for her COPD.  Chest x-ray does show some masslike opacity which will need further study to make sure there is no recurrence of her lung cancer.   Assessment & Plan:   Principal Problem:   Subdural hematoma (HCC) Active Problems:   Schizoaffective disorder (HCC)   COPD exacerbation (HCC)   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 ([place])   DNR (do not resuscitate)   1. Bilateral subdural hematoma with right periorbital hematoma - 1. Neurosurgery c/s - s/p 1 unit platelets given thrombocytopenia.  No plans for surgery at this time.  Recommend holding ASA x 1 week.   2. Right upper extremity fractures   Comminuted Fracture Involving Greater Tuberosity of Humerous with suspected transverse fracture lucency at the R humeral neck  Distal Humerus Fracture  distal radius and ulnar styloid fracture  1. RCRI 0  1. Discussed with son, concerned regarding COPD, may be worth discussing with pulm prior to surgery 2. Right wrist fracture, non operative management 3. Right proximal humerus fracture planning for non op management 4. Right distal humerus fracture, planning for ORIF  3. Severe thrombocytopenia with history of chronic thrombocytopenia 1. S/p 1 unit platelets  4. Normocytic normochromic anemia with worsening of hemoglobin probably from acute blood loss.  Follow CBC transfuse if hemoglobin less than 7.  5. COPD with wheezing -declines any inhalers or nebulizer.  6. History of breast cancer on anastrozole.  7. Previous history of lung cancer - X ray with mass like opacity in R hilar region (possibly post treatment, but recurrent mass is possible) - needs further work up  8. Schizoaffective disorder on Zyprexa and citalopram.  DVT prophylaxis: SCD Code Status: dnr Family Communication: none at bedside Disposition Plan: pending   Consultants:   Ortho  neurosurgery  Procedures:   none  Antimicrobials:  Anti-infectives (From admission, onward)   None           Subjective: [place] getting out of bed. Having pain, issues with R arm.  Objective: Vitals:   09/27/18 0540 09/27/18 0744 09/27/18 1117 09/27/18 1642  BP: 99/60 111/61 108/71 100/63  Pulse: (!) 106 99 (!) 108 98  Resp: (!) 27 20 20  (!) 22  Temp: 99.5 F (37.5 C) 99.2 F (37.3 C) 98.6 F (37 C) 99 F (37.2 C)  TempSrc: Oral  Oral Oral  SpO2: 99% 98%  98%  Weight:      Height:        Intake/Output Summary (Last 24 hours) at 09/27/2018 1839 Last data filed at 09/27/2018 1500 Gross per 24 hour  Intake 2110.67 ml  Output 0 ml  Net 2110.67 ml   Filed Weights   09/26/18 2012  Weight: 76.2 kg     Examination:  General exam: Appears calm and comfortable  Respiratory system: Clear to auscultation. Respiratory effort normal. Cardiovascular system: S1 & S2 heard, RRR.  Gastrointestinal system: Abdomen is nondistended, soft and nontender. Central nervous system: Alert and oriented. No focal neurological deficits. Extremities: R arm limited due to pain Skin: bruising to R eye Psychiatry: Judgement and insight appear normal. Mood & affect appropriate.     Data Reviewed: I have personally reviewed following labs and imaging studies  CBC: Recent Labs  Lab 09/26/18 2049 09/27/18 0736  WBC 9.8 11.0*  NEUTROABS 5.9 9.1*  HGB 9.7* 8.0*  HCT 31.9* 25.5*  MCV 90.6 88.2  PLT 38* 63*   Basic Metabolic Panel: Recent Labs  Lab 09/26/18 2049 09/27/18 0736  NA 140 143  K 4.0 3.7  CL 102 105  CO2 32 30  GLUCOSE 116* 199*  BUN 14 12  CREATININE 0.66 0.70  CALCIUM 9.0 8.7*   GFR: Estimated Creatinine Clearance: 61.7 mL/min (by C-G formula based on SCr of 0.7 mg/dL). Liver Function Tests: Recent Labs  Lab 09/27/18 0736  AST 37  ALT 22  ALKPHOS 65  BILITOT 0.8  PROT 5.9*  ALBUMIN 3.5   No results for input(s): LIPASE, AMYLASE in the last 168 hours. No results for input(s): AMMONIA in the last 168 hours. Coagulation Profile: Recent Labs  Lab 09/26/18 2347  INR 1.1   Cardiac Enzymes: No results for input(s): CKTOTAL, CKMB, CKMBINDEX, TROPONINI in the last 168 hours. BNP (last 3 results) No results for input(s): PROBNP in the last 8760 hours. HbA1C: No results for input(s): HGBA1C in the last 72 hours. CBG: No results for input(s): GLUCAP in the last 168 hours. Lipid Profile: No results for input(s): CHOL, HDL, LDLCALC, TRIG, CHOLHDL, LDLDIRECT in the last 72 hours. Thyroid Function Tests: No results for input(s): TSH, T4TOTAL, FREET4, T3FREE, THYROIDAB in the last 72 hours. Anemia Panel: Recent Labs    09/27/18 0736  VITAMINB12 330  FOLATE 18.9  FERRITIN  12  TIBC 371  IRON 31  RETICCTPCT 1.4   Sepsis Labs: No results for input(s): PROCALCITON, LATICACIDVEN in the last 168 hours.  No results found for this or any previous visit (from the past 240 hour(s)).       Radiology Studies: Dg Chest 2 View  Result Date: 09/26/2018 CLINICAL DATA:  Shortness of breath EXAM: CHEST - 2 VIEW COMPARISON:  09/03/2017, PET CT 11/24/2016 FINDINGS: Masslike opacity and architectural distortion in the right hilar region, slightly more opaque. Volume loss in the right hemithorax consistent with post treatment change. No pleural effusion. Stable cardiomediastinal silhouette with aortic atherosclerosis. No pneumothorax. Subtle fracture proximal right humerus. Age indeterminate right seventh rib fracture. IMPRESSION: 1. Negative for pneumothorax. 2. Masslike opacity with architectural distortion in the right hilar region, possibly due to post treatment change however this region appears slightly more opaque compared to prior and recurrent mass is possible. 3. Subtle fracture involving the proximal right humerus. 4. Age indeterminate  right seventh rib fracture Electronically Signed   By: Donavan Foil M.D.   On: 09/26/2018 21:54   Dg Pelvis 1-2 Views  Result Date: 09/26/2018 CLINICAL DATA:  Fall EXAM: PELVIS - 1-2 VIEW COMPARISON:  None. FINDINGS: SI joints are intact. Pubic symphysis and rami show no fracture. No fracture or dislocation is evident. IMPRESSION: No acute osseous abnormality Electronically Signed   By: Donavan Foil M.D.   On: 09/26/2018 21:44   Dg Shoulder Right  Result Date: 09/26/2018 CLINICAL DATA:  Fall EXAM: RIGHT SHOULDER - 2+ VIEW COMPARISON:  None. FINDINGS: Acute mildly comminuted fracture at the greater tuberosity of the humerus. Suspected faint transverse lucency through the neck of the humerus. No dislocation. Mild AC joint degenerative change. Architectural distortion at the right hilus IMPRESSION: 1. Acute comminuted fracture involving  the greater tuberosity of the humerus with suspected transverse fracture lucency at the right humeral neck. Electronically Signed   By: Donavan Foil M.D.   On: 09/26/2018 21:43   Dg Elbow Complete Right  Result Date: 09/26/2018 CLINICAL DATA:  Fall EXAM: RIGHT ELBOW - COMPLETE 3+ VIEW COMPARISON:  None. FINDINGS: Acute, mildly comminuted fracture involving the distal humerus above the level of the condyles. About 1/3 bone with ulnar displacement of distal fracture fragment. Associated large elbow effusion. Radial head alignment is within normal limits. IMPRESSION: Acute, mildly comminuted and displaced distal humerus fracture with associated elbow effusion Electronically Signed   By: Donavan Foil M.D.   On: 09/26/2018 21:48   Dg Forearm Right  Result Date: 09/26/2018 CLINICAL DATA:  Fall EXAM: RIGHT FOREARM - 2 VIEW COMPARISON:  None. FINDINGS: Acute minimally displaced fracture at the ulnar styloid process. Acute impacted intra-articular distal radius fracture with mild dorsal displacement. Incompletely visualized distal humerus fracture. IMPRESSION: 1. Acute impacted intra-articular distal radius fracture. Acute ulnar styloid process fracture 2. Incompletely visualized displaced distal humerus fracture Electronically Signed   By: Donavan Foil M.D.   On: 09/26/2018 21:47   Ct Head Wo Contrast  Result Date: 09/26/2018 CLINICAL DATA:  Initial evaluation for acute trauma, fall. EXAM: CT HEAD WITHOUT CONTRAST CT MAXILLOFACIAL WITHOUT CONTRAST CT CERVICAL SPINE WITHOUT CONTRAST TECHNIQUE: Multidetector CT imaging of the head, cervical spine, and maxillofacial structures were performed using the standard protocol without intravenous contrast. Multiplanar CT image reconstructions of the cervical spine and maxillofacial structures were also generated. COMPARISON:  None. FINDINGS: CT HEAD FINDINGS Brain: Generalized age-related cerebral atrophy with mild chronic small vessel ischemic disease. Acute subdural  hematoma overlies the right cerebral convexity, measuring up to 6 mm in maximal thickness. Additional smaller subdural hemorrhage seen overlying the left parietal convexity, measuring up to 5 mm in maximal diameter. Associated trace 1-2 mm right-to-left shift. No hydrocephalus or ventricular trapping. No other acute intracranial hemorrhage. No acute large vessel territory infarct. No mass lesion. Vascular: No hyperdense vessel. Scattered vascular calcifications noted within the carotid siphons. Skull: Soft tissue contusion at the right periorbital region. Calvarium intact. Other: Trace left mastoid effusion noted, of doubtful significance. CT MAXILLOFACIAL FINDINGS Osseous: Scotty Pinder zygomatic arches are intact. No acute maxillary fracture. Pterygoid plates intact. No acute nasal bone fracture. Mild left-to-right nasal septal deviation without associated fracture. Mandible intact. Mandibular condyles normally situated. No acute abnormality about the dentition. Orbits: Globes intact. Normal retro-orbital hematoma or other pathology. Bony orbits intact without fracture. Sinuses: Paranasal sinuses are clear.  No hemosinus. Soft tissues: Large right periorbital/facial contusion. CT CERVICAL SPINE FINDINGS Alignment: Straightening of the normal cervical lordosis. No listhesis.  Skull base and vertebrae: Skull base intact. Normal C1-2 articulations are preserved in the dens is intact. Vertebral body heights maintained. No acute fracture. Soft tissues and spinal canal: Soft tissues of the neck demonstrate no acute finding. No abnormal prevertebral edema. Spinal canal within normal limits. Disc levels: Mild right-sided facet arthrosis at C2-3 and C3-4. No significant spinal stenosis within the cervical spine. Upper chest: Visualized upper chest demonstrates no acute finding. Upper lobe predominant centrilobular emphysema noted. Other: None. IMPRESSION: CT HEAD: 1. Acute bilateral subdural hematomas, right larger than left as above.  Associated trace 1-2 mm right-to-left midline shift. 2. No other acute intracranial abnormality. 3. Mild age-related cerebral atrophy with chronic small vessel ischemic disease. CT MAXILLOFACIAL: 1. Large right periorbital/facial soft tissue contusion. 2. No other acute maxillofacial injury. No fracture. Intact globes with no retro-orbital pathology. CT CERVICAL SPINE: No acute traumatic injury within the cervical spine. Critical Value/emergent results were called by telephone at the time of interpretation on 09/26/2018 at 10:50 pm to Dr. Shelby Dubin , who verbally acknowledged these results. Electronically Signed   By: Jeannine Boga M.D.   On: 09/26/2018 22:52   Ct Cervical Spine Wo Contrast  Result Date: 09/26/2018 CLINICAL DATA:  Initial evaluation for acute trauma, fall. EXAM: CT HEAD WITHOUT CONTRAST CT MAXILLOFACIAL WITHOUT CONTRAST CT CERVICAL SPINE WITHOUT CONTRAST TECHNIQUE: Multidetector CT imaging of the head, cervical spine, and maxillofacial structures were performed using the standard protocol without intravenous contrast. Multiplanar CT image reconstructions of the cervical spine and maxillofacial structures were also generated. COMPARISON:  None. FINDINGS: CT HEAD FINDINGS Brain: Generalized age-related cerebral atrophy with mild chronic small vessel ischemic disease. Acute subdural hematoma overlies the right cerebral convexity, measuring up to 6 mm in maximal thickness. Additional smaller subdural hemorrhage seen overlying the left parietal convexity, measuring up to 5 mm in maximal diameter. Associated trace 1-2 mm right-to-left shift. No hydrocephalus or ventricular trapping. No other acute intracranial hemorrhage. No acute large vessel territory infarct. No mass lesion. Vascular: No hyperdense vessel. Scattered vascular calcifications noted within the carotid siphons. Skull: Soft tissue contusion at the right periorbital region. Calvarium intact. Other: Trace left mastoid effusion  noted, of doubtful significance. CT MAXILLOFACIAL FINDINGS Osseous: Kaitlyne Friedhoff zygomatic arches are intact. No acute maxillary fracture. Pterygoid plates intact. No acute nasal bone fracture. Mild left-to-right nasal septal deviation without associated fracture. Mandible intact. Mandibular condyles normally situated. No acute abnormality about the dentition. Orbits: Globes intact. Normal retro-orbital hematoma or other pathology. Bony orbits intact without fracture. Sinuses: Paranasal sinuses are clear.  No hemosinus. Soft tissues: Large right periorbital/facial contusion. CT CERVICAL SPINE FINDINGS Alignment: Straightening of the normal cervical lordosis. No listhesis. Skull base and vertebrae: Skull base intact. Normal C1-2 articulations are preserved in the dens is intact. Vertebral body heights maintained. No acute fracture. Soft tissues and spinal canal: Soft tissues of the neck demonstrate no acute finding. No abnormal prevertebral edema. Spinal canal within normal limits. Disc levels: Mild right-sided facet arthrosis at C2-3 and C3-4. No significant spinal stenosis within the cervical spine. Upper chest: Visualized upper chest demonstrates no acute finding. Upper lobe predominant centrilobular emphysema noted. Other: None. IMPRESSION: CT HEAD: 1. Acute bilateral subdural hematomas, right larger than left as above. Associated trace 1-2 mm right-to-left midline shift. 2. No other acute intracranial abnormality. 3. Mild age-related cerebral atrophy with chronic small vessel ischemic disease. CT MAXILLOFACIAL: 1. Large right periorbital/facial soft tissue contusion. 2. No other acute maxillofacial injury. No fracture. Intact globes with no retro-orbital  pathology. CT CERVICAL SPINE: No acute traumatic injury within the cervical spine. Critical Value/emergent results were called by telephone at the time of interpretation on 09/26/2018 at 10:50 pm to Dr. Shelby Dubin , who verbally acknowledged these results.  Electronically Signed   By: Jeannine Boga M.D.   On: 09/26/2018 22:52   Dg Hand Complete Right  Result Date: 09/26/2018 CLINICAL DATA:  Fall EXAM: RIGHT HAND - COMPLETE 3+ VIEW COMPARISON:  None. FINDINGS: Bones appear osteopenic. Mild arthritis at the first [facility] joint. Degenerative changes at the DIP and PIP joints. Acute nondisplaced fracture ulnar styloid process. Acute, mildly comminuted and impacted intra-articular fracture at the distal radius. IMPRESSION: 1. Acute comminuted impacted intra-articular fracture of the distal radius 2. Acute nondisplaced ulnar styloid process fracture Electronically Signed   By: Donavan Foil M.D.   On: 09/26/2018 21:46   Ct Maxillofacial Wo Contrast  Result Date: 09/26/2018 CLINICAL DATA:  Initial evaluation for acute trauma, fall. EXAM: CT HEAD WITHOUT CONTRAST CT MAXILLOFACIAL WITHOUT CONTRAST CT CERVICAL SPINE WITHOUT CONTRAST TECHNIQUE: Multidetector CT imaging of the head, cervical spine, and maxillofacial structures were performed using the standard protocol without intravenous contrast. Multiplanar CT image reconstructions of the cervical spine and maxillofacial structures were also generated. COMPARISON:  None. FINDINGS: CT HEAD FINDINGS Brain: Generalized age-related cerebral atrophy with mild chronic small vessel ischemic disease. Acute subdural hematoma overlies the right cerebral convexity, measuring up to 6 mm in maximal thickness. Additional smaller subdural hemorrhage seen overlying the left parietal convexity, measuring up to 5 mm in maximal diameter. Associated trace 1-2 mm right-to-left shift. No hydrocephalus or ventricular trapping. No other acute intracranial hemorrhage. No acute large vessel territory infarct. No mass lesion. Vascular: No hyperdense vessel. Scattered vascular calcifications noted within the carotid siphons. Skull: Soft tissue contusion at the right periorbital region. Calvarium intact. Other: Trace left mastoid effusion noted,  of doubtful significance. CT MAXILLOFACIAL FINDINGS Osseous: Smokey Melott zygomatic arches are intact. No acute maxillary fracture. Pterygoid plates intact. No acute nasal bone fracture. Mild left-to-right nasal septal deviation without associated fracture. Mandible intact. Mandibular condyles normally situated. No acute abnormality about the dentition. Orbits: Globes intact. Normal retro-orbital hematoma or other pathology. Bony orbits intact without fracture. Sinuses: Paranasal sinuses are clear.  No hemosinus. Soft tissues: Large right periorbital/facial contusion. CT CERVICAL SPINE FINDINGS Alignment: Straightening of the normal cervical lordosis. No listhesis. Skull base and vertebrae: Skull base intact. Normal C1-2 articulations are preserved in the dens is intact. Vertebral body heights maintained. No acute fracture. Soft tissues and spinal canal: Soft tissues of the neck demonstrate no acute finding. No abnormal prevertebral edema. Spinal canal within normal limits. Disc levels: Mild right-sided facet arthrosis at C2-3 and C3-4. No significant spinal stenosis within the cervical spine. Upper chest: Visualized upper chest demonstrates no acute finding. Upper lobe predominant centrilobular emphysema noted. Other: None. IMPRESSION: CT HEAD: 1. Acute bilateral subdural hematomas, right larger than left as above. Associated trace 1-2 mm right-to-left midline shift. 2. No other acute intracranial abnormality. 3. Mild age-related cerebral atrophy with chronic small vessel ischemic disease. CT MAXILLOFACIAL: 1. Large right periorbital/facial soft tissue contusion. 2. No other acute maxillofacial injury. No fracture. Intact globes with no retro-orbital pathology. CT CERVICAL SPINE: No acute traumatic injury within the cervical spine. Critical Value/emergent results were called by telephone at the time of interpretation on 09/26/2018 at 10:50 pm to Dr. Shelby Dubin , who verbally acknowledged these results. Electronically  Signed   By: Pincus Badder.D.  On: 09/26/2018 22:52        Scheduled Meds: . anastrozole  1 mg Oral Daily  . atorvastatin  40 mg Oral Daily  . cholestyramine  4 g Oral BID  . citalopram  20 mg Oral Daily  . magnesium oxide  400 mg Oral Daily  . OLANZapine  7.5 mg Oral Daily  . omega-3 acid ethyl esters  1 g Oral Daily   Continuous Infusions: . sodium chloride 75 mL/hr at 09/27/18 0624     LOS: 0 days    Time spent: over 30 min    Fayrene Helper, MD [facility] Pager AMION  If 7PM-7AM, please contact night-coverage [email or web address] Password [facility] 09/27/2018, 6:39 PM

## 2018-09-27 NOTE — Consult Note (Signed)
Reason for Consult:RUE fxs Referring Physician: A Summer Williams is an 73 y.o. female.  HPI: Summer Williams was trying to get out of bed yesterday when her pant leg got caught on a piece of the frame. She tried to get it unstuck but rolled out of the bed. She put her arm out to break her fall. She had immediate pain in it. She came to the hospital where she was found to have multiple RUE fxs as well as a SDH. She was admitted and orthopedic surgery was consulted. She is RHD.  Past Medical History:  Diagnosis Date  . Breast cancer ([name]) 01/05/14   left breast  . COPD (chronic obstructive pulmonary disease) ([place])   . Depression   . Diverticulosis   . History of alcohol abuse    last 1980  . Hyperlipidemia   . Internal hemorrhoids   . Lung cancer ([place])   . Oxygen deficiency   . Personal history of radiation therapy 2015  . Radiation 03/23/14-04/19/14   Left breast  . Radiation 12/13/14-01/19/15   right upper/middle lobe of lung 70.2 Gy  . Requires supplemental oxygen    at night USES 2 LITERS   . Shortness of breath     WITH ACTIVITY  . Skin cancer    Squamous  . Vitamin D deficiency   . Wears glasses     Past Surgical History:  Procedure Laterality Date  . BREAST BIOPSY Left 01/15/2014   x2  . BREAST LUMPECTOMY Left 2015   radiation  . BREAST SURGERY  02/09/2014   Left luimpectomy  . CHOLECYSTECTOMY  2003  . COLONOSCOPY WITH PROPOFOL N/A 11/08/2014   Procedure: COLONOSCOPY WITH PROPOFOL;  Surgeon: Inda Castle, MD;  Location: [facility];  Service: Endoscopy;  Laterality: N/A;  . DENTAL SURGERY     implants  . EYE SURGERY Bilateral 2005   Cataract OD  . VIDEO BRONCHOSCOPY WITH ENDOBRONCHIAL NAVIGATION N/A 11/10/2014   Procedure: VIDEO BRONCHOSCOPY WITH ENDOBRONCHIAL NAVIGATION;  Surgeon: Melrose Nakayama, MD;  Location: [facility] OR;  Service: Thoracic;  Laterality: N/A;    Family History  Problem Relation Age of Onset  . Heart disease Father   . Lung cancer Maternal  Grandfather   . Leukemia Paternal Grandfather   . Cholelithiasis Mother   . Ovarian cancer Maternal Aunt   . Breast cancer Maternal Aunt   . Colon cancer Neg Hx   . Colon polyps Neg Hx   . Esophageal cancer Neg Hx   . Kidney disease Neg Hx   . Gallbladder disease Neg Hx   . Stomach cancer Neg Hx   . Rectal cancer Neg Hx     Social History:  reports that she quit smoking about 13 years ago. Her smoking use included cigarettes. She has a 50.00 pack-year smoking history. She has never used smokeless tobacco. She reports that she does not drink alcohol or use drugs.  Allergies: No Known Allergies  Medications: I have reviewed the patient's current medications.  Results for orders placed or performed during the hospital encounter of 09/26/18 (from the past 48 hour(s))  CBC with Differential     Status: Abnormal   Collection Time: 09/26/18  8:49 PM  Result Value Ref Range   WBC 9.8 4.0 - 10.5 K/uL   RBC 3.52 (L) 3.87 - 5.11 MIL/uL   Hemoglobin 9.7 (L) 12.0 - 15.0 g/dL   HCT 31.9 (L) 36.0 - 46.0 %   MCV 90.6 80.0 - 100.0 fL  MCH 27.6 26.0 - 34.0 pg   MCHC 30.4 30.0 - 36.0 g/dL   RDW 15.1 11.5 - 15.5 %   Platelets 38 (L) 150 - 400 K/uL    Comment: REPEATED TO VERIFY PLATELET COUNT CONFIRMED BY SMEAR SPECIMEN CHECKED FOR CLOTS Immature Platelet Fraction may be clinically indicated, consider ordering this additional test [id number]    nRBC 0.0 0.0 - 0.2 %   Neutrophils Relative % 61 %   Neutro Abs 5.9 1.7 - 7.7 K/uL   Lymphocytes Relative 7 %   Lymphs Abs 0.7 0.7 - 4.0 K/uL   Monocytes Relative 30 %   Monocytes Absolute 2.9 (H) 0.1 - 1.0 K/uL   Eosinophils Relative 0 %   Eosinophils Absolute 0.0 0.0 - 0.5 K/uL   Basophils Relative 0 %   Basophils Absolute 0.0 0.0 - 0.1 K/uL   Immature Granulocytes 2 %   Abs Immature Granulocytes 0.22 (H) 0.00 - 0.07 K/uL    Comment: Performed at [facility] [address] [place], [place] [address]  Basic metabolic panel      Status: Abnormal   Collection Time: 09/26/18  8:49 PM  Result Value Ref Range   Sodium 140 135 - 145 mmol/L   Potassium 4.0 3.5 - 5.1 mmol/L   Chloride 102 98 - 111 mmol/L   CO2 32 22 - 32 mmol/L   Glucose, Bld 116 (H) 70 - 99 mg/dL   BUN 14 8 - 23 mg/dL   Creatinine, Ser 0.66 0.44 - 1.00 mg/dL   Calcium 9.0 8.9 - 10.3 mg/dL   GFR calc non Af Amer >60 >60 mL/min   GFR calc Af Amer >60 >60 mL/min   Anion gap 6 5 - 15    Comment: Performed at [facility], [place] [address], [place], [place] [address]  Prepare Pheresed Platelets     Status: None (Preliminary result)   Collection Time: 09/26/18 11:27 PM  Result Value Ref Range   Unit Number [id number]    Blood Component Type PLTP LR1 PAS    Unit division 00    Status of Unit ISSUED    Transfusion Status      OK TO TRANSFUSE Performed at [facility] [address] [place], [place] [address]   Type and screen [place]     Status: None   Collection Time: 09/26/18 11:44 PM  Result Value Ref Range   ABO/RH(D) O POS    Antibody Screen NEG    Sample Expiration      09/29/2018 Performed at [facility], [place] [address], [place], [place] [address]   ABO/Rh     Status: None (Preliminary result)   Collection Time: 09/26/18 11:44 PM  Result Value Ref Range   ABO/RH(D)      O POS Performed at [facility] [address] [place], [place] [address]   Protime-INR     Status: None   Collection Time: 09/26/18 11:47 PM  Result Value Ref Range   Prothrombin Time 14.0 11.4 - 15.2 seconds   INR 1.1 0.8 - 1.2    Comment: (NOTE) INR goal varies based on device and disease states. Performed at [facility], [place] [address], [place], [place] [address]   PTT     Status: None   Collection Time: 09/26/18 11:47 PM  Result Value Ref Range   aPTT 32 24 - 36 seconds    Comment: Performed at [facility], [place] [address], [place], [place] [address]  CBC WITH DIFFERENTIAL     Status:  Abnormal   Collection Time: 09/27/18  7:36 AM  Result Value Ref Range   WBC 11.0 (H) 4.0 - 10.5 K/uL   RBC 2.89 (L) 3.87 - 5.11 MIL/uL   Hemoglobin 8.0 (L) 12.0 - 15.0 g/dL   HCT 25.5 (L) 36.0 - 46.0 %   MCV 88.2 80.0 - 100.0 fL   MCH 27.7 26.0 - 34.0 pg   MCHC 31.4 30.0 - 36.0 g/dL   RDW 15.2 11.5 - 15.5 %   Platelets 63 (L) 150 - 400 K/uL    Comment: REPEATED TO VERIFY Immature Platelet Fraction may be clinically indicated, consider ordering this additional test [id number] CONSISTENT WITH PREVIOUS RESULT    nRBC 0.0 0.0 - 0.2 %   Neutrophils Relative % 83 %   Neutro Abs 9.1 (H) 1.7 - 7.7 K/uL   Lymphocytes Relative 5 %   Lymphs Abs 0.6 (L) 0.7 - 4.0 K/uL   Monocytes Relative 7 %   Monocytes Absolute 0.8 0.1 - 1.0 K/uL   Eosinophils Relative 0 %   Eosinophils Absolute 0.0 0.0 - 0.5 K/uL   Basophils Relative 0 %   Basophils Absolute 0.0 0.0 - 0.1 K/uL   Immature Granulocytes 5 %   Abs Immature Granulocytes 0.49 (H) 0.00 - 0.07 K/uL    Comment: Performed at [facility] [address], [place], [place] [address]  Basic metabolic panel     Status: Abnormal   Collection Time: 09/27/18  7:36 AM  Result Value Ref Range   Sodium 143 135 - 145 mmol/L   Potassium 3.7 3.5 - 5.1 mmol/L   Chloride 105 98 - 111 mmol/L   CO2 30 22 - 32 mmol/L   Glucose, Bld 199 (H) 70 - 99 mg/dL   BUN 12 8 - 23 mg/dL   Creatinine, Ser 0.70 0.44 - 1.00 mg/dL   Calcium 8.7 (L) 8.9 - 10.3 mg/dL   GFR calc non Af Amer >60 >60 mL/min   GFR calc Af Amer >60 >60 mL/min   Anion gap 8 5 - 15    Comment: Performed at [facility], [place] [address] [place], [place] [address]  Vitamin B12     Status: None   Collection Time: 09/27/18  7:36 AM  Result Value Ref Range   Vitamin B-12 330 180 - 914 pg/mL    Comment: (NOTE) This assay is not validated for testing neonatal or myeloproliferative syndrome specimens for Vitamin B12 levels. Performed at [facility], [place] [address]  [place], [place] [address]   Folate     Status: None   Collection Time: 09/27/18  7:36 AM  Result Value Ref Range   Folate 18.9 >5.9 ng/mL    Comment: Performed at [facility] [address], [place], [place] [address]  Iron and TIBC     Status: Abnormal   Collection Time: 09/27/18  7:36 AM  Result Value Ref Range   Iron 31 28 - 170 ug/dL   TIBC 371 250 - 450 ug/dL   Saturation Ratios 8 (L) 10.4 - 31.8 %   UIBC 340 ug/dL    Comment: Performed at [facility], [place] [address], [place], [place] [address]  Ferritin     Status: None   Collection Time: 09/27/18  7:36 AM  Result Value Ref Range   Ferritin 12 11 -  307 ng/mL    Comment: Performed at [facility], [place] [address], [place], [place] [address]  Reticulocytes     Status: Abnormal   Collection Time: 09/27/18  7:36 AM  Result Value Ref Range   Retic Ct Pct 1.4 0.4 - 3.1 %   RBC. 2.91 (L) 3.87 - 5.11 MIL/uL   Retic Count, Absolute 41.0 19.0 - 186.0 K/uL   Immature Retic Fract 18.6 (H) 2.3 - 15.9 %    Comment: Performed at [facility] [address] [place], [place] [address]  Hepatic function panel     Status: Abnormal   Collection Time: 09/27/18  7:36 AM  Result Value Ref Range   Total Protein 5.9 (L) 6.5 - 8.1 g/dL   Albumin 3.5 3.5 - 5.0 g/dL   AST 37 15 - 41 U/L   ALT 22 0 - 44 U/L   Alkaline Phosphatase 65 38 - 126 U/L   Total Bilirubin 0.8 0.3 - 1.2 mg/dL   Bilirubin, Direct 0.1 0.0 - 0.2 mg/dL   Indirect Bilirubin 0.7 0.3 - 0.9 mg/dL    Comment: Performed at [facility] [address], [place], [place] [address]    Dg Chest 2 View  Result Date: 09/26/2018 CLINICAL DATA:  Shortness of breath EXAM: CHEST - 2 VIEW COMPARISON:  09/03/2017, PET CT 11/24/2016 FINDINGS: Masslike opacity and architectural distortion in the right hilar region, slightly more opaque. Volume loss in the right hemithorax consistent with post treatment change. No pleural effusion. Stable cardiomediastinal silhouette  with aortic atherosclerosis. No pneumothorax. Subtle fracture proximal right humerus. Age indeterminate right seventh rib fracture. IMPRESSION: 1. Negative for pneumothorax. 2. Masslike opacity with architectural distortion in the right hilar region, possibly due to post treatment change however this region appears slightly more opaque compared to prior and recurrent mass is possible. 3. Subtle fracture involving the proximal right humerus. 4. Age indeterminate right seventh rib fracture Electronically Signed   By: Donavan Foil M.D.   On: 09/26/2018 21:54   Dg Pelvis 1-2 Views  Result Date: 09/26/2018 CLINICAL DATA:  Fall EXAM: PELVIS - 1-2 VIEW COMPARISON:  None. FINDINGS: SI joints are intact. Pubic symphysis and rami show no fracture. No fracture or dislocation is evident. IMPRESSION: No acute osseous abnormality Electronically Signed   By: Donavan Foil M.D.   On: 09/26/2018 21:44   Dg Shoulder Right  Result Date: 09/26/2018 CLINICAL DATA:  Fall EXAM: RIGHT SHOULDER - 2+ VIEW COMPARISON:  None. FINDINGS: Acute mildly comminuted fracture at the greater tuberosity of the humerus. Suspected faint transverse lucency through the neck of the humerus. No dislocation. Mild AC joint degenerative change. Architectural distortion at the right hilus IMPRESSION: 1. Acute comminuted fracture involving the greater tuberosity of the humerus with suspected transverse fracture lucency at the right humeral neck. Electronically Signed   By: Donavan Foil M.D.   On: 09/26/2018 21:43   Dg Elbow Complete Right  Result Date: 09/26/2018 CLINICAL DATA:  Fall EXAM: RIGHT ELBOW - COMPLETE 3+ VIEW COMPARISON:  None. FINDINGS: Acute, mildly comminuted fracture involving the distal humerus above the level of the condyles. About 1/3 bone with ulnar displacement of distal fracture fragment. Associated large elbow effusion. Radial head alignment is within normal limits. IMPRESSION: Acute, mildly comminuted and displaced distal  humerus fracture with associated elbow effusion Electronically Signed   By: Donavan Foil M.D.   On: 09/26/2018 21:48   Dg Forearm Right  Result Date: 09/26/2018 CLINICAL DATA:  Fall EXAM: RIGHT FOREARM - 2 VIEW COMPARISON:  None. FINDINGS: Acute minimally displaced fracture at the ulnar styloid process. Acute impacted intra-articular distal radius fracture with mild dorsal displacement. Incompletely visualized distal humerus fracture. IMPRESSION: 1. Acute impacted intra-articular distal radius fracture. Acute ulnar styloid process fracture 2. Incompletely visualized displaced distal humerus fracture Electronically Signed   By: Donavan Foil M.D.   On: 09/26/2018 21:47   Ct Head Wo Contrast  Result Date: 09/26/2018 CLINICAL DATA:  Initial evaluation for acute trauma, fall. EXAM: CT HEAD WITHOUT CONTRAST CT MAXILLOFACIAL WITHOUT CONTRAST CT CERVICAL SPINE WITHOUT CONTRAST TECHNIQUE: Multidetector CT imaging of the head, cervical spine, and maxillofacial structures were performed using the standard protocol without intravenous contrast. Multiplanar CT image reconstructions of the cervical spine and maxillofacial structures were also generated. COMPARISON:  None. FINDINGS: CT HEAD FINDINGS Brain: Generalized age-related cerebral atrophy with mild chronic small vessel ischemic disease. Acute subdural hematoma overlies the right cerebral convexity, measuring up to 6 mm in maximal thickness. Additional smaller subdural hemorrhage seen overlying the left parietal convexity, measuring up to 5 mm in maximal diameter. Associated trace 1-2 mm right-to-left shift. No hydrocephalus or ventricular trapping. No other acute intracranial hemorrhage. No acute large vessel territory infarct. No mass lesion. Vascular: No hyperdense vessel. Scattered vascular calcifications noted within the carotid siphons. Skull: Soft tissue contusion at the right periorbital region. Calvarium intact. Other: Trace left mastoid effusion noted,  of doubtful significance. CT MAXILLOFACIAL FINDINGS Osseous: A zygomatic arches are intact. No acute maxillary fracture. Pterygoid plates intact. No acute nasal bone fracture. Mild left-to-right nasal septal deviation without associated fracture. Mandible intact. Mandibular condyles normally situated. No acute abnormality about the dentition. Orbits: Globes intact. Normal retro-orbital hematoma or other pathology. Bony orbits intact without fracture. Sinuses: Paranasal sinuses are clear.  No hemosinus. Soft tissues: Large right periorbital/facial contusion. CT CERVICAL SPINE FINDINGS Alignment: Straightening of the normal cervical lordosis. No listhesis. Skull base and vertebrae: Skull base intact. Normal C1-2 articulations are preserved in the dens is intact. Vertebral body heights maintained. No acute fracture. Soft tissues and spinal canal: Soft tissues of the neck demonstrate no acute finding. No abnormal prevertebral edema. Spinal canal within normal limits. Disc levels: Mild right-sided facet arthrosis at C2-3 and C3-4. No significant spinal stenosis within the cervical spine. Upper chest: Visualized upper chest demonstrates no acute finding. Upper lobe predominant centrilobular emphysema noted. Other: None. IMPRESSION: CT HEAD: 1. Acute bilateral subdural hematomas, right larger than left as above. Associated trace 1-2 mm right-to-left midline shift. 2. No other acute intracranial abnormality. 3. Mild age-related cerebral atrophy with chronic small vessel ischemic disease. CT MAXILLOFACIAL: 1. Large right periorbital/facial soft tissue contusion. 2. No other acute maxillofacial injury. No fracture. Intact globes with no retro-orbital pathology. CT CERVICAL SPINE: No acute traumatic injury within the cervical spine. Critical Value/emergent results were called by telephone at the time of interpretation on 09/26/2018 at 10:50 pm to Dr. Shelby Dubin , who verbally acknowledged these results. Electronically  Signed   By: Jeannine Boga M.D.   On: 09/26/2018 22:52   Ct Cervical Spine Wo Contrast  Result Date: 09/26/2018 CLINICAL DATA:  Initial evaluation for acute trauma, fall. EXAM: CT HEAD WITHOUT CONTRAST CT MAXILLOFACIAL WITHOUT CONTRAST CT CERVICAL SPINE WITHOUT CONTRAST TECHNIQUE: Multidetector CT imaging of the head, cervical spine, and maxillofacial structures were performed using the standard protocol without intravenous contrast. Multiplanar CT image reconstructions of the cervical spine and maxillofacial structures were also generated. COMPARISON:  None. FINDINGS: CT  HEAD FINDINGS Brain: Generalized age-related cerebral atrophy with mild chronic small vessel ischemic disease. Acute subdural hematoma overlies the right cerebral convexity, measuring up to 6 mm in maximal thickness. Additional smaller subdural hemorrhage seen overlying the left parietal convexity, measuring up to 5 mm in maximal diameter. Associated trace 1-2 mm right-to-left shift. No hydrocephalus or ventricular trapping. No other acute intracranial hemorrhage. No acute large vessel territory infarct. No mass lesion. Vascular: No hyperdense vessel. Scattered vascular calcifications noted within the carotid siphons. Skull: Soft tissue contusion at the right periorbital region. Calvarium intact. Other: Trace left mastoid effusion noted, of doubtful significance. CT MAXILLOFACIAL FINDINGS Osseous: A zygomatic arches are intact. No acute maxillary fracture. Pterygoid plates intact. No acute nasal bone fracture. Mild left-to-right nasal septal deviation without associated fracture. Mandible intact. Mandibular condyles normally situated. No acute abnormality about the dentition. Orbits: Globes intact. Normal retro-orbital hematoma or other pathology. Bony orbits intact without fracture. Sinuses: Paranasal sinuses are clear.  No hemosinus. Soft tissues: Large right periorbital/facial contusion. CT CERVICAL SPINE FINDINGS Alignment:  Straightening of the normal cervical lordosis. No listhesis. Skull base and vertebrae: Skull base intact. Normal C1-2 articulations are preserved in the dens is intact. Vertebral body heights maintained. No acute fracture. Soft tissues and spinal canal: Soft tissues of the neck demonstrate no acute finding. No abnormal prevertebral edema. Spinal canal within normal limits. Disc levels: Mild right-sided facet arthrosis at C2-3 and C3-4. No significant spinal stenosis within the cervical spine. Upper chest: Visualized upper chest demonstrates no acute finding. Upper lobe predominant centrilobular emphysema noted. Other: None. IMPRESSION: CT HEAD: 1. Acute bilateral subdural hematomas, right larger than left as above. Associated trace 1-2 mm right-to-left midline shift. 2. No other acute intracranial abnormality. 3. Mild age-related cerebral atrophy with chronic small vessel ischemic disease. CT MAXILLOFACIAL: 1. Large right periorbital/facial soft tissue contusion. 2. No other acute maxillofacial injury. No fracture. Intact globes with no retro-orbital pathology. CT CERVICAL SPINE: No acute traumatic injury within the cervical spine. Critical Value/emergent results were called by telephone at the time of interpretation on 09/26/2018 at 10:50 pm to Dr. Shelby Dubin , who verbally acknowledged these results. Electronically Signed   By: Jeannine Boga M.D.   On: 09/26/2018 22:52   Dg Hand Complete Right  Result Date: 09/26/2018 CLINICAL DATA:  Fall EXAM: RIGHT HAND - COMPLETE 3+ VIEW COMPARISON:  None. FINDINGS: Bones appear osteopenic. Mild arthritis at the first [facility] joint. Degenerative changes at the DIP and PIP joints. Acute nondisplaced fracture ulnar styloid process. Acute, mildly comminuted and impacted intra-articular fracture at the distal radius. IMPRESSION: 1. Acute comminuted impacted intra-articular fracture of the distal radius 2. Acute nondisplaced ulnar styloid process fracture Electronically  Signed   By: Donavan Foil M.D.   On: 09/26/2018 21:46   Ct Maxillofacial Wo Contrast  Result Date: 09/26/2018 CLINICAL DATA:  Initial evaluation for acute trauma, fall. EXAM: CT HEAD WITHOUT CONTRAST CT MAXILLOFACIAL WITHOUT CONTRAST CT CERVICAL SPINE WITHOUT CONTRAST TECHNIQUE: Multidetector CT imaging of the head, cervical spine, and maxillofacial structures were performed using the standard protocol without intravenous contrast. Multiplanar CT image reconstructions of the cervical spine and maxillofacial structures were also generated. COMPARISON:  None. FINDINGS: CT HEAD FINDINGS Brain: Generalized age-related cerebral atrophy with mild chronic small vessel ischemic disease. Acute subdural hematoma overlies the right cerebral convexity, measuring up to 6 mm in maximal thickness. Additional smaller subdural hemorrhage seen overlying the left parietal convexity, measuring up to 5 mm in maximal diameter. Associated trace 1-2  mm right-to-left shift. No hydrocephalus or ventricular trapping. No other acute intracranial hemorrhage. No acute large vessel territory infarct. No mass lesion. Vascular: No hyperdense vessel. Scattered vascular calcifications noted within the carotid siphons. Skull: Soft tissue contusion at the right periorbital region. Calvarium intact. Other: Trace left mastoid effusion noted, of doubtful significance. CT MAXILLOFACIAL FINDINGS Osseous: A zygomatic arches are intact. No acute maxillary fracture. Pterygoid plates intact. No acute nasal bone fracture. Mild left-to-right nasal septal deviation without associated fracture. Mandible intact. Mandibular condyles normally situated. No acute abnormality about the dentition. Orbits: Globes intact. Normal retro-orbital hematoma or other pathology. Bony orbits intact without fracture. Sinuses: Paranasal sinuses are clear.  No hemosinus. Soft tissues: Large right periorbital/facial contusion. CT CERVICAL SPINE FINDINGS Alignment: Straightening of  the normal cervical lordosis. No listhesis. Skull base and vertebrae: Skull base intact. Normal C1-2 articulations are preserved in the dens is intact. Vertebral body heights maintained. No acute fracture. Soft tissues and spinal canal: Soft tissues of the neck demonstrate no acute finding. No abnormal prevertebral edema. Spinal canal within normal limits. Disc levels: Mild right-sided facet arthrosis at C2-3 and C3-4. No significant spinal stenosis within the cervical spine. Upper chest: Visualized upper chest demonstrates no acute finding. Upper lobe predominant centrilobular emphysema noted. Other: None. IMPRESSION: CT HEAD: 1. Acute bilateral subdural hematomas, right larger than left as above. Associated trace 1-2 mm right-to-left midline shift. 2. No other acute intracranial abnormality. 3. Mild age-related cerebral atrophy with chronic small vessel ischemic disease. CT MAXILLOFACIAL: 1. Large right periorbital/facial soft tissue contusion. 2. No other acute maxillofacial injury. No fracture. Intact globes with no retro-orbital pathology. CT CERVICAL SPINE: No acute traumatic injury within the cervical spine. Critical Value/emergent results were called by telephone at the time of interpretation on 09/26/2018 at 10:50 pm to Dr. Shelby Dubin , who verbally acknowledged these results. Electronically Signed   By: Jeannine Boga M.D.   On: 09/26/2018 22:52    Review of Systems  Constitutional: Negative for weight loss.  HENT: Negative for ear discharge, ear pain, hearing loss and tinnitus.   Eyes: Negative for blurred vision, double vision, photophobia and pain.  Respiratory: Negative for cough, sputum production and shortness of breath.   Cardiovascular: Negative for chest pain.  Gastrointestinal: Negative for abdominal pain, nausea and vomiting.  Genitourinary: Negative for dysuria, flank pain, frequency and urgency.  Musculoskeletal: Positive for joint pain (RUE). Negative for back pain,  falls, myalgias and neck pain.  Neurological: Negative for dizziness, tingling, sensory change, focal weakness, loss of consciousness and headaches.  Endo/Heme/Allergies: Does not bruise/bleed easily.  Psychiatric/Behavioral: Negative for depression, memory loss and substance abuse. The patient is not nervous/anxious.    Blood pressure 111/61, pulse 99, temperature 99.2 F (37.3 C), resp. rate 20, height 5\' 4"  (1.626 m), weight 76.2 kg, SpO2 98 %. Physical Exam  Constitutional: She appears well-developed and well-nourished. No distress.  HENT:  Head: Normocephalic.  Eyes: Conjunctivae are normal. Right eye exhibits no discharge. Left eye exhibits no discharge. No scleral icterus.  Neck: Normal range of motion.  Cardiovascular: Normal rate and regular rhythm.  Respiratory: Effort normal. No respiratory distress.  Musculoskeletal:     Comments: Right shoulder, elbow, wrist, digits- no skin wounds, TTP shoulder, elbow, wrist, mild-mod, unsplinted  Sens  Ax/R/M/U intact  Mot   Ax/ R/ PIN/ M/ AIN/ U intact  Rad 2+  Neurological: She is alert.  Skin: Skin is warm and dry. She is not diaphoretic.  Psychiatric: She has a  normal mood and affect. Her behavior is normal.    Assessment/Plan: Right prox humerus fx -- Should do well non-operatively. Sling and gentle motion ok. NWB. Right distal humerus fx -- Plan for ORIF, tentatively scheduled for Thursday with Dr. Percell Miller. Will place in splint. NWB. Right wrist fx -- Plan non-operative management. Will splint. NWB. SDH Multiple medical problems including COPD, hyperlipidemia, breast cancer, lung cancer, and thrombocytopenia -- per primary service    Lisette Abu, PA-C Orthopedic Surgery [phone] 09/27/2018, 9:52 AM

## 2018-09-27 NOTE — ED Notes (Signed)
ED TO INPATIENT HANDOFF REPORT  ED Nurse Name and Phone #: Kathie Rhodes RN  S Name/Age/Gender Roselyn Meier 73 y.o. female Room/Bed: 027C/027C  Code Status   Code Status: DNR  Home/SNF/Other Home Patient oriented to: self, place, time and situation Is this baseline? Yes   Triage Complete: Triage complete  Chief Complaint fall  Triage Note [facility]- from home. Pt fell getting out of bed on the floor. No LOC. No blood thinners. Pt has right arm pain. Pt fell and hit her right head. No neck or back pain. Pt has swelling to her right eye and it is bruised.     130 HR 130/80 BP 97% 3L at baseline 97.1 temp 16 RR   Allergies No Known Allergies  Level of Care/Admitting Diagnosis ED Disposition    ED Disposition Condition [facility] Area: [place] [100100]  Level of Care: Progressive [102]  Covid Evaluation: N/A  Diagnosis: Subdural hematoma [facility]) [[id number]]  Admitting Physician: Rise Patience [phone]  Attending Physician: Rise Patience [phone]  Estimated length of stay: past midnight tomorrow  Certification:: I certify this patient will need inpatient services for at least 2 midnights  PT Class (Do Not Modify): Inpatient [101]  PT Acc Code (Do Not Modify): Private [1]       B Medical/Surgery History Past Medical History:  Diagnosis Date  . Breast cancer ([place]) 01/05/14   left breast  . COPD (chronic obstructive pulmonary disease) ([place])   . Depression   . Diverticulosis   . History of alcohol abuse    last 1980  . Hyperlipidemia   . Internal hemorrhoids   . Lung cancer ([place])   . Oxygen deficiency   . Personal history of radiation therapy 2015  . Radiation 03/23/14-04/19/14   Left breast  . Radiation 12/13/14-01/19/15   right upper/middle lobe of lung 70.2 Gy  . Requires supplemental oxygen    at night USES 2 LITERS   . Shortness of breath     WITH ACTIVITY  . Skin cancer    Squamous  . Vitamin D deficiency    . Wears glasses    Past Surgical History:  Procedure Laterality Date  . BREAST BIOPSY Left 01/15/2014   x2  . BREAST LUMPECTOMY Left 2015   radiation  . BREAST SURGERY  02/09/2014   Left luimpectomy  . CHOLECYSTECTOMY  2003  . COLONOSCOPY WITH PROPOFOL N/A 11/08/2014   Procedure: COLONOSCOPY WITH PROPOFOL;  Surgeon: Inda Castle, MD;  Location: [facility];  Service: Endoscopy;  Laterality: N/A;  . DENTAL SURGERY     implants  . EYE SURGERY Bilateral 2005   Cataract OD  . VIDEO BRONCHOSCOPY WITH ENDOBRONCHIAL NAVIGATION N/A 11/10/2014   Procedure: VIDEO BRONCHOSCOPY WITH ENDOBRONCHIAL NAVIGATION;  Surgeon: Melrose Nakayama, MD;  Location: [place];  Service: Thoracic;  Laterality: N/A;     A IV Location/Drains/Wounds Patient Lines/Drains/Airways Status   Active Line/Drains/Airways    Name:   Placement date:   Placement time:   Site:   Days:   Peripheral IV 11/10/14 Left Wrist   11/10/14    0705    Wrist   1417   Peripheral IV 09/26/18 Left Antecubital   09/26/18    2040    Antecubital   1   Incision (Closed) 02/09/14 Breast Left   02/09/14    1208     1691   Incision (Closed) 02/09/14 Axilla Left   02/09/14  1208     1691          Intake/Output Last 24 hours No intake or output data in the 24 hours ending 09/27/18 0033  Labs/Imaging Results for orders placed or performed during the hospital encounter of 09/26/18 (from the past 48 hour(s))  CBC with Differential     Status: Abnormal   Collection Time: 09/26/18  8:49 PM  Result Value Ref Range   WBC 9.8 4.0 - 10.5 K/uL   RBC 3.52 (L) 3.87 - 5.11 MIL/uL   Hemoglobin 9.7 (L) 12.0 - 15.0 g/dL   HCT 31.9 (L) 36.0 - 46.0 %   MCV 90.6 80.0 - 100.0 fL   MCH 27.6 26.0 - 34.0 pg   MCHC 30.4 30.0 - 36.0 g/dL   RDW 15.1 11.5 - 15.5 %   Platelets 38 (L) 150 - 400 K/uL    Comment: REPEATED TO VERIFY PLATELET COUNT CONFIRMED BY SMEAR SPECIMEN CHECKED FOR CLOTS Immature Platelet Fraction may be clinically indicated,  consider ordering this additional test [id number]    nRBC 0.0 0.0 - 0.2 %   Neutrophils Relative % 61 %   Neutro Abs 5.9 1.7 - 7.7 K/uL   Lymphocytes Relative 7 %   Lymphs Abs 0.7 0.7 - 4.0 K/uL   Monocytes Relative 30 %   Monocytes Absolute 2.9 (H) 0.1 - 1.0 K/uL   Eosinophils Relative 0 %   Eosinophils Absolute 0.0 0.0 - 0.5 K/uL   Basophils Relative 0 %   Basophils Absolute 0.0 0.0 - 0.1 K/uL   Immature Granulocytes 2 %   Abs Immature Granulocytes 0.22 (H) 0.00 - 0.07 K/uL    Comment: Performed at [facility], [address], [place], [place] [address]  Basic metabolic panel     Status: Abnormal   Collection Time: 09/26/18  8:49 PM  Result Value Ref Range   Sodium 140 135 - 145 mmol/L   Potassium 4.0 3.5 - 5.1 mmol/L   Chloride 102 98 - 111 mmol/L   CO2 32 22 - 32 mmol/L   Glucose, Bld 116 (H) 70 - 99 mg/dL   BUN 14 8 - 23 mg/dL   Creatinine, Ser 0.66 0.44 - 1.00 mg/dL   Calcium 9.0 8.9 - 10.3 mg/dL   GFR calc non Af Amer >60 >60 mL/min   GFR calc Af Amer >60 >60 mL/min   Anion gap 6 5 - 15    Comment: Performed at [facility], [place] [address], [place], [place] [address]  Type and screen [place]     Status: None (Preliminary result)   Collection Time: 09/26/18 11:44 PM  Result Value Ref Range   ABO/RH(D) PENDING    Antibody Screen PENDING    Sample Expiration      09/29/2018 Performed at [facility], [place] [address] [place], [place] [address]   Protime-INR     Status: None   Collection Time: 09/26/18 11:47 PM  Result Value Ref Range   Prothrombin Time 14.0 11.4 - 15.2 seconds   INR 1.1 0.8 - 1.2    Comment: (NOTE) INR goal varies based on device and disease states. Performed at [facility], [place] [address], [place], [place] [address]   PTT     Status: None   Collection Time: 09/26/18 11:47 PM  Result Value Ref Range   aPTT 32 24 - 36 seconds    Comment: Performed at [place]  [address],  [place], [place] [address]   Dg Chest 2 View  Result Date: 09/26/2018 CLINICAL DATA:  Shortness of breath EXAM: CHEST - 2 VIEW COMPARISON:  09/03/2017, PET CT 11/24/2016 FINDINGS: Masslike opacity and architectural distortion in the right hilar region, slightly more opaque. Volume loss in the right hemithorax consistent with post treatment change. No pleural effusion. Stable cardiomediastinal silhouette with aortic atherosclerosis. No pneumothorax. Subtle fracture proximal right humerus. Age indeterminate right seventh rib fracture. IMPRESSION: 1. Negative for pneumothorax. 2. Masslike opacity with architectural distortion in the right hilar region, possibly due to post treatment change however this region appears slightly more opaque compared to prior and recurrent mass is possible. 3. Subtle fracture involving the proximal right humerus. 4. Age indeterminate right seventh rib fracture Electronically Signed   By: Donavan Foil M.D.   On: 09/26/2018 21:54   Dg Pelvis 1-2 Views  Result Date: 09/26/2018 CLINICAL DATA:  Fall EXAM: PELVIS - 1-2 VIEW COMPARISON:  None. FINDINGS: SI joints are intact. Pubic symphysis and rami show no fracture. No fracture or dislocation is evident. IMPRESSION: No acute osseous abnormality Electronically Signed   By: Donavan Foil M.D.   On: 09/26/2018 21:44   Dg Shoulder Right  Result Date: 09/26/2018 CLINICAL DATA:  Fall EXAM: RIGHT SHOULDER - 2+ VIEW COMPARISON:  None. FINDINGS: Acute mildly comminuted fracture at the greater tuberosity of the humerus. Suspected faint transverse lucency through the neck of the humerus. No dislocation. Mild AC joint degenerative change. Architectural distortion at the right hilus IMPRESSION: 1. Acute comminuted fracture involving the greater tuberosity of the humerus with suspected transverse fracture lucency at the right humeral neck. Electronically Signed   By: Donavan Foil M.D.   On: 09/26/2018 21:43   Dg Elbow Complete Right  Result Date:  09/26/2018 CLINICAL DATA:  Fall EXAM: RIGHT ELBOW - COMPLETE 3+ VIEW COMPARISON:  None. FINDINGS: Acute, mildly comminuted fracture involving the distal humerus above the level of the condyles. About 1/3 bone with ulnar displacement of distal fracture fragment. Associated large elbow effusion. Radial head alignment is within normal limits. IMPRESSION: Acute, mildly comminuted and displaced distal humerus fracture with associated elbow effusion Electronically Signed   By: Donavan Foil M.D.   On: 09/26/2018 21:48   Dg Forearm Right  Result Date: 09/26/2018 CLINICAL DATA:  Fall EXAM: RIGHT FOREARM - 2 VIEW COMPARISON:  None. FINDINGS: Acute minimally displaced fracture at the ulnar styloid process. Acute impacted intra-articular distal radius fracture with mild dorsal displacement. Incompletely visualized distal humerus fracture. IMPRESSION: 1. Acute impacted intra-articular distal radius fracture. Acute ulnar styloid process fracture 2. Incompletely visualized displaced distal humerus fracture Electronically Signed   By: Donavan Foil M.D.   On: 09/26/2018 21:47   Ct Head Wo Contrast  Result Date: 09/26/2018 CLINICAL DATA:  Initial evaluation for acute trauma, fall. EXAM: CT HEAD WITHOUT CONTRAST CT MAXILLOFACIAL WITHOUT CONTRAST CT CERVICAL SPINE WITHOUT CONTRAST TECHNIQUE: Multidetector CT imaging of the head, cervical spine, and maxillofacial structures were performed using the standard protocol without intravenous contrast. Multiplanar CT image reconstructions of the cervical spine and maxillofacial structures were also generated. COMPARISON:  None. FINDINGS: CT HEAD FINDINGS Brain: Generalized age-related cerebral atrophy with mild chronic small vessel ischemic disease. Acute subdural hematoma overlies the right cerebral convexity, measuring up to 6 mm in maximal thickness. Additional smaller subdural hemorrhage seen overlying the left parietal convexity, measuring up to 5 mm in maximal diameter.  Associated trace 1-2 mm right-to-left shift. No hydrocephalus or ventricular trapping.  No other acute intracranial hemorrhage. No acute large vessel territory infarct. No mass lesion. Vascular: No hyperdense vessel. Scattered vascular calcifications noted within the carotid siphons. Skull: Soft tissue contusion at the right periorbital region. Calvarium intact. Other: Trace left mastoid effusion noted, of doubtful significance. CT MAXILLOFACIAL FINDINGS Osseous: A zygomatic arches are intact. No acute maxillary fracture. Pterygoid plates intact. No acute nasal bone fracture. Mild left-to-right nasal septal deviation without associated fracture. Mandible intact. Mandibular condyles normally situated. No acute abnormality about the dentition. Orbits: Globes intact. Normal retro-orbital hematoma or other pathology. Bony orbits intact without fracture. Sinuses: Paranasal sinuses are clear.  No hemosinus. Soft tissues: Large right periorbital/facial contusion. CT CERVICAL SPINE FINDINGS Alignment: Straightening of the normal cervical lordosis. No listhesis. Skull base and vertebrae: Skull base intact. Normal C1-2 articulations are preserved in the dens is intact. Vertebral body heights maintained. No acute fracture. Soft tissues and spinal canal: Soft tissues of the neck demonstrate no acute finding. No abnormal prevertebral edema. Spinal canal within normal limits. Disc levels: Mild right-sided facet arthrosis at C2-3 and C3-4. No significant spinal stenosis within the cervical spine. Upper chest: Visualized upper chest demonstrates no acute finding. Upper lobe predominant centrilobular emphysema noted. Other: None. IMPRESSION: CT HEAD: 1. Acute bilateral subdural hematomas, right larger than left as above. Associated trace 1-2 mm right-to-left midline shift. 2. No other acute intracranial abnormality. 3. Mild age-related cerebral atrophy with chronic small vessel ischemic disease. CT MAXILLOFACIAL: 1. Large right  periorbital/facial soft tissue contusion. 2. No other acute maxillofacial injury. No fracture. Intact globes with no retro-orbital pathology. CT CERVICAL SPINE: No acute traumatic injury within the cervical spine. Critical Value/emergent results were called by telephone at the time of interpretation on 09/26/2018 at 10:50 pm to Dr. Shelby Dubin , who verbally acknowledged these results. Electronically Signed   By: Jeannine Boga M.D.   On: 09/26/2018 22:52   Ct Cervical Spine Wo Contrast  Result Date: 09/26/2018 CLINICAL DATA:  Initial evaluation for acute trauma, fall. EXAM: CT HEAD WITHOUT CONTRAST CT MAXILLOFACIAL WITHOUT CONTRAST CT CERVICAL SPINE WITHOUT CONTRAST TECHNIQUE: Multidetector CT imaging of the head, cervical spine, and maxillofacial structures were performed using the standard protocol without intravenous contrast. Multiplanar CT image reconstructions of the cervical spine and maxillofacial structures were also generated. COMPARISON:  None. FINDINGS: CT HEAD FINDINGS Brain: Generalized age-related cerebral atrophy with mild chronic small vessel ischemic disease. Acute subdural hematoma overlies the right cerebral convexity, measuring up to 6 mm in maximal thickness. Additional smaller subdural hemorrhage seen overlying the left parietal convexity, measuring up to 5 mm in maximal diameter. Associated trace 1-2 mm right-to-left shift. No hydrocephalus or ventricular trapping. No other acute intracranial hemorrhage. No acute large vessel territory infarct. No mass lesion. Vascular: No hyperdense vessel. Scattered vascular calcifications noted within the carotid siphons. Skull: Soft tissue contusion at the right periorbital region. Calvarium intact. Other: Trace left mastoid effusion noted, of doubtful significance. CT MAXILLOFACIAL FINDINGS Osseous: A zygomatic arches are intact. No acute maxillary fracture. Pterygoid plates intact. No acute nasal bone fracture. Mild left-to-right nasal  septal deviation without associated fracture. Mandible intact. Mandibular condyles normally situated. No acute abnormality about the dentition. Orbits: Globes intact. Normal retro-orbital hematoma or other pathology. Bony orbits intact without fracture. Sinuses: Paranasal sinuses are clear.  No hemosinus. Soft tissues: Large right periorbital/facial contusion. CT CERVICAL SPINE FINDINGS Alignment: Straightening of the normal cervical lordosis. No listhesis. Skull base and vertebrae: Skull base intact. Normal C1-2 articulations are preserved in the  dens is intact. Vertebral body heights maintained. No acute fracture. Soft tissues and spinal canal: Soft tissues of the neck demonstrate no acute finding. No abnormal prevertebral edema. Spinal canal within normal limits. Disc levels: Mild right-sided facet arthrosis at C2-3 and C3-4. No significant spinal stenosis within the cervical spine. Upper chest: Visualized upper chest demonstrates no acute finding. Upper lobe predominant centrilobular emphysema noted. Other: None. IMPRESSION: CT HEAD: 1. Acute bilateral subdural hematomas, right larger than left as above. Associated trace 1-2 mm right-to-left midline shift. 2. No other acute intracranial abnormality. 3. Mild age-related cerebral atrophy with chronic small vessel ischemic disease. CT MAXILLOFACIAL: 1. Large right periorbital/facial soft tissue contusion. 2. No other acute maxillofacial injury. No fracture. Intact globes with no retro-orbital pathology. CT CERVICAL SPINE: No acute traumatic injury within the cervical spine. Critical Value/emergent results were called by telephone at the time of interpretation on 09/26/2018 at 10:50 pm to Dr. Shelby Dubin , who verbally acknowledged these results. Electronically Signed   By: Jeannine Boga M.D.   On: 09/26/2018 22:52   Dg Hand Complete Right  Result Date: 09/26/2018 CLINICAL DATA:  Fall EXAM: RIGHT HAND - COMPLETE 3+ VIEW COMPARISON:  None. FINDINGS:  Bones appear osteopenic. Mild arthritis at the first [facility] joint. Degenerative changes at the DIP and PIP joints. Acute nondisplaced fracture ulnar styloid process. Acute, mildly comminuted and impacted intra-articular fracture at the distal radius. IMPRESSION: 1. Acute comminuted impacted intra-articular fracture of the distal radius 2. Acute nondisplaced ulnar styloid process fracture Electronically Signed   By: Donavan Foil M.D.   On: 09/26/2018 21:46   Ct Maxillofacial Wo Contrast  Result Date: 09/26/2018 CLINICAL DATA:  Initial evaluation for acute trauma, fall. EXAM: CT HEAD WITHOUT CONTRAST CT MAXILLOFACIAL WITHOUT CONTRAST CT CERVICAL SPINE WITHOUT CONTRAST TECHNIQUE: Multidetector CT imaging of the head, cervical spine, and maxillofacial structures were performed using the standard protocol without intravenous contrast. Multiplanar CT image reconstructions of the cervical spine and maxillofacial structures were also generated. COMPARISON:  None. FINDINGS: CT HEAD FINDINGS Brain: Generalized age-related cerebral atrophy with mild chronic small vessel ischemic disease. Acute subdural hematoma overlies the right cerebral convexity, measuring up to 6 mm in maximal thickness. Additional smaller subdural hemorrhage seen overlying the left parietal convexity, measuring up to 5 mm in maximal diameter. Associated trace 1-2 mm right-to-left shift. No hydrocephalus or ventricular trapping. No other acute intracranial hemorrhage. No acute large vessel territory infarct. No mass lesion. Vascular: No hyperdense vessel. Scattered vascular calcifications noted within the carotid siphons. Skull: Soft tissue contusion at the right periorbital region. Calvarium intact. Other: Trace left mastoid effusion noted, of doubtful significance. CT MAXILLOFACIAL FINDINGS Osseous: A zygomatic arches are intact. No acute maxillary fracture. Pterygoid plates intact. No acute nasal bone fracture. Mild left-to-right nasal septal  deviation without associated fracture. Mandible intact. Mandibular condyles normally situated. No acute abnormality about the dentition. Orbits: Globes intact. Normal retro-orbital hematoma or other pathology. Bony orbits intact without fracture. Sinuses: Paranasal sinuses are clear.  No hemosinus. Soft tissues: Large right periorbital/facial contusion. CT CERVICAL SPINE FINDINGS Alignment: Straightening of the normal cervical lordosis. No listhesis. Skull base and vertebrae: Skull base intact. Normal C1-2 articulations are preserved in the dens is intact. Vertebral body heights maintained. No acute fracture. Soft tissues and spinal canal: Soft tissues of the neck demonstrate no acute finding. No abnormal prevertebral edema. Spinal canal within normal limits. Disc levels: Mild right-sided facet arthrosis at C2-3 and C3-4. No significant spinal stenosis within the cervical spine.  Upper chest: Visualized upper chest demonstrates no acute finding. Upper lobe predominant centrilobular emphysema noted. Other: None. IMPRESSION: CT HEAD: 1. Acute bilateral subdural hematomas, right larger than left as above. Associated trace 1-2 mm right-to-left midline shift. 2. No other acute intracranial abnormality. 3. Mild age-related cerebral atrophy with chronic small vessel ischemic disease. CT MAXILLOFACIAL: 1. Large right periorbital/facial soft tissue contusion. 2. No other acute maxillofacial injury. No fracture. Intact globes with no retro-orbital pathology. CT CERVICAL SPINE: No acute traumatic injury within the cervical spine. Critical Value/emergent results were called by telephone at the time of interpretation on 09/26/2018 at 10:50 pm to Dr. Shelby Dubin , who verbally acknowledged these results. Electronically Signed   By: Jeannine Boga M.D.   On: 09/26/2018 22:52    Pending Labs Unresulted Labs (From admission, onward)    Start     Ordered   09/27/18 2956  Basic metabolic panel  Tomorrow morning,   R      09/27/18 0023   09/27/18 0500  Hepatic function panel  Tomorrow morning,   R     09/27/18 0023   09/27/18 0500  CBC WITH DIFFERENTIAL  Tomorrow morning,   R     09/27/18 0023   09/26/18 2317  Prepare Pheresed Platelets  (Adult Blood Administration - Platelets (Pheresed))  Once,   R    Question Answer Comment  Number of Apheresis Units 1 unit (6-10 packs)   Transfusion Indications Surgery with PLT count <= 50,000/mm      09/26/18 2318          Vitals/Pain Today's Vitals   09/26/18 2153 09/26/18 2255 09/26/18 2300 09/26/18 2330  BP:  109/68 108/61 98/81  Pulse: (!) 125 (!) 129 (!) 134 (!) 137  Resp: (!) 28 20 18  (!) 29  Temp:      TempSrc:      SpO2: 100% 99% 99% 98%  Weight:      Height:      PainSc:        Isolation Precautions No active isolations  Medications Medications  0.9 %  sodium chloride infusion (Manually program via Guardrails IV Fluids) (has no administration in time range)  morphine 4 MG/ML injection 4 mg (has no administration in time range)  Tdap (BOOSTRIX) injection 0.5 mL (has no administration in time range)  anastrozole (ARIMIDEX) tablet 1 mg (has no administration in time range)  atorvastatin (LIPITOR) tablet 40 mg (has no administration in time range)  cholestyramine (QUESTRAN) packet 4 g (has no administration in time range)  magnesium oxide (MAG-OX) tablet 400 mg (has no administration in time range)  omega-3 acid ethyl esters (LOVAZA) capsule 1 g (has no administration in time range)  umeclidinium-vilanterol (ANORO ELLIPTA) 62.5-25 MCG/INH 1 puff (has no administration in time range)  acetaminophen (TYLENOL) tablet 650 mg (has no administration in time range)    Or  acetaminophen (TYLENOL) suppository 650 mg (has no administration in time range)  ondansetron (ZOFRAN) tablet 4 mg (has no administration in time range)    Or  ondansetron (ZOFRAN) injection 4 mg (has no administration in time range)  0.9 %  sodium chloride infusion (has no  administration in time range)  morphine 2 MG/ML injection 1 mg (has no administration in time range)  albuterol (VENTOLIN HFA) 108 (90 Base) MCG/ACT inhaler 2 puff (2 puffs Inhalation Given 09/26/18 2040)  methylPREDNISolone sodium succinate (SOLU-MEDROL) 125 mg/2 mL injection 125 mg (125 mg Intravenous Given 09/26/18 2040)  albuterol (VENTOLIN HFA) 108 (90  Base) MCG/ACT inhaler 6 puff (6 puffs Inhalation Given 09/26/18 2253)  magnesium sulfate IVPB 2 g 50 mL (0 g Intravenous Hold 09/26/18 2255)    Mobility walks Low fall risk   Focused Assessments Cardiac Assessment Handoff:    No results found for: CKTOTAL, CKMB, CKMBINDEX, TROPONINI No results found for: DDIMER Does the Patient currently have chest pain? No      R Recommendations: See Admitting Provider Note  Report given to:   Additional Notes: Pt alert and oriented, no issues at this time. Pt is a DNR.  Will be given pain medication before transport

## 2018-09-27 NOTE — H&P (View-Only) (Signed)
Reason for Consult:RUE fxs Referring Physician: A Jazleen Williams is an 73 y.o. female.  HPI: Summer Williams was trying to get out of bed yesterday when her pant leg got caught on Summer piece of the frame. She tried to get it unstuck but rolled out of the bed. She put her arm out to break her fall. She had immediate pain in it. She came to the hospital where she was found to have multiple RUE fxs as well as Summer SDH. She was admitted and orthopedic surgery was consulted. She is RHD.  Past Medical History:  Diagnosis Date  . Breast cancer ([place]) 01/05/14   left breast  . COPD (chronic obstructive pulmonary disease) ([place])   . Depression   . Diverticulosis   . History of alcohol abuse    last 1980  . Hyperlipidemia   . Internal hemorrhoids   . Lung cancer ([place])   . Oxygen deficiency   . Personal history of radiation therapy 2015  . Radiation 03/23/14-04/19/14   Left breast  . Radiation 12/13/14-01/19/15   right upper/middle lobe of lung 70.2 Gy  . Requires supplemental oxygen    at night USES 2 LITERS   . Shortness of breath     WITH ACTIVITY  . Skin cancer    Squamous  . Vitamin D deficiency   . Wears glasses     Past Surgical History:  Procedure Laterality Date  . BREAST BIOPSY Left 01/15/2014   x2  . BREAST LUMPECTOMY Left 2015   radiation  . BREAST SURGERY  02/09/2014   Left luimpectomy  . CHOLECYSTECTOMY  2003  . COLONOSCOPY WITH PROPOFOL N/Summer 11/08/2014   Procedure: COLONOSCOPY WITH PROPOFOL;  Surgeon: Inda Castle, MD;  Location: [facility];  Service: Endoscopy;  Laterality: N/Summer;  . DENTAL SURGERY     implants  . EYE SURGERY Bilateral 2005   Cataract OD  . VIDEO BRONCHOSCOPY WITH ENDOBRONCHIAL NAVIGATION N/Summer 11/10/2014   Procedure: VIDEO BRONCHOSCOPY WITH ENDOBRONCHIAL NAVIGATION;  Surgeon: Melrose Nakayama, MD;  Location: [facility] OR;  Service: Thoracic;  Laterality: N/Summer;    Family History  Problem Relation Age of Onset  . Heart disease Father   . Lung cancer Maternal  Grandfather   . Leukemia Paternal Grandfather   . Cholelithiasis Mother   . Ovarian cancer Maternal Aunt   . Breast cancer Maternal Aunt   . Colon cancer Neg Hx   . Colon polyps Neg Hx   . Esophageal cancer Neg Hx   . Kidney disease Neg Hx   . Gallbladder disease Neg Hx   . Stomach cancer Neg Hx   . Rectal cancer Neg Hx     Social History:  reports that she quit smoking about 13 years ago. Her smoking use included cigarettes. She has Summer 50.00 pack-year smoking history. She has never used smokeless tobacco. She reports that she does not drink alcohol or use drugs.  Allergies: No Known Allergies  Medications: I have reviewed the patient's current medications.  Results for orders placed or performed during the hospital encounter of 09/26/18 (from the past 48 hour(s))  CBC with Differential     Status: Abnormal   Collection Time: 09/26/18  8:49 PM  Result Value Ref Range   WBC 9.8 4.0 - 10.5 K/uL   RBC 3.52 (L) 3.87 - 5.11 MIL/uL   Hemoglobin 9.7 (L) 12.0 - 15.0 g/dL   HCT 31.9 (L) 36.0 - 46.0 %   MCV 90.6 80.0 - 100.0 fL  MCH 27.6 26.0 - 34.0 pg   MCHC 30.4 30.0 - 36.0 g/dL   RDW 15.1 11.5 - 15.5 %   Platelets 38 (L) 150 - 400 K/uL    Comment: REPEATED TO VERIFY PLATELET COUNT CONFIRMED BY SMEAR SPECIMEN CHECKED FOR CLOTS Immature Platelet Fraction may be clinically indicated, consider ordering this additional test [id number]    nRBC 0.0 0.0 - 0.2 %   Neutrophils Relative % 61 %   Neutro Abs 5.9 1.7 - 7.7 K/uL   Lymphocytes Relative 7 %   Lymphs Abs 0.7 0.7 - 4.0 K/uL   Monocytes Relative 30 %   Monocytes Absolute 2.9 (H) 0.1 - 1.0 K/uL   Eosinophils Relative 0 %   Eosinophils Absolute 0.0 0.0 - 0.5 K/uL   Basophils Relative 0 %   Basophils Absolute 0.0 0.0 - 0.1 K/uL   Immature Granulocytes 2 %   Abs Immature Granulocytes 0.22 (H) 0.00 - 0.07 K/uL    Comment: Performed at [facility] [address] [place], [place] [address]  Basic metabolic panel      Status: Abnormal   Collection Time: 09/26/18  8:49 PM  Result Value Ref Range   Sodium 140 135 - 145 mmol/L   Potassium 4.0 3.5 - 5.1 mmol/L   Chloride 102 98 - 111 mmol/L   CO2 32 22 - 32 mmol/L   Glucose, Bld 116 (H) 70 - 99 mg/dL   BUN 14 8 - 23 mg/dL   Creatinine, Ser 0.66 0.44 - 1.00 mg/dL   Calcium 9.0 8.9 - 10.3 mg/dL   GFR calc non Af Amer >60 >60 mL/min   GFR calc Af Amer >60 >60 mL/min   Anion gap 6 5 - 15    Comment: Performed at [facility], [place] [address] [place], [place] [address]  Prepare Pheresed Platelets     Status: None (Preliminary result)   Collection Time: 09/26/18 11:27 PM  Result Value Ref Range   Unit Number [id number]    Blood Component Type PLTP LR1 PAS    Unit division 00    Status of Unit ISSUED    Transfusion Status      OK TO TRANSFUSE Performed at [facility] [address] [place], [place] [address]   Type and screen [place]     Status: None   Collection Time: 09/26/18 11:44 PM  Result Value Ref Range   ABO/RH(D) O POS    Antibody Screen NEG    Sample Expiration      09/29/2018 Performed at [facility], [place] [address], [place], [place] [address]   ABO/Rh     Status: None (Preliminary result)   Collection Time: 09/26/18 11:44 PM  Result Value Ref Range   ABO/RH(D)      O POS Performed at [facility] [address], [place], [place] [address]   Protime-INR     Status: None   Collection Time: 09/26/18 11:47 PM  Result Value Ref Range   Prothrombin Time 14.0 11.4 - 15.2 seconds   INR 1.1 0.8 - 1.2    Comment: (NOTE) INR goal varies based on device and disease states. Performed at [facility], [place] [address], [place], [place] [address]   PTT     Status: None   Collection Time: 09/26/18 11:47 PM  Result Value Ref Range   aPTT 32 24 - 36 seconds    Comment: Performed at [facility], [place] [address], [place], [place] [address]  CBC WITH DIFFERENTIAL     Status:  Abnormal   Collection Time: 09/27/18  7:36 AM  Result Value Ref Range   WBC 11.0 (H) 4.0 - 10.5 K/uL   RBC 2.89 (L) 3.87 - 5.11 MIL/uL   Hemoglobin 8.0 (L) 12.0 - 15.0 g/dL   HCT 25.5 (L) 36.0 - 46.0 %   MCV 88.2 80.0 - 100.0 fL   MCH 27.7 26.0 - 34.0 pg   MCHC 31.4 30.0 - 36.0 g/dL   RDW 15.2 11.5 - 15.5 %   Platelets 63 (L) 150 - 400 K/uL    Comment: REPEATED TO VERIFY Immature Platelet Fraction may be clinically indicated, consider ordering this additional test [id number] CONSISTENT WITH PREVIOUS RESULT    nRBC 0.0 0.0 - 0.2 %   Neutrophils Relative % 83 %   Neutro Abs 9.1 (H) 1.7 - 7.7 K/uL   Lymphocytes Relative 5 %   Lymphs Abs 0.6 (L) 0.7 - 4.0 K/uL   Monocytes Relative 7 %   Monocytes Absolute 0.8 0.1 - 1.0 K/uL   Eosinophils Relative 0 %   Eosinophils Absolute 0.0 0.0 - 0.5 K/uL   Basophils Relative 0 %   Basophils Absolute 0.0 0.0 - 0.1 K/uL   Immature Granulocytes 5 %   Abs Immature Granulocytes 0.49 (H) 0.00 - 0.07 K/uL    Comment: Performed at [facility] [address] [place], [place] [address]  Basic metabolic panel     Status: Abnormal   Collection Time: 09/27/18  7:36 AM  Result Value Ref Range   Sodium 143 135 - 145 mmol/L   Potassium 3.7 3.5 - 5.1 mmol/L   Chloride 105 98 - 111 mmol/L   CO2 30 22 - 32 mmol/L   Glucose, Bld 199 (H) 70 - 99 mg/dL   BUN 12 8 - 23 mg/dL   Creatinine, Ser 0.70 0.44 - 1.00 mg/dL   Calcium 8.7 (L) 8.9 - 10.3 mg/dL   GFR calc non Af Amer >60 >60 mL/min   GFR calc Af Amer >60 >60 mL/min   Anion gap 8 5 - 15    Comment: Performed at [facility], [place] [address] [place], [place] [address]  Vitamin B12     Status: None   Collection Time: 09/27/18  7:36 AM  Result Value Ref Range   Vitamin B-12 330 180 - 914 pg/mL    Comment: (NOTE) This assay is not validated for testing neonatal or myeloproliferative syndrome specimens for Vitamin B12 levels. Performed at [facility], [place] [address],  [place], [place] [address]   Folate     Status: None   Collection Time: 09/27/18  7:36 AM  Result Value Ref Range   Folate 18.9 >5.9 ng/mL    Comment: Performed at [facility] [address], [place], [place] [address]  Iron and TIBC     Status: Abnormal   Collection Time: 09/27/18  7:36 AM  Result Value Ref Range   Iron 31 28 - 170 ug/dL   TIBC 371 250 - 450 ug/dL   Saturation Ratios 8 (L) 10.4 - 31.8 %   UIBC 340 ug/dL    Comment: Performed at [facility], [place] [address], [place], [place] [address]  Ferritin     Status: None   Collection Time: 09/27/18  7:36 AM  Result Value Ref Range   Ferritin 12 11 -  307 ng/mL    Comment: Performed at [facility], [place] [address] [place], [place] [address]  Reticulocytes     Status: Abnormal   Collection Time: 09/27/18  7:36 AM  Result Value Ref Range   Retic Ct Pct 1.4 0.4 - 3.1 %   RBC. 2.91 (L) 3.87 - 5.11 MIL/uL   Retic Count, Absolute 41.0 19.0 - 186.0 K/uL   Immature Retic Fract 18.6 (H) 2.3 - 15.9 %    Comment: Performed at [facility] [address] [place], [place] [address]  Hepatic function panel     Status: Abnormal   Collection Time: 09/27/18  7:36 AM  Result Value Ref Range   Total Protein 5.9 (L) 6.5 - 8.1 g/dL   Albumin 3.5 3.5 - 5.0 g/dL   AST 37 15 - 41 U/L   ALT 22 0 - 44 U/L   Alkaline Phosphatase 65 38 - 126 U/L   Total Bilirubin 0.8 0.3 - 1.2 mg/dL   Bilirubin, Direct 0.1 0.0 - 0.2 mg/dL   Indirect Bilirubin 0.7 0.3 - 0.9 mg/dL    Comment: Performed at [facility] [address], [place], [place] [address]    Dg Chest 2 View  Result Date: 09/26/2018 CLINICAL DATA:  Shortness of breath EXAM: CHEST - 2 VIEW COMPARISON:  09/03/2017, PET CT 11/24/2016 FINDINGS: Masslike opacity and architectural distortion in the right hilar region, slightly more opaque. Volume loss in the right hemithorax consistent with post treatment change. No pleural effusion. Stable cardiomediastinal silhouette  with aortic atherosclerosis. No pneumothorax. Subtle fracture proximal right humerus. Age indeterminate right seventh rib fracture. IMPRESSION: 1. Negative for pneumothorax. 2. Masslike opacity with architectural distortion in the right hilar region, possibly due to post treatment change however this region appears slightly more opaque compared to prior and recurrent mass is possible. 3. Subtle fracture involving the proximal right humerus. 4. Age indeterminate right seventh rib fracture Electronically Signed   By: Donavan Foil M.D.   On: 09/26/2018 21:54   Dg Pelvis 1-2 Views  Result Date: 09/26/2018 CLINICAL DATA:  Fall EXAM: PELVIS - 1-2 VIEW COMPARISON:  None. FINDINGS: SI joints are intact. Pubic symphysis and rami show no fracture. No fracture or dislocation is evident. IMPRESSION: No acute osseous abnormality Electronically Signed   By: Donavan Foil M.D.   On: 09/26/2018 21:44   Dg Shoulder Right  Result Date: 09/26/2018 CLINICAL DATA:  Fall EXAM: RIGHT SHOULDER - 2+ VIEW COMPARISON:  None. FINDINGS: Acute mildly comminuted fracture at the greater tuberosity of the humerus. Suspected faint transverse lucency through the neck of the humerus. No dislocation. Mild AC joint degenerative change. Architectural distortion at the right hilus IMPRESSION: 1. Acute comminuted fracture involving the greater tuberosity of the humerus with suspected transverse fracture lucency at the right humeral neck. Electronically Signed   By: Donavan Foil M.D.   On: 09/26/2018 21:43   Dg Elbow Complete Right  Result Date: 09/26/2018 CLINICAL DATA:  Fall EXAM: RIGHT ELBOW - COMPLETE 3+ VIEW COMPARISON:  None. FINDINGS: Acute, mildly comminuted fracture involving the distal humerus above the level of the condyles. About 1/3 bone with ulnar displacement of distal fracture fragment. Associated large elbow effusion. Radial head alignment is within normal limits. IMPRESSION: Acute, mildly comminuted and displaced distal  humerus fracture with associated elbow effusion Electronically Signed   By: Donavan Foil M.D.   On: 09/26/2018 21:48   Dg Forearm Right  Result Date: 09/26/2018 CLINICAL DATA:  Fall EXAM: RIGHT FOREARM - 2 VIEW COMPARISON:  None. FINDINGS: Acute minimally displaced fracture at the ulnar styloid process. Acute impacted intra-articular distal radius fracture with mild dorsal displacement. Incompletely visualized distal humerus fracture. IMPRESSION: 1. Acute impacted intra-articular distal radius fracture. Acute ulnar styloid process fracture 2. Incompletely visualized displaced distal humerus fracture Electronically Signed   By: Donavan Foil M.D.   On: 09/26/2018 21:47   Ct Head Wo Contrast  Result Date: 09/26/2018 CLINICAL DATA:  Initial evaluation for acute trauma, fall. EXAM: CT HEAD WITHOUT CONTRAST CT MAXILLOFACIAL WITHOUT CONTRAST CT CERVICAL SPINE WITHOUT CONTRAST TECHNIQUE: Multidetector CT imaging of the head, cervical spine, and maxillofacial structures were performed using the standard protocol without intravenous contrast. Multiplanar CT image reconstructions of the cervical spine and maxillofacial structures were also generated. COMPARISON:  None. FINDINGS: CT HEAD FINDINGS Brain: Generalized age-related cerebral atrophy with mild chronic small vessel ischemic disease. Acute subdural hematoma overlies the right cerebral convexity, measuring up to 6 mm in maximal thickness. Additional smaller subdural hemorrhage seen overlying the left parietal convexity, measuring up to 5 mm in maximal diameter. Associated trace 1-2 mm right-to-left shift. No hydrocephalus or ventricular trapping. No other acute intracranial hemorrhage. No acute large vessel territory infarct. No mass lesion. Vascular: No hyperdense vessel. Scattered vascular calcifications noted within the carotid siphons. Skull: Soft tissue contusion at the right periorbital region. Calvarium intact. Other: Trace left mastoid effusion noted,  of doubtful significance. CT MAXILLOFACIAL FINDINGS Osseous: Summer zygomatic arches are intact. No acute maxillary fracture. Pterygoid plates intact. No acute nasal bone fracture. Mild left-to-right nasal septal deviation without associated fracture. Mandible intact. Mandibular condyles normally situated. No acute abnormality about the dentition. Orbits: Globes intact. Normal retro-orbital hematoma or other pathology. Bony orbits intact without fracture. Sinuses: Paranasal sinuses are clear.  No hemosinus. Soft tissues: Large right periorbital/facial contusion. CT CERVICAL SPINE FINDINGS Alignment: Straightening of the normal cervical lordosis. No listhesis. Skull base and vertebrae: Skull base intact. Normal C1-2 articulations are preserved in the dens is intact. Vertebral body heights maintained. No acute fracture. Soft tissues and spinal canal: Soft tissues of the neck demonstrate no acute finding. No abnormal prevertebral edema. Spinal canal within normal limits. Disc levels: Mild right-sided facet arthrosis at C2-3 and C3-4. No significant spinal stenosis within the cervical spine. Upper chest: Visualized upper chest demonstrates no acute finding. Upper lobe predominant centrilobular emphysema noted. Other: None. IMPRESSION: CT HEAD: 1. Acute bilateral subdural hematomas, right larger than left as above. Associated trace 1-2 mm right-to-left midline shift. 2. No other acute intracranial abnormality. 3. Mild age-related cerebral atrophy with chronic small vessel ischemic disease. CT MAXILLOFACIAL: 1. Large right periorbital/facial soft tissue contusion. 2. No other acute maxillofacial injury. No fracture. Intact globes with no retro-orbital pathology. CT CERVICAL SPINE: No acute traumatic injury within the cervical spine. Critical Value/emergent results were called by telephone at the time of interpretation on 09/26/2018 at 10:50 pm to Dr. Shelby Dubin , who verbally acknowledged these results. Electronically  Signed   By: Jeannine Boga M.D.   On: 09/26/2018 22:52   Ct Cervical Spine Wo Contrast  Result Date: 09/26/2018 CLINICAL DATA:  Initial evaluation for acute trauma, fall. EXAM: CT HEAD WITHOUT CONTRAST CT MAXILLOFACIAL WITHOUT CONTRAST CT CERVICAL SPINE WITHOUT CONTRAST TECHNIQUE: Multidetector CT imaging of the head, cervical spine, and maxillofacial structures were performed using the standard protocol without intravenous contrast. Multiplanar CT image reconstructions of the cervical spine and maxillofacial structures were also generated. COMPARISON:  None. FINDINGS: CT  HEAD FINDINGS Brain: Generalized age-related cerebral atrophy with mild chronic small vessel ischemic disease. Acute subdural hematoma overlies the right cerebral convexity, measuring up to 6 mm in maximal thickness. Additional smaller subdural hemorrhage seen overlying the left parietal convexity, measuring up to 5 mm in maximal diameter. Associated trace 1-2 mm right-to-left shift. No hydrocephalus or ventricular trapping. No other acute intracranial hemorrhage. No acute large vessel territory infarct. No mass lesion. Vascular: No hyperdense vessel. Scattered vascular calcifications noted within the carotid siphons. Skull: Soft tissue contusion at the right periorbital region. Calvarium intact. Other: Trace left mastoid effusion noted, of doubtful significance. CT MAXILLOFACIAL FINDINGS Osseous: Summer zygomatic arches are intact. No acute maxillary fracture. Pterygoid plates intact. No acute nasal bone fracture. Mild left-to-right nasal septal deviation without associated fracture. Mandible intact. Mandibular condyles normally situated. No acute abnormality about the dentition. Orbits: Globes intact. Normal retro-orbital hematoma or other pathology. Bony orbits intact without fracture. Sinuses: Paranasal sinuses are clear.  No hemosinus. Soft tissues: Large right periorbital/facial contusion. CT CERVICAL SPINE FINDINGS Alignment:  Straightening of the normal cervical lordosis. No listhesis. Skull base and vertebrae: Skull base intact. Normal C1-2 articulations are preserved in the dens is intact. Vertebral body heights maintained. No acute fracture. Soft tissues and spinal canal: Soft tissues of the neck demonstrate no acute finding. No abnormal prevertebral edema. Spinal canal within normal limits. Disc levels: Mild right-sided facet arthrosis at C2-3 and C3-4. No significant spinal stenosis within the cervical spine. Upper chest: Visualized upper chest demonstrates no acute finding. Upper lobe predominant centrilobular emphysema noted. Other: None. IMPRESSION: CT HEAD: 1. Acute bilateral subdural hematomas, right larger than left as above. Associated trace 1-2 mm right-to-left midline shift. 2. No other acute intracranial abnormality. 3. Mild age-related cerebral atrophy with chronic small vessel ischemic disease. CT MAXILLOFACIAL: 1. Large right periorbital/facial soft tissue contusion. 2. No other acute maxillofacial injury. No fracture. Intact globes with no retro-orbital pathology. CT CERVICAL SPINE: No acute traumatic injury within the cervical spine. Critical Value/emergent results were called by telephone at the time of interpretation on 09/26/2018 at 10:50 pm to Dr. Shelby Dubin , who verbally acknowledged these results. Electronically Signed   By: Jeannine Boga M.D.   On: 09/26/2018 22:52   Dg Hand Complete Right  Result Date: 09/26/2018 CLINICAL DATA:  Fall EXAM: RIGHT HAND - COMPLETE 3+ VIEW COMPARISON:  None. FINDINGS: Bones appear osteopenic. Mild arthritis at the first [facility] joint. Degenerative changes at the DIP and PIP joints. Acute nondisplaced fracture ulnar styloid process. Acute, mildly comminuted and impacted intra-articular fracture at the distal radius. IMPRESSION: 1. Acute comminuted impacted intra-articular fracture of the distal radius 2. Acute nondisplaced ulnar styloid process fracture Electronically  Signed   By: Donavan Foil M.D.   On: 09/26/2018 21:46   Ct Maxillofacial Wo Contrast  Result Date: 09/26/2018 CLINICAL DATA:  Initial evaluation for acute trauma, fall. EXAM: CT HEAD WITHOUT CONTRAST CT MAXILLOFACIAL WITHOUT CONTRAST CT CERVICAL SPINE WITHOUT CONTRAST TECHNIQUE: Multidetector CT imaging of the head, cervical spine, and maxillofacial structures were performed using the standard protocol without intravenous contrast. Multiplanar CT image reconstructions of the cervical spine and maxillofacial structures were also generated. COMPARISON:  None. FINDINGS: CT HEAD FINDINGS Brain: Generalized age-related cerebral atrophy with mild chronic small vessel ischemic disease. Acute subdural hematoma overlies the right cerebral convexity, measuring up to 6 mm in maximal thickness. Additional smaller subdural hemorrhage seen overlying the left parietal convexity, measuring up to 5 mm in maximal diameter. Associated trace 1-2  mm right-to-left shift. No hydrocephalus or ventricular trapping. No other acute intracranial hemorrhage. No acute large vessel territory infarct. No mass lesion. Vascular: No hyperdense vessel. Scattered vascular calcifications noted within the carotid siphons. Skull: Soft tissue contusion at the right periorbital region. Calvarium intact. Other: Trace left mastoid effusion noted, of doubtful significance. CT MAXILLOFACIAL FINDINGS Osseous: Summer zygomatic arches are intact. No acute maxillary fracture. Pterygoid plates intact. No acute nasal bone fracture. Mild left-to-right nasal septal deviation without associated fracture. Mandible intact. Mandibular condyles normally situated. No acute abnormality about the dentition. Orbits: Globes intact. Normal retro-orbital hematoma or other pathology. Bony orbits intact without fracture. Sinuses: Paranasal sinuses are clear.  No hemosinus. Soft tissues: Large right periorbital/facial contusion. CT CERVICAL SPINE FINDINGS Alignment: Straightening of  the normal cervical lordosis. No listhesis. Skull base and vertebrae: Skull base intact. Normal C1-2 articulations are preserved in the dens is intact. Vertebral body heights maintained. No acute fracture. Soft tissues and spinal canal: Soft tissues of the neck demonstrate no acute finding. No abnormal prevertebral edema. Spinal canal within normal limits. Disc levels: Mild right-sided facet arthrosis at C2-3 and C3-4. No significant spinal stenosis within the cervical spine. Upper chest: Visualized upper chest demonstrates no acute finding. Upper lobe predominant centrilobular emphysema noted. Other: None. IMPRESSION: CT HEAD: 1. Acute bilateral subdural hematomas, right larger than left as above. Associated trace 1-2 mm right-to-left midline shift. 2. No other acute intracranial abnormality. 3. Mild age-related cerebral atrophy with chronic small vessel ischemic disease. CT MAXILLOFACIAL: 1. Large right periorbital/facial soft tissue contusion. 2. No other acute maxillofacial injury. No fracture. Intact globes with no retro-orbital pathology. CT CERVICAL SPINE: No acute traumatic injury within the cervical spine. Critical Value/emergent results were called by telephone at the time of interpretation on 09/26/2018 at 10:50 pm to Dr. Shelby Dubin , who verbally acknowledged these results. Electronically Signed   By: Jeannine Boga M.D.   On: 09/26/2018 22:52    Review of Systems  Constitutional: Negative for weight loss.  HENT: Negative for ear discharge, ear pain, hearing loss and tinnitus.   Eyes: Negative for blurred vision, double vision, photophobia and pain.  Respiratory: Negative for cough, sputum production and shortness of breath.   Cardiovascular: Negative for chest pain.  Gastrointestinal: Negative for abdominal pain, nausea and vomiting.  Genitourinary: Negative for dysuria, flank pain, frequency and urgency.  Musculoskeletal: Positive for joint pain (RUE). Negative for back pain,  falls, myalgias and neck pain.  Neurological: Negative for dizziness, tingling, sensory change, focal weakness, loss of consciousness and headaches.  Endo/Heme/Allergies: Does not bruise/bleed easily.  Psychiatric/Behavioral: Negative for depression, memory loss and substance abuse. The patient is not nervous/anxious.    Blood pressure 111/61, pulse 99, temperature 99.2 F (37.3 C), resp. rate 20, height 5\' 4"  (1.626 m), weight 76.2 kg, SpO2 98 %. Physical Exam  Constitutional: She appears well-developed and well-nourished. No distress.  HENT:  Head: Normocephalic.  Eyes: Conjunctivae are normal. Right eye exhibits no discharge. Left eye exhibits no discharge. No scleral icterus.  Neck: Normal range of motion.  Cardiovascular: Normal rate and regular rhythm.  Respiratory: Effort normal. No respiratory distress.  Musculoskeletal:     Comments: Right shoulder, elbow, wrist, digits- no skin wounds, TTP shoulder, elbow, wrist, mild-mod, unsplinted  Sens  Ax/R/M/U intact  Mot   Ax/ R/ PIN/ M/ AIN/ U intact  Rad 2+  Neurological: She is alert.  Skin: Skin is warm and dry. She is not diaphoretic.  Psychiatric: She has Summer  normal mood and affect. Her behavior is normal.    Assessment/Plan: Right prox humerus fx -- Should do well non-operatively. Sling and gentle motion ok. NWB. Right distal humerus fx -- Plan for ORIF, tentatively scheduled for Thursday with Dr. Percell Miller. Will place in splint. NWB. Right wrist fx -- Plan non-operative management. Will splint. NWB. SDH Multiple medical problems including COPD, hyperlipidemia, breast cancer, lung cancer, and thrombocytopenia -- per primary service    Lisette Abu, PA-C Orthopedic Surgery [phone] 09/27/2018, 9:52 AM

## 2018-09-27 NOTE — H&P (Signed)
History and Physical    DIANA DAVENPORT PPJ:[id number] DOB: 1946-05-23 DOA: 09/26/2018  PCP: Elby Showers, MD  Patient coming from: Home.  Chief Complaint: Fall.  HPI: Summer Williams is a 73 y.o. female with history of COPD, hyperlipidemia, breast cancer, lung cancer, thrombocytopenia had a fall at home after she tripped.  She hit her head but did not lose consciousness.  Denies any chest pain or shortness of breath.  She also hurt her right upper extremity.  Patient called her son and EMS was called and was brought to the ER.  ED Course: In the ER patient had CT head CT maxillofacial and CT cervical spine.  Showed bilateral subdural hematoma with mild shift.  Also showed right periorbital hematoma.  There was mild laceration on the right pupil area for which patient declined sutures. labs also showed anemia with severe thrombocytopenia.  EKG showed sinus tachycardia.  X-rays revealed right distal humerus and distal radius and ulnar styloid fracture.  On-call neurosurgeon Dr. Venetia Constable was consulted at this time requested admission for further observation and also to transfuse platelets because of the low platelets and bleed.  Patient only takes aspirin advised not on any other anticoagulation.  For the fractures of the right upper extremity Dr. Percell Miller was consulted.  Patient was wheezing but patient declined any treatment for her COPD.  Chest x-ray does show some masslike opacity which will need further study to make sure there is no recurrence of her lung cancer.  Review of Systems: As per HPI, rest all negative.   Past Medical History:  Diagnosis Date   Breast cancer ([facility]) 01/05/14   left breast   COPD (chronic obstructive pulmonary disease) (HCC)    Depression    Diverticulosis    History of alcohol abuse    last 1980   Hyperlipidemia    Internal hemorrhoids    Lung cancer ([place])    Oxygen deficiency    Personal history of radiation therapy 2015   Radiation  03/23/14-04/19/14   Left breast   Radiation 12/13/14-01/19/15   right upper/middle lobe of lung 70.2 Gy   Requires supplemental oxygen    at night USES 2 LITERS    Shortness of breath     WITH ACTIVITY   Skin cancer    Squamous   Vitamin D deficiency    Wears glasses     Past Surgical History:  Procedure Laterality Date   BREAST BIOPSY Left 01/15/2014   x2   BREAST LUMPECTOMY Left 2015   radiation   BREAST SURGERY  02/09/2014   Left luimpectomy   CHOLECYSTECTOMY  2003   COLONOSCOPY WITH PROPOFOL N/A 11/08/2014   Procedure: COLONOSCOPY WITH PROPOFOL;  Surgeon: Inda Castle, MD;  Location: [facility];  Service: Endoscopy;  Laterality: N/A;   DENTAL SURGERY     implants   EYE SURGERY Bilateral 2005   Cataract OD   VIDEO BRONCHOSCOPY WITH ENDOBRONCHIAL NAVIGATION N/A 11/10/2014   Procedure: VIDEO BRONCHOSCOPY WITH ENDOBRONCHIAL NAVIGATION;  Surgeon: Melrose Nakayama, MD;  Location: [facility];  Service: Thoracic;  Laterality: N/A;     reports that she quit smoking about 13 years ago. Her smoking use included cigarettes. She has a 50.00 pack-year smoking history. She has never used smokeless tobacco. She reports that she does not drink alcohol or use drugs.  No Known Allergies  Family History  Problem Relation Age of Onset   Heart disease Father    Lung cancer Maternal Grandfather  Leukemia Paternal Grandfather    Cholelithiasis Mother    Ovarian cancer Maternal Aunt    Breast cancer Maternal Aunt    Colon cancer Neg Hx    Colon polyps Neg Hx    Esophageal cancer Neg Hx    Kidney disease Neg Hx    Gallbladder disease Neg Hx    Stomach cancer Neg Hx    Rectal cancer Neg Hx     Prior to Admission medications   Medication Sig Start Date End Date Taking? Authorizing Provider  albuterol (PROVENTIL) (2.5 MG/3ML) 0.083% nebulizer solution Take 3 mLs (2.5 mg total) by nebulization every 6 (six) hours as needed for wheezing or shortness of breath.  05/09/16   Baird Lyons D, MD  anastrozole (ARIMIDEX) 1 MG tablet TAKE 1 TABLET BY MOUTH EVERY DAY 09/06/18   Nicholas Lose, MD  arformoterol (BROVANA) 15 MCG/2ML NEBU 1 neb every 8 hours if needed 06/25/17   Baird Lyons D, MD  aspirin 81 MG tablet Take 1 tablet (81 mg total) by mouth daily. 04/28/16   Nicholas Lose, MD  atorvastatin (LIPITOR) 40 MG tablet TAKE 1 TABLET BY MOUTH EVERY DAY 05/07/18   Elby Showers, MD  Cholecalciferol (VITAMIN D) 2000 UNITS CAPS Take 1 capsule by mouth daily.     [provider]  cholestyramine (QUESTRAN) 4 g packet TAKE 1 PACKET (4 G TOTAL) BY MOUTH 2 (TWO) TIMES DAILY. 01/05/18   Yetta Flock, MD  citalopram (CELEXA) 20 MG tablet  09/14/16   [provider]  loratadine (CLARITIN) 10 MG tablet Take 10 mg by mouth daily.    [provider]  magnesium oxide (MAG-OX) 400 MG tablet Take 400 mg by mouth daily.    [provider]  Multiple Vitamins-Minerals (MULTIVITAMIN GUMMIES ADULT PO) Take 2 tablets by mouth daily.     [provider]  OLANZapine (ZYPREXA) 7.5 MG tablet Take 7.5 mg by mouth daily. 10/01/14   [provider]  Omega-3 Fatty Acids (FISH OIL) 1200 MG CAPS Take 1 capsule by mouth daily.    [provider]  oxyCODONE-acetaminophen (PERCOCET/ROXICET) 5-325 MG tablet Take 1 tablet by mouth every 6 (six) hours as needed for severe pain. 08/02/18   Nicholas Lose, MD  Probiotic Product (PROBIOTIC DAILY PO) Take 2 tablets by mouth daily. Probiotic gummy    [provider]  umeclidinium-vilanterol (ANORO ELLIPTA) 62.5-25 MCG/INH AEPB Inhale 1 puff into the lungs daily. 03/08/18   Deneise Lever, MD    Physical Exam: Vitals:   09/26/18 2153 09/26/18 2255 09/26/18 2300 09/26/18 2330  BP:  109/68 108/61 98/81  Pulse: (!) 125 (!) 129 (!) 134 (!) 137  Resp: (!) 28 20 18  (!) 29  Temp:      TempSrc:      SpO2: 100% 99% 99% 98%  Weight:      Height:          Constitutional:  Moderately built and nourished. Vitals:   09/26/18 2153 09/26/18 2255 09/26/18 2300 09/26/18 2330  BP:  109/68 108/61 98/81  Pulse: (!) 125 (!) 129 (!) 134 (!) 137  Resp: (!) 28 20 18  (!) 29  Temp:      TempSrc:      SpO2: 100% 99% 99% 98%  Weight:      Height:       Eyes: Right periorbital hematoma difficult to open the right eye. ENMT: No discharge from the ears eyes nose and mouth. Neck: No mass felt.  No  neck rigidity. Respiratory: No rhonchi or crepitations. Cardiovascular: S1-S2 heard. Abdomen: Soft nontender bowel sounds present. Musculoskeletal: Pain on moving the right upper extremity. Skin: Right periorbital hematoma. Neurologic: Alert awake oriented to time place and person.  Moves all extremities. Psychiatric: Appears normal.  Normal affect.   Labs on Admission: I have personally reviewed following labs and imaging studies  CBC: Recent Labs  Lab 09/26/18 2049  WBC 9.8  NEUTROABS 5.9  HGB 9.7*  HCT 31.9*  MCV 90.6  PLT 38*   Basic Metabolic Panel: Recent Labs  Lab 09/26/18 2049  NA 140  K 4.0  CL 102  CO2 32  GLUCOSE 116*  BUN 14  CREATININE 0.66  CALCIUM 9.0   GFR: Estimated Creatinine Clearance: 61.7 mL/min (by C-G formula based on SCr of 0.66 mg/dL). Liver Function Tests: No results for input(s): AST, ALT, ALKPHOS, BILITOT, PROT, ALBUMIN in the last 168 hours. No results for input(s): LIPASE, AMYLASE in the last 168 hours. No results for input(s): AMMONIA in the last 168 hours. Coagulation Profile: Recent Labs  Lab 09/26/18 2347  INR 1.1   Cardiac Enzymes: No results for input(s): CKTOTAL, CKMB, CKMBINDEX, TROPONINI in the last 168 hours. BNP (last 3 results) No results for input(s): PROBNP in the last 8760 hours. HbA1C: No results for input(s): HGBA1C in the last 72 hours. CBG: No results for input(s): GLUCAP in the last 168 hours. Lipid Profile: No results for input(s): CHOL, HDL, LDLCALC, TRIG, CHOLHDL, LDLDIRECT in the last 72  hours. Thyroid Function Tests: No results for input(s): TSH, T4TOTAL, FREET4, T3FREE, THYROIDAB in the last 72 hours. Anemia Panel: No results for input(s): VITAMINB12, FOLATE, FERRITIN, TIBC, IRON, RETICCTPCT in the last 72 hours. Urine analysis:    Component Value Date/Time   BILIRUBINUR NEG 04/27/2018 1016   PROTEINUR Positive (A) 04/27/2018 1016   UROBILINOGEN 0.2 04/27/2018 1016   NITRITE NEG 04/27/2018 1016   LEUKOCYTESUR Negative 04/27/2018 1016   Sepsis Labs: @LABRCNTIP (procalcitonin:4,lacticidven:4) )No results found for this or any previous visit (from the past 240 hour(s)).   Radiological Exams on Admission: Dg Chest 2 View  Result Date: 09/26/2018 CLINICAL DATA:  Shortness of breath EXAM: CHEST - 2 VIEW COMPARISON:  09/03/2017, PET CT 11/24/2016 FINDINGS: Masslike opacity and architectural distortion in the right hilar region, slightly more opaque. Volume loss in the right hemithorax consistent with post treatment change. No pleural effusion. Stable cardiomediastinal silhouette with aortic atherosclerosis. No pneumothorax. Subtle fracture proximal right humerus. Age indeterminate right seventh rib fracture. IMPRESSION: 1. Negative for pneumothorax. 2. Masslike opacity with architectural distortion in the right hilar region, possibly due to post treatment change however this region appears slightly more opaque compared to prior and recurrent mass is possible. 3. Subtle fracture involving the proximal right humerus. 4. Age indeterminate right seventh rib fracture Electronically Signed   By: Donavan Foil M.D.   On: 09/26/2018 21:54   Dg Pelvis 1-2 Views  Result Date: 09/26/2018 CLINICAL DATA:  Fall EXAM: PELVIS - 1-2 VIEW COMPARISON:  None. FINDINGS: SI joints are intact. Pubic symphysis and rami show no fracture. No fracture or dislocation is evident. IMPRESSION: No acute osseous abnormality Electronically Signed   By: Donavan Foil M.D.   On: 09/26/2018 21:44   Dg Shoulder  Right  Result Date: 09/26/2018 CLINICAL DATA:  Fall EXAM: RIGHT SHOULDER - 2+ VIEW COMPARISON:  None. FINDINGS: Acute mildly comminuted fracture at the greater tuberosity of the humerus. Suspected faint transverse lucency through the neck of the humerus.  No dislocation. Mild AC joint degenerative change. Architectural distortion at the right hilus IMPRESSION: 1. Acute comminuted fracture involving the greater tuberosity of the humerus with suspected transverse fracture lucency at the right humeral neck. Electronically Signed   By: Donavan Foil M.D.   On: 09/26/2018 21:43   Dg Elbow Complete Right  Result Date: 09/26/2018 CLINICAL DATA:  Fall EXAM: RIGHT ELBOW - COMPLETE 3+ VIEW COMPARISON:  None. FINDINGS: Acute, mildly comminuted fracture involving the distal humerus above the level of the condyles. About 1/3 bone with ulnar displacement of distal fracture fragment. Associated large elbow effusion. Radial head alignment is within normal limits. IMPRESSION: Acute, mildly comminuted and displaced distal humerus fracture with associated elbow effusion Electronically Signed   By: Donavan Foil M.D.   On: 09/26/2018 21:48   Dg Forearm Right  Result Date: 09/26/2018 CLINICAL DATA:  Fall EXAM: RIGHT FOREARM - 2 VIEW COMPARISON:  None. FINDINGS: Acute minimally displaced fracture at the ulnar styloid process. Acute impacted intra-articular distal radius fracture with mild dorsal displacement. Incompletely visualized distal humerus fracture. IMPRESSION: 1. Acute impacted intra-articular distal radius fracture. Acute ulnar styloid process fracture 2. Incompletely visualized displaced distal humerus fracture Electronically Signed   By: Donavan Foil M.D.   On: 09/26/2018 21:47   Ct Head Wo Contrast  Result Date: 09/26/2018 CLINICAL DATA:  Initial evaluation for acute trauma, fall. EXAM: CT HEAD WITHOUT CONTRAST CT MAXILLOFACIAL WITHOUT CONTRAST CT CERVICAL SPINE WITHOUT CONTRAST TECHNIQUE: Multidetector CT  imaging of the head, cervical spine, and maxillofacial structures were performed using the standard protocol without intravenous contrast. Multiplanar CT image reconstructions of the cervical spine and maxillofacial structures were also generated. COMPARISON:  None. FINDINGS: CT HEAD FINDINGS Brain: Generalized age-related cerebral atrophy with mild chronic small vessel ischemic disease. Acute subdural hematoma overlies the right cerebral convexity, measuring up to 6 mm in maximal thickness. Additional smaller subdural hemorrhage seen overlying the left parietal convexity, measuring up to 5 mm in maximal diameter. Associated trace 1-2 mm right-to-left shift. No hydrocephalus or ventricular trapping. No other acute intracranial hemorrhage. No acute large vessel territory infarct. No mass lesion. Vascular: No hyperdense vessel. Scattered vascular calcifications noted within the carotid siphons. Skull: Soft tissue contusion at the right periorbital region. Calvarium intact. Other: Trace left mastoid effusion noted, of doubtful significance. CT MAXILLOFACIAL FINDINGS Osseous: A zygomatic arches are intact. No acute maxillary fracture. Pterygoid plates intact. No acute nasal bone fracture. Mild left-to-right nasal septal deviation without associated fracture. Mandible intact. Mandibular condyles normally situated. No acute abnormality about the dentition. Orbits: Globes intact. Normal retro-orbital hematoma or other pathology. Bony orbits intact without fracture. Sinuses: Paranasal sinuses are clear.  No hemosinus. Soft tissues: Large right periorbital/facial contusion. CT CERVICAL SPINE FINDINGS Alignment: Straightening of the normal cervical lordosis. No listhesis. Skull base and vertebrae: Skull base intact. Normal C1-2 articulations are preserved in the dens is intact. Vertebral body heights maintained. No acute fracture. Soft tissues and spinal canal: Soft tissues of the neck demonstrate no acute finding. No  abnormal prevertebral edema. Spinal canal within normal limits. Disc levels: Mild right-sided facet arthrosis at C2-3 and C3-4. No significant spinal stenosis within the cervical spine. Upper chest: Visualized upper chest demonstrates no acute finding. Upper lobe predominant centrilobular emphysema noted. Other: None. IMPRESSION: CT HEAD: 1. Acute bilateral subdural hematomas, right larger than left as above. Associated trace 1-2 mm right-to-left midline shift. 2. No other acute intracranial abnormality. 3. Mild age-related cerebral atrophy with chronic small vessel ischemic disease. CT  MAXILLOFACIAL: 1. Large right periorbital/facial soft tissue contusion. 2. No other acute maxillofacial injury. No fracture. Intact globes with no retro-orbital pathology. CT CERVICAL SPINE: No acute traumatic injury within the cervical spine. Critical Value/emergent results were called by telephone at the time of interpretation on 09/26/2018 at 10:50 pm to Dr. Shelby Dubin , who verbally acknowledged these results. Electronically Signed   By: Jeannine Boga M.D.   On: 09/26/2018 22:52   Ct Cervical Spine Wo Contrast  Result Date: 09/26/2018 CLINICAL DATA:  Initial evaluation for acute trauma, fall. EXAM: CT HEAD WITHOUT CONTRAST CT MAXILLOFACIAL WITHOUT CONTRAST CT CERVICAL SPINE WITHOUT CONTRAST TECHNIQUE: Multidetector CT imaging of the head, cervical spine, and maxillofacial structures were performed using the standard protocol without intravenous contrast. Multiplanar CT image reconstructions of the cervical spine and maxillofacial structures were also generated. COMPARISON:  None. FINDINGS: CT HEAD FINDINGS Brain: Generalized age-related cerebral atrophy with mild chronic small vessel ischemic disease. Acute subdural hematoma overlies the right cerebral convexity, measuring up to 6 mm in maximal thickness. Additional smaller subdural hemorrhage seen overlying the left parietal convexity, measuring up to 5 mm in  maximal diameter. Associated trace 1-2 mm right-to-left shift. No hydrocephalus or ventricular trapping. No other acute intracranial hemorrhage. No acute large vessel territory infarct. No mass lesion. Vascular: No hyperdense vessel. Scattered vascular calcifications noted within the carotid siphons. Skull: Soft tissue contusion at the right periorbital region. Calvarium intact. Other: Trace left mastoid effusion noted, of doubtful significance. CT MAXILLOFACIAL FINDINGS Osseous: A zygomatic arches are intact. No acute maxillary fracture. Pterygoid plates intact. No acute nasal bone fracture. Mild left-to-right nasal septal deviation without associated fracture. Mandible intact. Mandibular condyles normally situated. No acute abnormality about the dentition. Orbits: Globes intact. Normal retro-orbital hematoma or other pathology. Bony orbits intact without fracture. Sinuses: Paranasal sinuses are clear.  No hemosinus. Soft tissues: Large right periorbital/facial contusion. CT CERVICAL SPINE FINDINGS Alignment: Straightening of the normal cervical lordosis. No listhesis. Skull base and vertebrae: Skull base intact. Normal C1-2 articulations are preserved in the dens is intact. Vertebral body heights maintained. No acute fracture. Soft tissues and spinal canal: Soft tissues of the neck demonstrate no acute finding. No abnormal prevertebral edema. Spinal canal within normal limits. Disc levels: Mild right-sided facet arthrosis at C2-3 and C3-4. No significant spinal stenosis within the cervical spine. Upper chest: Visualized upper chest demonstrates no acute finding. Upper lobe predominant centrilobular emphysema noted. Other: None. IMPRESSION: CT HEAD: 1. Acute bilateral subdural hematomas, right larger than left as above. Associated trace 1-2 mm right-to-left midline shift. 2. No other acute intracranial abnormality. 3. Mild age-related cerebral atrophy with chronic small vessel ischemic disease. CT MAXILLOFACIAL: 1.  Large right periorbital/facial soft tissue contusion. 2. No other acute maxillofacial injury. No fracture. Intact globes with no retro-orbital pathology. CT CERVICAL SPINE: No acute traumatic injury within the cervical spine. Critical Value/emergent results were called by telephone at the time of interpretation on 09/26/2018 at 10:50 pm to Dr. Shelby Dubin , who verbally acknowledged these results. Electronically Signed   By: Jeannine Boga M.D.   On: 09/26/2018 22:52   Dg Hand Complete Right  Result Date: 09/26/2018 CLINICAL DATA:  Fall EXAM: RIGHT HAND - COMPLETE 3+ VIEW COMPARISON:  None. FINDINGS: Bones appear osteopenic. Mild arthritis at the first [facility] joint. Degenerative changes at the DIP and PIP joints. Acute nondisplaced fracture ulnar styloid process. Acute, mildly comminuted and impacted intra-articular fracture at the distal radius. IMPRESSION: 1. Acute comminuted impacted intra-articular fracture of  the distal radius 2. Acute nondisplaced ulnar styloid process fracture Electronically Signed   By: Donavan Foil M.D.   On: 09/26/2018 21:46   Ct Maxillofacial Wo Contrast  Result Date: 09/26/2018 CLINICAL DATA:  Initial evaluation for acute trauma, fall. EXAM: CT HEAD WITHOUT CONTRAST CT MAXILLOFACIAL WITHOUT CONTRAST CT CERVICAL SPINE WITHOUT CONTRAST TECHNIQUE: Multidetector CT imaging of the head, cervical spine, and maxillofacial structures were performed using the standard protocol without intravenous contrast. Multiplanar CT image reconstructions of the cervical spine and maxillofacial structures were also generated. COMPARISON:  None. FINDINGS: CT HEAD FINDINGS Brain: Generalized age-related cerebral atrophy with mild chronic small vessel ischemic disease. Acute subdural hematoma overlies the right cerebral convexity, measuring up to 6 mm in maximal thickness. Additional smaller subdural hemorrhage seen overlying the left parietal convexity, measuring up to 5 mm in maximal diameter.  Associated trace 1-2 mm right-to-left shift. No hydrocephalus or ventricular trapping. No other acute intracranial hemorrhage. No acute large vessel territory infarct. No mass lesion. Vascular: No hyperdense vessel. Scattered vascular calcifications noted within the carotid siphons. Skull: Soft tissue contusion at the right periorbital region. Calvarium intact. Other: Trace left mastoid effusion noted, of doubtful significance. CT MAXILLOFACIAL FINDINGS Osseous: A zygomatic arches are intact. No acute maxillary fracture. Pterygoid plates intact. No acute nasal bone fracture. Mild left-to-right nasal septal deviation without associated fracture. Mandible intact. Mandibular condyles normally situated. No acute abnormality about the dentition. Orbits: Globes intact. Normal retro-orbital hematoma or other pathology. Bony orbits intact without fracture. Sinuses: Paranasal sinuses are clear.  No hemosinus. Soft tissues: Large right periorbital/facial contusion. CT CERVICAL SPINE FINDINGS Alignment: Straightening of the normal cervical lordosis. No listhesis. Skull base and vertebrae: Skull base intact. Normal C1-2 articulations are preserved in the dens is intact. Vertebral body heights maintained. No acute fracture. Soft tissues and spinal canal: Soft tissues of the neck demonstrate no acute finding. No abnormal prevertebral edema. Spinal canal within normal limits. Disc levels: Mild right-sided facet arthrosis at C2-3 and C3-4. No significant spinal stenosis within the cervical spine. Upper chest: Visualized upper chest demonstrates no acute finding. Upper lobe predominant centrilobular emphysema noted. Other: None. IMPRESSION: CT HEAD: 1. Acute bilateral subdural hematomas, right larger than left as above. Associated trace 1-2 mm right-to-left midline shift. 2. No other acute intracranial abnormality. 3. Mild age-related cerebral atrophy with chronic small vessel ischemic disease. CT MAXILLOFACIAL: 1. Large right  periorbital/facial soft tissue contusion. 2. No other acute maxillofacial injury. No fracture. Intact globes with no retro-orbital pathology. CT CERVICAL SPINE: No acute traumatic injury within the cervical spine. Critical Value/emergent results were called by telephone at the time of interpretation on 09/26/2018 at 10:50 pm to Dr. Shelby Dubin , who verbally acknowledged these results. Electronically Signed   By: Jeannine Boga M.D.   On: 09/26/2018 22:52    EKG: Independently reviewed.  Sinus tachycardia.  Assessment/Plan Principal Problem:   Subdural hematoma (HCC) Active Problems:   Schizoaffective disorder (HCC)   COPD exacerbation (HCC)   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 ([place])   DNR (do not resuscitate)    1. Bilateral subdural hematoma with right periorbital hematoma -on-call neurosurgeon Dr. Venetia Constable has been consulted at this time patient will be placed on neurochecks and closely observed in progressive care unit.  Further recommendations per neurosurgery.  Hold aspirin.  Since patient has severe thrombocytopenia platelet transfusion has been ordered. 2. Right upper extremity distal humerus distal radius and ulnar styloid fracture for which Dr. Percell Miller on-call orthopedic surgeon  has been consulted for further recommendation. 3. Severe thrombocytopenia with history of chronic thrombocytopenia for which platelet transfusion has been ordered. 4. Normocytic normochromic anemia with worsening of hemoglobin probably from acute blood loss.  Follow CBC transfuse if hemoglobin less than 7. 5. COPD with wheezing -declines any inhalers or nebulizer. 6. History of breast cancer on anastrozole. 7. Previous history of lung cancer present chest x-ray shows possible masslike opacity for which patient will need further work-up. 8. Schizoaffective disorder on Zyprexa and citalopram.   DVT prophylaxis: SCDs due to patient having active bleed. Code Status: DNR. Family  Communication: Discussed with patient. Disposition Plan: To be determined. Consults called: Neurosurgery and orthopedics. Admission status: Inpatient.   Rise Patience MD [facility] Pager [phone].  If 7PM-7AM, please contact night-coverage [email or web address] Password [facility]  09/27/2018, 12:24 AM

## 2018-09-27 NOTE — Consult Note (Signed)
Neurosurgery Consultation  Reason for Consult: Subdural hematoma Referring Physician: Florene Glen  CC: Arm pain  HPI: This is a 73 y.o. woman that presents after a fall without LOC. She has a known history of thrombocytopenia due to malignancy. She had a large supraorbital lac and arm deformity, so she came to the ED via ambulance. Aside from the RUE fracture, she denies new weakness, no seizure-like or aura-like activity, no recent use of anti-platelet or anti-coagulant medications but PLT 38 on admission   ROS: A 14 point ROS was performed and is negative except as noted in the HPI.   PMHx:  Past Medical History:  Diagnosis Date  . Breast cancer ([place]) 01/05/14   left breast  . COPD (chronic obstructive pulmonary disease) ([place])   . Depression   . Diverticulosis   . History of alcohol abuse    last 1980  . Hyperlipidemia   . Internal hemorrhoids   . Lung cancer ([place])   . Oxygen deficiency   . Personal history of radiation therapy 2015  . Radiation 03/23/14-04/19/14   Left breast  . Radiation 12/13/14-01/19/15   right upper/middle lobe of lung 70.2 Gy  . Requires supplemental oxygen    at night USES 2 LITERS   . Shortness of breath     WITH ACTIVITY  . Skin cancer    Squamous  . Vitamin D deficiency   . Wears glasses    FamHx:  Family History  Problem Relation Age of Onset  . Heart disease Father   . Lung cancer Maternal Grandfather   . Leukemia Paternal Grandfather   . Cholelithiasis Mother   . Ovarian cancer Maternal Aunt   . Breast cancer Maternal Aunt   . Colon cancer Neg Hx   . Colon polyps Neg Hx   . Esophageal cancer Neg Hx   . Kidney disease Neg Hx   . Gallbladder disease Neg Hx   . Stomach cancer Neg Hx   . Rectal cancer Neg Hx    SocHx:  reports that she quit smoking about 13 years ago. Her smoking use included cigarettes. She has a 50.00 pack-year smoking history. She has never used smokeless tobacco. She reports that she does not drink alcohol or use  drugs.  Exam: Vital signs in last 24 hours: Temp:  [98.7 F (37.1 C)-99.5 F (37.5 C)] 99.2 F (37.3 C) (04/27 0744) Pulse Rate:  [99-137] 99 (04/27 0744) Resp:  [18-38] 20 (04/27 0744) BP: (97-127)/(60-81) 111/61 (04/27 0744) SpO2:  [98 %-100 %] 98 % (04/27 0744) Weight:  [76.2 kg] 76.2 kg (04/26 2012) General: Awake, alert, cooperative, lying in bed in NAD Head: normocephalic, +R supraorbital lac and ecchymosis HEENT: neck supple Pulmonary: breathing supplemental O2 with audible wheezing but pt able to speak in full sentences Psych: affect appears reactive and full, pt pleasant and conversant Abdomen: S NT ND Extremities: +RUE deformity with painful, reduced ROM Neuro: AOx3, PERRL, EOMI, FS Strength 5/5 x4 except RUE not tested due to pain, SILTx4, no drift on L   Assessment and Plan: 73 y.o. woman with thrombocytopenia s/p fall with RUE fracture and R supraorbital lac. [facility] personally reviewed, which shows thin bilateral acute subdural hematomas. Pt already s/p 1U PLT transfusion  -no acute neurosurgical intervention indicated at this time -given her thrombocytopenia, recommend holding ASA for 1 week -please call with any concerns or questions  Darleen Part, MD 09/27/18 9:30 AM [facility]

## 2018-09-27 NOTE — Progress Notes (Signed)
Orthopedic Tech Progress Note Patient Details:  Summer Williams 01-Apr-1946 [id number]  Ortho Devices Type of Ortho Device: Ace wrap, Sling immobilizer, Post (long arm) splint Ortho Device/Splint Interventions: Application   Post Interventions Patient Tolerated: Well Instructions Provided: Care of device   [place] Pink 09/27/2018, 11:35 AM

## 2018-09-27 NOTE — ED Notes (Signed)
Attempted to call report, will call back.

## 2018-09-28 ENCOUNTER — Telehealth: Payer: Self-pay | Admitting: Internal Medicine

## 2018-09-28 LAB — HEMOGLOBIN AND HEMATOCRIT, BLOOD
HCT: 26 % — ABNORMAL LOW (ref 36.0–46.0)
Hemoglobin: 7.8 g/dL — ABNORMAL LOW (ref 12.0–15.0)

## 2018-09-28 LAB — PREPARE PLATELET PHERESIS: Unit division: 0

## 2018-09-28 LAB — CBC
HCT: 22.6 % — ABNORMAL LOW (ref 36.0–46.0)
Hemoglobin: 7 g/dL — ABNORMAL LOW (ref 12.0–15.0)
MCH: 28 pg (ref 26.0–34.0)
MCHC: 31 g/dL (ref 30.0–36.0)
MCV: 90.4 fL (ref 80.0–100.0)
Platelets: 50 10*3/uL — ABNORMAL LOW (ref 150–400)
RBC: 2.5 MIL/uL — ABNORMAL LOW (ref 3.87–5.11)
RDW: 15.5 % (ref 11.5–15.5)
WBC: 13.6 10*3/uL — ABNORMAL HIGH (ref 4.0–10.5)
nRBC: 0 % (ref 0.0–0.2)

## 2018-09-28 LAB — BPAM PLATELET PHERESIS
Blood Product Expiration Date: 202004282359
ISSUE DATE / TIME: 202004270355
Unit Type and Rh: 6200

## 2018-09-28 LAB — MAGNESIUM: Magnesium: 2.1 mg/dL (ref 1.7–2.4)

## 2018-09-28 MED ORDER — OXYCODONE HCL 5 MG PO TABS
5.0000 mg | ORAL_TABLET | ORAL | Status: DC | PRN
  Administered 2018-09-28 – 2018-10-04 (×10): 5 mg via ORAL
  Filled 2018-09-28 (×10): qty 1

## 2018-09-28 MED ORDER — ACETAMINOPHEN 325 MG PO TABS
650.0000 mg | ORAL_TABLET | Freq: Four times a day (QID) | ORAL | Status: DC
  Administered 2018-09-28 – 2018-10-04 (×20): 650 mg via ORAL
  Filled 2018-09-28 (×21): qty 2

## 2018-09-28 NOTE — Progress Notes (Addendum)
PROGRESS NOTE    [name]  BZJ:[id number] DOB: 1945/07/29 DOA: 09/26/2018 PCP: Elby Showers, MD   Brief Narrative:  Summer Williams is Summer Williams 73 y.o. female with history of COPD, hyperlipidemia, breast cancer, lung cancer, thrombocytopenia had [name] fall at home after she tripped.  She hit her head but did not lose consciousness.  Denies any chest pain or shortness of breath.  She also hurt her right upper extremity.  Patient called her son and EMS was called and was brought to the ER.  ED Course: In the ER patient had CT head CT maxillofacial and CT cervical spine.  Showed bilateral subdural hematoma with mild shift.  Also showed right periorbital hematoma.  There was mild laceration on the right pupil area for which patient declined sutures. labs also showed anemia with severe thrombocytopenia.  EKG showed sinus tachycardia.  X-rays revealed right distal humerus and distal radius and ulnar styloid fracture.  On-call neurosurgeon Dr. Venetia Constable was consulted at this time requested admission for further observation and also to transfuse platelets because of the low platelets and bleed.  Patient only takes aspirin advised not on any other anticoagulation.  For the fractures of the right upper extremity Dr. Percell Miller was consulted.  Patient was wheezing but patient declined any treatment for her COPD.  Chest x-ray does show some masslike opacity which will need further study to make sure there is no recurrence of her lung cancer.   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 Bilateral subdural hematoma with right periorbital hematoma - 1. Neurosurgery c/s - s/p 1 unit platelets given thrombocytopenia.  No plans for surgery at this time.  Recommend holding ASA x 1 week.   2. Right upper extremity fractures    Comminuted Fracture Involving Greater Tuberosity of Humerous with suspected transverse fracture lucency at the R humeral neck   Distal Humerus Fracture   distal radius and ulnar styloid fracture  1. RCRI 0  1. Discussed with son and daughter in law, concerned regarding her COPD.  Would like to talk to orthopedics about the surgery.  Discussed increased risk with COPD, currently not wheezing and appears optimized from respiratory standpoint.  Will recommend pulm c/s tomorrow as well given COPD.   2. Right wrist fracture, non operative management 3. Right proximal humerus fracture planning for non op management 4. Right distal humerus fracture, planning for ORIF 5. Schedule APAP, prn oxy, morphine prn  3. Severe thrombocytopenia with history of chronic thrombocytopenia 1. S/p 1 unit platelets  4. Normocytic normochromic anemia with worsening of hemoglobin probably from acute blood loss.  1. Hb low, repeat stable, continue to monitor 2. Transfuse for less than 7  5. COPD with wheezing -declines any inhalers or nebulizer.  6. History of breast cancer on anastrozole.  7. Previous history of lung cancer - X ray with mass like opacity in R hilar region (possibly post treatment, but recurrent mass is possible) - needs further work up  8. Schizoaffective disorder on Zyprexa and citalopram.  DVT prophylaxis: SCD Code Status: dnr Family Communication: none at bedside Disposition Plan: pending   Consultants:   Ortho  neurosurgery  Procedures:   none  Antimicrobials:  Anti-infectives (From admission, onward)   None  Subjective: C/o pain to R arm  Objective: Vitals:   09/28/18 0037 09/28/18 0543 09/28/18 0740 09/28/18 1151  BP: 114/69 138/70 124/68 100/76  Pulse: (!) 101 97 (!) 108 (!) 117  Resp: 18 18 (!) 22 (!) 22  Temp: 98.1 F (36.7 C) 98 F (36.7 C) 98.1 F (36.7 C) 98.8 F (37.1 C)  TempSrc: Oral Oral  Oral  SpO2: 100% 100% 100% 99%  Weight:        Height:       No intake or output data in the 24 hours ending 09/28/18 1606 Filed Weights   09/26/18 2012  Weight: 76.2 kg    Examination:  General: No acute distress. HEENT: bruising to R eye Cardiovascular: Heart sounds show Keilah Lemire regular rate, and rhythm. Lungs: Clear to auscultation bilaterally  Abdomen: Soft, nontender, nondistended Neurological: Alert and oriented 3. Moves all extremities 4. Cranial nerves II through XII grossly intact. Skin: Warm and dry. No rashes or lesions. Extremities: R arm in sling Psychiatric: Mood and affect are normal. Insight and judgment are appropraite.     Data Reviewed: I have personally reviewed following labs and imaging studies  CBC: Recent Labs  Lab 09/26/18 2049 09/27/18 0736 09/28/18 0314 09/28/18 0813  WBC 9.8 11.0* 13.6*  --   NEUTROABS 5.9 9.1*  --   --   HGB 9.7* 8.0* 7.0* 7.8*  HCT 31.9* 25.5* 22.6* 26.0*  MCV 90.6 88.2 90.4  --   PLT 38* 63* 50*  --    Basic Metabolic Panel: Recent Labs  Lab 09/26/18 2049 09/27/18 0736 09/27/18 1916 09/28/18 0314  NA 140 143 143  --   K 4.0 3.7 3.9  --   CL 102 105 106  --   CO2 32 30 29  --   GLUCOSE 116* 199* 156*  --   BUN 14 12 9   --   CREATININE 0.66 0.70 0.57  --   CALCIUM 9.0 8.7* 8.9  --   MG  --   --   --  2.1   GFR: Estimated Creatinine Clearance: 61.7 mL/min (by C-G formula based on SCr of 0.57 mg/dL). Liver Function Tests: Recent Labs  Lab 09/27/18 0736 09/27/18 1916  AST 37 24  ALT 22 20  ALKPHOS 65 59  BILITOT 0.8 0.6  PROT 5.9* 5.7*  ALBUMIN 3.5 3.5   No results for input(s): LIPASE, AMYLASE in the last 168 hours. No results for input(s): AMMONIA in the last 168 hours. Coagulation Profile: Recent Labs  Lab 09/26/18 2347  INR 1.1   Cardiac Enzymes: No results for input(s): CKTOTAL, CKMB, CKMBINDEX, TROPONINI in the last 168 hours. BNP (last 3 results) No results for input(s): PROBNP in the last 8760 hours. HbA1C: No results for input(s):  HGBA1C in the last 72 hours. CBG: No results for input(s): GLUCAP in the last 168 hours. Lipid Profile: No results for input(s): CHOL, HDL, LDLCALC, TRIG, CHOLHDL, LDLDIRECT in the last 72 hours. Thyroid Function Tests: No results for input(s): TSH, T4TOTAL, FREET4, T3FREE, THYROIDAB in the last 72 hours. Anemia Panel: Recent Labs    09/27/18 0736  VITAMINB12 330  FOLATE 18.9  FERRITIN 12  TIBC 371  IRON 31  RETICCTPCT 1.4   Sepsis Labs: No results for input(s): PROCALCITON, LATICACIDVEN in the last 168 hours.  No results found for this or any previous visit (from the past 240 hour(s)).       Radiology Studies: Dg Chest 2 View  Result Date: 09/26/2018 CLINICAL DATA:  Shortness of breath EXAM: CHEST - 2 VIEW COMPARISON:  09/03/2017, PET CT 11/24/2016 FINDINGS: Masslike opacity and architectural distortion in the right hilar region, slightly more opaque. Volume loss in the right hemithorax consistent with post treatment change. No pleural effusion. Stable cardiomediastinal silhouette with aortic atherosclerosis. No pneumothorax. Subtle fracture proximal right humerus. Age indeterminate right seventh rib fracture. IMPRESSION: 1. Negative for pneumothorax. 2. Masslike opacity with architectural distortion in the right hilar region, possibly due to post treatment change however this region appears slightly more opaque compared to prior and recurrent mass is possible. 3. Subtle fracture involving the proximal right humerus. 4. Age indeterminate right seventh rib fracture Electronically Signed   By: Donavan Foil M.D.   On: 09/26/2018 21:54   Dg Pelvis 1-2 Views  Result Date: 09/26/2018 CLINICAL DATA:  Fall EXAM: PELVIS - 1-2 VIEW COMPARISON:  None. FINDINGS: SI joints are intact. Pubic symphysis and rami show no fracture. No fracture or dislocation is evident. IMPRESSION: No acute osseous abnormality Electronically Signed   By: Donavan Foil M.D.   On: 09/26/2018 21:44   Dg Shoulder  Right  Result Date: 09/26/2018 CLINICAL DATA:  Fall EXAM: RIGHT SHOULDER - 2+ VIEW COMPARISON:  None. FINDINGS: Acute mildly comminuted fracture at the greater tuberosity of the humerus. Suspected faint transverse lucency through the neck of the humerus. No dislocation. Mild AC joint degenerative change. Architectural distortion at the right hilus IMPRESSION: 1. Acute comminuted fracture involving the greater tuberosity of the humerus with suspected transverse fracture lucency at the right humeral neck. Electronically Signed   By: Donavan Foil M.D.   On: 09/26/2018 21:43   Dg Elbow Complete Right  Result Date: 09/26/2018 CLINICAL DATA:  Fall EXAM: RIGHT ELBOW - COMPLETE 3+ VIEW COMPARISON:  None. FINDINGS: Acute, mildly comminuted fracture involving the distal humerus above the level of the condyles. About 1/3 bone with ulnar displacement of distal fracture fragment. Associated large elbow effusion. Radial head alignment is within normal limits. IMPRESSION: Acute, mildly comminuted and displaced distal humerus fracture with associated elbow effusion Electronically Signed   By: Donavan Foil M.D.   On: 09/26/2018 21:48   Dg Forearm Right  Result Date: 09/26/2018 CLINICAL DATA:  Fall EXAM: RIGHT FOREARM - 2 VIEW COMPARISON:  None. FINDINGS: Acute minimally displaced fracture at the ulnar styloid process. Acute impacted intra-articular distal radius fracture with mild dorsal displacement. Incompletely visualized distal humerus fracture. IMPRESSION: 1. Acute impacted intra-articular distal radius fracture. Acute ulnar styloid process fracture 2. Incompletely visualized displaced distal humerus fracture Electronically Signed   By: Donavan Foil M.D.   On: 09/26/2018 21:47   Ct Head Wo Contrast  Result Date: 09/26/2018 CLINICAL DATA:  Initial evaluation for acute trauma, fall. EXAM: CT HEAD WITHOUT CONTRAST CT MAXILLOFACIAL WITHOUT CONTRAST CT CERVICAL SPINE WITHOUT CONTRAST TECHNIQUE: Multidetector CT  imaging of the head, cervical spine, and maxillofacial structures were performed using the standard protocol without intravenous contrast. Multiplanar CT image reconstructions of the cervical spine and maxillofacial structures were also generated. COMPARISON:  None. FINDINGS: CT HEAD FINDINGS Brain: Generalized age-related cerebral atrophy with mild chronic small vessel ischemic disease. Acute subdural hematoma overlies the right cerebral convexity, measuring up to 6 mm in maximal thickness. Additional smaller subdural hemorrhage seen overlying the left parietal convexity, measuring up to 5 mm in maximal diameter. Associated trace 1-2 mm right-to-left shift. No hydrocephalus or ventricular trapping. No other acute intracranial hemorrhage. No acute large vessel territory infarct. No mass lesion. Vascular: No hyperdense vessel. Scattered  vascular calcifications noted within the carotid siphons. Skull: Soft tissue contusion at the right periorbital region. Calvarium intact. Other: Trace left mastoid effusion noted, of doubtful significance. CT MAXILLOFACIAL FINDINGS Osseous: Deontrey Massi zygomatic arches are intact. No acute maxillary fracture. Pterygoid plates intact. No acute nasal bone fracture. Mild left-to-right nasal septal deviation without associated fracture. Mandible intact. Mandibular condyles normally situated. No acute abnormality about the dentition. Orbits: Globes intact. Normal retro-orbital hematoma or other pathology. Bony orbits intact without fracture. Sinuses: Paranasal sinuses are clear.  No hemosinus. Soft tissues: Large right periorbital/facial contusion. CT CERVICAL SPINE FINDINGS Alignment: Straightening of the normal cervical lordosis. No listhesis. Skull base and vertebrae: Skull base intact. Normal C1-2 articulations are preserved in the dens is intact. Vertebral body heights maintained. No acute fracture. Soft tissues and spinal canal: Soft tissues of the neck demonstrate no acute finding. No  abnormal prevertebral edema. Spinal canal within normal limits. Disc levels: Mild right-sided facet arthrosis at C2-3 and C3-4. No significant spinal stenosis within the cervical spine. Upper chest: Visualized upper chest demonstrates no acute finding. Upper lobe predominant centrilobular emphysema noted. Other: None. IMPRESSION: CT HEAD: 1. Acute bilateral subdural hematomas, right larger than left as above. Associated trace 1-2 mm right-to-left midline shift. 2. No other acute intracranial abnormality. 3. Mild age-related cerebral atrophy with chronic small vessel ischemic disease. CT MAXILLOFACIAL: 1. Large right periorbital/facial soft tissue contusion. 2. No other acute maxillofacial injury. No fracture. Intact globes with no retro-orbital pathology. CT CERVICAL SPINE: No acute traumatic injury within the cervical spine. Critical Value/emergent results were called by telephone at the time of interpretation on 09/26/2018 at 10:50 pm to Dr. Shelby Dubin , who verbally acknowledged these results. Electronically Signed   By: Jeannine Boga M.D.   On: 09/26/2018 22:52   Ct Cervical Spine Wo Contrast  Result Date: 09/26/2018 CLINICAL DATA:  Initial evaluation for acute trauma, fall. EXAM: CT HEAD WITHOUT CONTRAST CT MAXILLOFACIAL WITHOUT CONTRAST CT CERVICAL SPINE WITHOUT CONTRAST TECHNIQUE: Multidetector CT imaging of the head, cervical spine, and maxillofacial structures were performed using the standard protocol without intravenous contrast. Multiplanar CT image reconstructions of the cervical spine and maxillofacial structures were also generated. COMPARISON:  None. FINDINGS: CT HEAD FINDINGS Brain: Generalized age-related cerebral atrophy with mild chronic small vessel ischemic disease. Acute subdural hematoma overlies the right cerebral convexity, measuring up to 6 mm in maximal thickness. Additional smaller subdural hemorrhage seen overlying the left parietal convexity, measuring up to 5 mm in  maximal diameter. Associated trace 1-2 mm right-to-left shift. No hydrocephalus or ventricular trapping. No other acute intracranial hemorrhage. No acute large vessel territory infarct. No mass lesion. Vascular: No hyperdense vessel. Scattered vascular calcifications noted within the carotid siphons. Skull: Soft tissue contusion at the right periorbital region. Calvarium intact. Other: Trace left mastoid effusion noted, of doubtful significance. CT MAXILLOFACIAL FINDINGS Osseous: Deshone Lyssy zygomatic arches are intact. No acute maxillary fracture. Pterygoid plates intact. No acute nasal bone fracture. Mild left-to-right nasal septal deviation without associated fracture. Mandible intact. Mandibular condyles normally situated. No acute abnormality about the dentition. Orbits: Globes intact. Normal retro-orbital hematoma or other pathology. Bony orbits intact without fracture. Sinuses: Paranasal sinuses are clear.  No hemosinus. Soft tissues: Large right periorbital/facial contusion. CT CERVICAL SPINE FINDINGS Alignment: Straightening of the normal cervical lordosis. No listhesis. Skull base and vertebrae: Skull base intact. Normal C1-2 articulations are preserved in the dens is intact. Vertebral body heights maintained. No acute fracture. Soft tissues and spinal canal: Soft tissues of the  neck demonstrate no acute finding. No abnormal prevertebral edema. Spinal canal within normal limits. Disc levels: Mild right-sided facet arthrosis at C2-3 and C3-4. No significant spinal stenosis within the cervical spine. Upper chest: Visualized upper chest demonstrates no acute finding. Upper lobe predominant centrilobular emphysema noted. Other: None. IMPRESSION: CT HEAD: 1. Acute bilateral subdural hematomas, right larger than left as above. Associated trace 1-2 mm right-to-left midline shift. 2. No other acute intracranial abnormality. 3. Mild age-related cerebral atrophy with chronic small vessel ischemic disease. CT MAXILLOFACIAL: 1.  Large right periorbital/facial soft tissue contusion. 2. No other acute maxillofacial injury. No fracture. Intact globes with no retro-orbital pathology. CT CERVICAL SPINE: No acute traumatic injury within the cervical spine. Critical Value/emergent results were called by telephone at the time of interpretation on 09/26/2018 at 10:50 pm to Dr. Shelby Dubin , who verbally acknowledged these results. Electronically Signed   By: Jeannine Boga M.D.   On: 09/26/2018 22:52   Dg Hand Complete Right  Result Date: 09/26/2018 CLINICAL DATA:  Fall EXAM: RIGHT HAND - COMPLETE 3+ VIEW COMPARISON:  None. FINDINGS: Bones appear osteopenic. Mild arthritis at the first [facility] joint. Degenerative changes at the DIP and PIP joints. Acute nondisplaced fracture ulnar styloid process. Acute, mildly comminuted and impacted intra-articular fracture at the distal radius. IMPRESSION: 1. Acute comminuted impacted intra-articular fracture of the distal radius 2. Acute nondisplaced ulnar styloid process fracture Electronically Signed   By: Donavan Foil M.D.   On: 09/26/2018 21:46   Ct Maxillofacial Wo Contrast  Result Date: 09/26/2018 CLINICAL DATA:  Initial evaluation for acute trauma, fall. EXAM: CT HEAD WITHOUT CONTRAST CT MAXILLOFACIAL WITHOUT CONTRAST CT CERVICAL SPINE WITHOUT CONTRAST TECHNIQUE: Multidetector CT imaging of the head, cervical spine, and maxillofacial structures were performed using the standard protocol without intravenous contrast. Multiplanar CT image reconstructions of the cervical spine and maxillofacial structures were also generated. COMPARISON:  None. FINDINGS: CT HEAD FINDINGS Brain: Generalized age-related cerebral atrophy with mild chronic small vessel ischemic disease. Acute subdural hematoma overlies the right cerebral convexity, measuring up to 6 mm in maximal thickness. Additional smaller subdural hemorrhage seen overlying the left parietal convexity, measuring up to 5 mm in maximal diameter.  Associated trace 1-2 mm right-to-left shift. No hydrocephalus or ventricular trapping. No other acute intracranial hemorrhage. No acute large vessel territory infarct. No mass lesion. Vascular: No hyperdense vessel. Scattered vascular calcifications noted within the carotid siphons. Skull: Soft tissue contusion at the right periorbital region. Calvarium intact. Other: Trace left mastoid effusion noted, of doubtful significance. CT MAXILLOFACIAL FINDINGS Osseous: Jere Vanburen zygomatic arches are intact. No acute maxillary fracture. Pterygoid plates intact. No acute nasal bone fracture. Mild left-to-right nasal septal deviation without associated fracture. Mandible intact. Mandibular condyles normally situated. No acute abnormality about the dentition. Orbits: Globes intact. Normal retro-orbital hematoma or other pathology. Bony orbits intact without fracture. Sinuses: Paranasal sinuses are clear.  No hemosinus. Soft tissues: Large right periorbital/facial contusion. CT CERVICAL SPINE FINDINGS Alignment: Straightening of the normal cervical lordosis. No listhesis. Skull base and vertebrae: Skull base intact. Normal C1-2 articulations are preserved in the dens is intact. Vertebral body heights maintained. No acute fracture. Soft tissues and spinal canal: Soft tissues of the neck demonstrate no acute finding. No abnormal prevertebral edema. Spinal canal within normal limits. Disc levels: Mild right-sided facet arthrosis at C2-3 and C3-4. No significant spinal stenosis within the cervical spine. Upper chest: Visualized upper chest demonstrates no acute finding. Upper lobe predominant centrilobular emphysema noted. Other: None. IMPRESSION: CT  HEAD: 1. Acute bilateral subdural hematomas, right larger than left as above. Associated trace 1-2 mm right-to-left midline shift. 2. No other acute intracranial abnormality. 3. Mild age-related cerebral atrophy with chronic small vessel ischemic disease. CT MAXILLOFACIAL: 1. Large right  periorbital/facial soft tissue contusion. 2. No other acute maxillofacial injury. No fracture. Intact globes with no retro-orbital pathology. CT CERVICAL SPINE: No acute traumatic injury within the cervical spine. Critical Value/emergent results were called by telephone at the time of interpretation on 09/26/2018 at 10:50 pm to Dr. Shelby Dubin , who verbally acknowledged these results. Electronically Signed   By: Jeannine Boga M.D.   On: 09/26/2018 22:52        Scheduled Meds:  acetaminophen  650 mg Oral Q6H   anastrozole  1 mg Oral Daily   atorvastatin  40 mg Oral Daily   cholestyramine  4 g Oral BID   citalopram  20 mg Oral Daily   magnesium oxide  400 mg Oral Daily   OLANZapine  7.5 mg Oral Daily   omega-3 acid ethyl esters  1 g Oral Daily   Continuous Infusions:    LOS: 1 day    Time spent: over 30 min    Fayrene Helper, MD [facility] Pager AMION  If 7PM-7AM, please contact night-coverage [email or web address] Password [facility] 09/28/2018, 4:06 PM

## 2018-09-28 NOTE — Telephone Encounter (Signed)
Dr Florene Glen, Hospitalist called.- Mrs Gerhard Munch fell- has subdurals and fractures. He asked for any insight about surgical risk. Objectives are in [facility]. Last seen here Oct, 2019. I suggested from pulmonary standpoint that he do what needs to be done and get hospital Pulmonary Consult to help as needed.

## 2018-09-29 ENCOUNTER — Telehealth: Payer: Self-pay | Admitting: Internal Medicine

## 2018-09-29 ENCOUNTER — Inpatient Hospital Stay (HOSPITAL_COMMUNITY): Payer: PPO

## 2018-09-29 LAB — CBC WITH DIFFERENTIAL/PLATELET
Abs Immature Granulocytes: 0.21 10*3/uL — ABNORMAL HIGH (ref 0.00–0.07)
Basophils Absolute: 0 10*3/uL (ref 0.0–0.1)
Basophils Relative: 0 %
Eosinophils Absolute: 0.1 10*3/uL (ref 0.0–0.5)
Eosinophils Relative: 1 %
HCT: 21.6 % — ABNORMAL LOW (ref 36.0–46.0)
Hemoglobin: 6.7 g/dL — CL (ref 12.0–15.0)
Immature Granulocytes: 1 %
Lymphocytes Relative: 6 %
Lymphs Abs: 0.9 10*3/uL (ref 0.7–4.0)
MCH: 28 pg (ref 26.0–34.0)
MCHC: 31 g/dL (ref 30.0–36.0)
MCV: 90.4 fL (ref 80.0–100.0)
Monocytes Absolute: 5.8 10*3/uL — ABNORMAL HIGH (ref 0.1–1.0)
Monocytes Relative: 39 %
Neutro Abs: 8 10*3/uL — ABNORMAL HIGH (ref 1.7–7.7)
Neutrophils Relative %: 53 %
Platelets: 44 10*3/uL — ABNORMAL LOW (ref 150–400)
RBC: 2.39 MIL/uL — ABNORMAL LOW (ref 3.87–5.11)
RDW: 15.5 % (ref 11.5–15.5)
WBC: 15 10*3/uL — ABNORMAL HIGH (ref 4.0–10.5)
nRBC: 0.1 % (ref 0.0–0.2)

## 2018-09-29 LAB — HEMOGLOBIN AND HEMATOCRIT, BLOOD
HCT: 27.3 % — ABNORMAL LOW (ref 36.0–46.0)
Hemoglobin: 9 g/dL — ABNORMAL LOW (ref 12.0–15.0)

## 2018-09-29 LAB — MAGNESIUM: Magnesium: 2 mg/dL (ref 1.7–2.4)

## 2018-09-29 LAB — COMPREHENSIVE METABOLIC PANEL
ALT: 14 U/L (ref 0–44)
AST: 29 U/L (ref 15–41)
Albumin: 3.4 g/dL — ABNORMAL LOW (ref 3.5–5.0)
Alkaline Phosphatase: 63 U/L (ref 38–126)
Anion gap: 11 (ref 5–15)
BUN: 8 mg/dL (ref 8–23)
CO2: 26 mmol/L (ref 22–32)
Calcium: 8.4 mg/dL — ABNORMAL LOW (ref 8.9–10.3)
Chloride: 101 mmol/L (ref 98–111)
Creatinine, Ser: 0.56 mg/dL (ref 0.44–1.00)
GFR calc Af Amer: >60 mL/min (ref >60)
GFR calc non Af Amer: >60 mL/min (ref >60)
Glucose, Bld: 123 mg/dL — ABNORMAL HIGH (ref 70–99)
Potassium: 4.3 mmol/L (ref 3.5–5.1)
Sodium: 138 mmol/L (ref 135–145)
Total Bilirubin: 1.3 mg/dL — ABNORMAL HIGH (ref 0.3–1.2)
Total Protein: 5.9 g/dL — ABNORMAL LOW (ref 6.5–8.1)

## 2018-09-29 LAB — PATHOLOGIST SMEAR REVIEW: Path Review: INCREASED

## 2018-09-29 LAB — PREPARE RBC (CROSSMATCH)

## 2018-09-29 MED ORDER — SODIUM CHLORIDE 0.9% IV SOLUTION
Freq: Once | INTRAVENOUS | Status: AC
  Administered 2018-09-29: 06:00:00 via INTRAVENOUS

## 2018-09-29 MED ORDER — IPRATROPIUM-ALBUTEROL 0.5-2.5 (3) MG/3ML IN SOLN: 3.0000 mL | Freq: Four times a day (QID) | RESPIRATORY_TRACT | Status: DC | PRN

## 2018-09-29 MED ORDER — OLANZAPINE 5 MG PO TABS
2.5000 mg | ORAL_TABLET | Freq: Every day | ORAL | Status: DC
  Administered 2018-09-30 – 2018-10-03 (×4): 2.5 mg via ORAL
  Filled 2018-09-29 (×4): qty 1

## 2018-09-29 MED ORDER — SODIUM CHLORIDE 0.9% IV SOLUTION: Freq: Once | INTRAVENOUS | Status: DC

## 2018-09-29 NOTE — Progress Notes (Signed)
Spoke with son and he talked with surgeon about tomorrow. Joanmarie wants to talk with son before consenting form.

## 2018-09-29 NOTE — Progress Notes (Signed)
PROGRESS NOTE    Summer Williams  ZOX:[id number] DOB: 09-Apr-1946 DOA: 09/26/2018 PCP: Elby Showers, MD   Brief Narrative:  Summer Williams is a 73/F with chronic respiratory failure, COPD on 3 L home oxygen, chronic thrombocytopenia, history of breast cancer, and lung cancer admitted following a fall at home after she tripped.  She hit her head but did not lose consciousness.    In the ER patient, imaging noted bilateral subdural hematoma with mild shift.  Also showed right periorbital hematoma.  There was mild laceration on the right pupil area for which patient declined sutures. labs also showed anemia with severe thrombocytopenia.   -X-rays revealed right distal humerus and distal radius and ulnar styloid fracture.  On-call neurosurgeon Dr. Venetia Constable was consulted at this time requested admission for further observation and also to transfuse platelets because of the low platelets and bleed.  - For the fractures of the right upper extremity Dr. Percell Miller was consulted.    Assessment & Plan:  1.  Bilateral subdural hematoma with right periorbital hematoma - 1. Neurosurgery c/s - s/p 1 unit platelets given thrombocytopenia.  No plans for surgery at this time.  Recommend holding ASA x 1 week.   2. Right upper extremity fractures  Comminuted Fracture Involving Greater Tuberosity of Humerous with suspected transverse fracture lucency at the R humeral neck  Distal Humerus Fracture  distal radius and ulnar styloid fracture  - Dr.Powell discussed with her Pulmonologist Dr.Young yesterday, recommended to continue current care and consult Pulm if needed -she is stable from a pulmonary standpoint at this time, on 2 L, no active wheezing encouraged incentive spirometer, added duo nebs -Does have moderate pulmonary risks, explained to son and daughter-in-law, if decompensates from a respiratory standpoint will ask pulmonary to consult  3.   COPD/chronic respiratory failure -No active wheezing at  this time, add duo nebs as needed, encouraged incentive spirometer especially postoperatively -Encouraged importance of increasing activity as soon as possible -See discussions above  4.  Severe chronic thrombocytopenia -long Standing, ongoing for a number of years -Etiology unclear, could be from liver disease, prior history of alcoholism quit 38 years ago, check hepatitis panel, check liver ultrasound  5.  History of breast cancer -On anastrozole  6.  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 Lindi Adie  7.  Acute on chronic anemia -Likely from blood loss from multiple fractures and hemodilution -Discontinued IV fluids, transfuse 1 unit of PRBC this morning -Monitor CBC  8.Schizoaffective disorder  -on Zyprexa and citalopram.  DVT prophylaxis: SCDs Code Status: dnr Family Communication: none at bedside, discussed with son Summer Williams Disposition Plan: pending   Consultants:   Ortho  neurosurgery  Procedures:   none  Antimicrobials:  Anti-infectives (From admission, onward)   None          Subjective: C/o pain to R arm  Objective: Vitals:   09/29/18 0616 09/29/18 0631 09/29/18 0835 09/29/18 1215  BP:  113/73 137/65 96/63  Pulse:   (!) 119 (!) 121  Resp:   (!) 35 20  Temp: 100 F (37.8 C) 100.1 F (37.8 C) (!) 100.6 F (38.1 C) 99.2 F (37.3 C)  TempSrc: Oral Oral Oral Oral  SpO2:   96% 98%  Weight:      Height:        Intake/Output Summary (Last 24 hours) at 09/29/2018 1348 Last data filed  at 09/29/2018 0835 Gross per 24 hour  Intake 315 ml  Output -  Net 315 ml   Filed Weights   09/26/18 2012  Weight: 76.2 kg    Examination:  Gen: Awake, Alert, Oriented X 3,  HEENT: Bruising around right eye Lungs: Good air movement, no expiratory wheezes CVS: RRR,No Gallops,Rubs or new Murmurs Abd: soft, Non tender, non  distended, BS present Extremities: No edema, right arm in sling Skin: no new rashes     Data Reviewed: I have personally reviewed following labs and imaging studies  CBC: Recent Labs  Lab 09/26/18 2049 09/27/18 0736 09/28/18 0314 09/28/18 0813 09/29/18 0349 09/29/18 1216  WBC 9.8 11.0* 13.6*  --  15.0*  --   NEUTROABS 5.9 9.1*  --   --  8.0*  --   HGB 9.7* 8.0* 7.0* 7.8* 6.7* 9.0*  HCT 31.9* 25.5* 22.6* 26.0* 21.6* 27.3*  MCV 90.6 88.2 90.4  --  90.4  --   PLT 38* 63* 50*  --  44*  --    Basic Metabolic Panel: Recent Labs  Lab 09/26/18 2049 09/27/18 0736 09/27/18 1916 09/28/18 0314 09/29/18 0349  NA 140 143 143  --  138  K 4.0 3.7 3.9  --  4.3  CL 102 105 106  --  101  CO2 32 30 29  --  26  GLUCOSE 116* 199* 156*  --  123*  BUN 14 12 9   --  8  CREATININE 0.66 0.70 0.57  --  0.56  CALCIUM 9.0 8.7* 8.9  --  8.4*  MG  --   --   --  2.1 2.0   GFR: Estimated Creatinine Clearance: 61.7 mL/min (by C-G formula based on SCr of 0.56 mg/dL). Liver Function Tests: Recent Labs  Lab 09/27/18 0736 09/27/18 1916 09/29/18 0349  AST 37 24 29  ALT 22 20 14   ALKPHOS 65 59 63  BILITOT 0.8 0.6 1.3*  PROT 5.9* 5.7* 5.9*  ALBUMIN 3.5 3.5 3.4*   No results for input(s): LIPASE, AMYLASE in the last 168 hours. No results for input(s): AMMONIA in the last 168 hours. Coagulation Profile: Recent Labs  Lab 09/26/18 2347  INR 1.1   Cardiac Enzymes: No results for input(s): CKTOTAL, CKMB, CKMBINDEX, TROPONINI in the last 168 hours. BNP (last 3 results) No results for input(s): PROBNP in the last 8760 hours. HbA1C: No results for input(s): HGBA1C in the last 72 hours. CBG: No results for input(s): GLUCAP in the last 168 hours. Lipid Profile: No results for input(s): CHOL, HDL, LDLCALC, TRIG, CHOLHDL, LDLDIRECT in the last 72 hours. Thyroid Function Tests: No results for input(s): TSH, T4TOTAL, FREET4, T3FREE, THYROIDAB in the last 72 hours. Anemia Panel: Recent Labs     09/27/18 0736  VITAMINB12 330  FOLATE 18.9  FERRITIN 12  TIBC 371  IRON 31  RETICCTPCT 1.4   Sepsis Labs: No results for input(s): PROCALCITON, LATICACIDVEN in the last 168 hours.  No results found for this or any previous visit (from the past 240 hour(s)).       Radiology Studies: No results found.      Scheduled Meds: . acetaminophen  650 mg Oral Q6H  . anastrozole  1 mg Oral Daily  . atorvastatin  40 mg Oral Daily  . cholestyramine  4 g Oral BID  . citalopram  20 mg Oral Daily  . magnesium oxide  400 mg Oral Daily  . OLANZapine  7.5 mg Oral Daily  . omega-3 acid  ethyl esters  1 g Oral Daily   Continuous Infusions:    LOS: 2 days    Time spent: over 30 min    Domenic Polite, MD [facility]  09/29/2018, 1:48 PM

## 2018-09-29 NOTE — Telephone Encounter (Signed)
Attempted to return call to [facility] as requested. LMOM.

## 2018-09-29 NOTE — Social Work (Signed)
CSW continuing to follow for support with disposition when medically appropriate. Pt will need PT/OT evaluations post surgery to assist with disposition planning.   Westley Hummer, MSW, [place] Work [phone]

## 2018-09-29 NOTE — Progress Notes (Signed)
Unit of PRBC started with no adverse reaction noted . Pt voiced no c/o pain or discomfort no visible sign of distress noted . Blood infusing satisfactory at this time.

## 2018-09-29 NOTE — Telephone Encounter (Signed)
Looked at pt's chart in epic for upcoming visits and it shows that pt is having surgery tomorrow for open reduction internal fixation of distal humerus fracture by Dr. Edmonia Lynch with Orthopedics.   Called and spoke with pt's son Jeneen Rinks who had concerns about pt having surgery tomorrow with her respiratory conditions. I did relay the info to [place] from where Dr. Annamaria Boots spoke with Dr. Florene Glen at the hospital giving him the input for surgical risks and stated to Jeneen Rinks that Dr. Annamaria Boots told Dr. Florene Glen to get hospital pulmonary consult to help as needed with pt when she has the surgery.  Even after stating this info to Karen Kitchens still had concerns and asked if Dr. Annamaria Boots could call him personally to discuss this with him.  Dr. Annamaria Boots, please advise on this. Jeneen Rinks can be reached at [phone]. Thanks!

## 2018-09-29 NOTE — Progress Notes (Signed)
Patient was feeling alittle dizzy this evening so I increased her O2 back to 3 Liters.  She talked to son and agreed that she needed the surgery and will sign the consent form tonight. She did mention she did not want a student doctor doing her surgery.

## 2018-09-29 NOTE — Progress Notes (Signed)
Patients son is the healthcare power of attorney.  He wants to speak with surgeon and pulmonary MD before her surgery tomorrow.  I gave the son the number to orthopedic office and I paged [facility].  Still waiting for a return call.  I need MD to contact pulmonologist because I cant put in a pulmonary consult order STAT unless they have spoken about this situation.

## 2018-09-29 NOTE — Anesthesia Preprocedure Evaluation (Addendum)
Anesthesia Evaluation  Patient identified by MRN, date of birth, ID band Patient awake    Reviewed: Allergy & Precautions, NPO status , Patient's Chart, lab work & pertinent test results  Airway Mallampati: II  TM Distance: >3 FB Neck ROM: Full    Dental no notable dental hx. (+) Teeth Intact   Pulmonary shortness of breath and Long-Term Oxygen Therapy, COPD,  oxygen dependent, former smoker,  Lung CA  And Currently on 3L [place]   Pulmonary exam normal breath sounds clear to auscultation       Cardiovascular Exercise Tolerance: Good negative cardio ROS Normal cardiovascular exam Rhythm:Regular Rate:Normal  09/26/18  ST w LAFB   Neuro/Psych Anxiety Schizoaffective disorderSubdural hematoma on admission    GI/Hepatic negative GI ROS, Neg liver ROS,   Endo/Other  negative endocrine ROS  Renal/GU K+ 4.3 Cr 0.56     Musculoskeletal   Abdominal   Peds  Hematology  (+) Blood dyscrasia, , Thrombocytopenia  Hgb 9.0 Plt 44    Anesthesia Other Findings Breast CA, multiple R orbital bruises 2nd to fall   Reproductive/Obstetrics                           Anesthesia Physical Anesthesia Plan  ASA: III  Anesthesia Plan: General and Regional   Post-op Pain Management:    Induction: Intravenous  PONV Risk Score and Plan: 2 and Treatment may vary due to age or medical condition, Ondansetron and Dexamethasone  Airway Management Planned: Oral ETT  Additional Equipment:   Intra-op Plan:   Post-operative Plan: Extubation in OR and Possible Post-op intubation/ventilation  Informed Consent: I have reviewed the patients History and Physical, chart, labs and discussed the procedure including the risks, benefits and alternatives for the proposed anesthesia with the patient or authorized representative who has indicated his/her understanding and acceptance.   Patient has DNR.  Discussed DNR with power of  attorney and Suspend DNR.   Dental advisory given  Plan Discussed with: CRNA  Anesthesia Plan Comments: (GA + R interscalene block will discuss w patient and son as post op pain control + breathing will be an issue as to the best pathway forwart  , check plt availability New 88k, Pt is DNR discussed w POA about allowing for brief  suspension of DNR to allow for the reversal of anesthetic affects. )       Anesthesia Quick Evaluation

## 2018-09-30 ENCOUNTER — Telehealth: Payer: Self-pay | Admitting: Hematology and Oncology

## 2018-09-30 ENCOUNTER — Encounter (HOSPITAL_COMMUNITY)
Admission: EM | Disposition: A | Discharge status: Home Health | Payer: Self-pay | Source: Home / Self Care | Attending: Internal Medicine

## 2018-09-30 ENCOUNTER — Inpatient Hospital Stay (HOSPITAL_COMMUNITY): Payer: PPO | Admitting: Anesthesiology

## 2018-09-30 ENCOUNTER — Inpatient Hospital Stay (HOSPITAL_COMMUNITY): Payer: PPO

## 2018-09-30 ENCOUNTER — Other Ambulatory Visit: Payer: Self-pay | Admitting: *Deleted

## 2018-09-30 ENCOUNTER — Encounter (HOSPITAL_COMMUNITY): Payer: Self-pay | Admitting: Anesthesiology

## 2018-09-30 DIAGNOSIS — C7951 Secondary malignant neoplasm of bone: Secondary | ICD-10-CM

## 2018-09-30 HISTORY — PX: ORIF HUMERUS FRACTURE: SHX2126

## 2018-09-30 HISTORY — PX: ORIF WRIST FRACTURE: SHX2133

## 2018-09-30 LAB — COMPREHENSIVE METABOLIC PANEL
ALT: 15 U/L (ref 0–44)
AST: 18 U/L (ref 15–41)
Albumin: 3.4 g/dL — ABNORMAL LOW (ref 3.5–5.0)
Alkaline Phosphatase: 61 U/L (ref 38–126)
Anion gap: 9 (ref 5–15)
BUN: 6 mg/dL — ABNORMAL LOW (ref 8–23)
CO2: 30 mmol/L (ref 22–32)
Calcium: 8.6 mg/dL — ABNORMAL LOW (ref 8.9–10.3)
Chloride: 100 mmol/L (ref 98–111)
Creatinine, Ser: 0.56 mg/dL (ref 0.44–1.00)
GFR calc Af Amer: >60 mL/min (ref >60)
GFR calc non Af Amer: >60 mL/min (ref >60)
Glucose, Bld: 121 mg/dL — ABNORMAL HIGH (ref 70–99)
Potassium: 3.4 mmol/L — ABNORMAL LOW (ref 3.5–5.1)
Sodium: 139 mmol/L (ref 135–145)
Total Bilirubin: 1.3 mg/dL — ABNORMAL HIGH (ref 0.3–1.2)
Total Protein: 6.1 g/dL — ABNORMAL LOW (ref 6.5–8.1)

## 2018-09-30 LAB — BPAM PLATELET PHERESIS
Blood Product Expiration Date: 202004302359
Blood Product Expiration Date: 202005012359
Blood Product Expiration Date: 202005022359
ISSUE DATE / TIME: 202004292012
ISSUE DATE / TIME: 202004292151
Unit Type and Rh: 5100
Unit Type and Rh: 7300
Unit Type and Rh: 7300

## 2018-09-30 LAB — PREPARE PLATELET PHERESIS
Unit division: 0
Unit division: 0
Unit division: 0

## 2018-09-30 LAB — HEPATITIS C ANTIBODY: HCV Ab: <0.1 s/co ratio (ref 0.0–0.9)

## 2018-09-30 LAB — TYPE AND SCREEN
ABO/RH(D): O POS
Antibody Screen: NEGATIVE
Unit division: 0

## 2018-09-30 LAB — VITAMIN B12: Vitamin B-12: 324 pg/mL (ref 180–914)

## 2018-09-30 LAB — CBC
HCT: 25.1 % — ABNORMAL LOW (ref 36.0–46.0)
Hemoglobin: 8.1 g/dL — ABNORMAL LOW (ref 12.0–15.0)
MCH: 28.4 pg (ref 26.0–34.0)
MCHC: 32.3 g/dL (ref 30.0–36.0)
MCV: 88.1 fL (ref 80.0–100.0)
Platelets: 88 10*3/uL — ABNORMAL LOW (ref 150–400)
RBC: 2.85 MIL/uL — ABNORMAL LOW (ref 3.87–5.11)
RDW: 15.2 % (ref 11.5–15.5)
WBC: 16.6 10*3/uL — ABNORMAL HIGH (ref 4.0–10.5)
nRBC: 0 % (ref 0.0–0.2)

## 2018-09-30 LAB — BPAM RBC
Blood Product Expiration Date: 202005022359
ISSUE DATE / TIME: 202004290606
Unit Type and Rh: 9500

## 2018-09-30 LAB — HEPATITIS B SURFACE ANTIGEN: Hepatitis B Surface Ag: NEGATIVE

## 2018-09-30 LAB — PREPARE RBC (CROSSMATCH)

## 2018-09-30 SURGERY — OPEN REDUCTION INTERNAL FIXATION (ORIF) DISTAL HUMERUS FRACTURE
Anesthesia: Regional | Laterality: Right

## 2018-09-30 MED ORDER — PROPOFOL 10 MG/ML IV BOLUS
INTRAVENOUS | Status: DC | PRN
  Administered 2018-09-30: 100 mg via INTRAVENOUS
  Administered 2018-09-30: 20 mg via INTRAVENOUS

## 2018-09-30 MED ORDER — LACTATED RINGERS IV SOLN
INTRAVENOUS | Status: DC | PRN
  Administered 2018-09-30: 07:00:00 via INTRAVENOUS

## 2018-09-30 MED ORDER — ROPIVACAINE HCL 5 MG/ML IJ SOLN
INTRAMUSCULAR | Status: DC | PRN
  Administered 2018-09-30: 30 mL via PERINEURAL

## 2018-09-30 MED ORDER — CEFAZOLIN SODIUM-DEXTROSE 2-3 GM-%(50ML) IV SOLR
INTRAVENOUS | Status: DC | PRN
  Administered 2018-09-30: 2 g via INTRAVENOUS

## 2018-09-30 MED ORDER — POTASSIUM CHLORIDE CRYS ER 20 MEQ PO TBCR
40.0000 meq | EXTENDED_RELEASE_TABLET | Freq: Once | ORAL | Status: AC
  Administered 2018-09-30: 16:00:00 40 meq via ORAL
  Filled 2018-09-30: qty 2

## 2018-09-30 MED ORDER — FENTANYL CITRATE (PF) 250 MCG/5ML IJ SOLN
INTRAMUSCULAR | Status: AC
  Filled 2018-09-30: qty 5

## 2018-09-30 MED ORDER — EPHEDRINE SULFATE 50 MG/ML IJ SOLN
INTRAMUSCULAR | Status: DC | PRN
  Administered 2018-09-30: 20 mg via INTRAVENOUS

## 2018-09-30 MED ORDER — ONDANSETRON HCL 4 MG PO TABS: 4.0000 mg | ORAL_TABLET | Freq: Four times a day (QID) | ORAL | Status: DC | PRN

## 2018-09-30 MED ORDER — FENTANYL CITRATE (PF) 100 MCG/2ML IJ SOLN
INTRAMUSCULAR | Status: DC | PRN
  Administered 2018-09-30 (×3): 25 ug via INTRAVENOUS

## 2018-09-30 MED ORDER — SUCCINYLCHOLINE CHLORIDE 20 MG/ML IJ SOLN
INTRAMUSCULAR | Status: DC | PRN
  Administered 2018-09-30: 100 mg via INTRAVENOUS

## 2018-09-30 MED ORDER — DOCUSATE SODIUM 100 MG PO CAPS
100.0000 mg | ORAL_CAPSULE | Freq: Two times a day (BID) | ORAL | Status: DC
  Administered 2018-09-30 – 2018-10-04 (×8): 100 mg via ORAL
  Filled 2018-09-30 (×8): qty 1

## 2018-09-30 MED ORDER — FLUMAZENIL 0.5 MG/5ML IV SOLN
INTRAVENOUS | Status: DC | PRN
  Administered 2018-09-30 (×3): .1 mg via INTRAVENOUS

## 2018-09-30 MED ORDER — PHENYLEPHRINE 40 MCG/ML (10ML) SYRINGE FOR IV PUSH (FOR BLOOD PRESSURE SUPPORT)
PREFILLED_SYRINGE | INTRAVENOUS | Status: AC
  Filled 2018-09-30: qty 20

## 2018-09-30 MED ORDER — FUROSEMIDE 10 MG/ML IJ SOLN
20.0000 mg | Freq: Once | INTRAMUSCULAR | Status: AC
  Administered 2018-09-30: 20 mg via INTRAVENOUS
  Filled 2018-09-30: qty 2

## 2018-09-30 MED ORDER — MIDAZOLAM HCL 5 MG/5ML IJ SOLN
INTRAMUSCULAR | Status: DC | PRN
  Administered 2018-09-30: 1 mg via INTRAVENOUS

## 2018-09-30 MED ORDER — METOCLOPRAMIDE HCL 10 MG PO TABS: 5.0000 mg | ORAL_TABLET | Freq: Three times a day (TID) | ORAL | Status: DC | PRN

## 2018-09-30 MED ORDER — LIDOCAINE 2% (20 MG/ML) 5 ML SYRINGE
INTRAMUSCULAR | Status: DC | PRN
  Administered 2018-09-30: 100 mg via INTRAVENOUS

## 2018-09-30 MED ORDER — CEFAZOLIN SODIUM-DEXTROSE 2-4 GM/100ML-% IV SOLN
2.0000 g | Freq: Four times a day (QID) | INTRAVENOUS | Status: AC
  Administered 2018-09-30 – 2018-10-01 (×3): 2 g via INTRAVENOUS
  Filled 2018-09-30 (×3): qty 100

## 2018-09-30 MED ORDER — KETAMINE HCL 10 MG/ML IJ SOLN
INTRAMUSCULAR | Status: DC | PRN
  Administered 2018-09-30: 20 mg via INTRAVENOUS

## 2018-09-30 MED ORDER — 0.9 % SODIUM CHLORIDE (POUR BTL) OPTIME
TOPICAL | Status: DC | PRN
  Administered 2018-09-30: 09:00:00 1000 mL

## 2018-09-30 MED ORDER — POLYETHYLENE GLYCOL 3350 17 G PO PACK: 17.0000 g | PACK | Freq: Every day | ORAL | Status: DC | PRN

## 2018-09-30 MED ORDER — PHENYLEPHRINE HCL (PRESSORS) 10 MG/ML IV SOLN
INTRAVENOUS | Status: AC
  Filled 2018-09-30: qty 1

## 2018-09-30 MED ORDER — ROCURONIUM BROMIDE 10 MG/ML (PF) SYRINGE
PREFILLED_SYRINGE | INTRAVENOUS | Status: AC
  Filled 2018-09-30: qty 10

## 2018-09-30 MED ORDER — FUROSEMIDE 10 MG/ML IJ SOLN
20.0000 mg | Freq: Once | INTRAMUSCULAR | Status: AC
  Administered 2018-09-30: 16:00:00 20 mg via INTRAVENOUS
  Filled 2018-09-30: qty 2

## 2018-09-30 MED ORDER — VASOPRESSIN 20 UNIT/ML IV SOLN
0.0300 [IU]/min | INTRAVENOUS | Status: DC
  Administered 2018-09-30: 0.03 [IU]/min via INTRAVENOUS
  Filled 2018-09-30: qty 2

## 2018-09-30 MED ORDER — ALBUMIN HUMAN 5 % IV SOLN
INTRAVENOUS | Status: DC | PRN
  Administered 2018-09-30 (×3): via INTRAVENOUS

## 2018-09-30 MED ORDER — ONDANSETRON HCL 4 MG/2ML IJ SOLN
INTRAMUSCULAR | Status: AC
  Filled 2018-09-30: qty 2

## 2018-09-30 MED ORDER — ONDANSETRON HCL 4 MG/2ML IJ SOLN: 4.0000 mg | Freq: Once | INTRAMUSCULAR | Status: DC | PRN

## 2018-09-30 MED ORDER — PROPOFOL 10 MG/ML IV BOLUS
INTRAVENOUS | Status: AC
  Filled 2018-09-30: qty 20

## 2018-09-30 MED ORDER — ONDANSETRON HCL 4 MG/2ML IJ SOLN: 4.0000 mg | Freq: Four times a day (QID) | INTRAMUSCULAR | Status: DC | PRN

## 2018-09-30 MED ORDER — TRANEXAMIC ACID-NACL 1000-0.7 MG/100ML-% IV SOLN
INTRAVENOUS | Status: AC
  Filled 2018-09-30: qty 100

## 2018-09-30 MED ORDER — SUCCINYLCHOLINE CHLORIDE 200 MG/10ML IV SOSY
PREFILLED_SYRINGE | INTRAVENOUS | Status: AC
  Filled 2018-09-30: qty 10

## 2018-09-30 MED ORDER — KETAMINE HCL 50 MG/5ML IJ SOSY
PREFILLED_SYRINGE | INTRAMUSCULAR | Status: AC
  Filled 2018-09-30: qty 5

## 2018-09-30 MED ORDER — ONDANSETRON HCL 4 MG/2ML IJ SOLN
INTRAMUSCULAR | Status: DC | PRN
  Administered 2018-09-30: 4 mg via INTRAVENOUS

## 2018-09-30 MED ORDER — EPHEDRINE 5 MG/ML INJ
INTRAVENOUS | Status: AC
  Filled 2018-09-30: qty 10

## 2018-09-30 MED ORDER — PHENYLEPHRINE HCL (PRESSORS) 10 MG/ML IV SOLN
INTRAVENOUS | Status: DC | PRN
  Administered 2018-09-30: 80 ug via INTRAVENOUS
  Administered 2018-09-30: 120 ug via INTRAVENOUS
  Administered 2018-09-30 (×4): 80 ug via INTRAVENOUS

## 2018-09-30 MED ORDER — METOCLOPRAMIDE HCL 5 MG/ML IJ SOLN: 5.0000 mg | Freq: Three times a day (TID) | INTRAMUSCULAR | Status: DC | PRN

## 2018-09-30 MED ORDER — SODIUM CHLORIDE 0.9% IV SOLUTION: Freq: Once | INTRAVENOUS | Status: DC

## 2018-09-30 MED ORDER — TRANEXAMIC ACID-NACL 1000-0.7 MG/100ML-% IV SOLN
1000.0000 mg | INTRAVENOUS | Status: AC
  Administered 2018-09-30: 1000 mg via INTRAVENOUS

## 2018-09-30 MED ORDER — ROCURONIUM BROMIDE 50 MG/5ML IV SOSY
PREFILLED_SYRINGE | INTRAVENOUS | Status: DC | PRN
  Administered 2018-09-30: 20 mg via INTRAVENOUS

## 2018-09-30 MED ORDER — MIDAZOLAM HCL 2 MG/2ML IJ SOLN
INTRAMUSCULAR | Status: AC
  Filled 2018-09-30: qty 2

## 2018-09-30 MED ORDER — LIDOCAINE 2% (20 MG/ML) 5 ML SYRINGE
INTRAMUSCULAR | Status: AC
  Filled 2018-09-30: qty 5

## 2018-09-30 MED ORDER — DEXAMETHASONE SODIUM PHOSPHATE 10 MG/ML IJ SOLN
INTRAMUSCULAR | Status: AC
  Filled 2018-09-30: qty 1

## 2018-09-30 MED ORDER — FENTANYL CITRATE (PF) 100 MCG/2ML IJ SOLN: 25.0000 ug | INTRAMUSCULAR | Status: DC | PRN

## 2018-09-30 MED ORDER — SODIUM CHLORIDE 0.9 % IV SOLN
INTRAVENOUS | Status: DC | PRN
  Administered 2018-09-30: 100 ug/min via INTRAVENOUS
  Administered 2018-09-30: 09:00:00 via INTRAVENOUS
  Administered 2018-09-30: 50 ug/min via INTRAVENOUS
  Administered 2018-09-30: 10:00:00 via INTRAVENOUS

## 2018-09-30 MED ORDER — ACETAMINOPHEN 10 MG/ML IV SOLN: 1000.0000 mg | Freq: Once | INTRAVENOUS | Status: DC | PRN

## 2018-09-30 SURGICAL SUPPLY — 97 items
BANDAGE ACE 3X5.8 VEL STRL LF (GAUZE/BANDAGES/DRESSINGS) ×3 IMPLANT
BANDAGE ACE 4X5 VEL STRL LF (GAUZE/BANDAGES/DRESSINGS) ×3 IMPLANT
BENZOIN TINCTURE PRP APPL 2/3 (GAUZE/BANDAGES/DRESSINGS) ×3 IMPLANT
BIT DRILL 2.2 SS TIBIAL (BIT) ×2 IMPLANT
BIT DRILL 2.6 (BIT) ×2 IMPLANT
BLADE CLIPPER SURG (BLADE) ×1 IMPLANT
BLADE SURG 10 STRL SS (BLADE) IMPLANT
BNDG COHESIVE 4X5 TAN STRL (GAUZE/BANDAGES/DRESSINGS) ×3 IMPLANT
BNDG ELASTIC 6X10 VLCR STRL LF (GAUZE/BANDAGES/DRESSINGS) ×2 IMPLANT
BNDG ESMARK 4X9 LF (GAUZE/BANDAGES/DRESSINGS) ×3 IMPLANT
BNDG GAUZE ELAST 4 BULKY (GAUZE/BANDAGES/DRESSINGS) ×7 IMPLANT
CHLORAPREP W/TINT 26 (MISCELLANEOUS) ×4 IMPLANT
CLOSURE WOUND 1/2 X4 (GAUZE/BANDAGES/DRESSINGS) ×1
CORDS BIPOLAR (ELECTRODE) ×3 IMPLANT
COVER SURGICAL LIGHT HANDLE (MISCELLANEOUS) ×4 IMPLANT
COVER WAND RF STERILE (DRAPES) ×3 IMPLANT
CUFF TOURNIQUET SINGLE 18IN (TOURNIQUET CUFF) ×3 IMPLANT
CUFF TOURNIQUET SINGLE 24IN (TOURNIQUET CUFF) IMPLANT
DECANTER SPIKE VIAL GLASS SM (MISCELLANEOUS) IMPLANT
DRAPE C-ARM 42X72 X-RAY (DRAPES) ×1 IMPLANT
DRAPE IMP U-DRAPE 54X76 (DRAPES) ×6 IMPLANT
DRAPE INCISE IOBAN 66X45 STRL (DRAPES) IMPLANT
DRAPE OEC MINIVIEW 54X84 (DRAPES) ×2 IMPLANT
DRAPE ORTHO SPLIT 77X108 STRL (DRAPES) ×4
DRAPE SURG ORHT 6 SPLT 77X108 (DRAPES) ×2 IMPLANT
DRAPE U-SHAPE 47X51 STRL (DRAPES) ×3 IMPLANT
DRSG ADAPTIC 3X8 NADH LF (GAUZE/BANDAGES/DRESSINGS) ×4 IMPLANT
DURAPREP 26ML APPLICATOR (WOUND CARE) ×1 IMPLANT
ELECT REM PT RETURN 9FT ADLT (ELECTROSURGICAL) ×3
ELECTRODE REM PT RTRN 9FT ADLT (ELECTROSURGICAL) ×1 IMPLANT
GAUZE SPONGE 4X4 12PLY STRL (GAUZE/BANDAGES/DRESSINGS) ×3 IMPLANT
GAUZE SPONGE 4X4 12PLY STRL LF (GAUZE/BANDAGES/DRESSINGS) ×4 IMPLANT
GAUZE XEROFORM 1X8 LF (GAUZE/BANDAGES/DRESSINGS) ×3 IMPLANT
GAUZE XEROFORM 5X9 LF (GAUZE/BANDAGES/DRESSINGS) ×3 IMPLANT
GLOVE BIO SURGEON STRL SZ7.5 (GLOVE) ×6 IMPLANT
GLOVE BIOGEL PI IND STRL 8 (GLOVE) ×2 IMPLANT
GLOVE BIOGEL PI INDICATOR 8 (GLOVE) ×4
GOWN STRL REUS W/ TWL LRG LVL3 (GOWN DISPOSABLE) ×2 IMPLANT
GOWN STRL REUS W/ TWL XL LVL3 (GOWN DISPOSABLE) ×1 IMPLANT
GOWN STRL REUS W/TWL LRG LVL3 (GOWN DISPOSABLE) ×4
GOWN STRL REUS W/TWL XL LVL3 (GOWN DISPOSABLE) ×2
K-WIRE 1.6 (WIRE) ×4
K-WIRE FX5X1.6XNS BN SS (WIRE) ×2
KIT BASIN OR (CUSTOM PROCEDURE TRAY) ×3 IMPLANT
KIT TURNOVER KIT B (KITS) ×3 IMPLANT
KWIRE FX5X1.6XNS BN SS (WIRE) IMPLANT
LOOP VESSEL MAXI BLUE (MISCELLANEOUS) ×2 IMPLANT
MANIFOLD NEPTUNE II (INSTRUMENTS) ×3 IMPLANT
NDL HYPO 25GX1X1/2 BEV (NEEDLE) IMPLANT
NEEDLE HYPO 25GX1X1/2 BEV (NEEDLE) IMPLANT
NS IRRIG 1000ML POUR BTL (IV SOLUTION) ×6 IMPLANT
PACK ORTHO EXTREMITY (CUSTOM PROCEDURE TRAY) ×3 IMPLANT
PAD ABD 8X10 STRL (GAUZE/BANDAGES/DRESSINGS) ×2 IMPLANT
PAD ARMBOARD 7.5X6 YLW CONV (MISCELLANEOUS) ×8 IMPLANT
PAD CAST 4YDX4 CTTN HI CHSV (CAST SUPPLIES) ×1 IMPLANT
PADDING CAST COTTON 4X4 STRL (CAST SUPPLIES) ×2
PEG LOCKING SMOOTH 2.2X16 (Screw) ×2 IMPLANT
PEG LOCKING SMOOTH 2.2X18 (Peg) ×12 IMPLANT
PLATE DVR CROSSLOCK STD RT (Plate) ×2 IMPLANT
PLATE LOCK POST LAT 105 4H (Plate) ×2 IMPLANT
PLATE MEDIAL 4HOLE (Plate) ×2 IMPLANT
SCREW  LP NL 2.7X15MM (Screw) ×4 IMPLANT
SCREW 2.7X14MM (Screw) ×2 IMPLANT
SCREW BN 14X2.7XNONLOCK 3 LD (Screw) IMPLANT
SCREW BONE 2.7X22MM (Screw) ×2 IMPLANT
SCREW BONE 3.5X24MM (Screw) ×2 IMPLANT
SCREW BONE 3.5X32MM (Screw) ×2 IMPLANT
SCREW BONE 60MMX3.5MM (Screw) ×2 IMPLANT
SCREW LOCKING 16MM (Screw) ×6 IMPLANT
SCREW LOCKING 18MMX3.5MM (Screw) ×2 IMPLANT
SCREW LOCKING 2.7X16MM (Screw) ×4 IMPLANT
SCREW LOCKING 20MM (Screw) ×4 IMPLANT
SCREW LOCKING 30MMX3.5MM (Screw) ×4 IMPLANT
SCREW LP NL 2.7X15MM (Screw) IMPLANT
SCREW NON LOCKING 22MM (Screw) ×4 IMPLANT
SPONGE LAP 18X18 RF (DISPOSABLE) ×2 IMPLANT
SPONGE LAP 4X18 RFD (DISPOSABLE) ×3 IMPLANT
STAPLER VISISTAT 35W (STAPLE) ×3 IMPLANT
STRIP CLOSURE SKIN 1/2X4 (GAUZE/BANDAGES/DRESSINGS) ×2 IMPLANT
SUCTION FRAZIER HANDLE 10FR (MISCELLANEOUS) ×2
SUCTION TUBE FRAZIER 10FR DISP (MISCELLANEOUS) ×1 IMPLANT
SUT ETHILON 3 0 PS 1 (SUTURE) ×2 IMPLANT
SUT MNCRL AB 4-0 PS2 18 (SUTURE) ×3 IMPLANT
SUT MON AB 2-0 CT1 36 (SUTURE) ×3 IMPLANT
SUT VIC AB 0 CT1 27 (SUTURE) ×2
SUT VIC AB 0 CT1 27XBRD ANBCTR (SUTURE) ×1 IMPLANT
SUT VIC AB 3-0 SH 27 (SUTURE) ×2
SUT VIC AB 3-0 SH 27XBRD (SUTURE) IMPLANT
SYR CONTROL 10ML LL (SYRINGE) IMPLANT
TOWEL OR 17X24 6PK STRL BLUE (TOWEL DISPOSABLE) ×3 IMPLANT
TOWEL OR 17X26 10 PK STRL BLUE (TOWEL DISPOSABLE) ×3 IMPLANT
TOWEL OR NON WOVEN STRL DISP B (DISPOSABLE) ×3 IMPLANT
TUBE CONNECTING 12'X1/4 (SUCTIONS) ×1
TUBE CONNECTING 12X1/4 (SUCTIONS) ×2 IMPLANT
UNDERPAD 30X30 (UNDERPADS AND DIAPERS) ×3 IMPLANT
WATER STERILE IRR 1000ML POUR (IV SOLUTION) ×6 IMPLANT
YANKAUER SUCT BULB TIP NO VENT (SUCTIONS) ×3 IMPLANT

## 2018-09-30 NOTE — Interval H&P Note (Signed)
I participated in the care of this patient and agree with the above history, physical and evaluation. I performed a review of the history and a physical exam as detailed.  I had a long talk with the patient as well as her son and daughter-in-law.  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  If she is stable after distal humerus fixation and doing well I would recommend going forward with open reduction internal fixation of the distal radius I discussed this as well with family will make that decision to go forward with that during surgery.  They are aware of both possible operative plans   Carole Binning MD

## 2018-09-30 NOTE — Telephone Encounter (Signed)
See phone note. I attempted to call Jeneen Rinks yesterday, got no answer, left call back message on machine.

## 2018-09-30 NOTE — Discharge Instructions (Signed)
Elevate arm and open close hand frequently to reduce swelling and pain.  Diet: As you were doing prior to hospitalization   Dressing:  Keep dressings on and dry until follow up.  Weight Bearing:  Non weight bearing right arm.  Maintain sling and dressings at all times.   To prevent constipation: you may use a stool softener such as -  Colace (over the counter) 100 mg by mouth twice a day  Drink plenty of fluids (prune juice may be helpful) and high fiber foods Miralax (over the counter) for constipation as needed.    Itching:  If you experience itching with your medications, try taking only a single pain pill, or even half a pain pill at a time.  You can also use benadryl over the counter for itching or also to help with sleep.   Precautions:  If you experience chest pain or shortness of breath - call 911 immediately for transfer to the hospital emergency department!!  If you develop a fever greater that 101 F, purulent drainage from wound, increased redness or drainage from wound, or calf pain -- Call the office at [phone]                                                 Follow- Up Appointment:  Please call for an appointment to be seen by Dr. Percell Miller in 1-2 weeks [facility] - [phone]

## 2018-09-30 NOTE — Anesthesia Procedure Notes (Signed)
Anesthesia Regional Block: Interscalene brachial plexus block   Pre-Anesthetic Checklist: ,, timeout performed, Correct Patient, Correct Site, Correct Laterality, Correct Procedure, Correct Position, site marked, Risks and benefits discussed,  Surgical consent,  Pre-op evaluation,  At surgeon's request and post-op pain management  Laterality: Upper and Right  Prep: Maximum Sterile Barrier Precautions used, chloraprep       Needles:  Injection technique: Single-shot  Needle Type: Echogenic Needle     Needle Length: 5cm  Needle Gauge: 21     Additional Needles:   Procedures:,,,, ultrasound used (permanent image in chart),,,,  Narrative:  Start time: 09/30/2018 7:20 AM End time: 09/30/2018 7:28 AM Injection made incrementally with aspirations every 5 mL.  Performed by: Personally  Anesthesiologist: Barnet Glasgow, MD  Additional Notes: Block assessed prior to procedure. Patient tolerated procedure well.

## 2018-09-30 NOTE — Progress Notes (Signed)
Dr Broadus John here- wants pt kept on O2 4L by simple mask. OK to tx back to 4 NP. Dr Broadus John fully updated son by phone.

## 2018-09-30 NOTE — Progress Notes (Signed)
PROGRESS NOTE    Summer Williams  FYB:[id number] DOB: 1945/09/09 DOA: 09/26/2018 PCP: Elby Showers, MD   Brief Narrative:  Summer Williams is a 73/F with chronic respiratory failure, COPD on 3 L home oxygen, chronic thrombocytopenia, history of breast cancer, and lung cancer admitted following a fall at home after she tripped.  She hit her head but did not lose consciousness.    In the ER patient, imaging noted bilateral subdural hematoma with mild shift.  Also showed right periorbital hematoma.  There was mild laceration on the right pupil area for which patient declined sutures. labs also showed anemia with severe thrombocytopenia.   -X-rays revealed right distal humerus and distal radius and ulnar styloid fracture.  On-call neurosurgeon Dr. Venetia Constable was consulted at this time requested admission for further observation and also to transfuse platelets because of the low platelets and bleed.  - For the fractures of the right upper extremity Dr. Percell Miller was consulted.    Assessment & Plan:  1.  Bilateral subdural hematoma with right periorbital hematoma - -Neurosurgery c/s - s/p 1 unit platelets given thrombocytopenia.  -No plans for surgery at this time.  -Recommend holding ASA x 1 week  2. Right upper extremity fractures   Comminuted Fracture Involving Greater Tuberosity of Humerous with suspected transverse fracture lucency at the R humeral neck   Distal Humerus Fracture   distal radius and ulnar styloid fracture  - Dr.Powell discussed with her Pulmonologist Dr.Youngrecommended to continue current care and consult Pulm if needed -remains stable from a pulm standpoint at this time  3.   COPD/chronic respiratory failure -No active wheezing at this time, add duo nebs as needed, encouraged incentive spirometer especially postoperatively -Encouraged importance of increasing activity as soon as possible -See discussions above  4.  Anemia and Severe chronic thrombocytopenia -long  Standing, ongoing for a number of years  transfused 1 unit of PRBC 4/29 and given 2 units in PACU 4/30 -also transfused 2 more units of PRBC in OR today, and given 1 unit of platelets -Etiology unclear, liver US not suggestive of cirrhosis, prior history of alcoholism quit 38 years ago, Hep B and C negative -d/w Oncologist dr.Gudena, he may consider bone marrow biopsy at follow-up  5.  History of breast cancer -On anastrozole  6.  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 Lindi Adie  8.Schizoaffective disorder  -on Zyprexa and citalopram.  DVT prophylaxis: SCDs only Code Status: dnr Family Communication: No family at bedside, called and updated son Dula Havlik and daughter-in-law  disposition Plan: pending   Consultants:   Ortho  neurosurgery  Procedures:   none  Antimicrobials:  Anti-infectives (From admission, onward)   None          Subjective: C/o pain to R arm  Objective: Vitals:   09/30/18 1350 09/30/18 1400 09/30/18 1404 09/30/18 1411  BP: 126/88  135/88   Pulse:  100  99  Resp:  (!) 28  (!) 24  Temp:    (!) 97.5 F (36.4 C)  TempSrc:      SpO2:  99%  99%  Weight:      Height:        Intake/Output Summary (Last 24 hours) at 09/30/2018 1502 Last data filed at 09/30/2018 1400 Gross per 24 hour  Intake 4201 ml  Output 900 ml  Net 3301 ml   [employer]  09/26/18 2012  Weight: 76.2 kg    Examination:  Gen: somnolent, just back from OR, arousable, answers questions HEENT: bruising around R eye Lungs: poor air movement, few basilar crackles CVS: S1S2/RRR Abd: soft, Non tender, non distended, BS present Extremities:R arm with dressing in sling Skin: no new rashes  Data Reviewed: I have personally reviewed following labs and imaging studies  CBC: Recent Labs  Lab 09/26/18 2049 09/27/18 0736 09/28/18 0314  09/28/18 0813 09/29/18 0349 09/29/18 1216 09/30/18 0443  WBC 9.8 11.0* 13.6*  --  15.0*  --  16.6*  NEUTROABS 5.9 9.1*  --   --  8.0*  --   --   HGB 9.7* 8.0* 7.0* 7.8* 6.7* 9.0* 8.1*  HCT 31.9* 25.5* 22.6* 26.0* 21.6* 27.3* 25.1*  MCV 90.6 88.2 90.4  --  90.4  --  88.1  PLT 38* 63* 50*  --  44*  --  88*   Basic Metabolic Panel: Recent Labs  Lab 09/26/18 2049 09/27/18 0736 09/27/18 1916 09/28/18 0314 09/29/18 0349 09/30/18 0443  NA 140 143 143  --  138 139  K 4.0 3.7 3.9  --  4.3 3.4*  CL 102 105 106  --  101 100  CO2 32 30 29  --  26 30  GLUCOSE 116* 199* 156*  --  123* 121*  BUN 14 12 9   --  8 6*  CREATININE 0.66 0.70 0.57  --  0.56 0.56  CALCIUM 9.0 8.7* 8.9  --  8.4* 8.6*  MG  --   --   --  2.1 2.0  --    GFR: Estimated Creatinine Clearance: 61.7 mL/min (by C-G formula based on SCr of 0.56 mg/dL). Liver Function Tests: Recent Labs  Lab 09/27/18 0736 09/27/18 1916 09/29/18 0349 09/30/18 0443  AST 37 24 29 18   ALT 22 20 14 15   ALKPHOS 65 59 63 61  BILITOT 0.8 0.6 1.3* 1.3*  PROT 5.9* 5.7* 5.9* 6.1*  ALBUMIN 3.5 3.5 3.4* 3.4*   No results for input(s): LIPASE, AMYLASE in the last 168 hours. No results for input(s): AMMONIA in the last 168 hours. Coagulation Profile: Recent Labs  Lab 09/26/18 2347  INR 1.1   Cardiac Enzymes: No results for input(s): CKTOTAL, CKMB, CKMBINDEX, TROPONINI in the last 168 hours. BNP (last 3 results) No results for input(s): PROBNP in the last 8760 hours. HbA1C: No results for input(s): HGBA1C in the last 72 hours. CBG: No results for input(s): GLUCAP in the last 168 hours. Lipid Profile: No results for input(s): CHOL, HDL, LDLCALC, TRIG, CHOLHDL, LDLDIRECT in the last 72 hours. Thyroid Function Tests: No results for input(s): TSH, T4TOTAL, FREET4, T3FREE, THYROIDAB in the last 72 hours. Anemia Panel: No results for input(s): VITAMINB12, FOLATE, FERRITIN, TIBC, IRON, RETICCTPCT in the last 72 hours. Sepsis Labs: No  results for input(s): PROCALCITON, LATICACIDVEN in the last 168 hours.  No results found for this or any previous visit (from the past 240 hour(s)).       Radiology Studies: Dg Shoulder Right  Result Date: 09/30/2018 CLINICAL DATA:  Status post right proximal humeral fracture EXAM: RIGHT SHOULDER - 2+ VIEW COMPARISON:  09/26/2018 FINDINGS: Redemonstrated is a minimally displaced fracture of the right greater tuberosity. Generalized osteopenia. No other fracture or dislocation. Mild arthropathy of the acromioclavicular joint. IMPRESSION: Redemonstrated is a minimally displaced fracture of the right greater tuberosity. Electronically Signed   By: Kathreen Devoid   On: 09/30/2018 12:57   Dg Humerus Right  Result Date: 09/30/2018 CLINICAL DATA:  Elbow fracture. EXAM: DG C-ARM 61-120 MIN; RIGHT HUMERUS - 2+ VIEW FLUOROSCOPY TIME:  20 seconds. COMPARISON:  Radiographs of September 26, 2018. FINDINGS: Two intraoperative fluoroscopic images were obtained of the right elbow. These images demonstrate surgical internal fixation of the distal right humeral fracture with overlying expected postoperative changes in the soft tissues. Improved alignment of fracture components is noted. IMPRESSION: Status post open reduction and internal fixation of distal right humeral fracture. Good alignment of fracture components is noted. Electronically Signed   By: Marijo Conception M.D.   On: 09/30/2018 09:55   Dg C-arm 1-60 Min  Result Date: 09/30/2018 CLINICAL DATA:  Elbow fracture. EXAM: DG C-ARM 61-120 MIN; RIGHT HUMERUS - 2+ VIEW FLUOROSCOPY TIME:  20 seconds. COMPARISON:  Radiographs of September 26, 2018. FINDINGS: Two intraoperative fluoroscopic images were obtained of the right elbow. These images demonstrate surgical internal fixation of the distal right humeral fracture with overlying expected postoperative changes in the soft tissues. Improved alignment of fracture components is noted. IMPRESSION: Status post open reduction  and internal fixation of distal right humeral fracture. Good alignment of fracture components is noted. Electronically Signed   By: Marijo Conception M.D.   On: 09/30/2018 09:55   US Abdomen Limited Ruq  Result Date: 09/29/2018 CLINICAL DATA:  Abnormal liver function tests. EXAM: ULTRASOUND ABDOMEN LIMITED RIGHT UPPER QUADRANT COMPARISON:  None. FINDINGS: Gallbladder: Status post cholecystectomy. Common bile duct: Diameter: 5 mm which is within normal limits. Liver: No focal lesion identified. Within normal limits in parenchymal echogenicity. Portal vein is patent on color Doppler imaging with normal direction of blood flow towards the liver. IMPRESSION: Status post cholecystectomy. No other abnormality seen in the right upper quadrant of the abdomen. Electronically Signed   By: Marijo Conception M.D.   On: 09/29/2018 15:58        Scheduled Meds:  sodium chloride   Intravenous Once   sodium chloride   Intravenous Once   acetaminophen  650 mg Oral Q6H   anastrozole  1 mg Oral Daily   atorvastatin  40 mg Oral Daily   cholestyramine  4 g Oral BID   citalopram  20 mg Oral Daily   furosemide  20 mg Intravenous Once   magnesium oxide  400 mg Oral Daily   OLANZapine  2.5 mg Oral QHS   omega-3 acid ethyl esters  1 g Oral Daily   potassium chloride  40 mEq Oral Once   Continuous Infusions:  vasopressin (PITRESSIN) infusion - *FOR SHOCK* Stopped (09/30/18 1124)     LOS: 3 days    Time spent: over 52mn    PDomenic Polite MD [facility]  09/30/2018, 3:02 PM

## 2018-09-30 NOTE — Telephone Encounter (Signed)
Scheduled appt per 4/30 sch message - unable to reach patient. Left message with appt date and time

## 2018-09-30 NOTE — Anesthesia Postprocedure Evaluation (Signed)
Anesthesia Post Note  Patient: Summer Williams  Procedure(s) Performed: OPEN REDUCTION INTERNAL FIXATION (ORIF) DISTAL HUMERUS FRACTURE (Right ) OPEN REDUCTION INTERNAL FIXATION (ORIF) WRIST FRACTURE (Right )     Patient location during evaluation: PACU Anesthesia Type: Regional and General Level of consciousness: awake and alert Pain management: pain level controlled Vital Signs Assessment: post-procedure vital signs reviewed and stable Respiratory status: spontaneous breathing, nonlabored ventilation, respiratory function stable and patient connected to nasal cannula oxygen Cardiovascular status: blood pressure returned to baseline and stable Postop Assessment: no apparent nausea or vomiting Anesthetic complications: no    Last Vitals:  Vitals:   09/30/18 1404 09/30/18 1411  BP: 135/88   Pulse:  99  Resp:  (!) 24  Temp:  (!) 36.4 C  SpO2:  99%    Last Pain:  Vitals:   09/30/18 1411  TempSrc:   PainSc: 0-No pain                 Barnet Glasgow

## 2018-09-30 NOTE — Transfer of Care (Signed)
Immediate Anesthesia Transfer of Care Note  Patient: Summer Williams  Procedure(s) Performed: OPEN REDUCTION INTERNAL FIXATION (ORIF) DISTAL HUMERUS FRACTURE (Right ) OPEN REDUCTION INTERNAL FIXATION (ORIF) WRIST FRACTURE (Right )  Patient Location: PACU  Anesthesia Type:General  Level of Consciousness: Patient remains intubated per anesthesia plan  Airway & Oxygen Therapy: Patient remains intubated per anesthesia plan and Patient placed on Ventilator (see vital sign flow sheet for setting)  Post-op Assessment: Report given to RN and Post -op Vital signs reviewed and stable  Post vital signs: Reviewed and stable  Last Vitals:  Vitals Value Taken Time  BP 129/80 09/30/2018 12:04 PM  Temp 36.4 C 09/30/2018 12:15 PM  Pulse 104 09/30/2018 12:15 PM  Resp 23 09/30/2018 12:17 PM  SpO2 100 % 09/30/2018 12:15 PM  Vitals shown include unvalidated device data.  Last Pain:  Vitals:   09/30/18 0600  TempSrc:   PainSc: 0-No pain         Complications: No apparent anesthesia complications

## 2018-09-30 NOTE — Progress Notes (Signed)
Pt following commands, able to lift head off bed.  Dr Broadus John here & extubated pt to 6L simple mask. VSS, O2 sats98 %.  Will continue to monitor.

## 2018-09-30 NOTE — Anesthesia Procedure Notes (Signed)
Procedure Name: Intubation Date/Time: 09/30/2018 7:55 AM Performed by: Scheryl Darter, CRNA Pre-anesthesia Checklist: Patient identified, Emergency Drugs available, Suction available and Patient being monitored Patient Re-evaluated:Patient Re-evaluated prior to induction Oxygen Delivery Method: Circle System Utilized Preoxygenation: Pre-oxygenation with 100% oxygen Induction Type: IV induction Ventilation: Mask ventilation without difficulty Laryngoscope Size: Glidescope and 3 Grade View: Grade I Tube type: Oral Tube size: 7.0 mm Number of attempts: 1 Airway Equipment and Method: Stylet and Oral airway Placement Confirmation: ETT inserted through vocal cords under direct vision,  positive ETCO2 and breath sounds checked- equal and bilateral Secured at: 22 cm Tube secured with: Tape Dental Injury: Teeth and Oropharynx as per pre-operative assessment  Difficulty Due To: Difficulty was anticipated, Difficult Airway- due to anterior larynx, Difficult Airway- due to limited oral opening, Difficult Airway- due to reduced neck mobility and Difficult Airway- due to large tongue

## 2018-09-30 NOTE — Progress Notes (Signed)
Pt adm to PACU with #2 PRBC infusing on blood warmer. Unit absorbed at 1310. No untoward rxn.

## 2018-09-30 NOTE — Op Note (Signed)
09/30/2018  11:30 AM  PATIENT:  Summer Williams    PRE-OPERATIVE DIAGNOSIS:  right distal humerus fracture  POST-OPERATIVE DIAGNOSIS:  Same  PROCEDURE:  OPEN REDUCTION INTERNAL FIXATION (ORIF) DISTAL HUMERUS FRACTURE, OPEN REDUCTION INTERNAL FIXATION (ORIF) WRIST FRACTURE  SURGEON:  Renette Butters, MD  ASSISTANT: Roxan Hockey, PA-C, he was present and scrubbed throughout the case, critical for completion in a timely fashion, and for retraction, instrumentation, and closure.   ANESTHESIA:   gen  PREOPERATIVE INDICATIONS:  Summer Williams is a  73 y.o. female with a diagnosis of right distal humerus fracture who failed conservative measures and elected for surgical management.    The risks benefits and alternatives were discussed with the patient preoperatively including but not limited to the risks of infection, bleeding, nerve injury, cardiopulmonary complications, the need for revision surgery, among others, and the patient was willing to proceed.  OPERATIVE IMPLANTS: DVR plate  OPERATIVE FINDINGS: unstable fractures  BLOOD LOSS: 557DU  COMPLICATIONS: none  TOURNIQUET TIME: 75 proximal arm, 56min forearm after 18min no tourniquet  OPERATIVE PROCEDURE:  Patient was identified in the preoperative holding area and site was marked by me She was transported to the operating theater and placed on the table in supine position taking care to pad all bony prominences. After a preincinduction time out anesthesia was induced. The right upper extremity was prepped and draped in normal sterile fashion and a pre-incision timeout was performed. She received ancef for preoperative antibiotics.  She was given IV TXA   She was initially placed in the lateral position using a mark to position or padding all bony prominences extra was placed  Arm was placed over the sure foot and made a posterior incision from her olecranon proximal of roughly 10 cm.  Initially identified her swollen  olecranon bursa I performed olecranon bursectomy here.  I then  I then dissected using spreading dissection medially and identified her ulnar nerve this was protected I performed a neuroplasty proximally and elevated the nerve and placed a soft vessel loop around the nerve.  I then performed performed a neuroplasty distally and elevated the nerve and released the cubital tunnel.  I was then able to comfortably place a K wire after reducing the fracture here under direct visualization.  Next I turned my attention laterally where I bluntly dissected around the triceps muscle intended and identified the fracture on the radial aspect of the supracondylar humeral region I was able to visualize an anatomic reduction here and placed a K wire provisionally fixing it in the place.  Next I pinned a posterior radial plate in the place and took x-rays I was happy with the alignment.  I reviewed fluoroscopic x-rays here was happy with the anatomic reduction of the fracture and the placement of the plate I placed distal locking screws after placing a nonlocking shaft screw I then placed a locking shaft screw and a nonlocking screw at the most proximal aspect of the fixation.  I turned my attention ulnarly where I had pinned in place and a plate in place and was happy with its location I placed a shaft nonlocking screw.  I then placed a transverse screw across the fracture piece and was very happy with the fixation here I then placed locking screws distally another one proximally and another nonlocking screw at the proximalmost hole.  I then reviewed 3 x-rays of her elbow was happy with the anatomic alignment of the fracture and placement of all hardware.  I then closed this incision in layers I left the cubital tunnel open to allow to limit compression of the ulnar nerve.  I closed the skin I placed a sterile dressing she was then repositioned into a supine position.  I made a 5 cm incision centered over her FCR tendon  and dissected down carefully to the level of the flexor tendon sheath and incise this longitudinally and retracted the FCR radially and incised the dorsal aspect of the sheath.   I bluntly dissected the FPL muscle belly away from the brachioradialis and then sharply incised the pronator tendon from the distal radius and from the wrist capsule. I Elevated this off the bone the fractures visible.   I released the brachioradialis from its insertion. I then debrided the fracture and performed a manual reduction.   I selected a plate and I placed it on the bone. I pinned it into place and was happy on multiple radiographic views with it's placement. I then fixed the plate distally with the locking pegs. I confirmed no articular penetration with the pegs and that none were prominent dorsally.   I then reduced the plate to the shaft improving the volar and radial tilt of her distal radius.  I was happy with the final fluoro xrays and reviewed 3 xrays of her wrist.    I thoroughly irrigated the wound and closed the pronator over top of the plate and then closed the skin in layers with absorbable stitch. Sterile dressing was applied using the PACU in stable condition.  POST OPERATIVE PLAN: NWB, Splint full time. Ambulate for DVT px.

## 2018-10-01 ENCOUNTER — Encounter (HOSPITAL_COMMUNITY): Payer: Self-pay | Admitting: Orthopedic Surgery

## 2018-10-01 LAB — BASIC METABOLIC PANEL
Anion gap: 12 (ref 5–15)
BUN: 6 mg/dL — ABNORMAL LOW (ref 8–23)
CO2: 27 mmol/L (ref 22–32)
Calcium: 8.6 mg/dL — ABNORMAL LOW (ref 8.9–10.3)
Chloride: 102 mmol/L (ref 98–111)
Creatinine, Ser: 0.59 mg/dL (ref 0.44–1.00)
GFR calc Af Amer: >60 mL/min (ref >60)
GFR calc non Af Amer: >60 mL/min (ref >60)
Glucose, Bld: 135 mg/dL — ABNORMAL HIGH (ref 70–99)
Potassium: 4 mmol/L (ref 3.5–5.1)
Sodium: 141 mmol/L (ref 135–145)

## 2018-10-01 LAB — TYPE AND SCREEN
ABO/RH(D): O POS
Antibody Screen: NEGATIVE
Unit division: 0
Unit division: 0

## 2018-10-01 LAB — BPAM RBC
Blood Product Expiration Date: 202005052359
Blood Product Expiration Date: 202005052359
ISSUE DATE / TIME: 202004301010
ISSUE DATE / TIME: 202004301010
Unit Type and Rh: 5100
Unit Type and Rh: 5100

## 2018-10-01 LAB — POCT I-STAT 4, (NA,K, GLUC, HGB,HCT)
Glucose, Bld: 116 mg/dL — ABNORMAL HIGH (ref 70–99)
HCT: 20 % — ABNORMAL LOW (ref 36.0–46.0)
Hemoglobin: 6.8 g/dL — CL (ref 12.0–15.0)
Potassium: 3.5 mmol/L (ref 3.5–5.1)
Sodium: 142 mmol/L (ref 135–145)

## 2018-10-01 LAB — BLOOD PRODUCT ORDER (VERBAL) VERIFICATION

## 2018-10-01 MED ORDER — DILTIAZEM HCL ER COATED BEADS 120 MG PO CP24
120.0000 mg | ORAL_CAPSULE | Freq: Every day | ORAL | Status: DC
  Administered 2018-10-01 – 2018-10-04 (×4): 120 mg via ORAL
  Filled 2018-10-01 (×4): qty 1

## 2018-10-01 NOTE — Care Management Important Message (Signed)
Important Message  Patient Details  Name: Summer Williams MRN: [id number] Date of Birth: [birth year]   Medicare Important Message Given:  Yes   Orbie Pyo 10/01/2018, 3:41 PM

## 2018-10-01 NOTE — Telephone Encounter (Signed)
Called and spoke with Patient's son, Summer Williams.  Summer Williams stated he hasn't had time to return Dr Janee Morn call. He stated Patient came through surgery well.  She is currently in a step down unit, sitting up, and talking.  She is on pain meds, but is doing very well.  Will route update to Dr Annamaria Boots

## 2018-10-01 NOTE — Progress Notes (Signed)
PROGRESS NOTE    Summer Williams  GMW:[id number] DOB: Mar 18, 1946 DOA: 09/26/2018 PCP: Elby Showers, MD   Brief Narrative:  Summer Williams is a 73/F with chronic respiratory failure, COPD on 3 L home oxygen, chronic thrombocytopenia, history of breast cancer, and lung cancer admitted following a fall at home after she tripped.  She hit her head but did not lose consciousness.    In the ER patient, imaging noted bilateral subdural hematoma with mild shift.  Also showed right periorbital hematoma.  There was mild laceration on the right pupil area for which patient declined sutures. labs also showed anemia with severe thrombocytopenia.   -X-rays revealed right distal humerus and distal radius and ulnar styloid fracture.  On-call neurosurgeon Dr. Venetia Constable was consulted at this time requested admission for further observation and also to transfuse platelets because of the low platelets and bleed.  - For the fractures of the right upper extremity Dr. Percell Miller was consulted.    Assessment & Plan:  1.  Bilateral subdural hematoma with right periorbital hematoma - -Neurosurgery c/s - s/p 1 unit platelets given thrombocytopenia.  -Recommended nonoperative management, holding ASA x 1 week -Stable from this standpoint  2. Right upper extremity fractures  Comminuted Fracture Involving Greater Tuberosity of Humerous with suspected transverse fracture lucency at the R humeral neck  Distal Humerus Fracture  distal radius and ulnar styloid fracture  -For right proximal humerus fracture -> nonoperative management with nonweightbearing and sling recommended  -For right distal humerus and distal radius fractures  -Underwent ORIF 4/30, nonweightbearing, sling and compressive dressings recommended  -Follow-up with orthopedics in 1 to 2 weeks, recommended to leave the dressing intact   3.   COPD/chronic respiratory failure -No active wheezing at this time, add duo nebs as needed, encouraged incentive  spirometer especially postoperatively -Stable from the standpoint weaned back to 3 L which is her baseline O2 requirement  4.  Anemia and Severe chronic thrombocytopenia -long Standing, ongoing for a number of years  transfused 1 unit of PRBC 4/29 and given 2 units in PACU 4/30 -also transfused 2 more units of PRBC in OR yesterday, and given 1 unit of platelets -Etiology unclear, liver US not suggestive of cirrhosis, prior history of alcoholism quit 38 years ago, Hep B and C negative -d/w Oncologist dr.Gudena, he may consider bone marrow biopsy at follow-up  5.  History of breast cancer -On anastrozole  6.  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 Lindi Adie  7.Schizoaffective disorder  -on Zyprexa and citalopram.  DVT prophylaxis: SCDs Code Status: DNR Family Communication: No family at bedside, called and updated son Summer Williams and daughter-in-law  disposition Plan: home in 1-2days if stable   Consultants:   Ortho  neurosurgery  Procedures:   none  Antimicrobials:  Anti-infectives (From admission, onward)   Start     Dose/Rate Route Frequency Ordered Stop   09/30/18 1600  ceFAZolin (ANCEF) IVPB 2g/100 mL premix     2 g 200 mL/hr over 30 Minutes Intravenous Every 6 hours 09/30/18 1506 10/01/18 0534          Subjective: -c/o some discomfort in the right arm, otherwise feels okay  Objective: Vitals:   09/30/18 2321 10/01/18 0311 10/01/18 0751 10/01/18 1305  BP: (!) 142/58 132/76 (!) 121/50 98/77  Pulse: (!) 103 89 95 (!) 113  Resp: (!) 22 (!) 24 (!) 32  Temp: 99.2 F (37.3 C) 97.7 F (36.5 C) 98.9 F (37.2 C) 99 F (37.2 C)  TempSrc: Oral Oral Oral Oral  SpO2: 99% 99% 98% 97%  Weight:      Height:        Intake/Output Summary (Last 24 hours) at 10/01/2018 1439 Last data filed at 10/01/2018 0605 Gross per 24 hour  Intake  560 ml  Output 2350 ml  Net -1790 ml   Filed Weights   09/26/18 2012  Weight: 76.2 kg    Examination:  Gen: Awake, Alert, Oriented X 3, no distress HEENT: Bruising around right eye Lungs: Good air movement, otherwise clear, no wheezes CVS: RRR,No Gallops,Rubs or new Murmurs Abd: soft, Non tender, non distended, BS present Extremities: No edema, right arm with dressing in the sling Skin: no new rashes  Data Reviewed: I have personally reviewed following labs and imaging studies  CBC: Recent Labs  Lab 09/26/18 2049 09/27/18 0736 09/28/18 0314  09/29/18 0349 09/29/18 1216 09/30/18 0443 09/30/18 0952 10/01/18 0258  WBC 9.8 11.0* 13.6*  --  15.0*  --  16.6*  --  18.6*  NEUTROABS 5.9 9.1*  --   --  8.0*  --   --   --   --   HGB 9.7* 8.0* 7.0*   < > 6.7* 9.0* 8.1* 6.8* 9.9*  HCT 31.9* 25.5* 22.6*   < > 21.6* 27.3* 25.1* 20.0* 29.9*  MCV 90.6 88.2 90.4  --  90.4  --  88.1  --  87.9  PLT 38* 63* 50*  --  44*  --  88*  --  71*   < > = values in this interval not displayed.   Basic Metabolic Panel: Recent Labs  Lab 09/27/18 0736 09/27/18 1916 09/28/18 0314 09/29/18 0349 09/30/18 0443 09/30/18 0952 10/01/18 0258  NA 143 143  --  138 139 142 141  K 3.7 3.9  --  4.3 3.4* 3.5 4.0  CL 105 106  --  101 100  --  102  CO2 30 29  --  26 30  --  27  GLUCOSE 199* 156*  --  123* 121* 116* 135*  BUN 12 9  --  8 6*  --  6*  CREATININE 0.70 0.57  --  0.56 0.56  --  0.59  CALCIUM 8.7* 8.9  --  8.4* 8.6*  --  8.6*  MG  --   --  2.1 2.0  --   --   --    GFR: Estimated Creatinine Clearance: 61.7 mL/min (by C-G formula based on SCr of 0.59 mg/dL). Liver Function Tests: Recent Labs  Lab 09/27/18 0736 09/27/18 1916 09/29/18 0349 09/30/18 0443  AST 37 _0 ALT _1 ALKPHOS 65 59 63 61  BILITOT 0.8 0.6 1.3* 1.3*  PROT 5.9* 5.7* 5.9* 6.1*  ALBUMIN 3.5 3.5 3.4* 3.4*   No results for input(s): LIPASE, AMYLASE in the last 168 hours. No results for input(s): AMMONIA  in the last 168 hours. Coagulation Profile: Recent Labs  Lab 09/26/18 2347  INR 1.1   Cardiac Enzymes: No results for input(s): CKTOTAL, CKMB, CKMBINDEX, TROPONINI in the last 168 hours. BNP (last 3 results) No results for input(s): PROBNP in the last 8760 hours. HbA1C: No results for input(s): HGBA1C in the last 72 hours. CBG: No results for input(s): GLUCAP in the last 168 hours. Lipid Profile: No results for input(s): CHOL, HDL, LDLCALC, TRIG, CHOLHDL, LDLDIRECT in the last  72 hours. Thyroid Function Tests: No results for input(s): TSH, T4TOTAL, FREET4, T3FREE, THYROIDAB in the last 72 hours. Anemia Panel: Recent Labs    09/30/18 1606  VITAMINB12 324   Sepsis Labs: No results for input(s): PROCALCITON, LATICACIDVEN in the last 168 hours.  No results found for this or any previous visit (from the past 240 hour(s)).       Radiology Studies: Dg Shoulder Right  Result Date: 09/30/2018 CLINICAL DATA:  Status post right proximal humeral fracture EXAM: RIGHT SHOULDER - 2+ VIEW COMPARISON:  09/26/2018 FINDINGS: Redemonstrated is a minimally displaced fracture of the right greater tuberosity. Generalized osteopenia. No other fracture or dislocation. Mild arthropathy of the acromioclavicular joint. IMPRESSION: Redemonstrated is a minimally displaced fracture of the right greater tuberosity. Electronically Signed   By: Kathreen Devoid   On: 09/30/2018 12:57   Dg Humerus Right  Result Date: 09/30/2018 CLINICAL DATA:  Elbow fracture. EXAM: DG C-ARM 61-120 MIN; RIGHT HUMERUS - 2+ VIEW FLUOROSCOPY TIME:  20 seconds. COMPARISON:  Radiographs of September 26, 2018. FINDINGS: Two intraoperative fluoroscopic images were obtained of the right elbow. These images demonstrate surgical internal fixation of the distal right humeral fracture with overlying expected postoperative changes in the soft tissues. Improved alignment of fracture components is noted. IMPRESSION: Status post open reduction and  internal fixation of distal right humeral fracture. Good alignment of fracture components is noted. Electronically Signed   By: Marijo Conception M.D.   On: 09/30/2018 09:55   Dg C-arm 1-60 Min  Result Date: 09/30/2018 CLINICAL DATA:  Elbow fracture. EXAM: DG C-ARM 61-120 MIN; RIGHT HUMERUS - 2+ VIEW FLUOROSCOPY TIME:  20 seconds. COMPARISON:  Radiographs of September 26, 2018. FINDINGS: Two intraoperative fluoroscopic images were obtained of the right elbow. These images demonstrate surgical internal fixation of the distal right humeral fracture with overlying expected postoperative changes in the soft tissues. Improved alignment of fracture components is noted. IMPRESSION: Status post open reduction and internal fixation of distal right humeral fracture. Good alignment of fracture components is noted. Electronically Signed   By: Marijo Conception M.D.   On: 09/30/2018 09:55   US Abdomen Limited Ruq  Result Date: 09/29/2018 CLINICAL DATA:  Abnormal liver function tests. EXAM: ULTRASOUND ABDOMEN LIMITED RIGHT UPPER QUADRANT COMPARISON:  None. FINDINGS: Gallbladder: Status post cholecystectomy. Common bile duct: Diameter: 5 mm which is within normal limits. Liver: No focal lesion identified. Within normal limits in parenchymal echogenicity. Portal vein is patent on color Doppler imaging with normal direction of blood flow towards the liver. IMPRESSION: Status post cholecystectomy. No other abnormality seen in the right upper quadrant of the abdomen. Electronically Signed   By: Marijo Conception M.D.   On: 09/29/2018 15:58        Scheduled Meds: . sodium chloride   Intravenous Once  . acetaminophen  650 mg Oral Q6H  . anastrozole  1 mg Oral Daily  . atorvastatin  40 mg Oral Daily  . cholestyramine  4 g Oral BID  . citalopram  20 mg Oral Daily  . diltiazem  120 mg Oral Daily  . docusate sodium  100 mg Oral BID  . magnesium oxide  400 mg Oral Daily  . OLANZapine  2.5 mg Oral QHS   Continuous Infusions:     LOS: 4 days    Time spent: over 7mn    PDomenic Polite MD [facility]  10/01/2018, 2:39 PM

## 2018-10-01 NOTE — Evaluation (Signed)
Physical Therapy Evaluation Patient Details Name: Summer Williams MRN: [id number] DOB: August 27, 1945 Today's Date: 10/01/2018   History of Present Illness  Summer Williams is a 73 y.o. female with history of COPD, hyperlipidemia, breast cancer, lung cancer, Schizoaffective disorder, thrombocytopenia had a fall at home after she tripped.  She hit her head but did not lose consciousness. Found to have right prox humerus fx --non-operative. Right distal humerus fx -ORIF 4/30.Right wrist fx--non-operative management. Bilateral subdural hematoma with right periorbital hematoma     Clinical Impression  Pt presented sitting OOB in recliner chair, awake and willing to participate in therapy session. Prior to admission, pt reported that she was independent with all functional mobility and ADLs. Pt lives alone in a single level home with ten steps to enter (railing on R). Pt will have family available to provide 24/7 supervision/assistance upon d/c. At the time of evaluation, pt very limited secondary to generalized weakness and poor cardiopulmonary tolerance. Pt required min A for transfers and min guard to ambulate a very short distance within her room. Pt on 3L of O2 throughout with SPO2 maintaining >94%. However, HR quickly elevating to 130 bpm from a resting HR of low 100's. Pt with reported difficulty breathing, requiring standing rest breaks and limited activity. Pt would continue to benefit from skilled physical therapy services at this time while admitted and after d/c to address the below listed limitations in order to improve overall safety and independence with functional mobility.     Follow Up Recommendations Home health PT;Supervision/Assistance - 24 hour    Equipment Recommendations  None recommended by PT    Recommendations for Other Services       Precautions / Restrictions Precautions Precautions: Fall Restrictions Weight Bearing Restrictions: Yes RUE Weight Bearing: Non weight  bearing Other Position/Activity Restrictions: Sling and gentle motion ok      Mobility  Bed Mobility               General bed mobility comments: pt OOB in recliner chair upon arrival  Transfers Overall transfer level: Needs assistance Equipment used: 1 person hand held assist Transfers: Sit to/from Stand Sit to Stand: Min assist         General transfer comment: min A for stability and safety with transition into standing from recliner chair; pt able to control descent without use of UEs  Ambulation/Gait Ambulation/Gait assistance: Min guard Gait Distance (Feet): 20 Feet Assistive device: None Gait Pattern/deviations: Step-to pattern;Step-through pattern;Decreased step length - right;Decreased step length - left;Decreased stride length;Shuffle;Trunk flexed Gait velocity: decreased   General Gait Details: pt with very slow, cautious, shuffling gait pattern without use of an AD or UE supports; pt very limited secondary to fatigue and SOB (pt on 3L of O2 throughout with SPO2 maintaining in mid to high 90's)  Stairs            Wheelchair Mobility    Modified Rankin (Stroke Patients Only)       Balance Overall balance assessment: Needs assistance Sitting-balance support: No upper extremity supported;Feet supported Sitting balance-Leahy Scale: Fair     Standing balance support: No upper extremity supported Standing balance-Leahy Scale: Poor                               Pertinent Vitals/Pain Pain Assessment: No/denies pain    Home Living Family/patient expects to be discharged to:: Private residence Living Arrangements: Alone Available Help at Discharge: Family;Available  24 hours/day Type of Home: House Home Access: Stairs to enter Entrance Stairs-Rails: Right Entrance Stairs-Number of Steps: 10 Home Layout: One level Home Equipment: None      Prior Function Level of Independence: Independent               Hand Dominance    Dominant Hand: Right    Extremity/Trunk Assessment   Upper Extremity Assessment Upper Extremity Assessment: Defer to OT evaluation    Lower Extremity Assessment Lower Extremity Assessment: Generalized weakness       Communication   Communication: No difficulties  Cognition Arousal/Alertness: Awake/alert Behavior During Therapy: WFL for tasks assessed/performed Overall Cognitive Status: Within Functional Limits for tasks assessed                                        General Comments      Exercises     Assessment/Plan    PT Assessment Patient needs continued PT services  PT Problem List Decreased strength;Decreased range of motion;Decreased balance;Decreased activity tolerance;Decreased mobility;Decreased coordination;Decreased knowledge of use of DME;Decreased safety awareness;Decreased knowledge of precautions       PT Treatment Interventions DME instruction;Gait training;Stair training;Functional mobility training;Therapeutic activities;Therapeutic exercise;Balance training;Neuromuscular re-education;Patient/family education    PT Goals (Current goals can be found in the Care Plan section)  Acute Rehab PT Goals Patient Stated Goal: decrease pain, return to PLOF PT Goal Formulation: With patient Time For Goal Achievement: 10/15/18 Potential to Achieve Goals: Good    Frequency Min 3X/week   Barriers to discharge        Co-evaluation               AM-PAC PT "6 Clicks" Mobility  Outcome Measure Help needed turning from your back to your side while in a flat bed without using bedrails?: A Little Help needed moving from lying on your back to sitting on the side of a flat bed without using bedrails?: A Little Help needed moving to and from a bed to a chair (including a wheelchair)?: A Little Help needed standing up from a chair using your arms (e.g., wheelchair or bedside chair)?: A Little Help needed to walk in hospital room?: A Little Help  needed climbing 3-5 steps with a railing? : A Lot 6 Click Score: 17    End of Session Equipment Utilized During Treatment: Gait belt Activity Tolerance: Patient limited by fatigue Patient left: in chair;with call bell/phone within reach;with chair alarm set Nurse Communication: Mobility status PT Visit Diagnosis: Other abnormalities of gait and mobility (R26.89);Muscle weakness (generalized) (M62.81)    Time: 6808-8110 PT Time Calculation (min) (ACUTE ONLY): 24 min   Charges:   PT Evaluation $PT Eval Moderate Complexity: 1 Mod PT Treatments $Therapeutic Activity: 8-22 mins        Sherie Don, PT, DPT  Acute Rehabilitation Services Pager [phone] Office [place] 10/01/2018, 3:01 PM

## 2018-10-01 NOTE — Progress Notes (Addendum)
    Subjective: Patient reports pain as moderate.   No CP, SOB.   She hopes to go back home and not a nursing facility if possible.   Not yet mobilized.  Objective:   VITALS:   Vitals:   09/30/18 1832 09/30/18 2038 09/30/18 2321 10/01/18 0311  BP: 126/75 136/79 (!) 142/58 132/76  Pulse: (!) 103 (!) 105 (!) 103 89  Resp: (!) 24 (!) 35 (!) 22 (!) 24  Temp:  99.7 F (37.6 C) 99.2 F (37.3 C) 97.7 F (36.5 C)  TempSrc:  Oral Oral Oral  SpO2: 99% 95% 99% 99%  Weight:      Height:       CBC Latest Ref Rng & Units 10/01/2018 09/30/2018 09/30/2018  WBC 4.0 - 10.5 K/uL 18.6(H) - 16.6(H)  Hemoglobin 12.0 - 15.0 g/dL 9.9(L) 6.8(LL) 8.1(L)  Hematocrit 36.0 - 46.0 % 29.9(L) 20.0(L) 25.1(L)  Platelets 150 - 400 K/uL 71(L) - 88(L)   BMP Latest Ref Rng & Units 10/01/2018 09/30/2018 09/30/2018  Glucose 70 - 99 mg/dL 135(H) 116(H) 121(H)  BUN 8 - 23 mg/dL 6(L) - 6(L)  Creatinine 0.44 - 1.00 mg/dL 0.59 - 0.56  Sodium 135 - 145 mmol/L 141 142 139  Potassium 3.5 - 5.1 mmol/L 4.0 3.5 3.4(L)  Chloride 98 - 111 mmol/L 102 - 100  CO2 22 - 32 mmol/L 27 - 30  Calcium 8.9 - 10.3 mg/dL 8.6(L) - 8.6(L)   Intake/Output      04/30 0701 - 05/01 0700 05/01 0701 - 05/02 0700   P.O. 360    I.V. (mL/kg) 1700 (22.3)    Blood 566    Other 350    IV Piggyback 950    Total Intake(mL/kg) 3926 (51.5)    Urine (mL/kg/hr) 2350 (1.3)    Blood 300    Total Output 2650    Net +1276         Urine Occurrence 1 x       Physical Exam: General: NAD.  Supine in bed.  Calm, conversant.  No increased work of breathing.  Ralls in place. MSK RUE: Hand warm.  Moderate expected swelling. Sensation intact distally.  Motor function intact all fingers. Incision: Ace wrap and dressing C/D/I   Assessment / Plan: 1 Day Post-Op  S/P Procedure(s) (LRB): OPEN REDUCTION INTERNAL FIXATION (ORIF) DISTAL HUMERUS FRACTURE (Right) OPEN REDUCTION INTERNAL FIXATION (ORIF) WRIST FRACTURE (Right) by Dr. Ernesta Amble. Percell Miller on 09/30/2018   Principal Problem:   Subdural hematoma ([place]) Active Problems:   Schizoaffective disorder ([facility])   COPD exacerbation ([place])   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 ([place])   DNR (do not resuscitate)   Right proximal humerus fracture Nonoperative management Nonweightbearing Maintain sling  Right distal humerus and Right distal radius fractures Status post ORIF 09/30/18. Nonweightbearing Maintain sling and compressive dressings. Incentive Spirometry Elevate and Apply ice  Weightbearing: NWB RUE Insicional and dressing care: Dressings left intact until follow-up Orthopedic device(s): Sling Showering: Keep dressing dry VTE prophylaxis: Held.  Defer to primary and neurosurgery due to SDH.  Mobilize.  No long-term anticoagulation needed from an orthopedic perspective for these upper extremity injuries. Follow - up plan: Follow up in the office with Dr. Alain Marion in 1-2 weeks.  Please call with questions.  Contact information:  Edmonia Lynch MD, Roxan Hockey PA-C   Charna Elizabeth Martensen III, PA-C 10/01/2018, 7:50 AM

## 2018-10-01 NOTE — Evaluation (Signed)
Occupational Therapy Evaluation Patient Details Name: Summer Williams MRN: [id number] DOB: [birth year] Today's Date: 10/01/2018    History of Present Illness Summer Williams is a 73 y.o. female with history of COPD, hyperlipidemia, breast cancer, lung cancer, Schizoaffective disorder, thrombocytopenia had a fall at home after she tripped.  She hit her head but did not lose consciousness. Found to have right prox humerus fx --non-operative. Right distal humerus fx -ORIF 4/30.Right wrist fx--non-operative management. Bilateral subdural hematoma with right periorbital hematoma    Clinical Impression   This 73 yo female admitted and underwent above presents to acute OT with decreased balance, non-functional use of RUE at this time due to casting, and decreased pain all affecting her PLOF of being independent with basic ADLs. She will benefit from acute OT with follow up [place], [place], and 24 hour S/prn A.    Follow Up Recommendations  Home health OT;Supervision/Assistance - 24 hour;Other (comment)([facility])    Equipment Recommendations  3 in 1 bedside commode       Precautions / Restrictions Precautions Precautions: Fall Required Braces or Orthoses: Sling Restrictions Weight Bearing Restrictions: Yes RUE Weight Bearing: Non weight bearing Other Position/Activity Restrictions: Sling and gentle motion ok      Mobility Bed Mobility Overal bed mobility: Needs Assistance Bed Mobility: Supine to Sit     Supine to sit: HOB elevated;Min assist     General bed mobility comments: use of rail and increased time  Transfers Overall transfer level: Needs assistance Equipment used: 1 person hand held assist Transfers: Sit to/from [facility] Sit to Stand: Min assist Stand pivot transfers: Min assist       General transfer comment: increased time    Balance Overall balance assessment: Needs assistance Sitting-balance support: No upper extremity supported;Feet  supported Sitting balance-Leahy Scale: Fair     Standing balance support: Single extremity supported Standing balance-Leahy Scale: Poor                             ADL either performed or assessed with clinical judgement   ADL Overall ADL's : Needs assistance/impaired Eating/Feeding: Set up;Sitting   Grooming: Moderate assistance;Sitting   Upper Body Bathing: Moderate assistance;Sitting   Lower Body Bathing: Maximal assistance Lower Body Bathing Details (indicate cue type and reason): min A sit<>stand Upper Body Dressing : Maximal assistance;Sitting   Lower Body Dressing: Total assistance Lower Body Dressing Details (indicate cue type and reason): min A sit<>stand Toilet Transfer: Minimal assistance;Stand-pivot Toilet Transfer Details (indicate cue type and reason): bed>recliner Toileting- Clothing Manipulation and Hygiene: Maximal assistance Toileting - Clothing Manipulation Details (indicate cue type and reason): min A sit<>stand             Vision Baseline Vision/History: Wears glasses(has glasses her but right arm of them is bent) Wears Glasses: At all times Patient Visual Report: No change from baseline(can now get right eye open (intially swollen shut per pt))              Pertinent Vitals/Pain Pain Assessment: 0-10 Pain Score: 4  Pain Location: right elbow Pain Descriptors / Indicators: Aching;Sore Pain Intervention(s): Limited activity within patient's tolerance;Monitored during session;Repositioned     Hand Dominance Right   Extremity/Trunk Assessment Upper Extremity Assessment Upper Extremity Assessment: RUE deficits/detail RUE Deficits / Details: good movment in fingers within constraints of casting/splinting (there is edema) Tolerating gentle AROM/AAROM of digits as well as at shoulder RUE Sensation: WNL RUE Coordination: decreased gross  motor           Communication Communication Communication: No difficulties   Cognition  Arousal/Alertness: Awake/alert Behavior During Therapy: WFL for tasks assessed/performed Overall Cognitive Status: Within Functional Limits for tasks assessed                                                Home Living Family/patient expects to be discharged to:: Private residence Living Arrangements: Alone Available Help at Discharge: Family;Available 24 hours/day Type of Home: House Home Access: Stairs to enter [employer] of Steps: 10 Entrance Stairs-Rails: Right Home Layout: One level     Bathroom Shower/Tub: Teacher, early years/pre: Standard     Home Equipment: Other (comment)(O2 at 3 liters)   Additional Comments: Drives      Prior Functioning/Environment Level of Independence: Independent                 OT Problem List: Decreased strength;Decreased range of motion;Decreased activity tolerance;Impaired balance (sitting and/or standing);Decreased coordination;Decreased safety awareness;Pain;Impaired UE functional use;Increased edema      OT Treatment/Interventions: Self-care/ADL training;Balance training;DME and/or AE instruction;Patient/family education;Therapeutic exercise    OT Goals(Current goals can be found in the care plan section) Acute Rehab OT Goals Patient Stated Goal: to not be a burden on her family but willing to go home and let them take care of her OT Goal Formulation: With patient/family Time For Goal Achievement: 10/15/18 Potential to Achieve Goals: Good  OT Frequency: Min 3X/week              AM-PAC OT "6 Clicks" Daily Activity     Outcome Measure Help from another person eating meals?: A Little Help from another person taking care of personal grooming?: A Lot Help from another person toileting, which includes using toliet, bedpan, or urinal?: A Lot Help from another person bathing (including washing, rinsing, drying)?: A Lot Help from another person to put on and taking off regular upper body  clothing?: A Lot Help from another person to put on and taking off regular lower body clothing?: A Lot 6 Click Score: 13   End of Session Equipment Utilized During Treatment: Gait belt;Oxygen(3 liters) Nurse Communication: Mobility status(family wants update once MDs see her)  Activity Tolerance: Patient tolerated treatment well Patient left: in chair;with call bell/phone within reach;with chair alarm set;Other (comment)(with RUE propped so hand not in a dependent position)  OT Visit Diagnosis: Unsteadiness on feet (R26.81);Other abnormalities of gait and mobility (R26.89);Muscle weakness (generalized) (M62.81);History of falling (Z91.81);Pain Pain - Right/Left: Right Pain - part of body: Arm                Time: 2876-8115 OT Time Calculation (min): 52 min Charges:  OT General Charges $OT Visit: 1 Visit OT Evaluation $OT Eval Moderate Complexity: 1 Mod OT Treatments $Self Care/Home Management : 23-37 mins  [address] [address], OTR/L Acute [employer] Pager [phone] Office [phone]     Almon Register 10/01/2018, 9:48 AM

## 2018-10-02 LAB — CBC
HCT: 29.9 % — ABNORMAL LOW (ref 36.0–46.0)
Hemoglobin: 9.9 g/dL — ABNORMAL LOW (ref 12.0–15.0)
MCH: 29.1 pg (ref 26.0–34.0)
MCHC: 33.1 g/dL (ref 30.0–36.0)
MCV: 87.9 fL (ref 80.0–100.0)
Platelets: 71 10*3/uL — ABNORMAL LOW (ref 150–400)
RBC: 3.4 MIL/uL — ABNORMAL LOW (ref 3.87–5.11)
RDW: 14.8 % (ref 11.5–15.5)
WBC: 18.6 10*3/uL — ABNORMAL HIGH (ref 4.0–10.5)
nRBC: 0 % (ref 0.0–0.2)

## 2018-10-02 MED ORDER — SODIUM CHLORIDE 0.9 % IV SOLN
INTRAVENOUS | Status: DC
  Administered 2018-10-02: 04:00:00 via INTRAVENOUS

## 2018-10-02 NOTE — Progress Notes (Signed)
PROGRESS NOTE    MAKALYA Williams  ZOX:[id number] DOB: [birth year] DOA: 09/26/2018 PCP: Elby Showers, MD   Brief Narrative:  Summer Williams is a 73/F with chronic respiratory failure, COPD on 3 L home oxygen, chronic thrombocytopenia, history of breast cancer, and lung cancer admitted following a fall at home after she tripped.  She hit her head but did not lose consciousness.    In the ER patient, imaging noted bilateral subdural hematoma with mild shift.  Also showed right periorbital hematoma.  There was mild laceration on the right pupil area for which patient declined sutures. labs also showed anemia with severe thrombocytopenia.   -X-rays revealed right distal humerus and distal radius and ulnar styloid fracture.  On-call neurosurgeon Dr. Venetia Constable was consulted at this time requested admission for further observation and also to transfuse platelets because of the low platelets and bleed.  - For the fractures of the right upper extremity Dr. Percell Miller was consulted.    Assessment & Plan:  1.  Bilateral subdural hematoma with right periorbital hematoma - -Neurosurgery c/s - s/p 1 unit platelets given thrombocytopenia.  -Recommended nonoperative management, holding ASA x 1 week -Stable from this standpoint  2. Right upper extremity fractures  Comminuted Fracture Involving Greater Tuberosity of Humerous with suspected transverse fracture lucency at the R humeral neck  Distal Humerus Fracture  distal radius and ulnar styloid fracture  -Right proximal humerus fracture -> nonoperative management with nonweightbearing and sling recommended  -Right distal humerus and distal radius fractures ->Underwent ORIF 4/30, nonweightbearing, sling and compressive dressings recommended  -Follow-up with orthopedics in 1 to 2 weeks, recommended to leave the dressing intact   3.   COPD/chronic respiratory failure -Stable from the standpoint, white count has gone up a little bit, I suspect this is  reactive -She is afebrile and nontoxic, continue incentive spirometer, flutter valve -Duo nebs as needed, no expiratory wheezing noted  4.  Anemia and Severe chronic thrombocytopenia -long Standing, ongoing for a number of years  transfused 1 unit of PRBC 4/29 and given 2 units in PACU 4/30 -also transfused 2 more units of PRBC in OR yesterday, and given 1 unit of platelets -Etiology unclear, liver US not suggestive of cirrhosis, prior history of alcoholism quit 38 years ago, Hep B and C negative -Discussed with oncologist Dr. Sonny Dandy he may consider bone marrow aspiration biopsy down the road  5.  History of breast cancer -On anastrozole  6.  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 Lindi Adie  7.Schizoaffective disorder  -on Zyprexa and citalopram.  DVT prophylaxis: SCDs Code Status: DNR Family Communication: No family at bedside, called and updated son Summer Williams 5/1 disposition Plan: Home tomorrow with home health services if stable   Consultants:   Ortho  neurosurgery  Procedures:   none  Antimicrobials:  Anti-infectives (From admission, onward)   Start     Dose/Rate Route Frequency Ordered Stop   09/30/18 1600  ceFAZolin (ANCEF) IVPB 2g/100 mL premix     2 g 200 mL/hr over 30 Minutes Intravenous Every 6 hours 09/30/18 1506 10/01/18 0534          Subjective: -Multiple complaints this morning, reports pain in her right arm, headache, nausea, weakness and cough  Objective: Vitals:   10/01/18 2034 10/01/18 2344 10/02/18 0400 10/02/18 0844  BP: (!) 109/59 102/90 (!) 143/67 130/63  Pulse: 92 (!) 105  89 (!) 107  Resp: 18 (!) 26 (!) 21 (!) 26  Temp: 99.5 F (37.5 C) 98.9 F (37.2 C) 99.1 F (37.3 C) 99 F (37.2 C)  TempSrc: Oral Axillary Oral Oral  SpO2: 97% 99% 100% 97%  Weight:      Height:        Intake/Output Summary  (Last 24 hours) at 10/02/2018 1442 Last data filed at 10/02/2018 0502 Gross per 24 hour  Intake -  Output 175 ml  Net -175 ml   Filed Weights   09/26/18 2012  Weight: 76.2 kg    Examination:  Gen: Orbitally obese chronically ill-appearing female, sitting up in bed, no distress awake, Alert, Oriented X 3,  HEENT: Right eye with bruising around it Lungs: Poor air movement bilaterally, no expiratory wheezes few scattered rhonchi CVS: RRR,No Gallops,Rubs or new Murmurs Abd: soft, Non tender, non distended, BS present Extremities: Right arm with dressing in a sling  no edema Skin: no new rashes  Data Reviewed: I have personally reviewed following labs and imaging studies  CBC: Recent Labs  Lab 09/26/18 2049 09/27/18 0736 09/28/18 0314  09/29/18 0349 09/29/18 1216 09/30/18 0443 09/30/18 0952 10/01/18 0258  WBC 9.8 11.0* 13.6*  --  15.0*  --  16.6*  --  18.6*  NEUTROABS 5.9 9.1*  --   --  8.0*  --   --   --   --   HGB 9.7* 8.0* 7.0*   < > 6.7* 9.0* 8.1* 6.8* 9.9*  HCT 31.9* 25.5* 22.6*   < > 21.6* 27.3* 25.1* 20.0* 29.9*  MCV 90.6 88.2 90.4  --  90.4  --  88.1  --  87.9  PLT 38* 63* 50*  --  44*  --  88*  --  71*   < > = values in this interval not displayed.   Basic Metabolic Panel: Recent Labs  Lab 09/27/18 0736 09/27/18 1916 09/28/18 0314 09/29/18 0349 09/30/18 0443 09/30/18 0952 10/01/18 0258  NA 143 143  --  138 139 142 141  K 3.7 3.9  --  4.3 3.4* 3.5 4.0  CL 105 106  --  101 100  --  102  CO2 30 29  --  26 30  --  27  GLUCOSE 199* 156*  --  123* 121* 116* 135*  BUN 12 9  --  8 6*  --  6*  CREATININE 0.70 0.57  --  0.56 0.56  --  0.59  CALCIUM 8.7* 8.9  --  8.4* 8.6*  --  8.6*  MG  --   --  2.1 2.0  --   --   --    GFR: Estimated Creatinine Clearance: 61.7 mL/min (by C-G formula based on SCr of 0.59 mg/dL). Liver Function Tests: Recent Labs  Lab 09/27/18 0736 09/27/18 1916 09/29/18 0349 09/30/18 0443  AST 37 24 29 18   ALT 22 20 14 15   ALKPHOS 65  59 63 61  BILITOT 0.8 0.6 1.3* 1.3*  PROT 5.9* 5.7* 5.9* 6.1*  ALBUMIN 3.5 3.5 3.4* 3.4*   No results for input(s): LIPASE, AMYLASE in the last 168 hours. No results for input(s): AMMONIA in the last 168 hours. Coagulation Profile: Recent Labs  Lab 09/26/18 2347  INR 1.1   Cardiac Enzymes: No results for input(s): CKTOTAL, CKMB, CKMBINDEX, TROPONINI in the last 168 hours. BNP (last 3 results) No results for input(s): PROBNP in the last 8760 hours. HbA1C: No results for input(s): HGBA1C in the last  72 hours. CBG: No results for input(s): GLUCAP in the last 168 hours. Lipid Profile: No results for input(s): CHOL, HDL, LDLCALC, TRIG, CHOLHDL, LDLDIRECT in the last 72 hours. Thyroid Function Tests: No results for input(s): TSH, T4TOTAL, FREET4, T3FREE, THYROIDAB in the last 72 hours. Anemia Panel: Recent Labs    09/30/18 1606  VITAMINB12 324   Sepsis Labs: No results for input(s): PROCALCITON, LATICACIDVEN in the last 168 hours.  No results found for this or any previous visit (from the past 240 hour(s)).       Radiology Studies: No results found.      Scheduled Meds: . sodium chloride   Intravenous Once  . acetaminophen  650 mg Oral Q6H  . anastrozole  1 mg Oral Daily  . atorvastatin  40 mg Oral Daily  . cholestyramine  4 g Oral BID  . citalopram  20 mg Oral Daily  . diltiazem  120 mg Oral Daily  . docusate sodium  100 mg Oral BID  . magnesium oxide  400 mg Oral Daily  . OLANZapine  2.5 mg Oral QHS   Continuous Infusions:    LOS: 5 days    Time spent: over 30min    Domenic Polite, MD [facility]  10/02/2018, 2:42 PM

## 2018-10-02 NOTE — Progress Notes (Signed)
Physical Therapy Treatment Patient Details Name: Summer Williams MRN: [id number] DOB: July 27, 1945 Today's Date: 10/02/2018    History of Present Illness Summer Williams is a 73 y.o. female with history of COPD, hyperlipidemia, breast cancer, lung cancer, Schizoaffective disorder, thrombocytopenia had a fall at home after she tripped.  She hit her head but did not lose consciousness. Found to have right prox humerus fx --non-operative. Right distal humerus fx -ORIF 4/30.Right wrist fx--non-operative management. Bilateral subdural hematoma with right periorbital hematoma     PT Comments    Pt making slow progress with functional mobility. She remains limited secondary to weakness and fatigue (quickly with minimal activity). Pt on 3L of O2 throughout with SPO2 maintaining >90%; however, HR again elevating quickly from low 110's at rest to as high as 139bpm with activity. Spoke with family via telephone at end of session to provide education re: assistance needed for mobility and ADLs, guarding, pacing activity, once home. Family very receptive and agreeable. Pt would continue to benefit from skilled physical therapy services at this time while admitted and after d/c to address the below listed limitations in order to improve overall safety and independence with functional mobility.   Follow Up Recommendations  Home health PT;Supervision/Assistance - 24 hour     Equipment Recommendations  3in1 (PT);Other (comment)(family may purchase a quad cane, if not will need one)    Recommendations for Other Services       Precautions / Restrictions Precautions Precautions: Fall Restrictions Weight Bearing Restrictions: Yes RUE Weight Bearing: Non weight bearing    Mobility  Bed Mobility               General bed mobility comments: pt OOB in recliner chair upon arrival  Transfers Overall transfer level: Needs assistance Equipment used: Quad cane Transfers: Sit to/from Stand Sit to  Stand: Min guard         General transfer comment: cueing for safe hand placement and technique, min guard with increased time needed  Ambulation/Gait Ambulation/Gait assistance: Min guard Gait Distance (Feet): 20 Feet Assistive device: Quad cane Gait Pattern/deviations: Step-to pattern;Step-through pattern;Decreased step length - right;Decreased step length - left;Decreased stride length;Shuffle;Trunk flexed Gait velocity: decreased   General Gait Details: pt continues to demonstrate a very slow, cautious and guarded gait with shuffling steps. Pt with improved stability with use of cane this session   Stairs             Wheelchair Mobility    Modified Rankin (Stroke Patients Only)       Balance Overall balance assessment: Needs assistance Sitting-balance support: No upper extremity supported;Feet supported Sitting balance-Leahy Scale: Fair     Standing balance support: No upper extremity supported;Single extremity supported Standing balance-Leahy Scale: Poor                              Cognition Arousal/Alertness: Awake/alert Behavior During Therapy: WFL for tasks assessed/performed Overall Cognitive Status: Within Functional Limits for tasks assessed                                        Exercises      General Comments        Pertinent Vitals/Pain Pain Assessment: No/denies pain    Home Living  Prior Function            PT Goals (current goals can now be found in the care plan section) Acute Rehab PT Goals PT Goal Formulation: With patient Time For Goal Achievement: 10/15/18 Potential to Achieve Goals: Good Progress towards PT goals: Progressing toward goals    Frequency    Min 3X/week      PT Plan Current plan remains appropriate    Co-evaluation              AM-PAC PT "6 Clicks" Mobility   Outcome Measure  Help needed turning from your back to your side while in  a flat bed without using bedrails?: A Little Help needed moving from lying on your back to sitting on the side of a flat bed without using bedrails?: A Little Help needed moving to and from a bed to a chair (including a wheelchair)?: A Little Help needed standing up from a chair using your arms (e.g., wheelchair or bedside chair)?: A Little Help needed to walk in hospital room?: A Little Help needed climbing 3-5 steps with a railing? : A Lot 6 Click Score: 17    End of Session Equipment Utilized During Treatment: Gait belt Activity Tolerance: Patient limited by fatigue Patient left: in chair;with call bell/phone within reach;with chair alarm set Nurse Communication: Mobility status PT Visit Diagnosis: Other abnormalities of gait and mobility (R26.89);Muscle weakness (generalized) (M62.81)     Time: 4469-5072 PT Time Calculation (min) (ACUTE ONLY): 43 min  Charges:  $Gait Training: 23-37 mins $Therapeutic Activity: 8-22 mins                     Sherie Don, [place], DPT  Acute Rehabilitation Services Pager [phone] Office [place] 10/02/2018, 3:10 PM

## 2018-10-02 NOTE — Plan of Care (Signed)

## 2018-10-02 NOTE — Progress Notes (Signed)
Occupational Therapy Treatment Patient Details Name: Summer Williams MRN: [id number] DOB: 19-Jan-1946 Today's Date: 10/02/2018    History of present illness Summer Williams is a 73 y.o. female with history of COPD, hyperlipidemia, breast cancer, lung cancer, Schizoaffective disorder, thrombocytopenia had a fall at home after she tripped.  She hit her head but did not lose consciousness. Found to have right prox humerus fx --non-operative. Right distal humerus fx -ORIF 4/30.Right wrist fx--non-operative management. Bilateral subdural hematoma with right periorbital hematoma    OT comments  This 73 yo female admitted with above presents to acute OT with education completed with pt and family (via face time) for RUE ROM, dressing, positioning of RUE, sling donning for RUE and correct positioning (can have off in bed or while seated as long as arm is propped).  Spoke to CM via phone for recommendations of HHOT/PT, 3n1, and hospital bed.   Follow Up Recommendations  Home health OT;Supervision/Assistance - 24 hour;Other (comment)([facility])    Equipment Recommendations  3 in 1 bedside commode;Hospital bed       Precautions / Restrictions Precautions Precautions: Fall Required Braces or Orthoses: Sling Restrictions Weight Bearing Restrictions: Yes RUE Weight Bearing: Non weight bearing Other Position/Activity Restrictions: Sling and gentle motion ok       Mobility Bed Mobility Overal bed mobility: Needs Assistance Bed Mobility: Supine to Sit     Supine to sit: HOB elevated;Mod assist        Transfers Overall transfer level: Needs assistance   Transfers: Sit to/from Stand Sit to Stand: Min guard         General transfer comment: min A to ambulate 5 feet to recliner from bed with single hand held A    Balance Overall balance assessment: Needs assistance Sitting-balance support: No upper extremity supported;Feet supported Sitting balance-Leahy Scale: Fair     Standing  balance support: Single extremity supported Standing balance-Leahy Scale: Poor                             ADL either performed or assessed with clinical judgement   ADL                                         General ADL Comments: Face timed with son and dtr in law for showing them gentle PROM for shoulder and digits, positioning in sling and propping RUE when sitting and laying down, clothes that are easiest to wear, we would provide a gait belt for them to use with her     Vision Baseline Vision/History: Wears glasses(has glasses here but right arm is bent) Wears Glasses: At all times Patient Visual Report: No change from baseline            Cognition Arousal/Alertness: Awake/alert Behavior During Therapy: WFL for tasks assessed/performed Overall Cognitive Status: Within Functional Limits for tasks assessed                                                     Pertinent Vitals/ Pain       Pain Assessment: 0-10 Pain Score: 4  Pain Location: right elbow Pain Descriptors / Indicators: Aching;Sore Pain Intervention(s): Limited activity within patient's tolerance;Monitored during  session;Repositioned         Frequency  Min 3X/week        Progress Toward Goals  OT Goals(current goals can now be found in the care plan section)  Progress towards OT goals: Progressing toward goals     Plan Equipment recommendations need to be updated       AM-PAC OT "6 Clicks" Daily Activity     Outcome Measure   Help from another person eating meals?: A Little Help from another person taking care of personal grooming?: A Lot Help from another person toileting, which includes using toliet, bedpan, or urinal?: A Lot Help from another person bathing (including washing, rinsing, drying)?: A Lot Help from another person to put on and taking off regular upper body clothing?: A Lot Help from another person to put on and taking off  regular lower body clothing?: Total 6 Click Score: 12    End of Session Equipment Utilized During Treatment: Gait belt;Oxygen(3 liters)  OT Visit Diagnosis: Unsteadiness on feet (R26.81);Other abnormalities of gait and mobility (R26.89);Muscle weakness (generalized) (M62.81);History of falling (Z91.81);Pain Pain - Right/Left: Right Pain - part of body: Arm   Activity Tolerance Patient tolerated treatment well   Patient Left in chair;with call bell/phone within reach;with chair alarm set;Other (comment)(with RUE so hand not in dependent position)   Nurse Communication (I am going to recommend a hospital bed and will let CM know)        Time: 4801-6553 OT Time Calculation (min): 50 min  Charges: OT General Charges $OT Visit: 1 Visit OT Treatments $Self Care/Home Management : 38-52 mins  [place], OTR/L Acute [employer] Pager [phone] Office [phone]     Almon Register 10/02/2018, 7:14 PM

## 2018-10-02 NOTE — TOC Initial Note (Addendum)
Transition of Care [facility]) - Initial/Assessment Note    Patient Details  Name: Summer Williams MRN: [id number] Date of Birth: 02-Jul-1945  Transition of Care [facility]) CM/SW Contact:    Claudie Leach, RN Phone Number: 10/02/2018, 2:11 PM  Clinical Narrative:        Pt from home alone.  Pt has antique bed that is difficult to get in and out of.  Pt was getting out of bed and fell resulting in arm fracture.   Pt's son lives next to her and will stay with her upon d/c.   Pt requires a DME hospital bed for the ability to raise and lower the head of the bed to aid in repositioning and medical care.  The patient needs a hospital bed that can be raised and lowered to enable medical care to be performed.       Order placed and called to [place] with [employer].  Will plan on delivery on day of discharge, possible tomorrow.    Expected Discharge Plan: [place] Barriers to Discharge: No Barriers Identified    Expected Discharge Plan and Services Expected Discharge Plan: [place]         Expected Discharge Date: 09/30/18                Prior Living Arrangements/Services     Patient language and need for interpreter reviewed:: Yes        Need for Family Participation in Patient Care: Yes (Comment) Care giver support system in place?: Yes (comment)   Criminal Activity/Legal Involvement Pertinent to Current Situation/Hospitalization: No - Comment as needed  Activities of Daily Living Home Assistive Devices/Equipment: Shower chair with back, Grab bars around toilet ADL Screening (condition at time of admission) Patient's cognitive ability adequate to safely complete daily activities?: Yes Is the patient deaf or have difficulty hearing?: Yes Does the patient have difficulty seeing, even when wearing glasses/contacts?: No Does the patient have difficulty concentrating, remembering, or making decisions?: No Patient able to express need for  assistance with ADLs?: Yes Does the patient have difficulty dressing or bathing?: Yes Independently performs ADLs?: Yes (appropriate for developmental age) Does the patient have difficulty walking or climbing stairs?: Yes Weakness of Legs: Right Weakness of Arms/Hands: Right    Alcohol / Substance Use: Not Applicable Psych Involvement: No (comment)  Admission diagnosis:  Platelets decreased ([place]) [D69.6] Subdural hematoma ([place]) [S06.5X9A] COPD exacerbation ([place]) [J44.1] Closed displaced fracture of greater tuberosity of right humerus, initial encounter [S42.251A] Closed nondisplaced fracture of styloid process of right ulna, initial encounter [S52.614A] Iron deficiency anemia, unspecified iron deficiency anemia type [D50.9] Other closed intra-articular fracture of distal end of right radius, initial encounter [S52.571A] Patient Active Problem List   Diagnosis Date Noted  . Subdural hematoma ([place]) 09/27/2018  . Closed displaced fracture of greater tuberosity of right humerus   . Closed nondisplaced fracture of styloid process of right ulna   . 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 Chronic respiratory failure with hypoxia ([place]) 05/05/2017  . Bone metastasis ([place]) 03/20/2016  . DNR (do not resuscitate) 03/10/2016  . Pain from bone metastases ([place]) 03/10/2016  . 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 ([place]) 11/21/2014  . Nonspecific abnormal finding in stool contents 11/08/2014  . Diverticulosis of colon without hemorrhage 11/08/2014  . Internal hemorrhoids 11/08/2014  . Abnormal CT scan, chest 11/06/2014  . Encounter for screening colonoscopy 08/22/2014  . Colon cancer screening 08/22/2014  .  Diarrhea 03/01/2014  . Cancer of lower-inner quadrant of left female breast ([place]) 01/09/2014  . Dyspnea 08/30/2012  . Elevated liver enzymes 05/03/2011  . Hyperlipidemia 04/11/2011  . Schizoaffective disorder ([place]) 04/11/2011  . COPD exacerbation ([place]) 04/11/2011  .  Vitamin D deficiency 04/11/2011   PCP:  Elby Showers, MD Pharmacy:   [id number] - [place], [place] [address] [place] [address] Phone: [phone] Fax: [phone]

## 2018-10-02 NOTE — Progress Notes (Signed)
0230: Pt has not urinated this shift, no fluids running. Bladder scan only showed 189 mL. Paged on-call. Received order to start fluids. Will continue to monitor.   0500: Pt still has not urinated. Bladder scan shows 297. Pt able to urinate 175 mL in bedpan. Will continue to monitor.

## 2018-10-03 LAB — CBC
HCT: 29.1 % — ABNORMAL LOW (ref 36.0–46.0)
Hemoglobin: 9.3 g/dL — ABNORMAL LOW (ref 12.0–15.0)
MCH: 28.5 pg (ref 26.0–34.0)
MCHC: 32 g/dL (ref 30.0–36.0)
MCV: 89.3 fL (ref 80.0–100.0)
Platelets: 60 10*3/uL — ABNORMAL LOW (ref 150–400)
RBC: 3.26 MIL/uL — ABNORMAL LOW (ref 3.87–5.11)
RDW: 14.6 % (ref 11.5–15.5)
WBC: 13.8 10*3/uL — ABNORMAL HIGH (ref 4.0–10.5)
nRBC: 0 % (ref 0.0–0.2)

## 2018-10-03 LAB — BASIC METABOLIC PANEL
Anion gap: 13 (ref 5–15)
BUN: 9 mg/dL (ref 8–23)
CO2: 30 mmol/L (ref 22–32)
Calcium: 8.9 mg/dL (ref 8.9–10.3)
Chloride: 96 mmol/L — ABNORMAL LOW (ref 98–111)
Creatinine, Ser: 0.44 mg/dL (ref 0.44–1.00)
GFR calc Af Amer: >60 mL/min (ref >60)
GFR calc non Af Amer: >60 mL/min (ref >60)
Glucose, Bld: 119 mg/dL — ABNORMAL HIGH (ref 70–99)
Potassium: 3.1 mmol/L — ABNORMAL LOW (ref 3.5–5.1)
Sodium: 139 mmol/L (ref 135–145)

## 2018-10-03 MED ORDER — OLANZAPINE 2.5 MG PO TABS: 2.5000 mg | ORAL_TABLET | Freq: Every day | ORAL | 0 refills | Status: AC

## 2018-10-03 MED ORDER — DILTIAZEM HCL ER COATED BEADS 120 MG PO CP24: 120.0000 mg | ORAL_CAPSULE | Freq: Every day | ORAL | 0 refills | Status: AC

## 2018-10-03 MED ORDER — SENNA 8.6 MG PO TABS: 1.0000 | ORAL_TABLET | Freq: Every day | ORAL | 0 refills | Status: AC | PRN

## 2018-10-03 MED ORDER — POTASSIUM CHLORIDE CRYS ER 20 MEQ PO TBCR
40.0000 meq | EXTENDED_RELEASE_TABLET | Freq: Once | ORAL | Status: AC
  Administered 2018-10-03: 40 meq via ORAL
  Filled 2018-10-03: qty 2

## 2018-10-03 NOTE — Progress Notes (Signed)
Occupational Therapy Treatment Patient Details Name: Summer Williams MRN: [id number] DOB: 1946/04/17 Today's Date: 10/03/2018    History of present illness Summer Williams is a 73 y.o. female with history of COPD, hyperlipidemia, breast cancer, lung cancer, Schizoaffective disorder, thrombocytopenia had a fall at home after she tripped.  She hit her head but did not lose consciousness. Found to have right prox humerus fx --non-operative. Right distal humerus fx -ORIF 4/30.Right wrist fx--non-operative management. Bilateral subdural hematoma with right periorbital hematoma    OT comments  Pt. Agreeable to participation in skilled OT.  Able to complete bed mobility mod A for supporting RUE during transfer.  Min a for stand pivot to recliner.  Reviewed and return demo of RUE ROM and positioning.   Follow Up Recommendations  Home health OT;Supervision/Assistance - 24 hour;Other (comment) [place] aide    Equipment Recommendations  3 in 1 bedside commode;Hospital bed    Recommendations for Other Services      Precautions / Restrictions Precautions Precautions: Fall Required Braces or Orthoses: Sling Restrictions RUE Weight Bearing: Non weight bearing Other Position/Activity Restrictions: Sling and gentle motion ok       Mobility Bed Mobility Overal bed mobility: Needs Assistance Bed Mobility: Supine to Sit     Supine to sit: HOB elevated;Mod assist     General bed mobility comments: pt. able to pull towards eob using LUE on bed rail, no physical assistance to guide b les oob. mod a for supporting RUE during scooting.  exited on L side of bed  Transfers     Transfers: Sit to/from [facility] Sit to Stand: Min guard Stand pivot transfers: Min assist            Balance                                           ADL either performed or assessed with clinical judgement   ADL   Eating/Feeding: Set up;Sitting Eating/Feeding Details (indicate  cue type and reason): unable to hold full coffee cup, states she is "too weak".  poured 1/2 of coffee into styrofoam cup and pt. felt more comfortable holding and using this cup to drink her coffee                     Toilet Transfer: Minimal assistance;Stand-pivot Toilet Transfer Details (indicate cue type and reason): bed>recliner, cues for hand placement on arm rest to aide in full pivot before sitting down.          Functional mobility during ADLs: Minimal assistance       Vision       Perception     Praxis      Cognition Arousal/Alertness: Awake/alert Behavior During Therapy: WFL for tasks assessed/performed Overall Cognitive Status: Within Functional Limits for tasks assessed                                          Exercises Other Exercises Other Exercises: reviewed rom for RUE, pt. able to return demo of digit flex/ext. verbalized understanding of need for pillow positioning with sling RUE   Shoulder Instructions       General Comments  pt. Reports her son and dtr. In law are moving in to help take care of  her. Reports concerns as she feels so weak and is worried it will be too much for them.  Provided reassurance and support for pt.     Pertinent Vitals/ Pain       Pain Assessment: Faces Faces Pain Scale: Hurts a little bit Pain Location: R UE Pain Descriptors / Indicators: Aching;Sore Pain Intervention(s): Limited activity within patient's tolerance;Monitored during session;Repositioned  Home Living                                          Prior Functioning/Environment              Frequency  Min 3X/week        Progress Toward Goals  OT Goals(current goals can now be found in the care plan section)  Progress towards OT goals: Progressing toward goals     Plan Discharge plan remains appropriate    Co-evaluation                 AM-PAC OT "6 Clicks" Daily Activity     Outcome Measure    Help from another person eating meals?: A Little Help from another person taking care of personal grooming?: A Lot Help from another person toileting, which includes using toliet, bedpan, or urinal?: A Lot Help from another person bathing (including washing, rinsing, drying)?: A Lot Help from another person to put on and taking off regular upper body clothing?: A Lot Help from another person to put on and taking off regular lower body clothing?: Total 6 Click Score: 12    End of Session Equipment Utilized During Treatment: Gait belt;Oxygen;Other (comment)(3 liters, pt. reports uses o2 at home and same amount)  OT Visit Diagnosis: Unsteadiness on feet (R26.81);Other abnormalities of gait and mobility (R26.89);Muscle weakness (generalized) (M62.81);History of falling (Z91.81);Pain Pain - Right/Left: Right Pain - part of body: Arm   Activity Tolerance Patient tolerated treatment well   Patient Left in chair;with call bell/phone within reach;with chair alarm set   Nurse Communication          Time: [phone] OT Time Calculation (min): 24 min  Charges: OT General Charges $OT Visit: 1 Visit OT Treatments $Self Care/Home Management : 23-37 mins     Janice Coffin, COTA/L 10/03/2018, 9:37 AM

## 2018-10-03 NOTE — TOC Transition Note (Signed)
Transition of Care [facility]) - CM/SW Discharge Note   Patient Details  Name: Summer Williams MRN: [id number] Date of Birth: 1946-02-19  Transition of Care [facility]) CM/SW Contact:  Claudie Leach, RN Phone Number: 10/03/2018, 12:02 PM   Clinical Narrative:    Pt to d/c home with son today.  Hospital bed ordered yesterday with goal of delivery today.  Confirmed with Brad from [place].   Reviewed Medicare [place] list over the phone with patient.  Pt has no preference and agrees to [employer].  Referral called to Sharmon Revere.    Final next level of care: [place] Barriers to Discharge: No Barriers Identified   Patient Goals and CMS Choice Patient states their goals for this hospitalization and ongoing recovery are:: to go home CMS Medicare.gov Compare Post Acute Care list provided to:: Patient Choice offered to / list presented to : Patient   Discharge Plan and Services                DME Arranged: Hospital bed DME Agency: [facility] Date DME Agency Contacted: 10/02/18 Time DME Agency Contacted: [phone] Representative spoke with at DME Agency: [place]: PT, OT [facility] Agency: [place] Date [place]: 10/03/18 Time [place]: 2122 Representative spoke with at [place]: Sharmon Revere, RN

## 2018-10-03 NOTE — Discharge Summary (Signed)
Physician Discharge Summary  Summer Williams VVO:[id number] DOB: [birth year] DOA: 09/26/2018  PCP: Elby Showers, MD  Admit date: 09/26/2018 Discharge date: 10/03/2018  Time spent: 35 minutes  Recommendations for Outpatient Follow-up:  PCP Dr. Renold Genta in 1 week Orthopedics Dr. Edmonia Lynch in 7 to 10 days Oncology Dr. Sonny Dandy in 2-3 weeks Home health PT and OT  Discharge Diagnoses:  Principal Problem:  Subdural hematoma ([place]) Right proximal humerus fracture Right distal humerus fracture Right distal radius fracture Severe COPD/chronic respiratory failure on 3 L home O2 Anemia and thrombocytopenia Schizoaffective disorder ([place]) 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 ([place]) History of breast cancer DNR (do not resuscitate)   Discharge Condition: Stable  Diet recommendation: Low-sodium, heart healthy  Filed Weights   09/26/18 2012  Weight: 76.2 kg    History of present illness:  Summer Skeeter Gregoriois a 73/F with chronic respiratory failure, COPD on 3 L home oxygen, chronic thrombocytopenia, history of breast cancer, and lung cancer admitted following a fall at home after she tripped. She hit her head but did not lose consciousness.  In the ER patient, imaging noted bilateral subdural hematoma with mild shift. Also showed right periorbital hematoma. There was mild laceration on the right pupil area for which patient declined sutures.labs also showed anemia with severe thrombocytopenia.  -X-rays revealed right distal humerus and distal radius and ulnar styloid fracture. On-call neurosurgeon Dr. Venetia Constable was consulted   Hospital Course:   1.  Bilateral subdural hematoma with right periorbital hematoma- -following a mechanical fall  -neurosurgery consulted- s/p 1 unit platelets given thrombocytopenia.  -Recommended nonoperative management -Stable from this standpoint  2. Right upper extremity fractures  Comminuted Fracture Involving Greater Tuberosity of  Humerous with suspected transverse fracture lucency at the R humeral neck  Distal Humerus Fracture  distal radius and ulnar styloid fracture  -Orthopedics Dr. Edmonia Lynch was consulted -Right proximal humerus fracture -> nonoperative management with nonweightbearing and sling recommended  -Right distal humerus and distal radius fractures ->Underwent ORIF 4/30, nonweightbearing, sling and compressive dressings recommended  -Follow-up with orthopedics in 1 to 2 weeks, recommended to leave the dressing intact until follow-up  3.   COPD/chronic respiratory failure -Stable from the standpoint,  -She is afebrile and nontoxic, continue incentive spirometer, flutter valve encouraged -Also recommended nebulizers as needed unfortunately patient refuses this as she says nothing helps her COPD  4.  Anemia and Severe chronic thrombocytopenia -long Standing, ongoing for a number of years  transfused 1 unit of PRBC 4/29  -also transfused 2 more units of PRBC in OR, and given 1 unit of platelets -Acute worsening in the setting of surgery, postop blood loss, hemodilution -Etiology of chronic anemia and thrombocytopenia unknown, liver US not suggestive of cirrhosis, prior history of alcoholism quit 38 years ago, Hep B and C negative -Discussed with oncologist Dr. Sonny Dandy he may consider bone marrow aspiration biopsy down the road  5.  History of breast cancer -On anastrozole  6.  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 Lindi Adie  7.Schizoaffective disorder  -on Zyprexa and citalopram.  8.  Hypertension with mild sinus tachycardia -she was started on diltiazem at low dose for this  Code Status: DNR  Discharge Exam: Vitals:   10/03/18 0744 10/03/18 1307  BP: 110/76 (!) 127/58  Pulse: (!) 118 (!) 106  Resp: 20   Temp: 98.1 F (36.7 C) 99.5  F (37.5 C)   SpO2: 98% 100%    General: AAOx3 Cardiovascular: S1S2/RRR Respiratory: Poor air movement bilaterally  Discharge Instructions   Discharge Instructions    Diet - low sodium heart healthy   Complete by:  As directed    Increase activity slowly   Complete by:  As directed      Allergies as of 10/03/2018   No Known Allergies     Medication List    TAKE these medications   albuterol (2.5 MG/3ML) 0.083% nebulizer solution Commonly known as:  PROVENTIL Take 3 mLs (2.5 mg total) by nebulization every 6 (six) hours as needed for wheezing or shortness of breath.   anastrozole 1 MG tablet Commonly known as:  ARIMIDEX TAKE 1 TABLET BY MOUTH EVERY DAY   arformoterol 15 MCG/2ML Nebu Commonly known as:  BROVANA 1 neb every 8 hours if needed   aspirin 81 MG tablet Take 1 tablet (81 mg total) by mouth daily.   atorvastatin 40 MG tablet Commonly known as:  LIPITOR TAKE 1 TABLET BY MOUTH EVERY DAY   cholestyramine 4 g packet Commonly known as:  QUESTRAN TAKE 1 PACKET (4 G TOTAL) BY MOUTH 2 (TWO) TIMES DAILY.   citalopram 20 MG tablet Commonly known as:  CELEXA Take 20 mg by mouth daily.   diltiazem 120 MG 24 hr capsule Commonly known as:  CARDIZEM CD Take 1 capsule (120 mg total) by mouth daily. Start taking on:  Oct 04, 2018   Fish Oil 1200 MG Caps Take 1 capsule by mouth daily.   loratadine 10 MG tablet Commonly known as:  CLARITIN Take 10 mg by mouth daily.   magnesium oxide 400 MG tablet Commonly known as:  MAG-OX Take 400 mg by mouth daily.   MULTIVITAMIN GUMMIES ADULT PO Take 2 tablets by mouth daily.   OLANZapine 2.5 MG tablet Commonly known as:  ZYPREXA Take 1 tablet (2.5 mg total) by mouth daily. What changed:    medication strength  how much to take   oxyCODONE-acetaminophen 5-325 MG tablet Commonly known as:  PERCOCET/ROXICET Take 1 tablet by mouth every 6 (six) hours as needed for severe pain. What changed:  additional instructions    PROBIOTIC DAILY PO Take 2 tablets by mouth daily. Probiotic gummy   senna 8.6 MG Tabs tablet Commonly known as:  SENOKOT Take 1 tablet (8.6 mg total) by mouth daily as needed for mild constipation.   umeclidinium-vilanterol 62.5-25 MCG/INH Aepb Commonly known as:  Anoro Ellipta Inhale 1 puff into the lungs daily.   Vitamin D 50 MCG (2000 UT) Caps Take 1 capsule by mouth daily.            Durable Medical Equipment  (From admission, onward)         Start     Ordered   10/02/18 1408  For home use only DME Hospital bed  Once    Question Answer Comment  The above medical condition requires: Patient requires the ability to reposition frequently   Head must be elevated greater than: 30 degrees   Bed type Semi-electric   Support Surface: Gel Overlay      10/02/18 1408         No Known Allergies Follow-up Information    Renette Butters, MD In 10 days.   Specialty:  Orthopedic Surgery Contact information: [address] [place] [address] [place] [address] [phone]        Elby Showers, MD. Schedule an appointment as soon  as possible for a visit in 1 week(s).   Specialty:  Internal Medicine Contact information: [address] [address] [phone]            The results of significant diagnostics from this hospitalization (including imaging, microbiology, ancillary and laboratory) are listed below for reference.    Significant Diagnostic Studies: Dg Chest 2 View  Result Date: 09/26/2018 CLINICAL DATA:  Shortness of breath EXAM: CHEST - 2 VIEW COMPARISON:  09/03/2017, PET CT 11/24/2016 FINDINGS: Masslike opacity and architectural distortion in the right hilar region, slightly more opaque. Volume loss in the right hemithorax consistent with post treatment change. No pleural effusion. Stable cardiomediastinal silhouette with aortic atherosclerosis. No pneumothorax. Subtle fracture proximal right humerus. Age indeterminate right  seventh rib fracture. IMPRESSION: 1. Negative for pneumothorax. 2. Masslike opacity with architectural distortion in the right hilar region, possibly due to post treatment change however this region appears slightly more opaque compared to prior and recurrent mass is possible. 3. Subtle fracture involving the proximal right humerus. 4. Age indeterminate right seventh rib fracture Electronically Signed   By: Donavan Foil M.D.   On: 09/26/2018 21:54   Dg Pelvis 1-2 Views  Result Date: 09/26/2018 CLINICAL DATA:  Fall EXAM: PELVIS - 1-2 VIEW COMPARISON:  None. FINDINGS: SI joints are intact. Pubic symphysis and rami show no fracture. No fracture or dislocation is evident. IMPRESSION: No acute osseous abnormality Electronically Signed   By: Donavan Foil M.D.   On: 09/26/2018 21:44   Dg Shoulder Right  Result Date: 09/30/2018 CLINICAL DATA:  Status post right proximal humeral fracture EXAM: RIGHT SHOULDER - 2+ VIEW COMPARISON:  09/26/2018 FINDINGS: Redemonstrated is a minimally displaced fracture of the right greater tuberosity. Generalized osteopenia. No other fracture or dislocation. Mild arthropathy of the acromioclavicular joint. IMPRESSION: Redemonstrated is a minimally displaced fracture of the right greater tuberosity. Electronically Signed   By: Kathreen Devoid   On: 09/30/2018 12:57   Dg Shoulder Right  Result Date: 09/26/2018 CLINICAL DATA:  Fall EXAM: RIGHT SHOULDER - 2+ VIEW COMPARISON:  None. FINDINGS: Acute mildly comminuted fracture at the greater tuberosity of the humerus. Suspected faint transverse lucency through the neck of the humerus. No dislocation. Mild AC joint degenerative change. Architectural distortion at the right hilus IMPRESSION: 1. Acute comminuted fracture involving the greater tuberosity of the humerus with suspected transverse fracture lucency at the right humeral neck. Electronically Signed   By: Donavan Foil M.D.   On: 09/26/2018 21:43   Dg Elbow Complete Right  Result  Date: 09/26/2018 CLINICAL DATA:  Fall EXAM: RIGHT ELBOW - COMPLETE 3+ VIEW COMPARISON:  None. FINDINGS: Acute, mildly comminuted fracture involving the distal humerus above the level of the condyles. About 1/3 bone with ulnar displacement of distal fracture fragment. Associated large elbow effusion. Radial head alignment is within normal limits. IMPRESSION: Acute, mildly comminuted and displaced distal humerus fracture with associated elbow effusion Electronically Signed   By: Donavan Foil M.D.   On: 09/26/2018 21:48   Dg Forearm Right  Result Date: 09/26/2018 CLINICAL DATA:  Fall EXAM: RIGHT FOREARM - 2 VIEW COMPARISON:  None. FINDINGS: Acute minimally displaced fracture at the ulnar styloid process. Acute impacted intra-articular distal radius fracture with mild dorsal displacement. Incompletely visualized distal humerus fracture. IMPRESSION: 1. Acute impacted intra-articular distal radius fracture. Acute ulnar styloid process fracture 2. Incompletely visualized displaced distal humerus fracture Electronically Signed   By: Donavan Foil M.D.   On: 09/26/2018 21:47   Ct Head  Wo Contrast  Result Date: 09/26/2018 CLINICAL DATA:  Initial evaluation for acute trauma, fall. EXAM: CT HEAD WITHOUT CONTRAST CT MAXILLOFACIAL WITHOUT CONTRAST CT CERVICAL SPINE WITHOUT CONTRAST TECHNIQUE: Multidetector CT imaging of the head, cervical spine, and maxillofacial structures were performed using the standard protocol without intravenous contrast. Multiplanar CT image reconstructions of the cervical spine and maxillofacial structures were also generated. COMPARISON:  None. FINDINGS: CT HEAD FINDINGS Brain: Generalized age-related cerebral atrophy with mild chronic small vessel ischemic disease. Acute subdural hematoma overlies the right cerebral convexity, measuring up to 6 mm in maximal thickness. Additional smaller subdural hemorrhage seen overlying the left parietal convexity, measuring up to 5 mm in maximal diameter.  Associated trace 1-2 mm right-to-left shift. No hydrocephalus or ventricular trapping. No other acute intracranial hemorrhage. No acute large vessel territory infarct. No mass lesion. Vascular: No hyperdense vessel. Scattered vascular calcifications noted within the carotid siphons. Skull: Soft tissue contusion at the right periorbital region. Calvarium intact. Other: Trace left mastoid effusion noted, of doubtful significance. CT MAXILLOFACIAL FINDINGS Osseous: A zygomatic arches are intact. No acute maxillary fracture. Pterygoid plates intact. No acute nasal bone fracture. Mild left-to-right nasal septal deviation without associated fracture. Mandible intact. Mandibular condyles normally situated. No acute abnormality about the dentition. Orbits: Globes intact. Normal retro-orbital hematoma or other pathology. Bony orbits intact without fracture. Sinuses: Paranasal sinuses are clear.  No hemosinus. Soft tissues: Large right periorbital/facial contusion. CT CERVICAL SPINE FINDINGS Alignment: Straightening of the normal cervical lordosis. No listhesis. Skull base and vertebrae: Skull base intact. Normal C1-2 articulations are preserved in the dens is intact. Vertebral body heights maintained. No acute fracture. Soft tissues and spinal canal: Soft tissues of the neck demonstrate no acute finding. No abnormal prevertebral edema. Spinal canal within normal limits. Disc levels: Mild right-sided facet arthrosis at C2-3 and C3-4. No significant spinal stenosis within the cervical spine. Upper chest: Visualized upper chest demonstrates no acute finding. Upper lobe predominant centrilobular emphysema noted. Other: None. IMPRESSION: CT HEAD: 1. Acute bilateral subdural hematomas, right larger than left as above. Associated trace 1-2 mm right-to-left midline shift. 2. No other acute intracranial abnormality. 3. Mild age-related cerebral atrophy with chronic small vessel ischemic disease. CT MAXILLOFACIAL: 1. Large right  periorbital/facial soft tissue contusion. 2. No other acute maxillofacial injury. No fracture. Intact globes with no retro-orbital pathology. CT CERVICAL SPINE: No acute traumatic injury within the cervical spine. Critical Value/emergent results were called by telephone at the time of interpretation on 09/26/2018 at 10:50 pm to Dr. Shelby Dubin , who verbally acknowledged these results. Electronically Signed   By: Jeannine Boga M.D.   On: 09/26/2018 22:52   Ct Cervical Spine Wo Contrast  Result Date: 09/26/2018 CLINICAL DATA:  Initial evaluation for acute trauma, fall. EXAM: CT HEAD WITHOUT CONTRAST CT MAXILLOFACIAL WITHOUT CONTRAST CT CERVICAL SPINE WITHOUT CONTRAST TECHNIQUE: Multidetector CT imaging of the head, cervical spine, and maxillofacial structures were performed using the standard protocol without intravenous contrast. Multiplanar CT image reconstructions of the cervical spine and maxillofacial structures were also generated. COMPARISON:  None. FINDINGS: CT HEAD FINDINGS Brain: Generalized age-related cerebral atrophy with mild chronic small vessel ischemic disease. Acute subdural hematoma overlies the right cerebral convexity, measuring up to 6 mm in maximal thickness. Additional smaller subdural hemorrhage seen overlying the left parietal convexity, measuring up to 5 mm in maximal diameter. Associated trace 1-2 mm right-to-left shift. No hydrocephalus or ventricular trapping. No other acute intracranial hemorrhage. No acute large vessel territory infarct. No mass lesion.  Vascular: No hyperdense vessel. Scattered vascular calcifications noted within the carotid siphons. Skull: Soft tissue contusion at the right periorbital region. Calvarium intact. Other: Trace left mastoid effusion noted, of doubtful significance. CT MAXILLOFACIAL FINDINGS Osseous: A zygomatic arches are intact. No acute maxillary fracture. Pterygoid plates intact. No acute nasal bone fracture. Mild left-to-right nasal  septal deviation without associated fracture. Mandible intact. Mandibular condyles normally situated. No acute abnormality about the dentition. Orbits: Globes intact. Normal retro-orbital hematoma or other pathology. Bony orbits intact without fracture. Sinuses: Paranasal sinuses are clear.  No hemosinus. Soft tissues: Large right periorbital/facial contusion. CT CERVICAL SPINE FINDINGS Alignment: Straightening of the normal cervical lordosis. No listhesis. Skull base and vertebrae: Skull base intact. Normal C1-2 articulations are preserved in the dens is intact. Vertebral body heights maintained. No acute fracture. Soft tissues and spinal canal: Soft tissues of the neck demonstrate no acute finding. No abnormal prevertebral edema. Spinal canal within normal limits. Disc levels: Mild right-sided facet arthrosis at C2-3 and C3-4. No significant spinal stenosis within the cervical spine. Upper chest: Visualized upper chest demonstrates no acute finding. Upper lobe predominant centrilobular emphysema noted. Other: None. IMPRESSION: CT HEAD: 1. Acute bilateral subdural hematomas, right larger than left as above. Associated trace 1-2 mm right-to-left midline shift. 2. No other acute intracranial abnormality. 3. Mild age-related cerebral atrophy with chronic small vessel ischemic disease. CT MAXILLOFACIAL: 1. Large right periorbital/facial soft tissue contusion. 2. No other acute maxillofacial injury. No fracture. Intact globes with no retro-orbital pathology. CT CERVICAL SPINE: No acute traumatic injury within the cervical spine. Critical Value/emergent results were called by telephone at the time of interpretation on 09/26/2018 at 10:50 pm to Dr. Shelby Dubin , who verbally acknowledged these results. Electronically Signed   By: Jeannine Boga M.D.   On: 09/26/2018 22:52   Dg Humerus Right  Result Date: 09/30/2018 CLINICAL DATA:  Elbow fracture. EXAM: DG C-ARM 61-120 MIN; RIGHT HUMERUS - 2+ VIEW FLUOROSCOPY  TIME:  20 seconds. COMPARISON:  Radiographs of September 26, 2018. FINDINGS: Two intraoperative fluoroscopic images were obtained of the right elbow. These images demonstrate surgical internal fixation of the distal right humeral fracture with overlying expected postoperative changes in the soft tissues. Improved alignment of fracture components is noted. IMPRESSION: Status post open reduction and internal fixation of distal right humeral fracture. Good alignment of fracture components is noted. Electronically Signed   By: Marijo Conception M.D.   On: 09/30/2018 09:55   Dg Hand Complete Right  Result Date: 09/26/2018 CLINICAL DATA:  Fall EXAM: RIGHT HAND - COMPLETE 3+ VIEW COMPARISON:  None. FINDINGS: Bones appear osteopenic. Mild arthritis at the first [facility] joint. Degenerative changes at the DIP and PIP joints. Acute nondisplaced fracture ulnar styloid process. Acute, mildly comminuted and impacted intra-articular fracture at the distal radius. IMPRESSION: 1. Acute comminuted impacted intra-articular fracture of the distal radius 2. Acute nondisplaced ulnar styloid process fracture Electronically Signed   By: Donavan Foil M.D.   On: 09/26/2018 21:46   Dg C-arm 1-60 Min  Result Date: 09/30/2018 CLINICAL DATA:  Elbow fracture. EXAM: DG C-ARM 61-120 MIN; RIGHT HUMERUS - 2+ VIEW FLUOROSCOPY TIME:  20 seconds. COMPARISON:  Radiographs of September 26, 2018. FINDINGS: Two intraoperative fluoroscopic images were obtained of the right elbow. These images demonstrate surgical internal fixation of the distal right humeral fracture with overlying expected postoperative changes in the soft tissues. Improved alignment of fracture components is noted. IMPRESSION: Status post open reduction and internal fixation of distal right  humeral fracture. Good alignment of fracture components is noted. Electronically Signed   By: Marijo Conception M.D.   On: 09/30/2018 09:55   Ct Maxillofacial Wo Contrast  Result Date: 09/26/2018 CLINICAL  DATA:  Initial evaluation for acute trauma, fall. EXAM: CT HEAD WITHOUT CONTRAST CT MAXILLOFACIAL WITHOUT CONTRAST CT CERVICAL SPINE WITHOUT CONTRAST TECHNIQUE: Multidetector CT imaging of the head, cervical spine, and maxillofacial structures were performed using the standard protocol without intravenous contrast. Multiplanar CT image reconstructions of the cervical spine and maxillofacial structures were also generated. COMPARISON:  None. FINDINGS: CT HEAD FINDINGS Brain: Generalized age-related cerebral atrophy with mild chronic small vessel ischemic disease. Acute subdural hematoma overlies the right cerebral convexity, measuring up to 6 mm in maximal thickness. Additional smaller subdural hemorrhage seen overlying the left parietal convexity, measuring up to 5 mm in maximal diameter. Associated trace 1-2 mm right-to-left shift. No hydrocephalus or ventricular trapping. No other acute intracranial hemorrhage. No acute large vessel territory infarct. No mass lesion. Vascular: No hyperdense vessel. Scattered vascular calcifications noted within the carotid siphons. Skull: Soft tissue contusion at the right periorbital region. Calvarium intact. Other: Trace left mastoid effusion noted, of doubtful significance. CT MAXILLOFACIAL FINDINGS Osseous: A zygomatic arches are intact. No acute maxillary fracture. Pterygoid plates intact. No acute nasal bone fracture. Mild left-to-right nasal septal deviation without associated fracture. Mandible intact. Mandibular condyles normally situated. No acute abnormality about the dentition. Orbits: Globes intact. Normal retro-orbital hematoma or other pathology. Bony orbits intact without fracture. Sinuses: Paranasal sinuses are clear.  No hemosinus. Soft tissues: Large right periorbital/facial contusion. CT CERVICAL SPINE FINDINGS Alignment: Straightening of the normal cervical lordosis. No listhesis. Skull base and vertebrae: Skull base intact. Normal C1-2 articulations are  preserved in the dens is intact. Vertebral body heights maintained. No acute fracture. Soft tissues and spinal canal: Soft tissues of the neck demonstrate no acute finding. No abnormal prevertebral edema. Spinal canal within normal limits. Disc levels: Mild right-sided facet arthrosis at C2-3 and C3-4. No significant spinal stenosis within the cervical spine. Upper chest: Visualized upper chest demonstrates no acute finding. Upper lobe predominant centrilobular emphysema noted. Other: None. IMPRESSION: CT HEAD: 1. Acute bilateral subdural hematomas, right larger than left as above. Associated trace 1-2 mm right-to-left midline shift. 2. No other acute intracranial abnormality. 3. Mild age-related cerebral atrophy with chronic small vessel ischemic disease. CT MAXILLOFACIAL: 1. Large right periorbital/facial soft tissue contusion. 2. No other acute maxillofacial injury. No fracture. Intact globes with no retro-orbital pathology. CT CERVICAL SPINE: No acute traumatic injury within the cervical spine. Critical Value/emergent results were called by telephone at the time of interpretation on 09/26/2018 at 10:50 pm to Dr. Shelby Dubin , who verbally acknowledged these results. Electronically Signed   By: Jeannine Boga M.D.   On: 09/26/2018 22:52   US Abdomen Limited Ruq  Result Date: 09/29/2018 CLINICAL DATA:  Abnormal liver function tests. EXAM: ULTRASOUND ABDOMEN LIMITED RIGHT UPPER QUADRANT COMPARISON:  None. FINDINGS: Gallbladder: Status post cholecystectomy. Common bile duct: Diameter: 5 mm which is within normal limits. Liver: No focal lesion identified. Within normal limits in parenchymal echogenicity. Portal vein is patent on color Doppler imaging with normal direction of blood flow towards the liver. IMPRESSION: Status post cholecystectomy. No other abnormality seen in the right upper quadrant of the abdomen. Electronically Signed   By: Marijo Conception M.D.   On: 09/29/2018 15:58     Microbiology: No results found for this or any previous visit (from the past  240 hour(s)).   Labs: Basic Metabolic Panel: Recent Labs  Lab 09/27/18 1916 09/28/18 0314 09/29/18 0349 09/30/18 0443 09/30/18 0952 10/01/18 0258 10/03/18 0411  NA 143  --  138 139 142 141 139  K 3.9  --  4.3 3.4* 3.5 4.0 3.1*  CL 106  --  101 100  --  102 96*  CO2 29  --  26 30  --  27 30  GLUCOSE 156*  --  123* 121* 116* 135* 119*  BUN 9  --  8 6*  --  6* 9  CREATININE 0.57  --  0.56 0.56  --  0.59 0.44  CALCIUM 8.9  --  8.4* 8.6*  --  8.6* 8.9  MG  --  2.1 2.0  --   --   --   --    Liver Function Tests: Recent Labs  Lab 09/27/18 0736 09/27/18 1916 09/29/18 0349 09/30/18 0443  AST 37 24 29 18   ALT 22 20 14 15   ALKPHOS 65 59 63 61  BILITOT 0.8 0.6 1.3* 1.3*  PROT 5.9* 5.7* 5.9* 6.1*  ALBUMIN 3.5 3.5 3.4* 3.4*   No results for input(s): LIPASE, AMYLASE in the last 168 hours. No results for input(s): AMMONIA in the last 168 hours. CBC: Recent Labs  Lab 09/26/18 2049 09/27/18 0736 09/28/18 0314  09/29/18 0349 09/29/18 1216 09/30/18 0443 09/30/18 0952 10/01/18 0258 10/03/18 0411  WBC 9.8 11.0* 13.6*  --  15.0*  --  16.6*  --  18.6* 13.8*  NEUTROABS 5.9 9.1*  --   --  8.0*  --   --   --   --   --   HGB 9.7* 8.0* 7.0*   < > 6.7* 9.0* 8.1* 6.8* 9.9* 9.3*  HCT 31.9* 25.5* 22.6*   < > 21.6* 27.3* 25.1* 20.0* 29.9* 29.1*  MCV 90.6 88.2 90.4  --  90.4  --  88.1  --  87.9 89.3  PLT 38* 63* 50*  --  44*  --  88*  --  71* 60*   < > = values in this interval not displayed.   Cardiac Enzymes: No results for input(s): CKTOTAL, CKMB, CKMBINDEX, TROPONINI in the last 168 hours. BNP: BNP (last 3 results) No results for input(s): BNP in the last 8760 hours.  ProBNP (last 3 results) No results for input(s): PROBNP in the last 8760 hours.  CBG: No results for input(s): GLUCAP in the last 168 hours.     Signed:  Domenic Polite MD.  [facility] 10/03/2018, 2:36 PM

## 2018-10-04 NOTE — Progress Notes (Signed)
Physical Therapy Treatment Patient Details Name: Summer Williams MRN: [id number] DOB: 1945/12/11 Today's Date: 10/04/2018    History of Present Illness Summer Williams is a 73 y.o. female with history of COPD, hyperlipidemia, breast cancer, lung cancer, Schizoaffective disorder, thrombocytopenia had a fall at home after she tripped.  She hit her head but did not lose consciousness. Found to have right prox humerus fx --non-operative. Right distal humerus fx -ORIF 4/30.Right wrist fx--non-operative management. Bilateral subdural hematoma with right periorbital hematoma     PT Comments    Patient progressing slowly towards PT goals. Pt lethargic initially due to not sleeping well last night. Tolerated bed mobility, transfers and gait training with Min A for balance and safety. Increased time to perform all mobility. Pt's gown and bed wet upon arrival so required changing gown and adjusting sling. Pt anxious about going home. Will need hands on assist for OOB mobility at home. HR ranged from 105-137 bpm during session. Will continue to follow and progress as tolerated.    Follow Up Recommendations  Home health PT;Supervision/Assistance - 24 hour     Equipment Recommendations  3in1 (PT);Other (comment)(family may purchase cane)    Recommendations for Other Services       Precautions / Restrictions Precautions Precautions: Fall Required Braces or Orthoses: Sling Restrictions Weight Bearing Restrictions: Yes RUE Weight Bearing: Non weight bearing Other Position/Activity Restrictions: Sling and gentle motion ok    Mobility  Bed Mobility Overal bed mobility: Needs Assistance Bed Mobility: Sidelying to Sit   Sidelying to sit: Mod assist;HOB elevated Supine to sit: (P) HOB elevated;Mod assist     General bed mobility comments: Assist to elevate trunk to get to EOB with increased time. Able to scoot bottoms towards EOB.  Transfers Overall transfer level: Needs  assistance Equipment used: 1 person hand held assist Transfers: Sit to/from Stand Sit to Stand: Min assist         General transfer comment: Assist to power to standing and for balance once upright. Slow to rise, reports weakness in BLEs. Transferred to chair post ambulation.  Ambulation/Gait Ambulation/Gait assistance: Min assist;Min guard Gait Distance (Feet): 40 Feet(+ 15') Assistive device: (02 tank) Gait Pattern/deviations: Step-to pattern;Step-through pattern;Decreased step length - right;Decreased step length - left;Decreased stride length;Shuffle;Trunk flexed Gait velocity: decreased   General Gait Details: Slow, cautious and shuffling like gait holding onto 02 tank for support; 2 standing rest breaks. HR ranged from 105-137 bpm. Sp02 >90% on 3Lmin 02.    Stairs             Wheelchair Mobility    Modified Rankin (Stroke Patients Only)       Balance Overall balance assessment: Needs assistance Sitting-balance support: No upper extremity supported;Feet supported Sitting balance-Leahy Scale: Fair     Standing balance support: During functional activity;Single extremity supported Standing balance-Leahy Scale: Poor Standing balance comment: Requires UE support in standing esp for dynamic tasks.                            Cognition Arousal/Alertness: Lethargic Behavior During Therapy: WFL for tasks assessed/performed Overall Cognitive Status: Within Functional Limits for tasks assessed                                 General Comments: Did not sleep well last night, anxious about going home.      Exercises  General Comments        Pertinent Vitals/Pain Pain Assessment: Faces Faces Pain Scale: Hurts a little bit Pain Location: R UE Pain Descriptors / Indicators: Aching;Sore Pain Intervention(s): Monitored during session;Repositioned    Home Living                      Prior Function            PT Goals  (current goals can now be found in the care plan section) Progress towards PT goals: Progressing toward goals    Frequency    Min 3X/week      PT Plan Current plan remains appropriate    Co-evaluation              AM-PAC PT "6 Clicks" Mobility   Outcome Measure  Help needed turning from your back to your side while in a flat bed without using bedrails?: A Little Help needed moving from lying on your back to sitting on the side of a flat bed without using bedrails?: A Little Help needed moving to and from a bed to a chair (including a wheelchair)?: A Little Help needed standing up from a chair using your arms (e.g., wheelchair or bedside chair)?: A Little Help needed to walk in hospital room?: A Little Help needed climbing 3-5 steps with a railing? : A Lot 6 Click Score: 17    End of Session Equipment Utilized During Treatment: Gait belt;Oxygen;Other (comment)(sling) Activity Tolerance: Patient tolerated treatment well Patient left: in chair;with call bell/phone within reach;with chair alarm set Nurse Communication: Mobility status PT Visit Diagnosis: Other abnormalities of gait and mobility (R26.89);Muscle weakness (generalized) (M62.81);Pain Pain - Right/Left: Right Pain - part of body: Shoulder;Arm     Time: 4417-1278 PT Time Calculation (min) (ACUTE ONLY): 44 min  Charges:  $Gait Training: 8-22 mins $Therapeutic Activity: 23-37 mins                     Wray Kearns, [place], DPT Acute Rehabilitation Services Pager [phone] [place] 10/04/2018, 9:52 AM

## 2018-10-04 NOTE — Progress Notes (Signed)
IV removed, discharge instructions reviewed with patient.  Patient to be discharged home with family.

## 2018-10-04 NOTE — TOC Transition Note (Signed)
Transition of Care [facility]) - CM/SW Discharge Note   Patient Details  Name: Summer Williams MRN: [id number] Date of Birth: 13-Mar-1946  Transition of Care [facility]) CM/SW Contact:  Ella Bodo, RN Phone Number: 10/04/2018, 3:16 PM   Clinical Narrative:  Pt admitted on 09/27/2018 s/p fall at home with Rt proximal humerus, Rt distal humerus fx, and bilateral SDH with Rt periorbital hematoma.  PTA, pt independent, lives at home alone.  Pt medically stable for dc home today with [facility] services through [facility] and her son, who will be staying with her.  Confirmed that hospital bed will be delivered today from [place], between 3 and 7 pm.  Family also requesting 3 in 1 [facility], which will be delivered to pt's room by [place] prior to dc, as hospital bed has already been dispatched to home.       Final next level of care: [place] Barriers to Discharge: No Barriers Identified   Patient Goals and CMS Choice Patient states their goals for this hospitalization and ongoing recovery are:: to go home CMS Medicare.gov Compare Post Acute Care list provided to:: Patient Choice offered to / list presented to : Patient                        Discharge Plan and Services   Discharge Planning Services: CM Consult            DME Arranged: 3-N-1 DME Agency: [employer] Date DME Agency Contacted: 10/02/18 Time DME Agency Contacted: [id number] Representative spoke with at DME Agency: [place]: PT, OT [facility] Agency: [place] Date [place]: 10/03/18 Time [place]: 1031 Representative spoke with at [place]: Sharmon Revere, RN  Social Determinants of Health ([place]) Interventions     Readmission Risk Interventions Readmission Risk Prevention Plan 10/04/2018  Transportation Screening Complete  PCP or Specialist Appt within 5-7 Days Complete  Home Care Screening Complete  Medication Review (RN CM) Complete  Some recent  data might be hidden   Reinaldo Raddle, RN, BSN  Trauma/Neuro ICU Case Manager [phone]

## 2018-10-04 NOTE — Progress Notes (Signed)
Physical Therapy Treatment Patient Details Name: Summer Williams MRN: [id number] DOB: [birth year] Today's Date: 10/04/2018    History of Present Illness Summer Williams is a 73 y.o. female with history of COPD, hyperlipidemia, breast cancer, lung cancer, Schizoaffective disorder, thrombocytopenia had a fall at home after she tripped.  She hit her head but did not lose consciousness. Found to have right prox humerus fx --non-operative. Right distal humerus fx -ORIF 4/30.Right wrist fx--non-operative management. Bilateral subdural hematoma with right periorbital hematoma     PT Comments    Performed PM session for stair training as pt's daughter worried about pt's ability to negotiate stairs at home. Tolerated stair training with Min guard-Min A for balance/safety with left hand rail. Educated pt on how family can assist pt getting into home safely. Fatigues after stair negotiation requiring rest break with cues for pursed lip breathing. HR up to 140s bpm and Sp02 ranged from 82-95% on 3L/min 02. Awaiting hospital bed to be delivered at home. Will follow.    Follow Up Recommendations  Home health PT;Supervision/Assistance - 24 hour     Equipment Recommendations  3in1 (PT);Other (comment)(family may purchase cane)    Recommendations for Other Services       Precautions / Restrictions Precautions Precautions: Fall Required Braces or Orthoses: Sling Restrictions Weight Bearing Restrictions: Yes RUE Weight Bearing: Non weight bearing Other Position/Activity Restrictions: Sling and gentle motion ok    Mobility  Bed Mobility Overal bed mobility: Needs Assistance Bed Mobility: Supine to Sit;Sit to Supine     Supine to sit: Min guard;HOB elevated Sit to supine: Min guard;HOB elevated   General bed mobility comments: Able to get to EOB with heavy reliance on rail for support; increased time. No assist needed.   Transfers Overall transfer level: Needs assistance Equipment used:  None Transfers: Sit to/from Stand Sit to Stand: Min guard         General transfer comment: Min guard for safety. Stood from [employer], SPT bed to/from chair x2, from chair x2.   Ambulation/Gait Ambulation/Gait assistance: Min assist Gait Distance (Feet): 20 Feet(x2 bouts) Assistive device: (02 tank) Gait Pattern/deviations: Step-to pattern;Step-through pattern;Decreased step length - right;Decreased step length - left;Decreased stride length;Shuffle;Trunk flexed Gait velocity: decreased   General Gait Details: Slow, cautious and shuffling like gait holding onto 02 tank for support; HR up to 140s bpm. Standing rest break needed. Sp02 ranged from 82-95% on 3L/min 02. Cues for pursed lip breathing.   Stairs Stairs: Yes Stairs assistance: Min guard;Min assist Stair Management: One rail Left Number of Stairs: 4 General stair comments: Cues for technique and safety. Educated pt on how family member can assist at home.   Wheelchair Mobility    Modified Rankin (Stroke Patients Only)       Balance Overall balance assessment: Needs assistance Sitting-balance support: No upper extremity supported;Feet supported Sitting balance-Leahy Scale: Fair     Standing balance support: No upper extremity supported;During functional activity Standing balance-Leahy Scale: Fair Standing balance comment: standing at sink to brush teeth                            Cognition Arousal/Alertness: Awake/alert Behavior During Therapy: WFL for tasks assessed/performed Overall Cognitive Status: Within Functional Limits for tasks assessed                                 General Comments:  Did not sleep well last night, anxious about going home.      Exercises      General Comments        Pertinent Vitals/Pain Pain Assessment: No/denies pain Pain Score: 3  Pain Location: R UE (elbow) Pain Descriptors / Indicators: Aching;Sore Pain Intervention(s): Monitored during  session;Repositioned    Home Living                      Prior Function            PT Goals (current goals can now be found in the care plan section) Acute Rehab PT Goals Patient Stated Goal: decrease pain, return to PLOF Progress towards PT goals: Progressing toward goals    Frequency    Min 3X/week      PT Plan Current plan remains appropriate    Co-evaluation              AM-PAC PT "6 Clicks" Mobility   Outcome Measure  Help needed turning from your back to your side while in a flat bed without using bedrails?: A Little Help needed moving from lying on your back to sitting on the side of a flat bed without using bedrails?: A Little Help needed moving to and from a bed to a chair (including a wheelchair)?: A Little Help needed standing up from a chair using your arms (e.g., wheelchair or bedside chair)?: A Little Help needed to walk in hospital room?: A Little Help needed climbing 3-5 steps with a railing? : A Little 6 Click Score: 18    End of Session Equipment Utilized During Treatment: Gait belt;Oxygen;Other (comment)(sling) Activity Tolerance: Patient tolerated treatment well;Treatment limited secondary to medical complications (Comment)(drop inS p02, elevated HR) Patient left: in bed;with bed alarm set;with call bell/phone within reach Nurse Communication: Mobility status PT Visit Diagnosis: Other abnormalities of gait and mobility (R26.89);Muscle weakness (generalized) (M62.81)     Time: 1344-1410 PT Time Calculation (min) (ACUTE ONLY): 26 min  Charges:  $Gait Training: 8-22 mins $Therapeutic Activity: 8-22 mins                     Wray Kearns, [place], DPT Acute Rehabilitation Services Pager [phone] Office [place] 10/04/2018, 3:45 PM

## 2018-10-04 NOTE — Progress Notes (Signed)
Occupational Therapy Treatment Patient Details Name: Summer Williams MRN: [id number] DOB: 24-Oct-1945 Today's Date: 10/04/2018    History of present illness Summer Williams is a 73 y.o. female with history of COPD, hyperlipidemia, breast cancer, lung cancer, Schizoaffective disorder, thrombocytopenia had a fall at home after she tripped.  She hit her head but did not lose consciousness. Found to have right prox humerus fx --non-operative. Right distal humerus fx -ORIF 4/30.Right wrist fx--non-operative management. Bilateral subdural hematoma with right periorbital hematoma    OT comments  This 73 yo female admitted with above presents to acute OT with making progress with grooming with being able to start task standing at sink in bathroom and also working on being up on her feet more and ambulating. She will continue to benefit from acute OT with follow up HHOT and 24 hour S/prn A.   Follow Up Recommendations  Home health OT;Supervision/Assistance - 24 hour;Other (comment)    Equipment Recommendations  3 in 1 bedside commode;Hospital bed       Precautions / Restrictions Precautions Precautions: Fall Required Braces or Orthoses: Sling Restrictions Weight Bearing Restrictions: Yes RUE Weight Bearing: Non weight bearing Other Position/Activity Restrictions: Sling and gentle motion ok       Mobility Bed Mobility               General bed mobility comments: Pt up in recliner upon arrival  Transfers Overall transfer level: Needs assistance Equipment used: 1 person hand held assist Transfers: Sit to/from Stand Sit to Stand: Min guard         General transfer comment: pt ambulated to bathroom to brush teeth, then do door of room and back to recliner with min A (slow stedy steps but close together)    Balance Overall balance assessment: Needs assistance Sitting-balance support: No upper extremity supported;Feet supported Sitting balance-Leahy Scale: Fair     Standing  balance support: No upper extremity supported;During functional activity Standing balance-Leahy Scale: Fair Standing balance comment: standing at sink to brush teeth                           ADL either performed or assessed with clinical judgement   ADL Overall ADL's : Needs assistance/impaired Eating/Feeding: Set up;Sitting   Grooming: Oral care;Minimal assistance;Sitting Grooming Details (indicate cue type and reason): did have in standing but then got too winded to continue in standing so had to sit down                                     Vision Baseline Vision/History: Wears glasses(right arm of glasses is bent--placed in her belongings bag) Wears Glasses: At all times            Cognition Arousal/Alertness: Awake/alert Behavior During Therapy: WFL for tasks assessed/performed Overall Cognitive Status: Within Functional Limits for tasks assessed                                 General Comments: Did not sleep well last night, anxious about going home.                   Pertinent Vitals/ Pain       Pain Assessment: 0-10 Pain Score: 3  Pain Location: R UE (elbow) Pain Descriptors / Indicators: Aching;Sore Pain Intervention(s): Monitored during  session;Repositioned         Frequency  Min 3X/week        Progress Toward Goals  OT Goals(current goals can now be found in the care plan section)  Progress towards OT goals: Progressing toward goals  Acute Rehab OT Goals Patient Stated Goal: decrease pain, return to PLOF OT Goal Formulation: With patient/family Time For Goal Achievement: 10/15/18 Potential to Achieve Goals: Good  Plan Discharge plan remains appropriate       AM-PAC OT "6 Clicks" Daily Activity     Outcome Measure   Help from another person eating meals?: A Little Help from another person taking care of personal grooming?: A Lot Help from another person toileting, which includes using toliet,  bedpan, or urinal?: A Lot Help from another person bathing (including washing, rinsing, drying)?: A Lot Help from another person to put on and taking off regular upper body clothing?: A Lot Help from another person to put on and taking off regular lower body clothing?: Total 6 Click Score: 12    End of Session Equipment Utilized During Treatment: Gait belt;Oxygen;Other (comment)(3 liters (same as at home))  OT Visit Diagnosis: Unsteadiness on feet (R26.81);Other abnormalities of gait and mobility (R26.89);Muscle weakness (generalized) (M62.81);History of falling (Z91.81);Pain Pain - Right/Left: Right Pain - part of body: Arm   Activity Tolerance Patient tolerated treatment well   Patient Left in chair;with call bell/phone within reach(eating breakfast)   Nurse Communication (feel like pt sounds worse than on Friday as far as congestion)        Time: 5217-4715 OT Time Calculation (min): 31 min  Charges: OT General Charges $OT Visit: 1 Visit OT Treatments $Self Care/Home Management : 23-37 mins  [place], OTR/L Acute [employer] Pager [phone] Office [phone]      Almon Register 10/04/2018, 3:25 PM

## 2018-10-05 NOTE — Progress Notes (Signed)
HEMATOLOGY-ONCOLOGY TELEPHONE VISIT PROGRESS NOTE  I connected with Roselyn Meier on 10/06/2018 at  2:30 PM EDT by telephone and verified that I am speaking with the correct person using two identifiers.  I discussed the limitations, risks, security and privacy concerns of performing an evaluation and management service by telephone and the availability of in person appointments.  I also discussed with the patient that there may be a patient responsible charge related to this service. The patient expressed understanding and agreed to proceed.   History of Present Illness: Summer Williams is a 73 y.o. female with above-mentioned history of breast cancer and lung cancer who is currently not receiving any treatment or routine scans. She has known metastatic disease but has not progressed clinically. Her most recent mammogram on 01/05/18 showed no evidence of malignancy. She presents today over the phone for annual follow-up.  She was in the hospital recently for subdural hematoma and fracture of the right humerus and radius.  She underwent surgery.  During the hospitalization she was noted to have severe anemia and thrombocytopenia.  She received blood transfusion and platelet transfusion.  She complains of severe fatigue as well as shortness of breath to minimal exertion and tachycardia to minimal exertion.  Her son and daughter also participated in the phone call.      Cancer of lower-inner quadrant of left female breast ([place])   01/05/2014 Initial Diagnosis    Ultrasound-guided core needle biopsy: Cancer of lower-inner quadrant of left female breast, ER 100%, PR 90%, Ki-67: 30%, HER-2 negative (Ratio 1.02), grade 2    01/13/2014 Breast MRI    1.6 cm left breast abnormality, no abnormal lymph nodes.    02/09/2014 Surgery    Left breast lumpectomy: Invasive grade 2 ductal carcinoma 1.5 cm with low-grade grade DCIS posterior aspect of the inferior margin close (0.15 cm), 3 SLN negative, Oncotype DX  recurrence score 9 low risk (7% ROR)    03/13/2014 - 05/02/2014 Radiation Therapy    Adjuvant radiation therapy    06/02/2014 -  Anti-estrogen oral therapy    Arimidex 1 mg daily, plan is for 5 years of treatment    12/19/2014 Initial Biopsy    Left upper arm soft tissue biopsy: Fat necrosis and fibrosis no evidence of malignancy    11/24/2016 PET scan    Radiation changes in both lungs, no evidence of recurrence, stable left paratracheal lesion     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 ([place])   11/10/2014 Initial Diagnosis    Right lung biopsy adenocarcinoma TTF-1 strong diffuse expression, CK 7 strong diffuse expression GCDFP negative; estrogen receptor weak    12/13/2014 - 01/19/2015 Radiation Therapy    Right upper/middle lobe of lung, 2.6 Gy/frac for total of 70.2 Gy (definitive radiation)    03/12/2015 Imaging    Right middle and upper lobe spiculated groundglass mass measures 2.7 x 2.23.3 cm previously was 3.4 x 2.44.2 cm, 3 mm right middle lobe nodule, chronic left lower lobe nodule 3 mm    07/31/2015 Imaging    CT chest :similar to slight decrease in the sub-solitary pulmonary nodule (2.3 x 1.7 cm was previously 2.7 x 2.2 cm) in the right upper and middle lobes, treatment-related changes, unchanged pulmonary nodules in right and left lungs    02/15/2016 PET scan    2.0 x 1.5 cm spiculated RML nodule. Hypermetabolic subpleural nodularity along the lateral right chest wall, suspicious for pleural-based metastasis. Associated hypermetabolism of the overlying chest wall  musculature Suspected bone met 7th rib    03/31/2016 - 04/14/2016 Radiation Therapy    Palliative radiation to rib     Observations/Objective:  Severely fatigued and short of breath with significant decline in performance status.   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 Hypermetabolic subpleural nodularity along the lateral right chest wall, suspicious for pleural-based metastasis. 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 There is not enough biopsy material for molecular testing. Guardant 360 is negative forEGFR mutation blood test. She was noted to have NRAS (G13R), TP53 (l162F) and NF1 (S1842f) Patient refused chemo -------------------------------------------------------------------------------------------------------------------------- Goals of care: Patient expressly prohibited [place] obtaining routine scans are doing any procedures if it is not going to help her comfort care.  Therefore we have not performed restaging evaluations.  Hospitalization 09/26/2018-10/03/2018: Subdural hematoma, right proximal humerus fracture, right distal humerus fracture, right distal radius fracture status post surgery Severe anemia hemoglobin 6.8 during hospitalization discharged at 9.4 Severe thrombocytopenia platelet count down to 38 discharged to 60 Required blood and platelet transfusions.  Current symptoms: 1.  Worsening shortness of breath: I discussed with the family the differential diagnosis is between anemia, COPD exacerbation, PE: We do not intend to do any aggressive therapy and therefore CT chest is not been recommended at this time.   I instructed them to increase the O2 to 4 L. 2.  Anemia and thrombocytopenia: We will request home health to obtain blood work and to see if she needs more blood transfusion. We discussed at length about anemia and thrombocytopenia.  DNR CC We will try to obtain blood work next week on 10/12/2018 and I will call her with the results of these tests and then decide if she needs any transfusions.    I discussed the assessment and treatment plan with the patient. The patient was provided an opportunity to ask questions and all were answered. The patient agreed with the plan and demonstrated an understanding of the instructions. The patient was advised to call back or seek an in-person evaluation if the symptoms worsen or if the condition fails to improve as anticipated.   I provided 30 minutes of non-face-to-face time during this encounter.   VRulon Eisenmenger MD 10/06/2018    I, Molly Dorshimer, am acting as scribe for VNicholas Lose MD.  I have reviewed the above documentation for accuracy and completeness, and I agree with the above.

## 2018-10-06 ENCOUNTER — Inpatient Hospital Stay: Payer: PPO | Attending: Hematology and Oncology | Admitting: Hematology and Oncology

## 2018-10-06 ENCOUNTER — Other Ambulatory Visit: Payer: Self-pay | Admitting: *Deleted

## 2018-10-06 ENCOUNTER — Other Ambulatory Visit: Payer: PPO

## 2018-10-06 DIAGNOSIS — Z9981 Dependence on supplemental oxygen: Secondary | ICD-10-CM | POA: Diagnosis not present

## 2018-10-06 DIAGNOSIS — M12811 Other specific arthropathies, not elsewhere classified, right shoulder: Secondary | ICD-10-CM | POA: Diagnosis not present

## 2018-10-06 DIAGNOSIS — D649 Anemia, unspecified: Secondary | ICD-10-CM | POA: Diagnosis not present

## 2018-10-06 DIAGNOSIS — Z170 Estrogen receptor positive status [ER+]: Secondary | ICD-10-CM

## 2018-10-06 DIAGNOSIS — J449 Chronic obstructive pulmonary disease, unspecified: Secondary | ICD-10-CM | POA: Diagnosis not present

## 2018-10-06 DIAGNOSIS — C50312 Malignant neoplasm of lower-inner quadrant of left female breast: Secondary | ICD-10-CM

## 2018-10-06 DIAGNOSIS — S42401D Unspecified fracture of lower end of right humerus, subsequent encounter for fracture with routine healing: Secondary | ICD-10-CM | POA: Diagnosis not present

## 2018-10-06 DIAGNOSIS — J441 Chronic obstructive pulmonary disease with (acute) exacerbation: Secondary | ICD-10-CM | POA: Diagnosis not present

## 2018-10-06 DIAGNOSIS — W19XXXD Unspecified fall, subsequent encounter: Secondary | ICD-10-CM | POA: Diagnosis not present

## 2018-10-06 DIAGNOSIS — S065X0D Traumatic subdural hemorrhage without loss of consciousness, subsequent encounter: Secondary | ICD-10-CM | POA: Diagnosis not present

## 2018-10-06 DIAGNOSIS — Z923 Personal history of irradiation: Secondary | ICD-10-CM | POA: Diagnosis not present

## 2018-10-06 DIAGNOSIS — M85821 Other specified disorders of bone density and structure, right upper arm: Secondary | ICD-10-CM | POA: Diagnosis not present

## 2018-10-06 DIAGNOSIS — S065X9A Traumatic subdural hemorrhage with loss of consciousness of unspecified duration, initial encounter: Secondary | ICD-10-CM

## 2018-10-06 DIAGNOSIS — D696 Thrombocytopenia, unspecified: Secondary | ICD-10-CM

## 2018-10-06 DIAGNOSIS — S065XAA Traumatic subdural hemorrhage with loss of consciousness status unknown, initial encounter: Secondary | ICD-10-CM

## 2018-10-06 DIAGNOSIS — Z853 Personal history of malignant neoplasm of breast: Secondary | ICD-10-CM | POA: Diagnosis not present

## 2018-10-06 DIAGNOSIS — F1011 Alcohol abuse, in remission: Secondary | ICD-10-CM | POA: Diagnosis not present

## 2018-10-06 DIAGNOSIS — C342 Malignant neoplasm of middle lobe, bronchus or lung: Secondary | ICD-10-CM | POA: Diagnosis not present

## 2018-10-06 DIAGNOSIS — I10 Essential (primary) hypertension: Secondary | ICD-10-CM | POA: Diagnosis not present

## 2018-10-06 DIAGNOSIS — F209 Schizophrenia, unspecified: Secondary | ICD-10-CM | POA: Diagnosis not present

## 2018-10-06 DIAGNOSIS — S52611D Displaced fracture of right ulna styloid process, subsequent encounter for closed fracture with routine healing: Secondary | ICD-10-CM | POA: Diagnosis not present

## 2018-10-06 DIAGNOSIS — S42251D Displaced fracture of greater tuberosity of right humerus, subsequent encounter for fracture with routine healing: Secondary | ICD-10-CM | POA: Diagnosis not present

## 2018-10-06 DIAGNOSIS — J9610 Chronic respiratory failure, unspecified whether with hypoxia or hypercapnia: Secondary | ICD-10-CM | POA: Diagnosis not present

## 2018-10-06 NOTE — Assessment & Plan Note (Signed)
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 Hypermetabolic subpleural nodularity along the lateral right chest wall, suspicious for pleural-based metastasis. 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 There is not enough biopsy material for molecular testing. Guardant 360 is negative forEGFR mutation blood test. She was noted to have NRAS (G13R), TP53 (l162F) and NF1 (S1815f) Patient refused chemo -------------------------------------------------------------------------------------------------------------------------- Goals of care: Patient expressly prohibited [place] obtaining routine scans are doing any procedures if it is not going to help her comfort care.  Therefore we have not performed restaging evaluations.  Hospitalization 09/26/2018-10/03/2018: Subdural hematoma, right proximal humerus fracture, right distal humerus fracture, right distal radius fracture status post surgery Severe anemia hemoglobin 6.8 during hospitalization discharged at 9.4 Severe thrombocytopenia platelet count down to 38 discharged to 60 Required blood and platelet transfusions.  Current symptoms: 1.  Worsening shortness of breath: I discussed with the family the differential diagnosis is between anemia, COPD exacerbation,  PE: We do not intend to do any aggressive therapy and therefore CT chest is not been recommended at this time.  I instructed them to increase the O2 to 4 L. 2.  Anemia and thrombocytopenia: We will request home health to obtain blood work and to see if she needs more blood transfusion. We discussed at length about anemia and thrombocytopenia.  DNR CC We will try to obtain blood work next week on 10/12/2018 and I will call her with the results of these tests and then decide if she needs any transfusions.

## 2018-10-06 NOTE — Progress Notes (Signed)
Per Dr. Lindi Adie referral made to [facility] to go to pts home on 10/12/2018 to have CBC with diff, CMP, and blood bank hold labs drawn.  Referral faxed to [place] with [facility] [phone])

## 2018-10-07 ENCOUNTER — Telehealth: Payer: Self-pay

## 2018-10-07 ENCOUNTER — Telehealth: Payer: Self-pay | Admitting: Internal Medicine

## 2018-10-07 DIAGNOSIS — J449 Chronic obstructive pulmonary disease, unspecified: Secondary | ICD-10-CM | POA: Diagnosis not present

## 2018-10-07 NOTE — Telephone Encounter (Signed)
Ok to approve. Tell her not to leave messages.

## 2018-10-07 NOTE — Telephone Encounter (Signed)
Santiago Glad, from [facility] called to notify that patient is currently utilizing [facility] for their home health needs, they will not be able to do blood draw since patient is under active care with another agency.    Nurse re-faxed orders for blood draw to [facility] at [phone].

## 2018-10-07 NOTE — Telephone Encounter (Signed)
[place] [phone]   Mickel Baas LVM on overnight answering machine that they had done Home Health evaluation for Physical Therapy per [facility] discharge instructions from a recent fall.  They need orders for Physical Therapy 2 times a week  for 4 week and then once a week for 4 weeks  Okay to leave a VM on above phone number.

## 2018-10-07 NOTE — Telephone Encounter (Signed)
Left voicemail letting her know about this.

## 2018-10-08 ENCOUNTER — Other Ambulatory Visit: Payer: Self-pay

## 2018-10-08 ENCOUNTER — Emergency Department (HOSPITAL_COMMUNITY): Payer: PPO

## 2018-10-08 ENCOUNTER — Inpatient Hospital Stay (HOSPITAL_COMMUNITY)
Admission: EM | Admit: 2018-10-08 | Discharge: 2018-10-10 | DRG: 083 | Disposition: A | Discharge status: Hospice | Payer: PPO | Source: Ambulatory Visit | Attending: Internal Medicine | Admitting: Internal Medicine

## 2018-10-08 ENCOUNTER — Encounter (HOSPITAL_COMMUNITY): Payer: Self-pay | Admitting: Emergency Medicine

## 2018-10-08 ENCOUNTER — Telehealth: Payer: Self-pay | Admitting: *Deleted

## 2018-10-08 ENCOUNTER — Ambulatory Visit (INDEPENDENT_AMBULATORY_CARE_PROVIDER_SITE_OTHER): Payer: PPO | Admitting: Internal Medicine

## 2018-10-08 ENCOUNTER — Encounter: Payer: Self-pay | Admitting: Internal Medicine

## 2018-10-08 DIAGNOSIS — F259 Schizoaffective disorder, unspecified: Secondary | ICD-10-CM | POA: Diagnosis not present

## 2018-10-08 DIAGNOSIS — R000 Tachycardia, unspecified: Secondary | ICD-10-CM | POA: Diagnosis not present

## 2018-10-08 DIAGNOSIS — S066X9A Traumatic subarachnoid hemorrhage with loss of consciousness of unspecified duration, initial encounter: Principal | ICD-10-CM | POA: Diagnosis present

## 2018-10-08 DIAGNOSIS — S52571D Other intraarticular fracture of lower end of right radius, subsequent encounter for closed fracture with routine healing: Secondary | ICD-10-CM

## 2018-10-08 DIAGNOSIS — S42251D Displaced fracture of greater tuberosity of right humerus, subsequent encounter for fracture with routine healing: Secondary | ICD-10-CM

## 2018-10-08 DIAGNOSIS — Z853 Personal history of malignant neoplasm of breast: Secondary | ICD-10-CM

## 2018-10-08 DIAGNOSIS — Z03818 Encounter for observation for suspected exposure to other biological agents ruled out: Secondary | ICD-10-CM | POA: Diagnosis not present

## 2018-10-08 DIAGNOSIS — W19XXXA Unspecified fall, initial encounter: Secondary | ICD-10-CM | POA: Diagnosis present

## 2018-10-08 DIAGNOSIS — J9611 Chronic respiratory failure with hypoxia: Secondary | ICD-10-CM | POA: Diagnosis not present

## 2018-10-08 DIAGNOSIS — S2241XD Multiple fractures of ribs, right side, subsequent encounter for fracture with routine healing: Secondary | ICD-10-CM | POA: Diagnosis not present

## 2018-10-08 DIAGNOSIS — E785 Hyperlipidemia, unspecified: Secondary | ICD-10-CM | POA: Diagnosis not present

## 2018-10-08 DIAGNOSIS — C7951 Secondary malignant neoplasm of bone: Secondary | ICD-10-CM | POA: Diagnosis present

## 2018-10-08 DIAGNOSIS — D72829 Elevated white blood cell count, unspecified: Secondary | ICD-10-CM | POA: Diagnosis present

## 2018-10-08 DIAGNOSIS — Z20828 Contact with and (suspected) exposure to other viral communicable diseases: Secondary | ICD-10-CM | POA: Diagnosis present

## 2018-10-08 DIAGNOSIS — Z85118 Personal history of other malignant neoplasm of bronchus and lung: Secondary | ICD-10-CM | POA: Diagnosis not present

## 2018-10-08 DIAGNOSIS — S065XAA Traumatic subdural hemorrhage with loss of consciousness status unknown, initial encounter: Secondary | ICD-10-CM

## 2018-10-08 DIAGNOSIS — D696 Thrombocytopenia, unspecified: Secondary | ICD-10-CM | POA: Diagnosis not present

## 2018-10-08 DIAGNOSIS — S52611D Displaced fracture of right ulna styloid process, subsequent encounter for closed fracture with routine healing: Secondary | ICD-10-CM | POA: Diagnosis not present

## 2018-10-08 DIAGNOSIS — Z8781 Personal history of (healed) traumatic fracture: Secondary | ICD-10-CM

## 2018-10-08 DIAGNOSIS — Z79811 Long term (current) use of aromatase inhibitors: Secondary | ICD-10-CM

## 2018-10-08 DIAGNOSIS — R51 Headache: Secondary | ICD-10-CM | POA: Diagnosis not present

## 2018-10-08 DIAGNOSIS — C342 Malignant neoplasm of middle lobe, bronchus or lung: Secondary | ICD-10-CM | POA: Diagnosis not present

## 2018-10-08 DIAGNOSIS — Z923 Personal history of irradiation: Secondary | ICD-10-CM | POA: Diagnosis not present

## 2018-10-08 DIAGNOSIS — Z7982 Long term (current) use of aspirin: Secondary | ICD-10-CM

## 2018-10-08 DIAGNOSIS — R0689 Other abnormalities of breathing: Secondary | ICD-10-CM | POA: Diagnosis not present

## 2018-10-08 DIAGNOSIS — F329 Major depressive disorder, single episode, unspecified: Secondary | ICD-10-CM | POA: Diagnosis present

## 2018-10-08 DIAGNOSIS — I609 Nontraumatic subarachnoid hemorrhage, unspecified: Secondary | ICD-10-CM | POA: Diagnosis not present

## 2018-10-08 DIAGNOSIS — E559 Vitamin D deficiency, unspecified: Secondary | ICD-10-CM | POA: Diagnosis present

## 2018-10-08 DIAGNOSIS — D0512 Intraductal carcinoma in situ of left breast: Secondary | ICD-10-CM | POA: Diagnosis not present

## 2018-10-08 DIAGNOSIS — C50312 Malignant neoplasm of lower-inner quadrant of left female breast: Secondary | ICD-10-CM | POA: Diagnosis present

## 2018-10-08 DIAGNOSIS — Z66 Do not resuscitate: Secondary | ICD-10-CM | POA: Diagnosis not present

## 2018-10-08 DIAGNOSIS — Z87891 Personal history of nicotine dependence: Secondary | ICD-10-CM

## 2018-10-08 DIAGNOSIS — Z801 Family history of malignant neoplasm of trachea, bronchus and lung: Secondary | ICD-10-CM | POA: Diagnosis not present

## 2018-10-08 DIAGNOSIS — R404 Transient alteration of awareness: Secondary | ICD-10-CM | POA: Diagnosis not present

## 2018-10-08 DIAGNOSIS — S066X0A Traumatic subarachnoid hemorrhage without loss of consciousness, initial encounter: Secondary | ICD-10-CM | POA: Diagnosis not present

## 2018-10-08 DIAGNOSIS — Z9981 Dependence on supplemental oxygen: Secondary | ICD-10-CM

## 2018-10-08 DIAGNOSIS — J441 Chronic obstructive pulmonary disease with (acute) exacerbation: Secondary | ICD-10-CM | POA: Diagnosis not present

## 2018-10-08 DIAGNOSIS — R0602 Shortness of breath: Secondary | ICD-10-CM | POA: Diagnosis not present

## 2018-10-08 DIAGNOSIS — S065X9A Traumatic subdural hemorrhage with loss of consciousness of unspecified duration, initial encounter: Secondary | ICD-10-CM

## 2018-10-08 DIAGNOSIS — R42 Dizziness and giddiness: Secondary | ICD-10-CM | POA: Diagnosis not present

## 2018-10-08 DIAGNOSIS — S065X0A Traumatic subdural hemorrhage without loss of consciousness, initial encounter: Secondary | ICD-10-CM | POA: Diagnosis not present

## 2018-10-08 DIAGNOSIS — S42251A Displaced fracture of greater tuberosity of right humerus, initial encounter for closed fracture: Secondary | ICD-10-CM | POA: Diagnosis present

## 2018-10-08 DIAGNOSIS — Z515 Encounter for palliative care: Secondary | ICD-10-CM | POA: Diagnosis not present

## 2018-10-08 DIAGNOSIS — D649 Anemia, unspecified: Secondary | ICD-10-CM

## 2018-10-08 DIAGNOSIS — S065X0D Traumatic subdural hemorrhage without loss of consciousness, subsequent encounter: Secondary | ICD-10-CM | POA: Diagnosis not present

## 2018-10-08 DIAGNOSIS — Z8249 Family history of ischemic heart disease and other diseases of the circulatory system: Secondary | ICD-10-CM | POA: Diagnosis not present

## 2018-10-08 LAB — POCT I-STAT EG7
Acid-Base Excess: 16 mmol/L — ABNORMAL HIGH (ref 0.0–2.0)
Bicarbonate: 39.9 mmol/L — ABNORMAL HIGH (ref 20.0–28.0)
Calcium, Ion: 1.07 mmol/L — ABNORMAL LOW (ref 1.15–1.40)
HCT: 35 % — ABNORMAL LOW (ref 36.0–46.0)
Hemoglobin: 11.9 g/dL — ABNORMAL LOW (ref 12.0–15.0)
O2 Saturation: 99 %
Potassium: 3.7 mmol/L (ref 3.5–5.1)
Sodium: 136 mmol/L (ref 135–145)
TCO2: 41 mmol/L — ABNORMAL HIGH (ref 22–32)
pCO2, Ven: 46.3 mmHg (ref 44.0–60.0)
pH, Ven: 7.543 — ABNORMAL HIGH (ref 7.250–7.430)
pO2, Ven: 128 mmHg — ABNORMAL HIGH (ref 32.0–45.0)

## 2018-10-08 LAB — COMPREHENSIVE METABOLIC PANEL
ALT: 13 U/L (ref 0–44)
AST: 18 U/L (ref 15–41)
Albumin: 3.4 g/dL — ABNORMAL LOW (ref 3.5–5.0)
Alkaline Phosphatase: 87 U/L (ref 38–126)
Anion gap: 11 (ref 5–15)
BUN: 9 mg/dL (ref 8–23)
CO2: 34 mmol/L — ABNORMAL HIGH (ref 22–32)
Calcium: 9.2 mg/dL (ref 8.9–10.3)
Chloride: 95 mmol/L — ABNORMAL LOW (ref 98–111)
Creatinine, Ser: 0.53 mg/dL (ref 0.44–1.00)
GFR calc Af Amer: >60 mL/min (ref >60)
GFR calc non Af Amer: >60 mL/min (ref >60)
Glucose, Bld: 128 mg/dL — ABNORMAL HIGH (ref 70–99)
Potassium: 3.7 mmol/L (ref 3.5–5.1)
Sodium: 140 mmol/L (ref 135–145)
Total Bilirubin: 1.3 mg/dL — ABNORMAL HIGH (ref 0.3–1.2)
Total Protein: 7.2 g/dL (ref 6.5–8.1)

## 2018-10-08 LAB — CBC WITH DIFFERENTIAL/PLATELET
Abs Immature Granulocytes: 0 10*3/uL (ref 0.00–0.07)
Basophils Absolute: 0 10*3/uL (ref 0.0–0.1)
Basophils Relative: 0 %
Eosinophils Absolute: 0 10*3/uL (ref 0.0–0.5)
Eosinophils Relative: 0 %
HCT: 36.4 % (ref 36.0–46.0)
Hemoglobin: 11.6 g/dL — ABNORMAL LOW (ref 12.0–15.0)
Lymphocytes Relative: 5 %
Lymphs Abs: 0.9 10*3/uL (ref 0.7–4.0)
MCH: 28.8 pg (ref 26.0–34.0)
MCHC: 31.9 g/dL (ref 30.0–36.0)
MCV: 90.3 fL (ref 80.0–100.0)
Monocytes Absolute: 3.3 10*3/uL — ABNORMAL HIGH (ref 0.1–1.0)
Monocytes Relative: 19 %
Neutro Abs: 13.1 10*3/uL — ABNORMAL HIGH (ref 1.7–7.7)
Neutrophils Relative %: 76 %
Platelets: 66 10*3/uL — ABNORMAL LOW (ref 150–400)
RBC: 4.03 MIL/uL (ref 3.87–5.11)
RDW: 14.5 % (ref 11.5–15.5)
WBC: 17.2 10*3/uL — ABNORMAL HIGH (ref 4.0–10.5)
nRBC: 0 % (ref 0.0–0.2)

## 2018-10-08 LAB — I-STAT CREATININE, ED: Creatinine, Ser: 0.5 mg/dL (ref 0.44–1.00)

## 2018-10-08 LAB — ETHANOL: Alcohol, Ethyl (B): <10 mg/dL (ref <10)

## 2018-10-08 LAB — SARS CORONAVIRUS 2 BY RT PCR (HOSPITAL ORDER, PERFORMED IN ~~LOC~~ HOSPITAL LAB): SARS Coronavirus 2: NEGATIVE

## 2018-10-08 LAB — AMMONIA: Ammonia: 46 umol/L — ABNORMAL HIGH (ref 9–35)

## 2018-10-08 LAB — CBG MONITORING, ED: Glucose-Capillary: 125 mg/dL — ABNORMAL HIGH (ref 70–99)

## 2018-10-08 MED ORDER — LORATADINE 10 MG PO TABS
10.0000 mg | ORAL_TABLET | Freq: Every day | ORAL | Status: DC
  Administered 2018-10-09: 10 mg via ORAL
  Filled 2018-10-08: qty 1

## 2018-10-08 MED ORDER — DEXTROSE-NACL 5-0.9 % IV SOLN
INTRAVENOUS | Status: DC
  Administered 2018-10-08 – 2018-10-09 (×2): via INTRAVENOUS

## 2018-10-08 MED ORDER — ONDANSETRON HCL 4 MG PO TABS: 4.0000 mg | ORAL_TABLET | Freq: Four times a day (QID) | ORAL | Status: DC | PRN

## 2018-10-08 MED ORDER — SODIUM CHLORIDE 0.9 % IV BOLUS
1000.0000 mL | Freq: Once | INTRAVENOUS | Status: AC
  Administered 2018-10-08: 1000 mL via INTRAVENOUS

## 2018-10-08 MED ORDER — CHOLESTYRAMINE 4 G PO PACK
4.0000 g | PACK | Freq: Two times a day (BID) | ORAL | Status: DC
  Administered 2018-10-09 – 2018-10-10 (×2): 4 g via ORAL
  Filled 2018-10-08 (×4): qty 1

## 2018-10-08 MED ORDER — MAGNESIUM OXIDE 400 (241.3 MG) MG PO TABS
400.0000 mg | ORAL_TABLET | Freq: Every day | ORAL | Status: DC
  Administered 2018-10-09 – 2018-10-10 (×2): 400 mg via ORAL
  Filled 2018-10-08 (×2): qty 1

## 2018-10-08 MED ORDER — ALBUTEROL SULFATE (2.5 MG/3ML) 0.083% IN NEBU: 2.5000 mg | INHALATION_SOLUTION | Freq: Four times a day (QID) | RESPIRATORY_TRACT | Status: DC | PRN

## 2018-10-08 MED ORDER — ADULT MULTIVITAMIN W/MINERALS CH
1.0000 | ORAL_TABLET | Freq: Every day | ORAL | Status: DC
  Filled 2018-10-08: qty 1

## 2018-10-08 MED ORDER — CITALOPRAM HYDROBROMIDE 10 MG PO TABS
20.0000 mg | ORAL_TABLET | Freq: Every day | ORAL | Status: DC
  Administered 2018-10-09 – 2018-10-10 (×2): 20 mg via ORAL
  Filled 2018-10-08 (×2): qty 2

## 2018-10-08 MED ORDER — ANASTROZOLE 1 MG PO TABS
1.0000 mg | ORAL_TABLET | Freq: Every day | ORAL | Status: DC
  Administered 2018-10-09: 1 mg via ORAL
  Filled 2018-10-08: qty 1

## 2018-10-08 MED ORDER — ARFORMOTEROL TARTRATE 15 MCG/2ML IN NEBU: 15.0000 ug | INHALATION_SOLUTION | Freq: Three times a day (TID) | RESPIRATORY_TRACT | Status: DC | PRN

## 2018-10-08 MED ORDER — VITAMIN D 25 MCG (1000 UNIT) PO TABS
2000.0000 [IU] | ORAL_TABLET | Freq: Every day | ORAL | Status: DC
  Filled 2018-10-08: qty 2

## 2018-10-08 MED ORDER — SENNA 8.6 MG PO TABS: 1.0000 | ORAL_TABLET | Freq: Every day | ORAL | Status: DC | PRN

## 2018-10-08 MED ORDER — DILTIAZEM HCL ER COATED BEADS 120 MG PO CP24
120.0000 mg | ORAL_CAPSULE | Freq: Every day | ORAL | Status: DC
  Administered 2018-10-09 – 2018-10-10 (×2): 120 mg via ORAL
  Filled 2018-10-08 (×2): qty 1

## 2018-10-08 MED ORDER — ALBUTEROL SULFATE HFA 108 (90 BASE) MCG/ACT IN AERS: 2.0000 | INHALATION_SPRAY | Freq: Four times a day (QID) | RESPIRATORY_TRACT | Status: DC | PRN

## 2018-10-08 MED ORDER — OMEGA-3-ACID ETHYL ESTERS 1 G PO CAPS
1.0000 g | ORAL_CAPSULE | Freq: Every day | ORAL | Status: DC
  Filled 2018-10-08: qty 1

## 2018-10-08 MED ORDER — OLANZAPINE 2.5 MG PO TABS
2.5000 mg | ORAL_TABLET | Freq: Every day | ORAL | Status: DC
  Administered 2018-10-09 – 2018-10-10 (×2): 2.5 mg via ORAL
  Filled 2018-10-08 (×2): qty 1

## 2018-10-08 MED ORDER — ONDANSETRON HCL 4 MG/2ML IJ SOLN: 4.0000 mg | Freq: Four times a day (QID) | INTRAMUSCULAR | Status: DC | PRN

## 2018-10-08 MED ORDER — ATORVASTATIN CALCIUM 40 MG PO TABS
40.0000 mg | ORAL_TABLET | Freq: Every day | ORAL | Status: DC
  Administered 2018-10-09: 40 mg via ORAL
  Filled 2018-10-08: qty 1

## 2018-10-08 MED ORDER — RISAQUAD PO CAPS
1.0000 | ORAL_CAPSULE | Freq: Every day | ORAL | Status: DC
  Administered 2018-10-09 – 2018-10-10 (×2): 1 via ORAL
  Filled 2018-10-08 (×2): qty 1

## 2018-10-08 NOTE — ED Triage Notes (Signed)
Pt here from home with c/o weakness , cbg 134 , pt was recently hospital for a fall last month , pt alert and oriented but sleepy

## 2018-10-08 NOTE — Progress Notes (Addendum)
Subjective:    Patient ID: Summer Williams, female    DOB: 03-20-1946, 73 y.o.   MRN: [id number]  HPI 73 year old Female seen today by interactive audio and video telecommunications due to the coronavirus pandemic.  This is a hospitalization follow-up.  Patient agrees to visit in this format today.  She is identified by 2 identifiers as Summer Williams, a patient in this practice.   Patient was admitted to the hospital April 26 due to a fall at home resulting in bilateral subdural hematomas with right periorbital hematoma.  Due to low platelet count patient received 1 unit of platelets.  Neurosurgery recommended the patient be followed and not undergo surgery to drain the hematomas.  She had comminuted fracture of the humerus which had to be surgically repaired open reduction and internal fixation.  Patient is followed by Dr. Lindi Adie for history of breast and lung cancer.  Patient currently is not undergoing treatment for these cancers but having regular radiology monitoring.  Patient is seen today in bed with a right infraorbital hematoma.  She appears to be a lot.  She seems to recognize me.  Her son and daughter-in-law are there today as well as an occupational therapist and an aide.  They are concerned because she has had very little p.o. intake.  They think she may have had 16 ounces in the past 24 hours in her urine is dark yellow.  They are concerned about a possible urinary infection.  A home health nurse will be coming out today and we will try to get some lab work.  I am concerned she may need to return to the hospital for volume depletion.  Patient indicates she does have a DNR.  I questioned her directly about whether she wanted to go back to the hospital for further treatment and she indicated she did not at this point in time.  She was transfused on May 5 prior to her discharge.  On May 3 her hemoglobin was 9.3 g and her platelet count was 60,000.  Her white blood cell  count at the time was 13,800.  Family indicated they were reluctant to seek rehab admission for her due to the COVID-19 pandemic and thought she would do better at home.  However they are finding it difficult to take care of her.  They have been with her constantly.  Family believes she is running a low-grade fever of approximately 99 to 100 degrees.   Longstanding history of thrombocytopenia.  The best  platelet count over the past few years was June 2016 when it was 112,000.  Since that time platelet counts have been less than 100,000.  On admission her platelet count was 38,000, hemoglobin 9.7 g and white blood cell count 9800.  Review of Systems see above     Objective:   Physical Exam  See above regarding temperature.  She is lucid and speaks appropriately to me today.  Seen lying in bed.      Assessment & Plan:  I am concerned she is developing an infection perhaps pneumonia or urinary tract infection.  I am also concerned she has volume depletion  Plan: Home health agency indicates I will be sending a nurse out to collect blood and to further assist the patient this afternoon.  I have asked son to call me back at 4 PM today for further discussion regarding medical management.  He agrees to do this.  Addendum: I received a phone call around 2:30 PM that  ambulance had been called because patient showed a decline in mental status and that she would be taken back to the hospital.  I spoke with home health nurse who had arrived about her condition.  Home health nurse indicates patient is not lucid at this point in time.  I have asked the home health nurse to discard the blood she just removed and asked that patient be transported urgently to the hospital for evaluation.  40 minutes spent with this hospital follow-up visit and an additional phone call regarding medical management

## 2018-10-08 NOTE — H&P (Signed)
History and Physical   Summer Williams GGY:[id number] DOB: Oct 30, 1945 DOA: 10/08/2018  Referring MD/NP/PA: Dr. Tyrone Nine  PCP: Elby Showers, MD   Outpatient Specialists: None  Patient coming from: Home  Chief Complaint: Weakness  HPI: Summer Williams is a 73 y.o. female with medical history significant of recent fall with intracranial hemorrhage subdural hematoma with right humeral and ulnar fractures,Depression, hyperlipidemia, COPD, history of breast cancer, lung cancer with mets and chronic hypoxia who was discharged from the hospital on 10/03/2018 to follow-up with orthopedics and oncologist.  Patient was seen by her PCP twice in 2 days due to weakness as well as shortness of breath.  Patient has not been doing well with home health at home.  PCP suspected some kind of infection and sent her over to the ER.  In the ER she is continued to look weak acutely ill again.  Currently on 3 L of oxygen.  Patient not doing much better better.  Head CT without contrast showed possible new subarachnoid hemorrhage.  Neurosurgery consulted and suggested admitting the patient and reevaluation for possible ongoing intracranial hemorrhage.  Patient has no falls since discharge.  Not on any anticoagulation.  She is however a poor historian not able to give me any history..  ED Course: Temperature is 99.7 blood pressure 130/71 pulse 110 rate of 31 and sat 95% on room air.  White count is 17.2 hemoglobin 11.9 and platelets 66.  Chloride is 95 CO2 34.  Glucose is 128.  ABG showed a pH of 7.54 being venous.  Chest x-ray showed no change from previous.  Head CT without contrast showed increased size of right-sided subdural hematoma with increased mass-effect including 12 mm of leftward midline shift.  There is a new small volume acute subarachnoid hemorrhage with persistent small left-sided subdural hematoma.  Neurosurgery consulted and patient is being admitted to the hospital for management..  Review of Systems: As  per HPI otherwise 10 point review of systems negative.    Past Medical History:  Diagnosis Date  . Breast cancer ([place]) 01/05/14   left breast  . COPD (chronic obstructive pulmonary disease) ([place])   . Depression   . Diverticulosis   . History of alcohol abuse    last 1980  . Hyperlipidemia   . Internal hemorrhoids   . Lung cancer ([place])   . Oxygen deficiency   . Personal history of radiation therapy 2015  . Radiation 03/23/14-04/19/14   Left breast  . Radiation 12/13/14-01/19/15   right upper/middle lobe of lung 70.2 Gy  . Requires supplemental oxygen    at night USES 2 LITERS   . Shortness of breath     WITH ACTIVITY  . Skin cancer    Squamous  . Vitamin D deficiency   . Wears glasses     Past Surgical History:  Procedure Laterality Date  . BREAST BIOPSY Left 01/15/2014   x2  . BREAST LUMPECTOMY Left 2015   radiation  . BREAST SURGERY  02/09/2014   Left luimpectomy  . CHOLECYSTECTOMY  2003  . COLONOSCOPY WITH PROPOFOL N/A 11/08/2014   Procedure: COLONOSCOPY WITH PROPOFOL;  Surgeon: Inda Castle, MD;  Location: [facility];  Service: Endoscopy;  Laterality: N/A;  . DENTAL SURGERY     implants  . EYE SURGERY Bilateral 2005   Cataract OD  . ORIF HUMERUS FRACTURE Right 09/30/2018   Procedure: OPEN REDUCTION INTERNAL FIXATION (ORIF) DISTAL HUMERUS FRACTURE;  Surgeon: Renette Butters, MD;  Location: [facility];  Service: Orthopedics;  Laterality: Right;  . ORIF WRIST FRACTURE Right 09/30/2018   Procedure: OPEN REDUCTION INTERNAL FIXATION (ORIF) WRIST FRACTURE;  Surgeon: Renette Butters, MD;  Location: [facility];  Service: Orthopedics;  Laterality: Right;  Marland Kitchen VIDEO BRONCHOSCOPY WITH ENDOBRONCHIAL NAVIGATION N/A 11/10/2014   Procedure: VIDEO BRONCHOSCOPY WITH ENDOBRONCHIAL NAVIGATION;  Surgeon: Melrose Nakayama, MD;  Location: [facility];  Service: Thoracic;  Laterality: N/A;     reports that she quit smoking about 13 years ago. Her smoking use included cigarettes. She has a 50.00  pack-year smoking history. She has never used smokeless tobacco. She reports that she does not drink alcohol or use drugs.  No Known Allergies  Family History  Problem Relation Age of Onset  . Heart disease Father   . Lung cancer Maternal Grandfather   . Leukemia Paternal Grandfather   . Cholelithiasis Mother   . Ovarian cancer Maternal Aunt   . Breast cancer Maternal Aunt   . Colon cancer Neg Hx   . Colon polyps Neg Hx   . Esophageal cancer Neg Hx   . Kidney disease Neg Hx   . Gallbladder disease Neg Hx   . Stomach cancer Neg Hx   . Rectal cancer Neg Hx      Prior to Admission medications   Medication Sig Start Date End Date Taking? Authorizing Provider  albuterol (PROVENTIL) (2.5 MG/3ML) 0.083% nebulizer solution Take 3 mLs (2.5 mg total) by nebulization every 6 (six) hours as needed for wheezing or shortness of breath. 05/09/16   Baird Lyons D, MD  anastrozole (ARIMIDEX) 1 MG tablet TAKE 1 TABLET BY MOUTH EVERY DAY 09/06/18   Nicholas Lose, MD  arformoterol (BROVANA) 15 MCG/2ML NEBU 1 neb every 8 hours if needed 06/25/17   Baird Lyons D, MD  aspirin 81 MG tablet Take 1 tablet (81 mg total) by mouth daily. 04/28/16   Nicholas Lose, MD  atorvastatin (LIPITOR) 40 MG tablet TAKE 1 TABLET BY MOUTH EVERY DAY 05/07/18   Elby Showers, MD  Cholecalciferol (VITAMIN D) 2000 UNITS CAPS Take 1 capsule by mouth daily.     [provider]  cholestyramine (QUESTRAN) 4 g packet TAKE 1 PACKET (4 G TOTAL) BY MOUTH 2 (TWO) TIMES DAILY. 01/05/18   Armbruster, Carlota Raspberry, MD  citalopram (CELEXA) 20 MG tablet Take 20 mg by mouth daily.  09/14/16   [provider]  diltiazem (CARDIZEM CD) 120 MG 24 hr capsule Take 1 capsule (120 mg total) by mouth daily. 10/04/18   Domenic Polite, MD  loratadine (CLARITIN) 10 MG tablet Take 10 mg by mouth daily.    [provider]  magnesium oxide (MAG-OX) 400 MG tablet Take 400 mg by mouth daily.    [provider]  Multiple  Vitamins-Minerals (MULTIVITAMIN GUMMIES ADULT PO) Take 2 tablets by mouth daily.     [provider]  OLANZapine (ZYPREXA) 2.5 MG tablet Take 1 tablet (2.5 mg total) by mouth daily. 10/03/18   Domenic Polite, MD  Omega-3 Fatty Acids (FISH OIL) 1200 MG CAPS Take 1 capsule by mouth daily.    [provider]  oxyCODONE-acetaminophen (PERCOCET/ROXICET) 5-325 MG tablet Take 1 tablet by mouth every 6 (six) hours as needed for severe pain. Patient taking differently: Take 1 tablet by mouth every 6 (six) hours as needed for severe pain. Taking 1 tablet daily 08/02/18   Nicholas Lose, MD  Probiotic Product (PROBIOTIC DAILY PO) Take 2 tablets by mouth daily. Probiotic gummy    [provider]  senna (SENOKOT) 8.6 MG TABS tablet Take 1 tablet (8.6 mg total) by mouth daily as needed for mild constipation. 10/03/18   Domenic Polite, MD  umeclidinium-vilanterol [facility] ELLIPTA) 62.5-25 MCG/INH AEPB Inhale 1 puff into the lungs daily. Patient not taking: Reported on 09/28/2018 03/08/18   Baird Lyons D, MD    Physical Exam: Vitals:   10/08/18 1800 10/08/18 1815 10/08/18 1845 10/08/18 1900  BP: (!) 121/59 (!) 130/59 117/67 129/70  Pulse: 100 89 98 (!) 101  Resp: (!) 22 (!) 25 (!) 29 (!) 31  Temp:      TempSrc:      SpO2: 98% 98% 96% 100%  Weight:      Height:          Constitutional: Chronically ill looking, drowsy Vitals:   10/08/18 1800 10/08/18 1815 10/08/18 1845 10/08/18 1900  BP: (!) 121/59 (!) 130/59 117/67 129/70  Pulse: 100 89 98 (!) 101  Resp: (!) 22 (!) 25 (!) 29 (!) 31  Temp:      TempSrc:      SpO2: 98% 98% 96% 100%  Weight:      Height:       Eyes: PERRL, right upper lid laceration, and conjunctivae normal ENMT: Mucous membranes are moist. Posterior pharynx clear of any exudate or lesions.Normal dentition.  Neck: normal, supple, no masses, no thyromegaly Respiratory: clear to auscultation bilaterally, no wheezing, no crackles. Normal respiratory effort. No  accessory muscle use.  Cardiovascular: Sinus tachycardia, no murmurs / rubs / gallops. No extremity edema. 2+ pedal pulses. No carotid bruits.  Abdomen: no tenderness, no masses palpated. No hepatosplenomegaly. Bowel sounds positive.  Musculoskeletal: Multiple fractures right upper extremity, immobilized, normal muscle tone.  Skin: Multiple rashes, lesions, ulcers. No induration Neurologic: CN 2-12 grossly intact. Sensation intact, DTR normal. Strength 5/5 in all 4.  Psychiatric: Drowsy, responding to questions but looks weak, a Normal mood.     Labs on Admission: I have personally reviewed following labs and imaging studies  CBC: Recent Labs  Lab 10/03/18 0411 10/08/18 1537 10/08/18 1606  WBC 13.8* 17.2*  --   NEUTROABS  --  13.1*  --   HGB 9.3* 11.6* 11.9*  HCT 29.1* 36.4 35.0*  MCV 89.3 90.3  --   PLT 60* 66*  --    Basic Metabolic Panel: Recent Labs  Lab 10/03/18 0411 10/08/18 1537 10/08/18 1606  NA 139 140 136  K 3.1* 3.7 3.7  CL 96* 95*  --   CO2 30 34*  --   GLUCOSE 119* 128*  --   BUN 9 9  --   CREATININE 0.44 0.53 0.50  CALCIUM 8.9 9.2  --    GFR: Estimated Creatinine Clearance: 62.3 mL/min (by C-G formula based on SCr of 0.5 mg/dL). Liver Function Tests: Recent Labs  Lab 10/08/18 1537  AST 18  ALT 13  ALKPHOS 87  BILITOT 1.3*  PROT 7.2  ALBUMIN 3.4*   No results for input(s): LIPASE, AMYLASE in the last 168 hours. Recent Labs  Lab 10/08/18 1537  AMMONIA 46*   Coagulation Profile: No results for input(s): INR, PROTIME in the last 168 hours. Cardiac Enzymes: No results for input(s): CKTOTAL, CKMB, CKMBINDEX, TROPONINI in the last 168 hours. BNP (last 3 results) No results for input(s): PROBNP in the last 8760 hours. HbA1C: No results for input(s): HGBA1C in the last 72 hours. CBG: Recent Labs  Lab 10/08/18 1554  GLUCAP 125*   Lipid Profile: No results for  input(s): CHOL, HDL, LDLCALC, TRIG, CHOLHDL, LDLDIRECT in the last 72 hours.  Thyroid Function Tests: No results for input(s): TSH, T4TOTAL, FREET4, T3FREE, THYROIDAB in the last 72 hours. Anemia Panel: No results for input(s): VITAMINB12, FOLATE, FERRITIN, TIBC, IRON, RETICCTPCT in the last 72 hours. Urine analysis:    Component Value Date/Time   BILIRUBINUR NEG 04/27/2018 1016   PROTEINUR Positive (A) 04/27/2018 1016   UROBILINOGEN 0.2 04/27/2018 1016   NITRITE NEG 04/27/2018 1016   LEUKOCYTESUR Negative 04/27/2018 1016   Sepsis Labs: @LABRCNTIP (procalcitonin:4,lacticidven:4) ) Recent Results (from the past 240 hour(s))  SARS Coronavirus 2 (CEPHEID - Performed in [place] hospital lab), Hosp Order     Status: None   Collection Time: 10/08/18  3:55 PM  Result Value Ref Range Status   SARS Coronavirus 2 NEGATIVE NEGATIVE Final    Comment: (NOTE) If result is NEGATIVE SARS-CoV-2 target nucleic acids are NOT DETECTED. The SARS-CoV-2 RNA is generally detectable in upper and lower  respiratory specimens during the acute phase of infection. The lowest  concentration of SARS-CoV-2 viral copies this assay can detect is 250  copies / mL. A negative result does not preclude SARS-CoV-2 infection  and should not be used as the sole basis for treatment or other  patient management decisions.  A negative result may occur with  improper specimen collection / handling, submission of specimen other  than nasopharyngeal swab, presence of viral mutation(s) within the  areas targeted by this assay, and inadequate number of viral copies  (<250 copies / mL). A negative result must be combined with clinical  observations, patient history, and epidemiological information. If result is POSITIVE SARS-CoV-2 target nucleic acids are DETECTED. The SARS-CoV-2 RNA is generally detectable in upper and lower  respiratory specimens dur ing the acute phase of infection.  Positive  results are indicative of active infection with SARS-CoV-2.  Clinical  correlation with patient  history and other diagnostic information is  necessary to determine patient infection status.  Positive results do  not rule out bacterial infection or co-infection with other viruses. If result is PRESUMPTIVE POSTIVE SARS-CoV-2 nucleic acids MAY BE PRESENT.   A presumptive positive result was obtained on the submitted specimen  and confirmed on repeat testing.  While 2019 novel coronavirus  (SARS-CoV-2) nucleic acids may be present in the submitted sample  additional confirmatory testing may be necessary for epidemiological  and / or clinical management purposes  to differentiate between  SARS-CoV-2 and other Sarbecovirus currently known to infect humans.  If clinically indicated additional testing with an alternate test  methodology [phone]) is advised. The SARS-CoV-2 RNA is generally  detectable in upper and lower respiratory sp ecimens during the acute  phase of infection. The expected result is Negative. Fact Sheet for Patients:  StrictlyIdeas.no Fact Sheet for Healthcare Providers: BankingDealers.co.za This test is not yet approved or cleared by the [place] FDA and has been authorized for detection and/or diagnosis of SARS-CoV-2 by FDA under an Emergency Use Authorization (EUA).  This EUA will remain in effect (meaning this test can be used) for the duration of the COVID-19 declaration under Section 564(b)(1) of the Act, 21 U.S.C. section 360bbb-3(b)(1), unless the authorization is terminated or revoked sooner. Performed at [facility], [place] [address] [place], [place] [address]      Radiological Exams on Admission: Ct Head Wo Contrast  Result Date: 10/08/2018 CLINICAL DATA:  Weakness. Altered level of consciousness. EXAM: CT HEAD WITHOUT CONTRAST TECHNIQUE: Contiguous axial images were  obtained from the base of the skull through the vertex without intravenous contrast. COMPARISON:  09/26/2018 FINDINGS: Brain:  Subdural hematoma over the right cerebral convexity has enlarged and now measures up to 16 mm in thickness in the frontal region, however the dominant portion of the hematoma is now hypoattenuating with a small amount of more hyperattenuating blood products posteriorly. There is new small volume acute subarachnoid hemorrhage primarily in right parieto-occipital sulci. A small residual left-sided subdural hematoma is lower in density than that on the prior study and measures up to 5 mm in thickness in the parietal region. Intracranial mass effect has increased with partial effacement of the right greater than left lateral ventricles and 12 mm of leftward midline shift (previously 1-2 mm). No acute large territory infarct is identified. Cerebral white matter hypodensities are similar to the prior study and nonspecific but compatible with mild chronic small vessel ischemic disease. Vascular: Calcified atherosclerosis at the skull base. Skull: No fracture or focal osseous lesion. Sinuses/Orbits: Visualized paranasal sinuses and mastoid air cells are clear. Bilateral cataract extraction is noted. Other: Small residual right periorbital hematoma, decreased from prior. IMPRESSION: 1. Increased size of right-sided subdural hematoma with increased mass effect including 12 mm of leftward midline shift. 2. New small volume acute subarachnoid hemorrhage. 3. Persistent small left-sided subdural hematoma. Critical Value/emergent results were called by telephone at the time of interpretation on 10/08/2018 at 5:30 pm to Dr. Deno Etienne , who verbally acknowledged these results. Electronically Signed   By: Logan Bores M.D.   On: 10/08/2018 17:32   Dg Chest Port 1 View  Result Date: 10/08/2018 CLINICAL DATA:  Shortness of breath. History of lung cancer and breast cancer. EXAM: PORTABLE CHEST 1 VIEW COMPARISON:  09/26/2018 FINDINGS: The cardiomediastinal silhouette is unchanged with normal heart size. Volume loss is again seen in the  right hemithorax. Density extending from the right hilum to the lateral right midlung with regional architectural distortion is unchanged. No new airspace opacity, edema, pleural effusion, or pneumothorax is identified. Surgical clips project over the left breast. Proximal right humerus and right seventh rib fractures are again noted. IMPRESSION: Unchanged appearance of the chest including chronic post treatment changes in the right lung. No evidence of acute airspace disease. Electronically Signed   By: Logan Bores M.D.   On: 10/08/2018 16:32    EKG: Independently reviewed. Sinus tachycardia with a rate of 109.  Borderline low voltage.  No significant ST changes  Assessment/Plan Principal Problem:   SAH (subarachnoid hemorrhage) (HCC) Active Problems:   Hyperlipidemia   Schizoaffective disorder (HCC)   COPD exacerbation ([facility])   Cancer of lower-inner quadrant of left female breast ([place])   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 ([place])   DNR (do not resuscitate)   Chronic respiratory failure with hypoxia (HCC)   Subdural hematoma (HCC)   Closed displaced fracture of greater tuberosity of right humerus     #1 worsening subdural hematoma: Patient will be admitted for reevaluation in the morning.  Neurosurgery to follow.  Monitor neurologic symptoms.  Repeat scanning in the morning.  #2 new subarachnoid hemorrhage: Continue as above.  Follow recommendation by neurosurgery.  #3 multiple humeral fractures and ulna fracture: Right arm is immobilized.  Continue per orthopedics.  Recently seen and treated by orthopedics.  #4 chronic hypoxemia: Continue oxygen from home.  Other medications from home regimen.  #5 schizoaffective disorder: Patient is at baseline.  #6 history of breast and lung cancers: Patient to continue outpatient  follow-up with oncology   DVT prophylaxis: SCD Code Status: DNR Family Communication: No family at bedside Disposition Plan: To be determined most likely  skilled facility Consults called: Neurosurgery called by ER Admission status: Inpatient to progressive  Severity of Illness: The appropriate patient status for this patient is INPATIENT. Inpatient status is judged to be reasonable and necessary in order to provide the required intensity of service to ensure the patient's safety. The patient's presenting symptoms, physical exam findings, and initial radiographic and laboratory data in the context of their chronic comorbidities is felt to place them at high risk for further clinical deterioration. Furthermore, it is not anticipated that the patient will be medically stable for discharge from the hospital within 2 midnights of admission. The following factors support the patient status of inpatient.   " The patient's presenting symptoms include weakness. " The worrisome physical exam findings include decreased mentation. " The initial radiographic and laboratory data are worrisome because of CT head showing worsening intracranial bleed. " The chronic co-morbidities include recent fall with intracranial bleeds.   * I certify that at the point of admission it is my clinical judgment that the patient will require inpatient hospital care spanning beyond 2 midnights from the point of admission due to high intensity of service, high risk for further deterioration and high frequency of surveillance required.Barbette Merino MD [facility] Pager [phone]  If 7PM-7AM, please contact night-coverage [email or web address] Password [facility]  10/08/2018, 7:42 PM

## 2018-10-08 NOTE — Patient Instructions (Signed)
Patient will be taken via ambulance to hospital for further evaluation given sudden decline in mental status.

## 2018-10-08 NOTE — Progress Notes (Addendum)
[facility] ED TOC CM -referral SNF Placement  Received call from [place], per [facility] Medical Director, Dr Amalia Hailey, pt is approved for SNF rehab. Jonnie Finner RN CCM Case Mgmt phone [phone]

## 2018-10-08 NOTE — ED Provider Notes (Signed)
[facility] EMERGENCY DEPARTMENT Provider Note   CSN: [id number] Arrival date & time: 10/08/18  1521    History   Chief Complaint Chief Complaint  Patient presents with   Weakness    HPI Summer Williams is a 73 y.o. female.     73 yo F with a cc of weakness.  Patient states is been going on for a few days.  Patient was just in the hospital and admitted for a fall and had bilateral subdural hematomas and multiple fractures to the right arm.  Since then she is  mildly more short of breath.  She denies fevers.  Denies cough or congestion.  Denies chest pain denies abdominal pain vomiting or diarrhea.  Has had some mild headaches.  The history is provided by the patient.  Weakness  Severity:  Moderate Onset quality:  Sudden Duration:  5 days Timing:  Constant Progression:  Worsening Chronicity:  New Relieved by:  Nothing Worsened by:  Nothing Ineffective treatments:  None tried Associated symptoms: headaches and shortness of breath   Associated symptoms: no arthralgias, no chest pain, no dizziness, no dysuria, no fever, no myalgias, no nausea, no urgency and no vomiting     Past Medical History:  Diagnosis Date   Breast cancer ([place]) 01/05/14   left breast   COPD (chronic obstructive pulmonary disease) ([place])    Depression    Diverticulosis    History of alcohol abuse    last 1980   Hyperlipidemia    Internal hemorrhoids    Lung cancer ([place])    Oxygen deficiency    Personal history of radiation therapy 2015   Radiation 03/23/14-04/19/14   Left breast   Radiation 12/13/14-01/19/15   right upper/middle lobe of lung 70.2 Gy   Requires supplemental oxygen    at night USES 2 LITERS    Shortness of breath     WITH ACTIVITY   Skin cancer    Squamous   Vitamin D deficiency    Wears glasses     Patient Active Problem List   Diagnosis Date Noted   SAH (subarachnoid hemorrhage) ([place]) 10/08/2018   Subdural hematoma ([place]) 09/27/2018     Closed displaced fracture of greater tuberosity of right humerus    Closed nondisplaced fracture of styloid process of right ulna    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   Chronic respiratory failure with hypoxia ([place]) 05/05/2017   Bone metastasis ([place]) 03/20/2016   DNR (do not resuscitate) 03/10/2016   Pain from bone metastases ([place]) 03/10/2016   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 ([place]) 11/21/2014   Nonspecific abnormal finding in stool contents 11/08/2014   Diverticulosis of colon without hemorrhage 11/08/2014   Internal hemorrhoids 11/08/2014   Abnormal CT scan, chest 11/06/2014   Encounter for screening colonoscopy 08/22/2014   Colon cancer screening 08/22/2014   Diarrhea 03/01/2014   Cancer of lower-inner quadrant of left female breast ([place]) 01/09/2014   Dyspnea 08/30/2012   Elevated liver enzymes 05/03/2011   Hyperlipidemia 04/11/2011   Schizoaffective disorder ([place]) 04/11/2011   COPD exacerbation ([place]) 04/11/2011   Vitamin D deficiency 04/11/2011    Past Surgical History:  Procedure Laterality Date   BREAST BIOPSY Left 01/15/2014   x2   BREAST LUMPECTOMY Left 2015   radiation   BREAST SURGERY  02/09/2014   Left luimpectomy   CHOLECYSTECTOMY  2003   COLONOSCOPY WITH PROPOFOL N/A 11/08/2014   Procedure: COLONOSCOPY WITH PROPOFOL;  Surgeon: Inda Castle, MD;  Location: [facility];  Service: Endoscopy;  Laterality: N/A;   DENTAL SURGERY     implants   EYE SURGERY Bilateral 2005   Cataract OD   ORIF HUMERUS FRACTURE Right 09/30/2018   Procedure: OPEN REDUCTION INTERNAL FIXATION (ORIF) DISTAL HUMERUS FRACTURE;  Surgeon: Renette Butters, MD;  Location: [place];  Service: Orthopedics;  Laterality: Right;   ORIF WRIST FRACTURE Right 09/30/2018   Procedure: OPEN REDUCTION INTERNAL FIXATION (ORIF) WRIST FRACTURE;  Surgeon: Renette Butters, MD;  Location: [place];  Service: Orthopedics;  Laterality: Right;   VIDEO  BRONCHOSCOPY WITH ENDOBRONCHIAL NAVIGATION N/A 11/10/2014   Procedure: VIDEO BRONCHOSCOPY WITH ENDOBRONCHIAL NAVIGATION;  Surgeon: Melrose Nakayama, MD;  Location: [place];  Service: Thoracic;  Laterality: N/A;     OB History   No obstetric history on file.    Obstetric Comments  Menarche age 10 First live birth age 62, 34 son No birth control pills No hormonal replacement therapy         Home Medications    Prior to Admission medications   Medication Sig Start Date End Date Taking? Authorizing Provider  albuterol (PROVENTIL) (2.5 MG/3ML) 0.083% nebulizer solution Take 3 mLs (2.5 mg total) by nebulization every 6 (six) hours as needed for wheezing or shortness of breath. 05/09/16   Baird Lyons D, MD  anastrozole (ARIMIDEX) 1 MG tablet TAKE 1 TABLET BY MOUTH EVERY DAY 09/06/18   Nicholas Lose, MD  arformoterol (BROVANA) 15 MCG/2ML NEBU 1 neb every 8 hours if needed 06/25/17   Baird Lyons D, MD  aspirin 81 MG tablet Take 1 tablet (81 mg total) by mouth daily. 04/28/16   Nicholas Lose, MD  atorvastatin (LIPITOR) 40 MG tablet TAKE 1 TABLET BY MOUTH EVERY DAY 05/07/18   Elby Showers, MD  Cholecalciferol (VITAMIN D) 2000 UNITS CAPS Take 1 capsule by mouth daily.     [provider]  cholestyramine (QUESTRAN) 4 g packet TAKE 1 PACKET (4 G TOTAL) BY MOUTH 2 (TWO) TIMES DAILY. 01/05/18   Armbruster, Carlota Raspberry, MD  citalopram (CELEXA) 20 MG tablet Take 20 mg by mouth daily.  09/14/16   [provider]  diltiazem (CARDIZEM CD) 120 MG 24 hr capsule Take 1 capsule (120 mg total) by mouth daily. 10/04/18   Domenic Polite, MD  loratadine (CLARITIN) 10 MG tablet Take 10 mg by mouth daily.    [provider]  magnesium oxide (MAG-OX) 400 MG tablet Take 400 mg by mouth daily.    [provider]  Multiple Vitamins-Minerals (MULTIVITAMIN GUMMIES ADULT PO) Take 2 tablets by mouth daily.     [provider]  OLANZapine (ZYPREXA) 2.5 MG tablet Take 1 tablet (2.5 mg  total) by mouth daily. 10/03/18   Domenic Polite, MD  Omega-3 Fatty Acids (FISH OIL) 1200 MG CAPS Take 1 capsule by mouth daily.    [provider]  oxyCODONE-acetaminophen (PERCOCET/ROXICET) 5-325 MG tablet Take 1 tablet by mouth every 6 (six) hours as needed for severe pain. Patient taking differently: Take 1 tablet by mouth every 6 (six) hours as needed for severe pain. Taking 1 tablet daily 08/02/18   Nicholas Lose, MD  Probiotic Product (PROBIOTIC DAILY PO) Take 2 tablets by mouth daily. Probiotic gummy    [provider]  senna (SENOKOT) 8.6 MG TABS tablet Take 1 tablet (8.6 mg total) by mouth daily as needed for mild constipation. 10/03/18   Domenic Polite, MD  umeclidinium-vilanterol [facility] ELLIPTA) 62.5-25 MCG/INH AEPB Inhale 1 puff  into the lungs daily. Patient not taking: Reported on 09/28/2018 03/08/18   Deneise Lever, MD    Family History Family History  Problem Relation Age of Onset   Heart disease Father    Lung cancer Maternal Grandfather    Leukemia Paternal Grandfather    Cholelithiasis Mother    Ovarian cancer Maternal Aunt    Breast cancer Maternal Aunt    Colon cancer Neg Hx    Colon polyps Neg Hx    Esophageal cancer Neg Hx    Kidney disease Neg Hx    Gallbladder disease Neg Hx    Stomach cancer Neg Hx    Rectal cancer Neg Hx     Social History Social History   Tobacco Use   Smoking status: Former Smoker    Packs/day: 1.00    Years: 50.00    Pack years: 50.00    Types: Cigarettes    Last attempt to quit: 08/31/2005    Years since quitting: 13.1   Smokeless tobacco: Never Used  Substance Use Topics   Alcohol use: No    Comment: hx abuse-last 1980   Drug use: No     Allergies   Patient has no known allergies.   Review of Systems Review of Systems  Constitutional: Negative for chills and fever.  HENT: Negative for congestion and rhinorrhea.   Eyes: Negative for redness and visual disturbance.  Respiratory:  Positive for shortness of breath. Negative for wheezing.   Cardiovascular: Negative for chest pain and palpitations.  Gastrointestinal: Negative for nausea and vomiting.  Genitourinary: Negative for dysuria and urgency.  Musculoskeletal: Negative for arthralgias and myalgias.  Skin: Negative for pallor and wound.  Neurological: Positive for weakness and headaches. Negative for dizziness.     Physical Exam Updated Vital Signs BP (!) 130/59    Pulse 89    Temp 99.7 F (37.6 C) (Rectal)    Resp (!) 25    Ht 5' 4.5" (1.638 m)    Wt 76.2 kg    SpO2 98%    BMI 28.39 kg/m   Physical Exam Vitals signs and nursing note reviewed.  Constitutional:      General: She is not in acute distress.    Appearance: She is well-developed. She is not diaphoretic.  HENT:     Head: Normocephalic.     Comments: Diffuse bruising around the eyes and under the neck Eyes:     Pupils: Pupils are equal, round, and reactive to light.  Neck:     Musculoskeletal: Normal range of motion and neck supple.  Cardiovascular:     Rate and Rhythm: Regular rhythm. Tachycardia present.     Heart sounds: No murmur. No friction rub. No gallop.   Pulmonary:     Effort: Pulmonary effort is normal.     Breath sounds: No wheezing or rales.     Comments: Tachypnea Abdominal:     General: There is no distension.     Palpations: Abdomen is soft.     Tenderness: There is no abdominal tenderness.  Musculoskeletal:        General: No tenderness.     Comments: Splint to the right upper extremity  Skin:    General: Skin is warm and dry.  Neurological:     Mental Status: She is alert and oriented to person, place, and time.  Psychiatric:        Behavior: Behavior normal.      ED Treatments / Results  Labs (all labs ordered  are listed, but only abnormal results are displayed) Labs Reviewed  AMMONIA - Abnormal; Notable for the following components:      Result Value   Ammonia 46 (*)    All other components within  normal limits  COMPREHENSIVE METABOLIC PANEL - Abnormal; Notable for the following components:   Chloride 95 (*)    CO2 34 (*)    Glucose, Bld 128 (*)    Albumin 3.4 (*)    Total Bilirubin 1.3 (*)    All other components within normal limits  CBC WITH DIFFERENTIAL/PLATELET - Abnormal; Notable for the following components:   WBC 17.2 (*)    Hemoglobin 11.6 (*)    Platelets 66 (*)    Neutro Abs 13.1 (*)    Monocytes Absolute 3.3 (*)    All other components within normal limits  CBG MONITORING, ED - Abnormal; Notable for the following components:   Glucose-Capillary 125 (*)    All other components within normal limits  POCT I-STAT EG7 - Abnormal; Notable for the following components:   pH, Ven 7.543 (*)    pO2, Ven 128.0 (*)    Bicarbonate 39.9 (*)    TCO2 41 (*)    Acid-Base Excess 16.0 (*)    Calcium, Ion 1.07 (*)    HCT 35.0 (*)    Hemoglobin 11.9 (*)    All other components within normal limits  SARS CORONAVIRUS 2 (HOSPITAL ORDER, [facility])  URINE CULTURE  ETHANOL  URINALYSIS, ROUTINE W REFLEX MICROSCOPIC  I-STAT CREATININE, ED  CBG MONITORING, ED    EKG EKG Interpretation  Date/Time:  Friday Oct 08 2018 15:33:39 EDT Ventricular Rate:  109 PR Interval:    QRS Duration: 77 QT Interval:  329 QTC Calculation: 443 R Axis:   -18 Text Interpretation:  Sinus tachycardia Probable left atrial enlargement Borderline left axis deviation Borderline low voltage, extremity leads No significant change since last tracing Confirmed by Deno Etienne [id number]) on 10/08/2018 3:52:44 PM   Radiology Ct Head Wo Contrast  Result Date: 10/08/2018 CLINICAL DATA:  Weakness. Altered level of consciousness. EXAM: CT HEAD WITHOUT CONTRAST TECHNIQUE: Contiguous axial images were obtained from the base of the skull through the vertex without intravenous contrast. COMPARISON:  09/26/2018 FINDINGS: Brain: Subdural hematoma over the right cerebral convexity has enlarged and now  measures up to 16 mm in thickness in the frontal region, however the dominant portion of the hematoma is now hypoattenuating with a small amount of more hyperattenuating blood products posteriorly. There is new small volume acute subarachnoid hemorrhage primarily in right parieto-occipital sulci. A small residual left-sided subdural hematoma is lower in density than that on the prior study and measures up to 5 mm in thickness in the parietal region. Intracranial mass effect has increased with partial effacement of the right greater than left lateral ventricles and 12 mm of leftward midline shift (previously 1-2 mm). No acute large territory infarct is identified. Cerebral white matter hypodensities are similar to the prior study and nonspecific but compatible with mild chronic small vessel ischemic disease. Vascular: Calcified atherosclerosis at the skull base. Skull: No fracture or focal osseous lesion. Sinuses/Orbits: Visualized paranasal sinuses and mastoid air cells are clear. Bilateral cataract extraction is noted. Other: Small residual right periorbital hematoma, decreased from prior. IMPRESSION: 1. Increased size of right-sided subdural hematoma with increased mass effect including 12 mm of leftward midline shift. 2. New small volume acute subarachnoid hemorrhage. 3. Persistent small left-sided subdural hematoma. Critical Value/emergent results  were called by telephone at the time of interpretation on 10/08/2018 at 5:30 pm to Dr. Deno Etienne , who verbally acknowledged these results. Electronically Signed   By: Logan Bores M.D.   On: 10/08/2018 17:32   Dg Chest Port 1 View  Result Date: 10/08/2018 CLINICAL DATA:  Shortness of breath. History of lung cancer and breast cancer. EXAM: PORTABLE CHEST 1 VIEW COMPARISON:  09/26/2018 FINDINGS: The cardiomediastinal silhouette is unchanged with normal heart size. Volume loss is again seen in the right hemithorax. Density extending from the right hilum to the lateral  right midlung with regional architectural distortion is unchanged. No new airspace opacity, edema, pleural effusion, or pneumothorax is identified. Surgical clips project over the left breast. Proximal right humerus and right seventh rib fractures are again noted. IMPRESSION: Unchanged appearance of the chest including chronic post treatment changes in the right lung. No evidence of acute airspace disease. Electronically Signed   By: Logan Bores M.D.   On: 10/08/2018 16:32    Procedures Procedures (including critical care time)  Medications Ordered in ED Medications  sodium chloride 0.9 % bolus 1,000 mL (0 mLs Intravenous Stopped 10/08/18 1819)     Initial Impression / Assessment and Plan / ED Course  I have reviewed the triage vital signs and the nursing notes.  Pertinent labs & imaging results that were available during my care of the patient were reviewed by me and considered in my medical decision making (see chart for details).        73 yo F with a chief complaint of weakness.  Patient is chronically on 3 L of oxygen at all times has a history of COPD schizoaffective disorder and was recently just in the hospital for subdural hematoma with shift.  Will obtain a CT scan of the head to reevaluate the bleed.  Lab work including troponin.    CT scan of the head viewed by me with increasing right subdural hematoma.  I got a call from the radiologist and discussed the read with him, also new subarachnoid.  I had discussed the case with Dr. Venetia Constable, neurosurgery.  Felt that the patient likely needed to come in for medical admission for encephalopathy work-up to evaluate for subclinical seizures.  Will discuss with the hospitalist.  CRITICAL CARE Performed by: Cecilio Asper   Total critical care time: 35 minutes  Critical care time was exclusive of separately billable procedures and treating other patients.  Critical care was necessary to treat or prevent imminent or  life-threatening deterioration.  Critical care was time spent personally by me on the following activities: development of treatment plan with patient and/or surrogate as well as nursing, discussions with consultants, evaluation of patient's response to treatment, examination of patient, obtaining history from patient or surrogate, ordering and performing treatments and interventions, ordering and review of laboratory studies, ordering and review of radiographic studies, pulse oximetry and re-evaluation of patient's condition.  The patients results and plan were reviewed and discussed.   Any x-rays performed were independently reviewed by myself.   Differential diagnosis were considered with the presenting HPI.  Medications  sodium chloride 0.9 % bolus 1,000 mL (0 mLs Intravenous Stopped 10/08/18 1819)    Vitals:   10/08/18 1645 10/08/18 1745 10/08/18 1800 10/08/18 1815  BP: 134/71 (!) 120/58 (!) 121/59 (!) 130/59  Pulse: 99 94 100 89  Resp: (!) 29 (!) 25 (!) 22 (!) 25  Temp:      TempSrc:  SpO2: 96% 99% 98% 98%  Weight:      Height:        Final diagnoses:  SAH (subarachnoid hemorrhage) ([place])    Admission/ observation were discussed with the admitting physician, patient and/or family and they are comfortable with the plan.   Final Clinical Impressions(s) / ED Diagnoses   Final diagnoses:  SAH (subarachnoid hemorrhage) [facility])    ED Discharge Orders    None       Deno Etienne, DO 10/08/18 1839

## 2018-10-08 NOTE — Telephone Encounter (Signed)
Received call from [place], [place] with [facility] [phone]) stating that they are not able to draw a type and screen/ Blood bank hold lab because they do not do home blood transfusions.  Colletta [place] also stated that the patients PCP had ordered a CBC and CMET to be drawn today.  RN stated that on Monday 10/11/2018 she will fax [place] the lab results for Dr. Lindi Adie to review.

## 2018-10-08 NOTE — ED Notes (Signed)
ED TO INPATIENT HANDOFF REPORT  ED Nurse Name and Phone #: Caryl Pina [phone]  S Name/Age/Gender Summer Williams 73 y.o. female Room/Bed: 035C/035C  Code Status   Code Status: Prior  Home/SNF/Other Home Patient oriented to: self, place, time and situation Is this baseline? Yes   Triage Complete: Triage complete  Chief Complaint Lethargy   Triage Note Pt here from home with c/o weakness , cbg 134 , pt was recently hospital for a fall last month , pt alert and oriented but sleepy     Allergies No Known Allergies  Level of Care/Admitting Diagnosis ED Disposition    ED Disposition Condition [place]: [place] [[id number]]  Level of Care: Progressive [102]  Covid Evaluation: N/A  Diagnosis: SAH (subarachnoid hemorrhage) ([place]) [614431]  Admitting Physician: Elwyn Reach [[id number]]  Attending Physician: Elwyn Reach [[id number]]  Estimated length of stay: past midnight tomorrow  Certification:: I certify this patient will need inpatient services for at least 2 midnights  PT Class (Do Not Modify): Inpatient [101]  PT Acc Code (Do Not Modify): Private [1]       B Medical/Surgery History Past Medical History:  Diagnosis Date  . Breast cancer ([place]) 01/05/14   left breast  . COPD (chronic obstructive pulmonary disease) ([place])   . Depression   . Diverticulosis   . History of alcohol abuse    last 1980  . Hyperlipidemia   . Internal hemorrhoids   . Lung cancer ([place])   . Oxygen deficiency   . Personal history of radiation therapy 2015  . Radiation 03/23/14-04/19/14   Left breast  . Radiation 12/13/14-01/19/15   right upper/middle lobe of lung 70.2 Gy  . Requires supplemental oxygen    at night USES 2 LITERS   . Shortness of breath     WITH ACTIVITY  . Skin cancer    Squamous  . Vitamin D deficiency   . Wears glasses    Past Surgical History:  Procedure Laterality Date  . BREAST BIOPSY Left 01/15/2014   x2  . BREAST LUMPECTOMY  Left 2015   radiation  . BREAST SURGERY  02/09/2014   Left luimpectomy  . CHOLECYSTECTOMY  2003  . COLONOSCOPY WITH PROPOFOL N/A 11/08/2014   Procedure: COLONOSCOPY WITH PROPOFOL;  Surgeon: Inda Castle, MD;  Location: [facility];  Service: Endoscopy;  Laterality: N/A;  . DENTAL SURGERY     implants  . EYE SURGERY Bilateral 2005   Cataract OD  . ORIF HUMERUS FRACTURE Right 09/30/2018   Procedure: OPEN REDUCTION INTERNAL FIXATION (ORIF) DISTAL HUMERUS FRACTURE;  Surgeon: Renette Butters, MD;  Location: [facility];  Service: Orthopedics;  Laterality: Right;  . ORIF WRIST FRACTURE Right 09/30/2018   Procedure: OPEN REDUCTION INTERNAL FIXATION (ORIF) WRIST FRACTURE;  Surgeon: Renette Butters, MD;  Location: [facility];  Service: Orthopedics;  Laterality: Right;  Marland Kitchen VIDEO BRONCHOSCOPY WITH ENDOBRONCHIAL NAVIGATION N/A 11/10/2014   Procedure: VIDEO BRONCHOSCOPY WITH ENDOBRONCHIAL NAVIGATION;  Surgeon: Melrose Nakayama, MD;  Location: [facility];  Service: Thoracic;  Laterality: N/A;     A IV Location/Drains/Wounds Patient Lines/Drains/Airways Status   Active Line/Drains/Airways    Name:   Placement date:   Placement time:   Site:   Days:   Peripheral IV 11/10/14 Left Wrist   11/10/14    0705    Wrist   1428   Peripheral IV 10/08/18 Left Wrist   10/08/18    -  Wrist   less than 1   Airway 7 mm   09/30/18    0755     8   Incision (Closed) 02/09/14 Breast Left   02/09/14    1208     1702   Incision (Closed) 02/09/14 Axilla Left   02/09/14    1208     1702   Incision (Closed) 09/27/18 Eye Right   09/27/18    0100     11   Incision (Closed) 09/30/18 Arm Right   09/30/18    1027     8   Incision (Closed) 09/30/18 Wrist Right   09/30/18    1048     8          Intake/Output Last 24 hours  Intake/Output Summary (Last 24 hours) at 10/08/2018 1911 Last data filed at 10/08/2018 1819 Gross per 24 hour  Intake 1000 ml  Output -  Net 1000 ml    Labs/Imaging Results for orders placed or performed  during the hospital encounter of 10/08/18 (from the past 48 hour(s))  Ammonia     Status: Abnormal   Collection Time: 10/08/18  3:37 PM  Result Value Ref Range   Ammonia 46 (H) 9 - 35 umol/L    Comment: Performed at [facility], [place] [address], [place], [place] [address]  Comprehensive metabolic panel     Status: Abnormal   Collection Time: 10/08/18  3:37 PM  Result Value Ref Range   Sodium 140 135 - 145 mmol/L   Potassium 3.7 3.5 - 5.1 mmol/L   Chloride 95 (L) 98 - 111 mmol/L   CO2 34 (H) 22 - 32 mmol/L   Glucose, Bld 128 (H) 70 - 99 mg/dL   BUN 9 8 - 23 mg/dL   Creatinine, Ser 0.53 0.44 - 1.00 mg/dL   Calcium 9.2 8.9 - 10.3 mg/dL   Total Protein 7.2 6.5 - 8.1 g/dL   Albumin 3.4 (L) 3.5 - 5.0 g/dL   AST 18 15 - 41 U/L   ALT 13 0 - 44 U/L   Alkaline Phosphatase 87 38 - 126 U/L   Total Bilirubin 1.3 (H) 0.3 - 1.2 mg/dL   GFR calc non Af Amer >60 >60 mL/min   GFR calc Af Amer >60 >60 mL/min   Anion gap 11 5 - 15    Comment: Performed at [facility] [address], [place], [place] [address]  Ethanol     Status: None   Collection Time: 10/08/18  3:37 PM  Result Value Ref Range   Alcohol, Ethyl (B) <10 <10 mg/dL    Comment: (NOTE) Lowest detectable limit for serum alcohol is 10 mg/dL. For medical purposes only. Performed at [facility], [place] [address], [place], [place] [address]   CBC WITH DIFFERENTIAL     Status: Abnormal   Collection Time: 10/08/18  3:37 PM  Result Value Ref Range   WBC 17.2 (H) 4.0 - 10.5 K/uL   RBC 4.03 3.87 - 5.11 MIL/uL   Hemoglobin 11.6 (L) 12.0 - 15.0 g/dL   HCT 36.4 36.0 - 46.0 %   MCV 90.3 80.0 - 100.0 fL   MCH 28.8 26.0 - 34.0 pg   MCHC 31.9 30.0 - 36.0 g/dL   RDW 14.5 11.5 - 15.5 %   Platelets 66 (L) 150 - 400 K/uL    Comment: REPEATED TO VERIFY SPECIMEN CHECKED FOR CLOTS Immature Platelet Fraction may be clinically indicated, consider ordering this additional  test [id number] CONSISTENT WITH PREVIOUS RESULT    nRBC  0.0 0.0 - 0.2 %   Neutrophils Relative % 76 %   Neutro Abs 13.1 (H) 1.7 - 7.7 K/uL   Lymphocytes Relative 5 %   Lymphs Abs 0.9 0.7 - 4.0 K/uL   Monocytes Relative 19 %   Monocytes Absolute 3.3 (H) 0.1 - 1.0 K/uL   Eosinophils Relative 0 %   Eosinophils Absolute 0.0 0.0 - 0.5 K/uL   Basophils Relative 0 %   Basophils Absolute 0.0 0.0 - 0.1 K/uL   Abs Immature Granulocytes 0.00 0.00 - 0.07 K/uL    Comment: Performed at [facility] [address] [place], [place] [address]  CBG monitoring, ED     Status: Abnormal   Collection Time: 10/08/18  3:54 PM  Result Value Ref Range   Glucose-Capillary 125 (H) 70 - 99 mg/dL  SARS Coronavirus 2 (CEPHEID - Performed in [facility] hospital lab), Hosp Order     Status: None   Collection Time: 10/08/18  3:55 PM  Result Value Ref Range   SARS Coronavirus 2 NEGATIVE NEGATIVE    Comment: (NOTE) If result is NEGATIVE SARS-CoV-2 target nucleic acids are NOT DETECTED. The SARS-CoV-2 RNA is generally detectable in upper and lower  respiratory specimens during the acute phase of infection. The lowest  concentration of SARS-CoV-2 viral copies this assay can detect is 250  copies / mL. A negative result does not preclude SARS-CoV-2 infection  and should not be used as the sole basis for treatment or other  patient management decisions.  A negative result may occur with  improper specimen collection / handling, submission of specimen other  than nasopharyngeal swab, presence of viral mutation(s) within the  areas targeted by this assay, and inadequate number of viral copies  (<250 copies / mL). A negative result must be combined with clinical  observations, patient history, and epidemiological information. If result is POSITIVE SARS-CoV-2 target nucleic acids are DETECTED. The SARS-CoV-2 RNA is generally detectable in upper and lower  respiratory specimens dur ing the acute phase of infection.  Positive  results are indicative of active  infection with SARS-CoV-2.  Clinical  correlation with patient history and other diagnostic information is  necessary to determine patient infection status.  Positive results do  not rule out bacterial infection or co-infection with other viruses. If result is PRESUMPTIVE POSTIVE SARS-CoV-2 nucleic acids MAY BE PRESENT.   A presumptive positive result was obtained on the submitted specimen  and confirmed on repeat testing.  While 2019 novel coronavirus  (SARS-CoV-2) nucleic acids may be present in the submitted sample  additional confirmatory testing may be necessary for epidemiological  and / or clinical management purposes  to differentiate between  SARS-CoV-2 and other Sarbecovirus currently known to infect humans.  If clinically indicated additional testing with an alternate test  methodology [phone]) is advised. The SARS-CoV-2 RNA is generally  detectable in upper and lower respiratory sp ecimens during the acute  phase of infection. The expected result is Negative. Fact Sheet for Patients:  StrictlyIdeas.no Fact Sheet for Healthcare Providers: BankingDealers.co.za This test is not yet approved or cleared by the [place] FDA and has been authorized for detection and/or diagnosis of SARS-CoV-2 by FDA under an Emergency Use Authorization (EUA).  This EUA will remain in effect (meaning this test can be used) for the duration of the COVID-19 declaration under Section 564(b)(1) of the Act, 21 U.S.C. section 360bbb-3(b)(1), unless the authorization is  terminated or revoked sooner. Performed at [facility], [place] [address], [place], [place] [address]   I-stat Creatinine, ED     Status: None   Collection Time: 10/08/18  4:06 PM  Result Value Ref Range   Creatinine, Ser 0.50 0.44 - 1.00 mg/dL  POCT I-Stat EG7     Status: Abnormal   Collection Time: 10/08/18  4:06 PM  Result Value Ref Range   pH, Ven 7.543 (H) 7.250 - 7.430    pCO2, Ven 46.3 44.0 - 60.0 mmHg   pO2, Ven 128.0 (H) 32.0 - 45.0 mmHg   Bicarbonate 39.9 (H) 20.0 - 28.0 mmol/L   TCO2 41 (H) 22 - 32 mmol/L   O2 Saturation 99.0 %   Acid-Base Excess 16.0 (H) 0.0 - 2.0 mmol/L   Sodium 136 135 - 145 mmol/L   Potassium 3.7 3.5 - 5.1 mmol/L   Calcium, Ion 1.07 (L) 1.15 - 1.40 mmol/L   HCT 35.0 (L) 36.0 - 46.0 %   Hemoglobin 11.9 (L) 12.0 - 15.0 g/dL   Patient temperature HIDE    Sample type VENOUS    Ct Head Wo Contrast  Result Date: 10/08/2018 CLINICAL DATA:  Weakness. Altered level of consciousness. EXAM: CT HEAD WITHOUT CONTRAST TECHNIQUE: Contiguous axial images were obtained from the base of the skull through the vertex without intravenous contrast. COMPARISON:  09/26/2018 FINDINGS: Brain: Subdural hematoma over the right cerebral convexity has enlarged and now measures up to 16 mm in thickness in the frontal region, however the dominant portion of the hematoma is now hypoattenuating with a small amount of more hyperattenuating blood products posteriorly. There is new small volume acute subarachnoid hemorrhage primarily in right parieto-occipital sulci. A small residual left-sided subdural hematoma is lower in density than that on the prior study and measures up to 5 mm in thickness in the parietal region. Intracranial mass effect has increased with partial effacement of the right greater than left lateral ventricles and 12 mm of leftward midline shift (previously 1-2 mm). No acute large territory infarct is identified. Cerebral white matter hypodensities are similar to the prior study and nonspecific but compatible with mild chronic small vessel ischemic disease. Vascular: Calcified atherosclerosis at the skull base. Skull: No fracture or focal osseous lesion. Sinuses/Orbits: Visualized paranasal sinuses and mastoid air cells are clear. Bilateral cataract extraction is noted. Other: Small residual right periorbital hematoma, decreased from prior. IMPRESSION: 1.  Increased size of right-sided subdural hematoma with increased mass effect including 12 mm of leftward midline shift. 2. New small volume acute subarachnoid hemorrhage. 3. Persistent small left-sided subdural hematoma. Critical Value/emergent results were called by telephone at the time of interpretation on 10/08/2018 at 5:30 pm to Dr. Deno Etienne , who verbally acknowledged these results. Electronically Signed   By: Logan Bores M.D.   On: 10/08/2018 17:32   Dg Chest Port 1 View  Result Date: 10/08/2018 CLINICAL DATA:  Shortness of breath. History of lung cancer and breast cancer. EXAM: PORTABLE CHEST 1 VIEW COMPARISON:  09/26/2018 FINDINGS: The cardiomediastinal silhouette is unchanged with normal heart size. Volume loss is again seen in the right hemithorax. Density extending from the right hilum to the lateral right midlung with regional architectural distortion is unchanged. No new airspace opacity, edema, pleural effusion, or pneumothorax is identified. Surgical clips project over the left breast. Proximal right humerus and right seventh rib fractures are again noted. IMPRESSION: Unchanged appearance of the chest including chronic post treatment changes in the right lung. No  evidence of acute airspace disease. Electronically Signed   By: Logan Bores M.D.   On: 10/08/2018 16:32    Pending Labs Unresulted Labs (From admission, onward)    Start     Ordered   10/08/18 1537  Urine culture  ONCE - STAT,   STAT     10/08/18 1538   10/08/18 1537  Urinalysis, Routine w reflex microscopic (not at [facility])  ONCE - STAT,   STAT     10/08/18 1538          Vitals/Pain Today's Vitals   10/08/18 1800 10/08/18 1815 10/08/18 1819 10/08/18 1845  BP: (!) 121/59 (!) 130/59  117/67  Pulse: 100 89  98  Resp: (!) 22 (!) 25  (!) 29  Temp:      TempSrc:      SpO2: 98% 98%  96%  Weight:      Height:      PainSc:   Asleep     Isolation Precautions No active isolations  Medications Medications  sodium  chloride 0.9 % bolus 1,000 mL (0 mLs Intravenous Stopped 10/08/18 1819)    Mobility walks with person assist High fall risk   Focused Assessments Pulmonary Assessment Handoff:  Lung sounds: Bilateral Breath Sounds: Coarse crackles L Breath Sounds: Coarse crackles R Breath Sounds: Coarse crackles O2 Device: Nasal Cannula O2 Flow Rate (L/min): 3 L/min      R Recommendations: See Admitting Provider Note  Report given to:   Additional Notes:

## 2018-10-09 ENCOUNTER — Other Ambulatory Visit: Payer: Self-pay

## 2018-10-09 DIAGNOSIS — C342 Malignant neoplasm of middle lobe, bronchus or lung: Secondary | ICD-10-CM

## 2018-10-09 DIAGNOSIS — S065X9A Traumatic subdural hemorrhage with loss of consciousness of unspecified duration, initial encounter: Secondary | ICD-10-CM

## 2018-10-09 DIAGNOSIS — J9611 Chronic respiratory failure with hypoxia: Secondary | ICD-10-CM

## 2018-10-09 LAB — CBC
HCT: 31.1 % — ABNORMAL LOW (ref 36.0–46.0)
Hemoglobin: 10.1 g/dL — ABNORMAL LOW (ref 12.0–15.0)
MCH: 29 pg (ref 26.0–34.0)
MCHC: 32.5 g/dL (ref 30.0–36.0)
MCV: 89.4 fL (ref 80.0–100.0)
Platelets: 59 10*3/uL — ABNORMAL LOW (ref 150–400)
RBC: 3.48 MIL/uL — ABNORMAL LOW (ref 3.87–5.11)
RDW: 14.5 % (ref 11.5–15.5)
WBC: 14.1 10*3/uL — ABNORMAL HIGH (ref 4.0–10.5)
nRBC: 0 % (ref 0.0–0.2)

## 2018-10-09 LAB — COMPREHENSIVE METABOLIC PANEL
ALT: 11 U/L (ref 0–44)
AST: 15 U/L (ref 15–41)
Albumin: 3 g/dL — ABNORMAL LOW (ref 3.5–5.0)
Alkaline Phosphatase: 79 U/L (ref 38–126)
Anion gap: 10 (ref 5–15)
BUN: 5 mg/dL — ABNORMAL LOW (ref 8–23)
CO2: 33 mmol/L — ABNORMAL HIGH (ref 22–32)
Calcium: 8.7 mg/dL — ABNORMAL LOW (ref 8.9–10.3)
Chloride: 98 mmol/L (ref 98–111)
Creatinine, Ser: 0.36 mg/dL — ABNORMAL LOW (ref 0.44–1.00)
GFR calc Af Amer: >60 mL/min (ref >60)
GFR calc non Af Amer: >60 mL/min (ref >60)
Glucose, Bld: 140 mg/dL — ABNORMAL HIGH (ref 70–99)
Potassium: 3.4 mmol/L — ABNORMAL LOW (ref 3.5–5.1)
Sodium: 141 mmol/L (ref 135–145)
Total Bilirubin: 0.9 mg/dL (ref 0.3–1.2)
Total Protein: 6.5 g/dL (ref 6.5–8.1)

## 2018-10-09 LAB — URINALYSIS, ROUTINE W REFLEX MICROSCOPIC
Bacteria, UA: NONE SEEN
Bilirubin Urine: NEGATIVE
Glucose, UA: NEGATIVE mg/dL
Ketones, ur: NEGATIVE mg/dL
Leukocytes,Ua: NEGATIVE
Nitrite: NEGATIVE
Protein, ur: 30 mg/dL — AB
Specific Gravity, Urine: 1.016 (ref 1.005–1.030)
pH: 7 (ref 5.0–8.0)

## 2018-10-09 MED ORDER — LEVETIRACETAM 500 MG PO TABS
500.0000 mg | ORAL_TABLET | Freq: Two times a day (BID) | ORAL | Status: DC
  Administered 2018-10-09 – 2018-10-10 (×2): 500 mg via ORAL
  Filled 2018-10-09 (×2): qty 1

## 2018-10-09 MED ORDER — LORAZEPAM 2 MG/ML IJ SOLN: 1.0000 mg | INTRAMUSCULAR | Status: DC | PRN

## 2018-10-09 MED ORDER — MORPHINE SULFATE (CONCENTRATE) 10 MG/0.5ML PO SOLN
10.0000 mg | ORAL | Status: DC | PRN
  Administered 2018-10-09 – 2018-10-10 (×2): 10 mg via ORAL
  Filled 2018-10-09 (×2): qty 0.5

## 2018-10-09 MED ORDER — SODIUM CHLORIDE 0.9% FLUSH: 3.0000 mL | INTRAVENOUS | Status: DC | PRN

## 2018-10-09 MED ORDER — ACETAMINOPHEN 500 MG PO TABS
1000.0000 mg | ORAL_TABLET | Freq: Three times a day (TID) | ORAL | Status: DC
  Administered 2018-10-09 – 2018-10-10 (×2): 1000 mg via ORAL
  Filled 2018-10-09 (×2): qty 2

## 2018-10-09 MED ORDER — SODIUM CHLORIDE 0.9% FLUSH
3.0000 mL | Freq: Two times a day (BID) | INTRAVENOUS | Status: DC
  Administered 2018-10-09 – 2018-10-10 (×3): 3 mL via INTRAVENOUS

## 2018-10-09 NOTE — Progress Notes (Signed)
Met with patient's son and daughter in law at bedside. CSW shared that the patient had wanted residential hospice. CSW explained the process and answered questions. The family wanted to speak with the patient about residential hospice versus home with hospice.   The patient wanted residential hospice, I believe that the son just needed some time to process.   CSW will continue to assist with disposition.   Domenic Schwab, MSW, [place]

## 2018-10-09 NOTE — Progress Notes (Addendum)
[facility] [id number] -- Manufacturing engineer (ACC) RN Note @ 1157  Received request from [place], [place] for patient interest in [facility]. Called bedside RN, Shawn Route, for report. Marge states patient had just received pain medication and was not able to talk on phone. Marge reports she has had multiple conversations with patient regarding desire for EOL at [facility]. Marge shares with me son Jeneen Rinks is HCPOA and I have permission to call him.  Called Jeneen Rinks and daughter-in-law Chrys Racer. Questioning whether [facility] or Home with hospice would best serve patient and family. Phone conversation by conference call with Dr. Hilma Favors, myself, Jeneen Rinks and [place] answering questions and providing support. Family to visit with patient tonight at hospital. They have been given my number to call if any further questions or concerns. At this time, the plan is for me to reach out to them tomorrow morning to see how to proceed.  Please feel free to call with any concerns or questions.  Thank you, Margaretmary Eddy, RN, BSN [place] [phone]  [place] are on [place]

## 2018-10-09 NOTE — Progress Notes (Signed)
CSW heard back from [place], Olivia Mackie spoke with the patient's family. They were blind sided that the patient now needed hospice. CSW will call the family and discuss. According to Olivia Mackie, the MD is going to get permission for the son and daughter-in-law to come up to the hospital to visit the patient. At that point, collectively they will decide if they want to go home with hospice or pursue residential hospice.   CSW called the patient the patient's son and he requested to speak with the CSW in the room once they got to the hospital.   CSW will continue to assist.   Domenic Schwab, MSW, [place]

## 2018-10-09 NOTE — TOC Initial Note (Signed)
Transition of Care [facility]) - Initial/Assessment Note    Patient Details  Name: Summer Williams MRN: [id number] Date of Birth: 11-08-1945  Transition of Care [facility]) CM/SW Contact:    Gelene Mink, [place] Phone Number: 10/09/2018, 2:33 PM  Clinical Narrative:                  CSW met with the patient at bedside. The patient is agreeable to going to SNF. Patient is wanting to involve hospice. She does not have a preference about returning home with hospice or going to a facility and having hospice follow. CSW suggested that she meet with palliative to see what they had to say, then the CSW could help facilitate a disposition   Palliative had a pending consult.   Palliative met with the patient and the patient decided that she wanted residential hospice. The patient chose [facility]. CSW called and spoke with on-call nurse liaison Olivia Mackie and made the referral. She stated that [facility] does have a bed available today but she needed to double check to see if they would be able to accept the patient. Olivia Mackie stated that she would call the CSW back.   Expected Discharge Plan: [place] Barriers to Discharge: Continued Medical Work up   Patient Goals and CMS Choice Patient states their goals for this hospitalization and ongoing recovery are:: Pt would like to go to a hospice facility   Choice offered to / list presented to : NA  Expected Discharge Plan and Services Expected Discharge Plan: [place] In-house Referral: Clinical Social Work Discharge Planning Services: NA Post Acute Care Choice: Hospice Living arrangements for the past 2 months: Single Family Home                 DME Arranged: N/A DME Agency: NA       HH Arranged: NA [place] Agency: NA        Prior Living Arrangements/Services Living arrangements for the past 2 months: Single Family Home Lives with:: Self   Do you feel safe going back to the place where you live?: Yes      Need for  Family Participation in Patient Care: No (Comment) Care giver support system in place?: Yes (comment) Current home services: Home OT, Home PT, Homehealth aide Criminal Activity/Legal Involvement Pertinent to Current Situation/Hospitalization: No - Comment as needed  Activities of Daily Living Home Assistive Devices/Equipment: Shower chair with back, Grab bars around toilet ADL Screening (condition at time of admission) Patient's cognitive ability adequate to safely complete daily activities?: No Is the patient deaf or have difficulty hearing?: Yes Does the patient have difficulty seeing, even when wearing glasses/contacts?: No Does the patient have difficulty concentrating, remembering, or making decisions?: No Patient able to express need for assistance with ADLs?: Yes Does the patient have difficulty dressing or bathing?: Yes Independently performs ADLs?: No Communication: Independent Dressing (OT): Needs assistance Is this a change from baseline?: Pre-admission baseline Grooming: Dependent Is this a change from baseline?: Pre-admission baseline Feeding: Needs assistance, Dependent Is this a change from baseline?: Pre-admission baseline Bathing: Needs assistance, Dependent Is this a change from baseline?: Pre-admission baseline Toileting: Needs assistance Is this a change from baseline?: Pre-admission baseline In/Out Bed: Needs assistance Is this a change from baseline?: Pre-admission baseline Walks in Home: Needs assistance Is this a change from baseline?: Pre-admission baseline Does the patient have difficulty walking or climbing stairs?: Yes Weakness of Legs: Both Weakness of Arms/Hands: Right  Permission Sought/Granted Permission  sought to share information with : Case Manager Permission granted to share information with : Yes, Verbal Permission Granted  Share Information with NAME: Jeneen Rinks  Permission granted to share info w AGENCY: [facility] and SNF  Permission granted  to share info w Relationship: Son     Emotional Assessment Appearance:: Appears stated age Attitude/Demeanor/Rapport: Engaged Affect (typically observed): Calm Orientation: : Oriented to Self, Oriented to  Time, Oriented to Place, Oriented to Situation Alcohol / Substance Use: Not Applicable Psych Involvement: No (comment)  Admission diagnosis:  SAH (subarachnoid hemorrhage) ([place]) [I60.9] Patient Active Problem List   Diagnosis Date Noted  . SAH (subarachnoid hemorrhage) ([place]) 10/08/2018  . Subdural hematoma ([place]) 09/27/2018  . Closed displaced fracture of greater tuberosity of right humerus   . Closed nondisplaced fracture of styloid process of right ulna   . 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 Chronic respiratory failure with hypoxia ([place]) 05/05/2017  . Bone metastasis ([place]) 03/20/2016  . DNR (do not resuscitate) 03/10/2016  . Pain from bone metastases ([place]) 03/10/2016  . 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 Nonspecific abnormal finding in stool contents 11/08/2014  . Diverticulosis of colon without hemorrhage 11/08/2014  . Internal hemorrhoids 11/08/2014  . Abnormal CT scan, chest 11/06/2014  . Encounter for screening colonoscopy 08/22/2014  . Colon cancer screening 08/22/2014  . Diarrhea 03/01/2014  . Cancer of lower-inner quadrant of left female breast ([place]) 01/09/2014  . Dyspnea 08/30/2012  . Elevated liver enzymes 05/03/2011  . Hyperlipidemia 04/11/2011  . Schizoaffective disorder ([place]) 04/11/2011  . COPD exacerbation ([place]) 04/11/2011  . Vitamin D deficiency 04/11/2011   PCP:  Elby Showers, MD Pharmacy:   [id number] [id number] [id number]: [phone]: [phone]    Social Determinants of Health (SDOH) Interventions    Readmission Risk Interventions Readmission Risk Prevention Plan 10/09/2018  10/04/2018  Transportation Screening Complete Complete  PCP or Specialist Appt within 5-7 Days - Complete  PCP or Specialist Appt within 3-5 Days Not Complete -  Not Complete comments Will need to have appointment scheduled after hospitalization -  Home Care Screening - Complete  Medication Review (RN CM) - Complete  HRI or Home Care Consult Complete -  Social Work Consult for Recovery Care Planning/Counseling Complete -  Palliative Care Screening Not Complete -  Palliative Care Screening Not Complete Comments Palliative consult pending -  Medication Review (RN Care Manager) Complete -  Some recent data might be hidden

## 2018-10-09 NOTE — Consult Note (Addendum)
Palliative Care Consult Reason: Goals of Care Req: TRH  [name] is a 73 yo woman with lung cancer s/p radiation new metastatic disease, adamantly has refused chemotherapy. Increasing functional status decline and falls at home-recently admitted 5/3 after fall and possibly had another fall at home now re-admitted with worsening SDH, refused neurosurgical intervention. She is remarkably coherent presently and able to fully articulate her goals and needs. She wants hospice care and a complete focus on comfort and dignity and is fully aware she is approaching EOL.She has a son and daughter involved in her care, but she lived alone prior to admission--she tells me she has spoken with them and they are accepting this as the plan. She is endorsing generalized pain and has facial and extremity contusions.  She has metastatic lung cancer and has largely refused any diagnostics or treatment and this is her informed right and she has at this time full capacity and is clearly expressing her goals which are in line with previous documented discussion with oncology.  Brain CT:  1. Increased size of right-sided subdural hematoma with increased mass effect including 12 mm of leftward midline shift. 2. New small volume acute subarachnoid hemorrhage. 3. Persistent small left-sided subdural hematoma.  This will likely continue to expand and she will become increasingly more lethargic and have possible herniation. She is hospice facility appropriate prognostically- likely <2 weeks.  Significant injuries from her fall: Right Shoulder fracture on XR  Right Rib Fractures on XR Right displaced elbow and radial fracture SDH with significant midline shift   1. Hospice facility referral 2. DNR 3. Will add orders for pain control and comfort  Lane Hacker, DO Palliative Medicine [phone]   35 min Greater than 50%  of this time was spent counseling and coordinating care related to the above  assessment and plan.

## 2018-10-09 NOTE — Plan of Care (Signed)
  Problem: Coping: Goal: Level of anxiety will decrease Outcome: Adequate for Discharge   Problem: Pain Managment: Goal: General experience of comfort will improve Outcome: Progressing   Problem: Education: Goal: Knowledge of the prescribed therapeutic regimen will improve Outcome: Progressing   Problem: Coping: Goal: Ability to identify and develop effective coping behavior will improve Outcome: Progressing   Problem: Clinical Measurements: Goal: Quality of life will improve Outcome: Progressing   Problem: Respiratory: Goal: Verbalizations of increased ease of respirations will increase Outcome: Progressing   Problem: Role Relationship: Goal: Family's ability to cope with current situation will improve ( Family #James and wife Caroline# discussed Hospice care with palliative care team and with family members.  She states that she does not want any further surgeries and is wanting to participate in Residential H[facility] care) Outcome: Progressing Goal: Ability to verbalize concerns, feelings, and thoughts to partner or family member will improve Outcome: Progressing   Problem: Pain Management: Goal: Satisfaction with pain management regimen will improve (Patient responding well to Morphine for comfort.  Refer to pain scale) Outcome: Progressing

## 2018-10-09 NOTE — Consult Note (Signed)
Neurosurgery Consultation  Reason for Consult: Subdural hematoma Referring Physician: Jonelle Sidle  CC: RUE pain  HPI: This is a 73 y.o. woman that I previously saw for an acute subdural hematoma after a fall. Of note, she has metastatic lung Ca with COPD. She now returns with what sounds like a global decline from review of her records. When I asked the patient, she says she's here because her RUE arm pain is very severe. She does acknowledge that she feels globally weaker and "just doesn't have any strength". She denies any aura or seizure-like activity. She does have headaches but the RUE pain is significantly worse than the headache. She denies any new numbness or parasthesias.   ROS: A 14 point ROS was performed and is negative except as noted in the HPI.   PMHx:  Past Medical History:  Diagnosis Date  . Breast cancer ([place]) 01/05/14   left breast  . COPD (chronic obstructive pulmonary disease) ([place])   . Depression   . Diverticulosis   . History of alcohol abuse    last 1980  . Hyperlipidemia   . Internal hemorrhoids   . Lung cancer ([place])   . Oxygen deficiency   . Personal history of radiation therapy 2015  . Radiation 03/23/14-04/19/14   Left breast  . Radiation 12/13/14-01/19/15   right upper/middle lobe of lung 70.2 Gy  . Requires supplemental oxygen    at night USES 2 LITERS   . Shortness of breath     WITH ACTIVITY  . Skin cancer    Squamous  . Vitamin D deficiency   . Wears glasses    FamHx:  Family History  Problem Relation Age of Onset  . Heart disease Father   . Lung cancer Maternal Grandfather   . Leukemia Paternal Grandfather   . Cholelithiasis Mother   . Ovarian cancer Maternal Aunt   . Breast cancer Maternal Aunt   . Colon cancer Neg Hx   . Colon polyps Neg Hx   . Esophageal cancer Neg Hx   . Kidney disease Neg Hx   . Gallbladder disease Neg Hx   . Stomach cancer Neg Hx   . Rectal cancer Neg Hx    SocHx:  reports that she quit smoking about 13 years ago.  Her smoking use included cigarettes. She has a 50.00 pack-year smoking history. She has never used smokeless tobacco. She reports that she does not drink alcohol or use drugs.  Exam: Vital signs in last 24 hours: Temp:  [97.9 F (36.6 C)-100 F (37.8 C)] 98.4 F (36.9 C) (05/09 0759) Pulse Rate:  [89-110] 93 (05/09 0759) Resp:  [20-31] 20 (05/09 0759) BP: (116-138)/(58-83) 124/68 (05/09 0759) SpO2:  [93 %-100 %] 99 % (05/09 0759) Weight:  [71.7 kg-76.2 kg] 71.7 kg (05/08 2050) General: Awake, alert, cooperative, lying in bed in NAD, but appears chronically ill Head: normocephalic, resolving R periorbital ecchymosis HEENT: neck supple Pulmonary: breathing room supplemental O2 with accessory muscle use, speaks in short sentences to take a breath Cardiac: mildly tachy, but regular  Abdomen: S NT ND Extremities: warm and well perfused x3, RUE casted and very painful to any movement Neuro: AOx3, prefers to speak with her eyes closed, denies diplopia, FS and symmetrically sensate Strength diffusely 4-/5 x3 - unable to test RUE due to pain SILTx3 - unable to test RUE due to pain / cast Unable to test for prontator drift due to weakness  Assessment and Plan: 73 y.o. woman s/p fall with aSDH, now  with global decline and severe pain. [place] personally reviewed, which shows osmotic expansion of prior R SDH, now measuring 73mm with accompanying 11.42mm of midline shift.   -I discussed the above and explained it to the patient, who was able to repeat the findings back to me. I explained that the amount of blood was significant enough that, in the setting of a global decline, it could be related to the SDH and I would therefore recommend a burr hole to drain it. She is very confident that she does not want surgery of any kind and stated that she wants to go to hospice. The arm pain is the most troubling pain for her and the headaches are secondary. -if headaches become significant, can try dexemethasone  2mg  q8 x3d, there is some limited data of this improving symptoms from chronic subdural hematomas -she reports that she is going to hospice. If this is the case, then she does not need scheduled follow up with me.   Fable Part, MD 10/09/18 10:11 AM [facility]

## 2018-10-09 NOTE — Progress Notes (Signed)
Patient ID: Summer Williams, female   DOB: [birth year], 73 y.o.   MRN: [id number]  PROGRESS NOTE    AMAYA BLAKEMAN  WER:[id number] DOB: [birth year] DOA: 10/08/2018 PCP: Elby Showers, MD   Brief Narrative:  73 year old female with history of recent fall with intracranial hemorrhage/subdural hematoma with right humeral and ulnar fractures, depression, hyperlipidemia, COPD, breast cancer, lung cancer with metastases for which she has refused any diagnostics or treatment and chronic hypoxia who was discharged from the hospital on 10/02/2020 follow-up with orthopedics and oncologist presented on 10/08/2018 with worsening weakness.  CT of the head showed possible new subarachnoid hemorrhage along with increased size of right subdural hematoma with increased mass-effect with midline shift.  Neurosurgery was consulted.  Assessment & Plan:   Principal Problem:   SAH (subarachnoid hemorrhage) (HCC) Active Problems:   Hyperlipidemia   Schizoaffective disorder (HCC)   COPD exacerbation (HCC)   Cancer of lower-inner quadrant of left female breast ([place])   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 ([place])   DNR (do not resuscitate)   Chronic respiratory failure with hypoxia ([place])   Subdural hematoma (HCC)   Closed displaced fracture of greater tuberosity of right humerus   Worsening right subdural hematoma with mass-effect and midline shift New subarachnoid hemorrhage -Neurosurgery has been consulted. -Overall prognosis is guarded to poor.  We will also consult palliative care for goals of care discussion.  Patient probably should be made comfort measures/hospice.  Multiple humeral fractures/distal radius and ulnar styloid fracture -Right arm is immobilized. -Was recently evaluated and treated by orthopedics: Nonoperative management with nonweightbearing and sling recommended for right proximal humerus fracture -Underwent ORIF on 09/30/2018 for right distal humerus and distal radius fractures and  subsequent nonweightbearing, sling and compressive dressings -Outpatient follow-up with orthopedics  Metastatic lung cancer with chronic hypoxia History of breast cancer -Continue oxygen supplementation.  Patient has refused diagnostics and treatment for lung cancer -Palliative care evaluation  Schizoaffective disorder -Continue citalopram and olanzapine  Leukocytosis -Probably reactive  Thrombocytopenia -Questionable cause.   DVT prophylaxis: SCDs Code Status: DNR Family Communication: None at bedside  disposition Plan: Depends on neurosurgery and palliative care evaluation  Consultants: Neurosurgery/palliative care  Procedures: None  Antimicrobials: None   Subjective: Patient seen and examined at bedside.  She is awake, slow to respond to questions but answers some.  Complains of headache.  No overnight fever or vomiting reported.  Objective: Vitals:   10/09/18 0407 10/09/18 0451 10/09/18 0759 10/09/18 1148  BP: 132/77 125/73 124/68 136/73  Pulse:  (!) 102 93 96  Resp:  (!) 29 20 (!) 22  Temp: 98.9 F (37.2 C) 97.9 F (36.6 C) 98.4 F (36.9 C) 100.1 F (37.8 C)  TempSrc: Oral  Oral Axillary  SpO2:  93% 99% 100%  Weight:      Height:        Intake/Output Summary (Last 24 hours) at 10/09/2018 1353 Last data filed at 10/09/2018 0540 Gross per 24 hour  Intake 1771.31 ml  Output 900 ml  Net 871.31 ml   Filed Weights   10/08/18 1537 10/08/18 2050  Weight: 76.2 kg 71.7 kg    Examination:  General exam: Appears chronically ill looking.  Deconditioned.  Right periorbital ecchymosis Respiratory system: Bilateral decreased breath sounds at bases with scattered crackles Cardiovascular system: S1 & S2 heard, Rate controlled Gastrointestinal system: Abdomen is nondistended, soft and nontender. Normal bowel sounds heard. Extremities: No cyanosis, clubbing; trace edema.  Right upper extremity immobilized.  Data Reviewed: I have personally reviewed following labs  and imaging studies  CBC: Recent Labs  Lab 10/03/18 0411 10/08/18 1537 10/08/18 1606 10/09/18 0428  WBC 13.8* 17.2*  --  14.1*  NEUTROABS  --  13.1*  --   --   HGB 9.3* 11.6* 11.9* 10.1*  HCT 29.1* 36.4 35.0* 31.1*  MCV 89.3 90.3  --  89.4  PLT 60* 66*  --  59*   Basic Metabolic Panel: Recent Labs  Lab 10/03/18 0411 10/08/18 1537 10/08/18 1606 10/09/18 0428  NA 139 140 136 141  K 3.1* 3.7 3.7 3.4*  CL 96* 95*  --  98  CO2 30 34*  --  33*  GLUCOSE 119* 128*  --  140*  BUN 9 9  --  5*  CREATININE 0.44 0.53 0.50 0.36*  CALCIUM 8.9 9.2  --  8.7*   GFR: Estimated Creatinine Clearance: 60.6 mL/min (A) (by C-G formula based on SCr of 0.36 mg/dL (L)). Liver Function Tests: Recent Labs  Lab 10/08/18 1537 10/09/18 0428  AST 18 15  ALT 13 11  ALKPHOS 87 79  BILITOT 1.3* 0.9  PROT 7.2 6.5  ALBUMIN 3.4* 3.0*   No results for input(s): LIPASE, AMYLASE in the last 168 hours. Recent Labs  Lab 10/08/18 1537  AMMONIA 46*   Coagulation Profile: No results for input(s): INR, PROTIME in the last 168 hours. Cardiac Enzymes: No results for input(s): CKTOTAL, CKMB, CKMBINDEX, TROPONINI in the last 168 hours. BNP (last 3 results) No results for input(s): PROBNP in the last 8760 hours. HbA1C: No results for input(s): HGBA1C in the last 72 hours. CBG: Recent Labs  Lab 10/08/18 1554  GLUCAP 125*   Lipid Profile: No results for input(s): CHOL, HDL, LDLCALC, TRIG, CHOLHDL, LDLDIRECT in the last 72 hours. Thyroid Function Tests: No results for input(s): TSH, T4TOTAL, FREET4, T3FREE, THYROIDAB in the last 72 hours. Anemia Panel: No results for input(s): VITAMINB12, FOLATE, FERRITIN, TIBC, IRON, RETICCTPCT in the last 72 hours. Sepsis Labs: No results for input(s): PROCALCITON, LATICACIDVEN in the last 168 hours.  Recent Results (from the past 240 hour(s))  SARS Coronavirus 2 (CEPHEID - Performed in [place] hospital lab), Hosp Order     Status: None   Collection Time:  10/08/18  3:55 PM  Result Value Ref Range Status   SARS Coronavirus 2 NEGATIVE NEGATIVE Final    Comment: (NOTE) If result is NEGATIVE SARS-CoV-2 target nucleic acids are NOT DETECTED. The SARS-CoV-2 RNA is generally detectable in upper and lower  respiratory specimens during the acute phase of infection. The lowest  concentration of SARS-CoV-2 viral copies this assay can detect is 250  copies / mL. A negative result does not preclude SARS-CoV-2 infection  and should not be used as the sole basis for treatment or other  patient management decisions.  A negative result may occur with  improper specimen collection / handling, submission of specimen other  than nasopharyngeal swab, presence of viral mutation(s) within the  areas targeted by this assay, and inadequate number of viral copies  (<250 copies / mL). A negative result must be combined with clinical  observations, patient history, and epidemiological information. If result is POSITIVE SARS-CoV-2 target nucleic acids are DETECTED. The SARS-CoV-2 RNA is generally detectable in upper and lower  respiratory specimens dur ing the acute phase of infection.  Positive  results are indicative of active infection with SARS-CoV-2.  Clinical  correlation with patient history and other diagnostic information is  necessary  to determine patient infection status.  Positive results do  not rule out bacterial infection or co-infection with other viruses. If result is PRESUMPTIVE POSTIVE SARS-CoV-2 nucleic acids MAY BE PRESENT.   A presumptive positive result was obtained on the submitted specimen  and confirmed on repeat testing.  While 2019 novel coronavirus  (SARS-CoV-2) nucleic acids may be present in the submitted sample  additional confirmatory testing may be necessary for epidemiological  and / or clinical management purposes  to differentiate between  SARS-CoV-2 and other Sarbecovirus currently known to infect humans.  If clinically  indicated additional testing with an alternate test  methodology [phone]) is advised. The SARS-CoV-2 RNA is generally  detectable in upper and lower respiratory sp ecimens during the acute  phase of infection. The expected result is Negative. Fact Sheet for Patients:  StrictlyIdeas.no Fact Sheet for Healthcare Providers: BankingDealers.co.za This test is not yet approved or cleared by the [place] FDA and has been authorized for detection and/or diagnosis of SARS-CoV-2 by FDA under an Emergency Use Authorization (EUA).  This EUA will remain in effect (meaning this test can be used) for the duration of the COVID-19 declaration under Section 564(b)(1) of the Act, 21 U.S.C. section 360bbb-3(b)(1), unless the authorization is terminated or revoked sooner. Performed at [facility], [place] [address], [place], [place] [address]          Radiology Studies: Ct Head Wo Contrast  Result Date: 10/08/2018 CLINICAL DATA:  Weakness. Altered level of consciousness. EXAM: CT HEAD WITHOUT CONTRAST TECHNIQUE: Contiguous axial images were obtained from the base of the skull through the vertex without intravenous contrast. COMPARISON:  09/26/2018 FINDINGS: Brain: Subdural hematoma over the right cerebral convexity has enlarged and now measures up to 16 mm in thickness in the frontal region, however the dominant portion of the hematoma is now hypoattenuating with a small amount of more hyperattenuating blood products posteriorly. There is new small volume acute subarachnoid hemorrhage primarily in right parieto-occipital sulci. A small residual left-sided subdural hematoma is lower in density than that on the prior study and measures up to 5 mm in thickness in the parietal region. Intracranial mass effect has increased with partial effacement of the right greater than left lateral ventricles and 12 mm of leftward midline shift (previously 1-2 mm). No  acute large territory infarct is identified. Cerebral white matter hypodensities are similar to the prior study and nonspecific but compatible with mild chronic small vessel ischemic disease. Vascular: Calcified atherosclerosis at the skull base. Skull: No fracture or focal osseous lesion. Sinuses/Orbits: Visualized paranasal sinuses and mastoid air cells are clear. Bilateral cataract extraction is noted. Other: Small residual right periorbital hematoma, decreased from prior. IMPRESSION: 1. Increased size of right-sided subdural hematoma with increased mass effect including 12 mm of leftward midline shift. 2. New small volume acute subarachnoid hemorrhage. 3. Persistent small left-sided subdural hematoma. Critical Value/emergent results were called by telephone at the time of interpretation on 10/08/2018 at 5:30 pm to Dr. Deno Etienne , who verbally acknowledged these results. Electronically Signed   By: Logan Bores M.D.   On: 10/08/2018 17:32   Dg Chest Port 1 View  Result Date: 10/08/2018 CLINICAL DATA:  Shortness of breath. History of lung cancer and breast cancer. EXAM: PORTABLE CHEST 1 VIEW COMPARISON:  09/26/2018 FINDINGS: The cardiomediastinal silhouette is unchanged with normal heart size. Volume loss is again seen in the right hemithorax. Density extending from the right hilum to the lateral right midlung with regional architectural  distortion is unchanged. No new airspace opacity, edema, pleural effusion, or pneumothorax is identified. Surgical clips project over the left breast. Proximal right humerus and right seventh rib fractures are again noted. IMPRESSION: Unchanged appearance of the chest including chronic post treatment changes in the right lung. No evidence of acute airspace disease. Electronically Signed   By: Logan Bores M.D.   On: 10/08/2018 16:32        Scheduled Meds: . acetaminophen  1,000 mg Oral TID  . acidophilus  1 capsule Oral Daily  . cholestyramine  4 g Oral BID  .  citalopram  20 mg Oral Daily  . diltiazem  120 mg Oral Daily  . magnesium oxide  400 mg Oral Daily  . OLANZapine  2.5 mg Oral Daily   Continuous Infusions:   LOS: 1 day        Aline August, MD [facility] 10/09/2018, 1:53 PM

## 2018-10-09 NOTE — Progress Notes (Signed)
CSW called Olivia Mackie back with [facility] about a bed offer to inquire about an updated. CSW left a voicemail and is awaiting a return phone call.   CSW will continue to assist with disposition.   Domenic Schwab, MSW, [place]

## 2018-10-10 LAB — URINE CULTURE: Culture: NO GROWTH

## 2018-10-10 MED ORDER — ONDANSETRON HCL 4 MG PO TABS: 4.0000 mg | ORAL_TABLET | Freq: Four times a day (QID) | ORAL | Status: AC | PRN

## 2018-10-10 MED ORDER — ACETAMINOPHEN 500 MG PO TABS: 1000.0000 mg | ORAL_TABLET | Freq: Three times a day (TID) | ORAL | Status: AC

## 2018-10-10 MED ORDER — MORPHINE SULFATE (CONCENTRATE) 10 MG/0.5ML PO SOLN: 10.0000 mg | ORAL | Status: AC | PRN

## 2018-10-10 MED ORDER — LEVETIRACETAM 500 MG PO TABS: 500.0000 mg | ORAL_TABLET | Freq: Two times a day (BID) | ORAL | Status: AC

## 2018-10-10 MED ORDER — LORAZEPAM 0.5 MG PO TABS: 0.5000 mg | ORAL_TABLET | Freq: Three times a day (TID) | ORAL | Status: AC | PRN

## 2018-10-10 NOTE — TOC Transition Note (Signed)
Transition of Care [facility]) - CM/SW Discharge Note   Patient Details  Name: Summer Williams MRN: [id number] Date of Birth: [birth year]  [facility] CM/SW Contact:  Benard Halsted, LCSW Phone Number: 10/10/2018, 1:30 PM   Clinical Narrative:    Patient will DC to: [facility] Anticipated DC date: 10/10/18 Family notified: Son Transport by: Corey Harold   Per MD patient ready for DC to [facility]. RN, patient, patient's family, and facility notified of DC. Discharge Summary sent to facility. RN to call report prior to discharge [phone]). DC packet on chart. Ambulance transport requested for patient.   CSW will sign off for now as social work intervention is no longer needed. Please consult [place] again if new needs arise.  Cedric Fishman, LCSW Clinical Social Worker [phone]    Final next level of care: [place] Barriers to Discharge: No Barriers Identified   Patient Goals and CMS Choice Patient states their goals for this hospitalization and ongoing recovery are:: Pt would like to go to a hospice facility   Choice offered to / list presented to : NA  Discharge Placement                Patient to be transferred to facility by: [place] Name of family member notified: Son Patient and family notified of of transfer: 10/10/18  Discharge Plan and Services In-house Referral: Clinical Social Work Discharge Planning Services: NA Post Acute Care Choice: Hospice          DME Arranged: N/A DME Agency: NA       HH Arranged: NA [place] Agency: NA        Social Determinants of Health (SDOH) Interventions     Readmission Risk Interventions Readmission Risk Prevention Plan 10/09/2018 10/04/2018  Transportation Screening Complete Complete  PCP or Specialist Appt within 5-7 Days - Complete  PCP or Specialist Appt within 3-5 Days Not Complete -  Not Complete comments Will need to have appointment scheduled after hospitalization -  Home Care  Screening - Complete  Medication Review (RN CM) - Complete  HRI or Home Care Consult Complete -  Social Work Consult for [place] Planning/Counseling Complete -  Palliative Care Screening Not Complete -  Palliative Care Screening Not Complete Comments Palliative consult pending -  Medication Review (RN Care Manager) Complete -  Some recent data might be hidden

## 2018-10-10 NOTE — Progress Notes (Addendum)
[place] 3W-16 [facility] ([facility]) RN note for [employer] @ [place], pt's son and [place], Secretary/administrator Place care for his mother. Chart reviewed and eligibility approved. Received report from bedside RN, [place]. Spoke with son, Jeneen Rinks, by phone explaining services. Family agreeable to transfer today. Jules Husbands, LCSW aware. Registration paper work completed. Dr. Orpah Melter to assume care per family request.   Please fax discharge summary to [phone].  RN please call report to [phone].  Please arrange transport for patient to arrive as early in day as possible.  Please call with any questions or concerns.  Thank you, Margaretmary Eddy, RN, BSN [place] [phone]  [place] are on [place]

## 2018-10-10 NOTE — Progress Notes (Signed)
Family in to see patient for Mother's Day.  Patient in relatively good spirits and anxious to be transferred to [facility] today or tomorrow.  Family very appreciative of staff and support given during this difficult transitionary period.  Pain under good control with po Morphine.  She most often complains of pain in right arm, where she recently underwent surgical repair of humerus.  Adjunctive pain management includes ice to area with extremity elevation and emotional support.  Extended family members able to speak with patient on phone.

## 2018-10-10 NOTE — Discharge Summary (Signed)
Physician Discharge Summary  DALI KRANER MWN:[id number] DOB: Oct 02, 1945 DOA: 10/08/2018  PCP: Elby Showers, MD  Admit date: 10/08/2018 Discharge date: 10/10/2018  Admitted From: Home Disposition: Residential hospice  Recommendations for Outpatient Follow-up:  1. Follow up with residential hospice at earliest [place]: No Equipment/Devices: Foley catheter for comfort  Discharge Condition: Poor CODE STATUS: DNR Diet recommendation: As per comfort measures  Brief/Interim Summary: 73 year old female with history of recent fall with intracranial hemorrhage/subdural hematoma with right humeral and ulnar fractures, depression, hyperlipidemia, COPD, breast cancer, lung cancer with metastases for which she has refused any diagnostics or treatment and chronic hypoxia who was discharged from the hospital on 10/02/2020 follow-up with orthopedics and oncologist presented on 10/08/2018 with worsening weakness.  CT of the head showed possible new subarachnoid hemorrhage along with increased size of right subdural hematoma with increased mass-effect with midline shift.  Neurosurgery was consulted.  Neurosurgery recommended bur hole drainage but patient refused surgical intervention and after palliative care discussion, she has agreed for residential hospice.  Overall prognosis is very poor.  She will be discharged to residential hospice once bed is available.   Discharge Diagnoses:  Principal Problem:   SAH (subarachnoid hemorrhage) (HCC) Active Problems:   Hyperlipidemia   Schizoaffective disorder (HCC)   COPD exacerbation (HCC)   Cancer of lower-inner quadrant of left female breast ([place])   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 ([place])   DNR (do not resuscitate)   Chronic respiratory failure with hypoxia ([place])   Subdural hematoma (HCC)   Closed displaced fracture of greater tuberosity of right humerus  Worsening right subdural hematoma with mass-effect and midline  shift New subarachnoid hemorrhage -Neurosurgery recommended burr hole drainage. -patient refused surgical intervention and after palliative care discussion, she has agreed for residential hospice.  Overall prognosis is very poor.  She will be discharged to residential hospice once bed is available. -Oral Keppra has been started for seizure prophylaxis which will probably provide comfort.  Use Ativan as needed  Multiple humeral fractures/distal radius and ulnar styloid fracture -Right arm is immobilized. -Was recently evaluated and treated by orthopedics: Nonoperative management with nonweightbearing and sling recommended for right proximal humerus fracture -Underwent ORIF on 09/30/2018 for right distal humerus and distal radius fractures and subsequent nonweightbearing, sling and compressive dressings -No further follow-up as patient will be going to residential hospice  Metastatic lung cancer with chronic hypoxia History of breast cancer -Plan as above  Schizoaffective disorder -Continue citalopram and olanzapine  Leukocytosis -Probably reactive  Thrombocytopenia -No further work-up  Discharge Instructions   Allergies as of 10/10/2018   No Known Allergies     Medication List    STOP taking these medications   anastrozole 1 MG tablet Commonly known as:  ARIMIDEX   arformoterol 15 MCG/2ML Nebu Commonly known as:  BROVANA   aspirin 81 MG tablet   atorvastatin 40 MG tablet Commonly known as:  LIPITOR   Fish Oil 1200 MG Caps   magnesium oxide 400 MG tablet Commonly known as:  MAG-OX   MULTIVITAMIN GUMMIES ADULT PO   oxyCODONE-acetaminophen 5-325 MG tablet Commonly known as:  PERCOCET/ROXICET   PROBIOTIC DAILY PO   umeclidinium-vilanterol 62.5-25 MCG/INH Aepb Commonly known as:  Anoro Ellipta   Vitamin D 50 MCG (2000 UT) Caps     TAKE these medications   acetaminophen 500 MG tablet Commonly known as:  TYLENOL Take 2 tablets (1,000 mg total) by mouth 3  (three) times daily.  albuterol 108 (90 Base) MCG/ACT inhaler Commonly known as:  VENTOLIN HFA Inhale 2 puffs into the lungs every 6 (six) hours as needed for wheezing or shortness of breath.   albuterol (2.5 MG/3ML) 0.083% nebulizer solution Commonly known as:  PROVENTIL Take 3 mLs (2.5 mg total) by nebulization every 6 (six) hours as needed for wheezing or shortness of breath.   cholestyramine 4 g packet Commonly known as:  QUESTRAN TAKE 1 PACKET (4 G TOTAL) BY MOUTH 2 (TWO) TIMES DAILY.   citalopram 20 MG tablet Commonly known as:  CELEXA Take 20 mg by mouth daily.   diltiazem 120 MG 24 hr capsule Commonly known as:  CARDIZEM CD Take 1 capsule (120 mg total) by mouth daily.   levETIRAcetam 500 MG tablet Commonly known as:  KEPPRA Take 1 tablet (500 mg total) by mouth 2 (two) times daily.   loratadine 10 MG tablet Commonly known as:  CLARITIN Take 10 mg by mouth daily.   LORazepam 0.5 MG tablet Commonly known as:  Ativan Take 1-2 tablets (0.5-1 mg total) by mouth every 8 (eight) hours as needed for anxiety or seizure.   morphine CONCENTRATE 10 MG/0.5ML Soln concentrated solution Take 0.5 mLs (10 mg total) by mouth every 2 (two) hours as needed for moderate pain, severe pain, anxiety or shortness of breath.   OLANZapine 2.5 MG tablet Commonly known as:  ZYPREXA Take 1 tablet (2.5 mg total) by mouth daily.   ondansetron 4 MG tablet Commonly known as:  ZOFRAN Take 1 tablet (4 mg total) by mouth every 6 (six) hours as needed for nausea.   senna 8.6 MG Tabs tablet Commonly known as:  SENOKOT Take 1 tablet (8.6 mg total) by mouth daily as needed for mild constipation.      Follow-up Information    Residential hospice Follow up.   Why:  At earliest convenience         No Known Allergies  Consultations:  Neurosurgery/palliative care   Procedures/Studies: Dg Chest 2 View  Result Date: 09/26/2018 CLINICAL DATA:  Shortness of breath EXAM: CHEST - 2 VIEW  COMPARISON:  09/03/2017, PET CT 11/24/2016 FINDINGS: Masslike opacity and architectural distortion in the right hilar region, slightly more opaque. Volume loss in the right hemithorax consistent with post treatment change. No pleural effusion. Stable cardiomediastinal silhouette with aortic atherosclerosis. No pneumothorax. Subtle fracture proximal right humerus. Age indeterminate right seventh rib fracture. IMPRESSION: 1. Negative for pneumothorax. 2. Masslike opacity with architectural distortion in the right hilar region, possibly due to post treatment change however this region appears slightly more opaque compared to prior and recurrent mass is possible. 3. Subtle fracture involving the proximal right humerus. 4. Age indeterminate right seventh rib fracture Electronically Signed   By: Donavan Foil M.D.   On: 09/26/2018 21:54   Dg Pelvis 1-2 Views  Result Date: 09/26/2018 CLINICAL DATA:  Fall EXAM: PELVIS - 1-2 VIEW COMPARISON:  None. FINDINGS: SI joints are intact. Pubic symphysis and rami show no fracture. No fracture or dislocation is evident. IMPRESSION: No acute osseous abnormality Electronically Signed   By: Donavan Foil M.D.   On: 09/26/2018 21:44   Dg Shoulder Right  Result Date: 09/30/2018 CLINICAL DATA:  Status post right proximal humeral fracture EXAM: RIGHT SHOULDER - 2+ VIEW COMPARISON:  09/26/2018 FINDINGS: Redemonstrated is a minimally displaced fracture of the right greater tuberosity. Generalized osteopenia. No other fracture or dislocation. Mild arthropathy of the acromioclavicular joint. IMPRESSION: Redemonstrated is a minimally displaced fracture of the  right greater tuberosity. Electronically Signed   By: Kathreen Devoid   On: 09/30/2018 12:57   Dg Shoulder Right  Result Date: 09/26/2018 CLINICAL DATA:  Fall EXAM: RIGHT SHOULDER - 2+ VIEW COMPARISON:  None. FINDINGS: Acute mildly comminuted fracture at the greater tuberosity of the humerus. Suspected faint transverse lucency  through the neck of the humerus. No dislocation. Mild AC joint degenerative change. Architectural distortion at the right hilus IMPRESSION: 1. Acute comminuted fracture involving the greater tuberosity of the humerus with suspected transverse fracture lucency at the right humeral neck. Electronically Signed   By: Donavan Foil M.D.   On: 09/26/2018 21:43   Dg Elbow Complete Right  Result Date: 09/26/2018 CLINICAL DATA:  Fall EXAM: RIGHT ELBOW - COMPLETE 3+ VIEW COMPARISON:  None. FINDINGS: Acute, mildly comminuted fracture involving the distal humerus above the level of the condyles. About 1/3 bone with ulnar displacement of distal fracture fragment. Associated large elbow effusion. Radial head alignment is within normal limits. IMPRESSION: Acute, mildly comminuted and displaced distal humerus fracture with associated elbow effusion Electronically Signed   By: Donavan Foil M.D.   On: 09/26/2018 21:48   Dg Forearm Right  Result Date: 09/26/2018 CLINICAL DATA:  Fall EXAM: RIGHT FOREARM - 2 VIEW COMPARISON:  None. FINDINGS: Acute minimally displaced fracture at the ulnar styloid process. Acute impacted intra-articular distal radius fracture with mild dorsal displacement. Incompletely visualized distal humerus fracture. IMPRESSION: 1. Acute impacted intra-articular distal radius fracture. Acute ulnar styloid process fracture 2. Incompletely visualized displaced distal humerus fracture Electronically Signed   By: Donavan Foil M.D.   On: 09/26/2018 21:47   Ct Head Wo Contrast  Result Date: 10/08/2018 CLINICAL DATA:  Weakness. Altered level of consciousness. EXAM: CT HEAD WITHOUT CONTRAST TECHNIQUE: Contiguous axial images were obtained from the base of the skull through the vertex without intravenous contrast. COMPARISON:  09/26/2018 FINDINGS: Brain: Subdural hematoma over the right cerebral convexity has enlarged and now measures up to 16 mm in thickness in the frontal region, however the dominant portion of  the hematoma is now hypoattenuating with a small amount of more hyperattenuating blood products posteriorly. There is new small volume acute subarachnoid hemorrhage primarily in right parieto-occipital sulci. A small residual left-sided subdural hematoma is lower in density than that on the prior study and measures up to 5 mm in thickness in the parietal region. Intracranial mass effect has increased with partial effacement of the right greater than left lateral ventricles and 12 mm of leftward midline shift (previously 1-2 mm). No acute large territory infarct is identified. Cerebral white matter hypodensities are similar to the prior study and nonspecific but compatible with mild chronic small vessel ischemic disease. Vascular: Calcified atherosclerosis at the skull base. Skull: No fracture or focal osseous lesion. Sinuses/Orbits: Visualized paranasal sinuses and mastoid air cells are clear. Bilateral cataract extraction is noted. Other: Small residual right periorbital hematoma, decreased from prior. IMPRESSION: 1. Increased size of right-sided subdural hematoma with increased mass effect including 12 mm of leftward midline shift. 2. New small volume acute subarachnoid hemorrhage. 3. Persistent small left-sided subdural hematoma. Critical Value/emergent results were called by telephone at the time of interpretation on 10/08/2018 at 5:30 pm to Dr. Deno Etienne , who verbally acknowledged these results. Electronically Signed   By: Logan Bores M.D.   On: 10/08/2018 17:32   Ct Head Wo Contrast  Result Date: 09/26/2018 CLINICAL DATA:  Initial evaluation for acute trauma, fall. EXAM: CT HEAD WITHOUT CONTRAST CT MAXILLOFACIAL WITHOUT  CONTRAST CT CERVICAL SPINE WITHOUT CONTRAST TECHNIQUE: Multidetector CT imaging of the head, cervical spine, and maxillofacial structures were performed using the standard protocol without intravenous contrast. Multiplanar CT image reconstructions of the cervical spine and maxillofacial  structures were also generated. COMPARISON:  None. FINDINGS: CT HEAD FINDINGS Brain: Generalized age-related cerebral atrophy with mild chronic small vessel ischemic disease. Acute subdural hematoma overlies the right cerebral convexity, measuring up to 6 mm in maximal thickness. Additional smaller subdural hemorrhage seen overlying the left parietal convexity, measuring up to 5 mm in maximal diameter. Associated trace 1-2 mm right-to-left shift. No hydrocephalus or ventricular trapping. No other acute intracranial hemorrhage. No acute large vessel territory infarct. No mass lesion. Vascular: No hyperdense vessel. Scattered vascular calcifications noted within the carotid siphons. Skull: Soft tissue contusion at the right periorbital region. Calvarium intact. Other: Trace left mastoid effusion noted, of doubtful significance. CT MAXILLOFACIAL FINDINGS Osseous: A zygomatic arches are intact. No acute maxillary fracture. Pterygoid plates intact. No acute nasal bone fracture. Mild left-to-right nasal septal deviation without associated fracture. Mandible intact. Mandibular condyles normally situated. No acute abnormality about the dentition. Orbits: Globes intact. Normal retro-orbital hematoma or other pathology. Bony orbits intact without fracture. Sinuses: Paranasal sinuses are clear.  No hemosinus. Soft tissues: Large right periorbital/facial contusion. CT CERVICAL SPINE FINDINGS Alignment: Straightening of the normal cervical lordosis. No listhesis. Skull base and vertebrae: Skull base intact. Normal C1-2 articulations are preserved in the dens is intact. Vertebral body heights maintained. No acute fracture. Soft tissues and spinal canal: Soft tissues of the neck demonstrate no acute finding. No abnormal prevertebral edema. Spinal canal within normal limits. Disc levels: Mild right-sided facet arthrosis at C2-3 and C3-4. No significant spinal stenosis within the cervical spine. Upper chest: Visualized upper chest  demonstrates no acute finding. Upper lobe predominant centrilobular emphysema noted. Other: None. IMPRESSION: CT HEAD: 1. Acute bilateral subdural hematomas, right larger than left as above. Associated trace 1-2 mm right-to-left midline shift. 2. No other acute intracranial abnormality. 3. Mild age-related cerebral atrophy with chronic small vessel ischemic disease. CT MAXILLOFACIAL: 1. Large right periorbital/facial soft tissue contusion. 2. No other acute maxillofacial injury. No fracture. Intact globes with no retro-orbital pathology. CT CERVICAL SPINE: No acute traumatic injury within the cervical spine. Critical Value/emergent results were called by telephone at the time of interpretation on 09/26/2018 at 10:50 pm to Dr. Shelby Dubin , who verbally acknowledged these results. Electronically Signed   By: Jeannine Boga M.D.   On: 09/26/2018 22:52   Ct Cervical Spine Wo Contrast  Result Date: 09/26/2018 CLINICAL DATA:  Initial evaluation for acute trauma, fall. EXAM: CT HEAD WITHOUT CONTRAST CT MAXILLOFACIAL WITHOUT CONTRAST CT CERVICAL SPINE WITHOUT CONTRAST TECHNIQUE: Multidetector CT imaging of the head, cervical spine, and maxillofacial structures were performed using the standard protocol without intravenous contrast. Multiplanar CT image reconstructions of the cervical spine and maxillofacial structures were also generated. COMPARISON:  None. FINDINGS: CT HEAD FINDINGS Brain: Generalized age-related cerebral atrophy with mild chronic small vessel ischemic disease. Acute subdural hematoma overlies the right cerebral convexity, measuring up to 6 mm in maximal thickness. Additional smaller subdural hemorrhage seen overlying the left parietal convexity, measuring up to 5 mm in maximal diameter. Associated trace 1-2 mm right-to-left shift. No hydrocephalus or ventricular trapping. No other acute intracranial hemorrhage. No acute large vessel territory infarct. No mass lesion. Vascular: No hyperdense  vessel. Scattered vascular calcifications noted within the carotid siphons. Skull: Soft tissue contusion at the right periorbital region. Calvarium  intact. Other: Trace left mastoid effusion noted, of doubtful significance. CT MAXILLOFACIAL FINDINGS Osseous: A zygomatic arches are intact. No acute maxillary fracture. Pterygoid plates intact. No acute nasal bone fracture. Mild left-to-right nasal septal deviation without associated fracture. Mandible intact. Mandibular condyles normally situated. No acute abnormality about the dentition. Orbits: Globes intact. Normal retro-orbital hematoma or other pathology. Bony orbits intact without fracture. Sinuses: Paranasal sinuses are clear.  No hemosinus. Soft tissues: Large right periorbital/facial contusion. CT CERVICAL SPINE FINDINGS Alignment: Straightening of the normal cervical lordosis. No listhesis. Skull base and vertebrae: Skull base intact. Normal C1-2 articulations are preserved in the dens is intact. Vertebral body heights maintained. No acute fracture. Soft tissues and spinal canal: Soft tissues of the neck demonstrate no acute finding. No abnormal prevertebral edema. Spinal canal within normal limits. Disc levels: Mild right-sided facet arthrosis at C2-3 and C3-4. No significant spinal stenosis within the cervical spine. Upper chest: Visualized upper chest demonstrates no acute finding. Upper lobe predominant centrilobular emphysema noted. Other: None. IMPRESSION: CT HEAD: 1. Acute bilateral subdural hematomas, right larger than left as above. Associated trace 1-2 mm right-to-left midline shift. 2. No other acute intracranial abnormality. 3. Mild age-related cerebral atrophy with chronic small vessel ischemic disease. CT MAXILLOFACIAL: 1. Large right periorbital/facial soft tissue contusion. 2. No other acute maxillofacial injury. No fracture. Intact globes with no retro-orbital pathology. CT CERVICAL SPINE: No acute traumatic injury within the cervical spine.  Critical Value/emergent results were called by telephone at the time of interpretation on 09/26/2018 at 10:50 pm to Dr. Shelby Dubin , who verbally acknowledged these results. Electronically Signed   By: Jeannine Boga M.D.   On: 09/26/2018 22:52   Dg Chest Port 1 View  Result Date: 10/08/2018 CLINICAL DATA:  Shortness of breath. History of lung cancer and breast cancer. EXAM: PORTABLE CHEST 1 VIEW COMPARISON:  09/26/2018 FINDINGS: The cardiomediastinal silhouette is unchanged with normal heart size. Volume loss is again seen in the right hemithorax. Density extending from the right hilum to the lateral right midlung with regional architectural distortion is unchanged. No new airspace opacity, edema, pleural effusion, or pneumothorax is identified. Surgical clips project over the left breast. Proximal right humerus and right seventh rib fractures are again noted. IMPRESSION: Unchanged appearance of the chest including chronic post treatment changes in the right lung. No evidence of acute airspace disease. Electronically Signed   By: Logan Bores M.D.   On: 10/08/2018 16:32   Dg Humerus Right  Result Date: 09/30/2018 CLINICAL DATA:  Elbow fracture. EXAM: DG C-ARM 61-120 MIN; RIGHT HUMERUS - 2+ VIEW FLUOROSCOPY TIME:  20 seconds. COMPARISON:  Radiographs of September 26, 2018. FINDINGS: Two intraoperative fluoroscopic images were obtained of the right elbow. These images demonstrate surgical internal fixation of the distal right humeral fracture with overlying expected postoperative changes in the soft tissues. Improved alignment of fracture components is noted. IMPRESSION: Status post open reduction and internal fixation of distal right humeral fracture. Good alignment of fracture components is noted. Electronically Signed   By: Marijo Conception M.D.   On: 09/30/2018 09:55   Dg Hand Complete Right  Result Date: 09/26/2018 CLINICAL DATA:  Fall EXAM: RIGHT HAND - COMPLETE 3+ VIEW COMPARISON:  None.  FINDINGS: Bones appear osteopenic. Mild arthritis at the first [facility] joint. Degenerative changes at the DIP and PIP joints. Acute nondisplaced fracture ulnar styloid process. Acute, mildly comminuted and impacted intra-articular fracture at the distal radius. IMPRESSION: 1. Acute comminuted impacted intra-articular fracture of the  distal radius 2. Acute nondisplaced ulnar styloid process fracture Electronically Signed   By: Donavan Foil M.D.   On: 09/26/2018 21:46   Dg C-arm 1-60 Min  Result Date: 09/30/2018 CLINICAL DATA:  Elbow fracture. EXAM: DG C-ARM 61-120 MIN; RIGHT HUMERUS - 2+ VIEW FLUOROSCOPY TIME:  20 seconds. COMPARISON:  Radiographs of September 26, 2018. FINDINGS: Two intraoperative fluoroscopic images were obtained of the right elbow. These images demonstrate surgical internal fixation of the distal right humeral fracture with overlying expected postoperative changes in the soft tissues. Improved alignment of fracture components is noted. IMPRESSION: Status post open reduction and internal fixation of distal right humeral fracture. Good alignment of fracture components is noted. Electronically Signed   By: Marijo Conception M.D.   On: 09/30/2018 09:55   Ct Maxillofacial Wo Contrast  Result Date: 09/26/2018 CLINICAL DATA:  Initial evaluation for acute trauma, fall. EXAM: CT HEAD WITHOUT CONTRAST CT MAXILLOFACIAL WITHOUT CONTRAST CT CERVICAL SPINE WITHOUT CONTRAST TECHNIQUE: Multidetector CT imaging of the head, cervical spine, and maxillofacial structures were performed using the standard protocol without intravenous contrast. Multiplanar CT image reconstructions of the cervical spine and maxillofacial structures were also generated. COMPARISON:  None. FINDINGS: CT HEAD FINDINGS Brain: Generalized age-related cerebral atrophy with mild chronic small vessel ischemic disease. Acute subdural hematoma overlies the right cerebral convexity, measuring up to 6 mm in maximal thickness. Additional smaller  subdural hemorrhage seen overlying the left parietal convexity, measuring up to 5 mm in maximal diameter. Associated trace 1-2 mm right-to-left shift. No hydrocephalus or ventricular trapping. No other acute intracranial hemorrhage. No acute large vessel territory infarct. No mass lesion. Vascular: No hyperdense vessel. Scattered vascular calcifications noted within the carotid siphons. Skull: Soft tissue contusion at the right periorbital region. Calvarium intact. Other: Trace left mastoid effusion noted, of doubtful significance. CT MAXILLOFACIAL FINDINGS Osseous: A zygomatic arches are intact. No acute maxillary fracture. Pterygoid plates intact. No acute nasal bone fracture. Mild left-to-right nasal septal deviation without associated fracture. Mandible intact. Mandibular condyles normally situated. No acute abnormality about the dentition. Orbits: Globes intact. Normal retro-orbital hematoma or other pathology. Bony orbits intact without fracture. Sinuses: Paranasal sinuses are clear.  No hemosinus. Soft tissues: Large right periorbital/facial contusion. CT CERVICAL SPINE FINDINGS Alignment: Straightening of the normal cervical lordosis. No listhesis. Skull base and vertebrae: Skull base intact. Normal C1-2 articulations are preserved in the dens is intact. Vertebral body heights maintained. No acute fracture. Soft tissues and spinal canal: Soft tissues of the neck demonstrate no acute finding. No abnormal prevertebral edema. Spinal canal within normal limits. Disc levels: Mild right-sided facet arthrosis at C2-3 and C3-4. No significant spinal stenosis within the cervical spine. Upper chest: Visualized upper chest demonstrates no acute finding. Upper lobe predominant centrilobular emphysema noted. Other: None. IMPRESSION: CT HEAD: 1. Acute bilateral subdural hematomas, right larger than left as above. Associated trace 1-2 mm right-to-left midline shift. 2. No other acute intracranial abnormality. 3. Mild  age-related cerebral atrophy with chronic small vessel ischemic disease. CT MAXILLOFACIAL: 1. Large right periorbital/facial soft tissue contusion. 2. No other acute maxillofacial injury. No fracture. Intact globes with no retro-orbital pathology. CT CERVICAL SPINE: No acute traumatic injury within the cervical spine. Critical Value/emergent results were called by telephone at the time of interpretation on 09/26/2018 at 10:50 pm to Dr. Shelby Dubin , who verbally acknowledged these results. Electronically Signed   By: Jeannine Boga M.D.   On: 09/26/2018 22:52   US Abdomen Limited Ruq  Result Date:  09/29/2018 CLINICAL DATA:  Abnormal liver function tests. EXAM: ULTRASOUND ABDOMEN LIMITED RIGHT UPPER QUADRANT COMPARISON:  None. FINDINGS: Gallbladder: Status post cholecystectomy. Common bile duct: Diameter: 5 mm which is within normal limits. Liver: No focal lesion identified. Within normal limits in parenchymal echogenicity. Portal vein is patent on color Doppler imaging with normal direction of blood flow towards the liver. IMPRESSION: Status post cholecystectomy. No other abnormality seen in the right upper quadrant of the abdomen. Electronically Signed   By: Marijo Conception M.D.   On: 09/29/2018 15:58       Subjective: Patient seen and examined at bedside.  She is awake and answers some questions but is confused. Discharge Exam: Vitals:   10/10/18 0021 10/10/18 0818  BP: 101/61 117/71  Pulse: 64 94  Resp: 20 (!) 22  Temp: 97.9 F (36.6 C) 97.8 F (36.6 C)  SpO2: 99% 99%    General: Pt is awake, not in any distress.  Confused slightly.  Right periorbital ecchymosis present.  Looks very deconditioned and chronically ill. Cardiovascular: rate controlled, S1/S2 + Respiratory: bilateral decreased breath sounds at bases with scattered crackles Abdominal: Soft, NT, ND, bowel sounds + Extremities: Right upper extremity is mobilized, no cyanosis.  Trace edema    The results of  significant diagnostics from this hospitalization (including imaging, microbiology, ancillary and laboratory) are listed below for reference.     Microbiology: Recent Results (from the past 240 hour(s))  SARS Coronavirus 2 (CEPHEID - Performed in [place] hospital lab), Hosp Order     Status: None   Collection Time: 10/08/18  3:55 PM  Result Value Ref Range Status   SARS Coronavirus 2 NEGATIVE NEGATIVE Final    Comment: (NOTE) If result is NEGATIVE SARS-CoV-2 target nucleic acids are NOT DETECTED. The SARS-CoV-2 RNA is generally detectable in upper and lower  respiratory specimens during the acute phase of infection. The lowest  concentration of SARS-CoV-2 viral copies this assay can detect is 250  copies / mL. A negative result does not preclude SARS-CoV-2 infection  and should not be used as the sole basis for treatment or other  patient management decisions.  A negative result may occur with  improper specimen collection / handling, submission of specimen other  than nasopharyngeal swab, presence of viral mutation(s) within the  areas targeted by this assay, and inadequate number of viral copies  (<250 copies / mL). A negative result must be combined with clinical  observations, patient history, and epidemiological information. If result is POSITIVE SARS-CoV-2 target nucleic acids are DETECTED. The SARS-CoV-2 RNA is generally detectable in upper and lower  respiratory specimens dur ing the acute phase of infection.  Positive  results are indicative of active infection with SARS-CoV-2.  Clinical  correlation with patient history and other diagnostic information is  necessary to determine patient infection status.  Positive results do  not rule out bacterial infection or co-infection with other viruses. If result is PRESUMPTIVE POSTIVE SARS-CoV-2 nucleic acids MAY BE PRESENT.   A presumptive positive result was obtained on the submitted specimen  and confirmed on repeat  testing.  While 2019 novel coronavirus  (SARS-CoV-2) nucleic acids may be present in the submitted sample  additional confirmatory testing may be necessary for epidemiological  and / or clinical management purposes  to differentiate between  SARS-CoV-2 and other Sarbecovirus currently known to infect humans.  If clinically indicated additional testing with an alternate test  methodology [phone]) is advised. The SARS-CoV-2 RNA is generally  detectable  in upper and lower respiratory sp ecimens during the acute  phase of infection. The expected result is Negative. Fact Sheet for Patients:  StrictlyIdeas.no Fact Sheet for Healthcare Providers: BankingDealers.co.za This test is not yet approved or cleared by the [place] FDA and has been authorized for detection and/or diagnosis of SARS-CoV-2 by FDA under an Emergency Use Authorization (EUA).  This EUA will remain in effect (meaning this test can be used) for the duration of the COVID-19 declaration under Section 564(b)(1) of the Act, 21 U.S.C. section 360bbb-3(b)(1), unless the authorization is terminated or revoked sooner. Performed at [facility], [place] [address], [place], [place] [address]   Urine culture     Status: None   Collection Time: 10/09/18  5:50 AM  Result Value Ref Range Status   Specimen Description URINE, CATHETERIZED  Final   Special Requests NONE  Final   Culture   Final    NO GROWTH Performed at [facility], [address] [place],  [address]    Report Status 10/10/2018 FINAL  Final     Labs: BNP (last 3 results) No results for input(s): BNP in the last 8760 hours. Basic Metabolic Panel: Recent Labs  Lab 10/08/18 1537 10/08/18 1606 10/09/18 0428  NA 140 136 141  K 3.7 3.7 3.4*  CL 95*  --  98  CO2 34*  --  33*  GLUCOSE 128*  --  140*  BUN 9  --  5*  CREATININE 0.53 0.50 0.36*  CALCIUM 9.2  --  8.7*   Liver Function  Tests: Recent Labs  Lab 10/08/18 1537 10/09/18 0428  AST 18 15  ALT 13 11  ALKPHOS 87 79  BILITOT 1.3* 0.9  PROT 7.2 6.5  ALBUMIN 3.4* 3.0*   No results for input(s): LIPASE, AMYLASE in the last 168 hours. Recent Labs  Lab 10/08/18 1537  AMMONIA 46*   CBC: Recent Labs  Lab 10/08/18 1537 10/08/18 1606 10/09/18 0428  WBC 17.2*  --  14.1*  NEUTROABS 13.1*  --   --   HGB 11.6* 11.9* 10.1*  HCT 36.4 35.0* 31.1*  MCV 90.3  --  89.4  PLT 66*  --  59*   Cardiac Enzymes: No results for input(s): CKTOTAL, CKMB, CKMBINDEX, TROPONINI in the last 168 hours. BNP: Invalid input(s): POCBNP CBG: Recent Labs  Lab 10/08/18 1554  GLUCAP 125*   D-Dimer No results for input(s): DDIMER in the last 72 hours. Hgb A1c No results for input(s): HGBA1C in the last 72 hours. Lipid Profile No results for input(s): CHOL, HDL, LDLCALC, TRIG, CHOLHDL, LDLDIRECT in the last 72 hours. Thyroid function studies No results for input(s): TSH, T4TOTAL, T3FREE, THYROIDAB in the last 72 hours.  Invalid input(s): FREET3 Anemia work up No results for input(s): VITAMINB12, FOLATE, FERRITIN, TIBC, IRON, RETICCTPCT in the last 72 hours. Urinalysis    Component Value Date/Time   COLORURINE YELLOW 10/09/2018 0555   APPEARANCEUR CLOUDY (A) 10/09/2018 0555   LABSPEC 1.016 10/09/2018 0555   PHURINE 7.0 10/09/2018 0555   GLUCOSEU NEGATIVE 10/09/2018 0555   HGBUR SMALL (A) 10/09/2018 0555   BILIRUBINUR NEGATIVE 10/09/2018 0555   BILIRUBINUR NEG 04/27/2018 1016   KETONESUR NEGATIVE 10/09/2018 0555   PROTEINUR 30 (A) 10/09/2018 0555   UROBILINOGEN 0.2 04/27/2018 1016   NITRITE NEGATIVE 10/09/2018 0555   LEUKOCYTESUR NEGATIVE 10/09/2018 0555   Sepsis Labs Invalid input(s): PROCALCITONIN,  WBC,  LACTICIDVEN Microbiology Recent Results (from the past 240 hour(s))  SARS  Coronavirus 2 (CEPHEID - Performed in [place] hospital lab), Hosp Order     Status: None   Collection Time: 10/08/18  3:55 PM   Result Value Ref Range Status   SARS Coronavirus 2 NEGATIVE NEGATIVE Final    Comment: (NOTE) If result is NEGATIVE SARS-CoV-2 target nucleic acids are NOT DETECTED. The SARS-CoV-2 RNA is generally detectable in upper and lower  respiratory specimens during the acute phase of infection. The lowest  concentration of SARS-CoV-2 viral copies this assay can detect is 250  copies / mL. A negative result does not preclude SARS-CoV-2 infection  and should not be used as the sole basis for treatment or other  patient management decisions.  A negative result may occur with  improper specimen collection / handling, submission of specimen other  than nasopharyngeal swab, presence of viral mutation(s) within the  areas targeted by this assay, and inadequate number of viral copies  (<250 copies / mL). A negative result must be combined with clinical  observations, patient history, and epidemiological information. If result is POSITIVE SARS-CoV-2 target nucleic acids are DETECTED. The SARS-CoV-2 RNA is generally detectable in upper and lower  respiratory specimens dur ing the acute phase of infection.  Positive  results are indicative of active infection with SARS-CoV-2.  Clinical  correlation with patient history and other diagnostic information is  necessary to determine patient infection status.  Positive results do  not rule out bacterial infection or co-infection with other viruses. If result is PRESUMPTIVE POSTIVE SARS-CoV-2 nucleic acids MAY BE PRESENT.   A presumptive positive result was obtained on the submitted specimen  and confirmed on repeat testing.  While 2019 novel coronavirus  (SARS-CoV-2) nucleic acids may be present in the submitted sample  additional confirmatory testing may be necessary for epidemiological  and / or clinical management purposes  to differentiate between  SARS-CoV-2 and other Sarbecovirus currently known to infect humans.  If clinically indicated additional  testing with an alternate test  methodology [phone]) is advised. The SARS-CoV-2 RNA is generally  detectable in upper and lower respiratory sp ecimens during the acute  phase of infection. The expected result is Negative. Fact Sheet for Patients:  StrictlyIdeas.no Fact Sheet for Healthcare Providers: BankingDealers.co.za This test is not yet approved or cleared by the [place] FDA and has been authorized for detection and/or diagnosis of SARS-CoV-2 by FDA under an Emergency Use Authorization (EUA).  This EUA will remain in effect (meaning this test can be used) for the duration of the COVID-19 declaration under Section 564(b)(1) of the Act, 21 U.S.C. section 360bbb-3(b)(1), unless the authorization is terminated or revoked sooner. Performed at [facility], [place] [address], [place], [place] [address]   Urine culture     Status: None   Collection Time: 10/09/18  5:50 AM  Result Value Ref Range Status   Specimen Description URINE, CATHETERIZED  Final   Special Requests NONE  Final   Culture   Final    NO GROWTH Performed at [facility], [address], [place], [place] [address]    Report Status 10/10/2018 FINAL  Final     Time coordinating discharge: 35 minutes  SIGNED:   Aline August, MD  [facility] 10/10/2018, 10:20 AM

## 2018-10-10 NOTE — Progress Notes (Signed)
Report called to [facility].  Patient's watch found from admission on 4N and placed on patient's left wrist.  PTAR here to pick up patient.  Patient transported to [facility] via [place] and accompanied by 2 attendants.  Discharge paperwork given to attendants and reviewed with nurse via phone report.  Patient's son informed of patient's discharge.

## 2018-10-10 NOTE — Progress Notes (Signed)
Family at bedside with patient.  Patient not responsive at this time.  Family asking appropriate questions.  Family requested that they be called with any changes in condition.

## 2018-10-11 ENCOUNTER — Telehealth: Payer: Self-pay

## 2018-10-11 NOTE — Telephone Encounter (Signed)
Nurse placed call to [facility].  Update given that upon home health visit on 10/08/2018 patient was admitted to hospital.   Discharged to [facility].

## 2018-10-15 ENCOUNTER — Ambulatory Visit (INDEPENDENT_AMBULATORY_CARE_PROVIDER_SITE_OTHER): Payer: PPO | Admitting: Internal Medicine

## 2018-10-15 ENCOUNTER — Telehealth: Payer: Self-pay | Admitting: Internal Medicine

## 2018-10-15 DIAGNOSIS — S065XAA Traumatic subdural hemorrhage with loss of consciousness status unknown, initial encounter: Secondary | ICD-10-CM

## 2018-10-15 DIAGNOSIS — D696 Thrombocytopenia, unspecified: Secondary | ICD-10-CM | POA: Diagnosis not present

## 2018-10-15 DIAGNOSIS — S065X9A Traumatic subdural hemorrhage with loss of consciousness of unspecified duration, initial encounter: Secondary | ICD-10-CM

## 2018-10-15 DIAGNOSIS — E869 Volume depletion, unspecified: Secondary | ICD-10-CM | POA: Diagnosis not present

## 2018-10-15 DIAGNOSIS — S42251A Displaced fracture of greater tuberosity of right humerus, initial encounter for closed fracture: Secondary | ICD-10-CM | POA: Diagnosis not present

## 2018-10-15 DIAGNOSIS — D0512 Intraductal carcinoma in situ of left breast: Secondary | ICD-10-CM

## 2018-10-15 DIAGNOSIS — J449 Chronic obstructive pulmonary disease, unspecified: Secondary | ICD-10-CM | POA: Diagnosis not present

## 2018-10-15 DIAGNOSIS — Z515 Encounter for palliative care: Secondary | ICD-10-CM | POA: Diagnosis not present

## 2018-10-15 DIAGNOSIS — C342 Malignant neoplasm of middle lobe, bronchus or lung: Secondary | ICD-10-CM | POA: Diagnosis not present

## 2018-10-15 NOTE — Telephone Encounter (Signed)
Summer Williams [phone]  Jeneen Rinks called and would like to talk with you about his mom being at [facility] under [facility].

## 2018-10-15 NOTE — Telephone Encounter (Signed)
Scheduled Virtual visit

## 2018-10-15 NOTE — Telephone Encounter (Signed)
See if he will do virtual visit

## 2018-10-18 DIAGNOSIS — S52501A Unspecified fracture of the lower end of right radius, initial encounter for closed fracture: Secondary | ICD-10-CM | POA: Diagnosis not present

## 2018-10-18 DIAGNOSIS — S42411A Displaced simple supracondylar fracture without intercondylar fracture of right humerus, initial encounter for closed fracture: Secondary | ICD-10-CM | POA: Diagnosis not present

## 2018-10-22 DIAGNOSIS — J449 Chronic obstructive pulmonary disease, unspecified: Secondary | ICD-10-CM | POA: Diagnosis not present

## 2018-10-27 ENCOUNTER — Other Ambulatory Visit: Payer: Self-pay | Admitting: Internal Medicine

## 2018-10-31 ENCOUNTER — Encounter: Payer: Self-pay | Admitting: Internal Medicine

## 2018-10-31 NOTE — Progress Notes (Signed)
Subjective:    Patient ID: Summer Williams, female    DOB: [birth year], 73 y.o.   MRN: [id number]  HPI Patient's son called  regarding patient's condition.  She is currently at [facility] under Hospice care.  Son has some questions about her condition and what to expect in the coming days.  We were able to connect with him by interactive audio and video telecommunications today due to the coronavirus pandemic.  He is agreeable to visit in this format and is accompanied by his wife Summer Williams.  2 identifiers were used in identifying him and patient.  Ms. Summer Williams has been ill for some time.  She has a history of lung cancer and did not want aggressive treatment.  Suffered a fall at home resulting in a fractured humerus and ulna.  He also was found to have a subdural hematoma overlying the right cerebral convexity measuring up to 6 mm in maximal thickness on April 26.  There was a smaller subdural hemorrhage seen overlying the left parietal convexity measuring up to 5 mm.  Also associated with a trace 1 to 2 mm right to left shift without hydrocephalus.  She had reduction internal fixation of distal humerus fracture on April 30.  Patient was noted to have a right periorbital hematoma after her fall.  Due to her low platelet count she received 1 unit of platelets.  Neurosurgery recommended the patient be followed and not undergo surgery to drain the hematomas.  Dr. Quintella Williams was following patient for breast and lung cancer.  She refused aggressive treatment for these cancers but agreed to have regular radiology monitoring.  On May 8 I had a virtual visit with patient and she was noted to have a right infraorbital hematoma.  She appeared to recognize me.  Home health stamp indicated she had.  Little urine output and that her urine was dark yellow.  They are concerned about a possible urinary infection.  We talked about having a home health nurse coming in trying to get some lab work.  However  I was concerned that she might need to return to the hospital for volume depletion.  Patient indicated she had a DNR.  She was reluctant to go back to the hospital.  He was transfused on May 5 prior to her hospital discharge.  On May 3 her hemoglobin was 9.3 g and her platelet count was 60,000.  Her white blood cell count at that time was 13,900.  Family indicated they are reluctant to seek rehab admission due to COVID-19 pandemic.  They felt she would be better off at home but now is finding it difficult to take care of her.  They have been with her constantly.  Patient believes she is running a low-grade fever of 99 to 100 degrees.  We agreed at the end of the interview that home health personnel would come and try to draw labs but recognizing we might not have results until the following day.  After lunch, I got a call that patient had acute deterioration in her mental status and that they were transporting her to the hospital.  She was admitted to the hospital on May 8 and discharged to [facility] on May 10.  CT head showed a possible new subarachnoid hemorrhage along with increased size of right subdural hematoma with increased mass-effect with midline shift.  Neurosurgery was consulted and they recommended a bur hole drainage but patient refused surgical intervention.  Palliative care discussion was held and patient  agreed to go to hospice.  It was felt that her overall prognosis was poor.  Son is asking today if patient needs to return to the hospital.  He says that she is lethargic and does not appear to be in any pain but he is having some type of thoughts about bringing her to Hospice.  Apparently has gotten little information from hospice staff regarding process.  Explained to him that it was okay for patient not to be taking in fluids.  As long as she is comfortable and does not appear to be in pain, there is no need for pain medication.  Explained to him that the dying process involves  patient not being able to take in oral fluids and basically passing away either due to complications from the subdural hematoma or from dehydration from not taking in oral fluids.  He and his wife frankly for the conversation and explaining the process.  He has my opinion that indicated to him that I thought it was best to let the patient passed away at this time given her multiple medical issues, poor prognosis with subdural hematoma with mass-effect and poor likelihood of meaningful recovery due to multiple medical issues.  25 minutes spent with patient's son and his wife.     Review of Systems     Objective:   Physical Exam        Assessment & Plan:

## 2018-10-31 NOTE — Patient Instructions (Signed)
Virtual visit with patient's son and and his wife regarding discussion surrounding patient's clinical course.

## 2018-11-01 DEATH — deceased

## 2018-12-02 ENCOUNTER — Other Ambulatory Visit: Payer: Self-pay | Admitting: Hematology and Oncology

## 2018-12-02 DIAGNOSIS — C50312 Malignant neoplasm of lower-inner quadrant of left female breast: Secondary | ICD-10-CM

## 2018-12-27 ENCOUNTER — Ambulatory Visit: Payer: PPO | Admitting: Internal Medicine

## 2020-06-26 IMAGING — CR RIGHT HAND - COMPLETE 3+ VIEW
3 series · 3 of 3 positions shown · non-contrast
Comparison: None.

CLINICAL DATA: Fall

EXAM:
RIGHT HAND - COMPLETE 3+ VIEW

[hand pa]
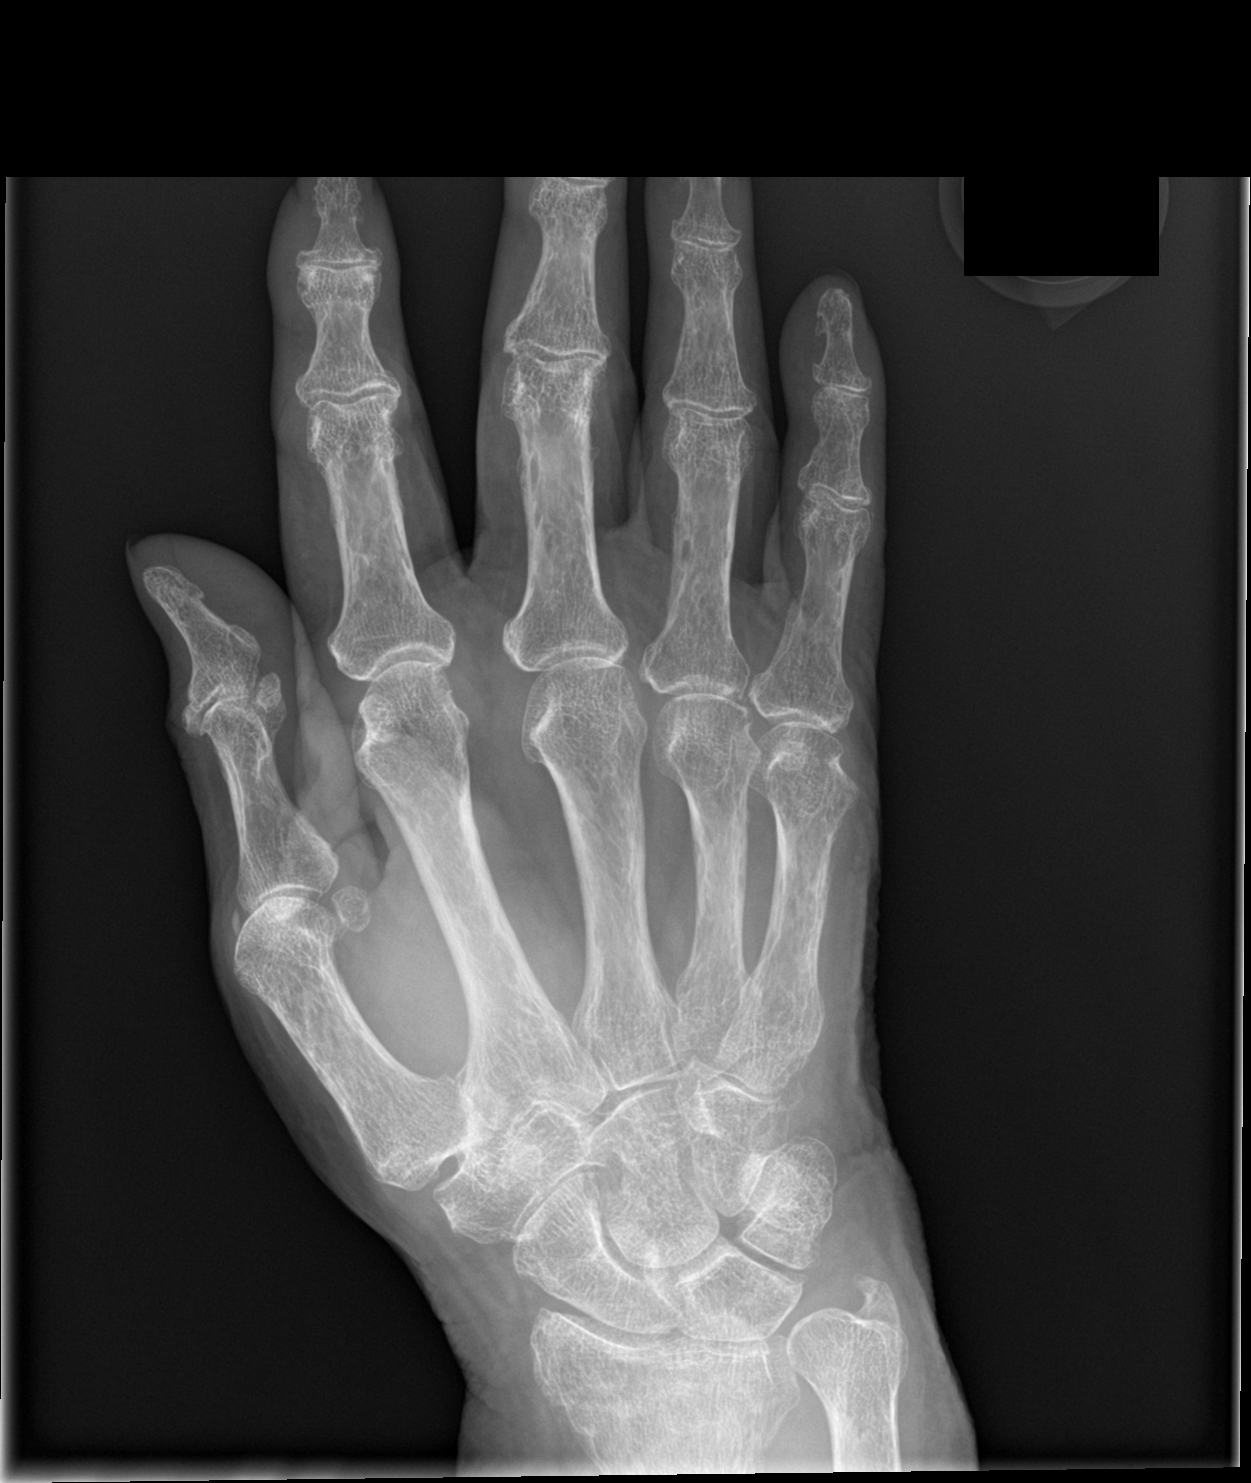

[hand obl]
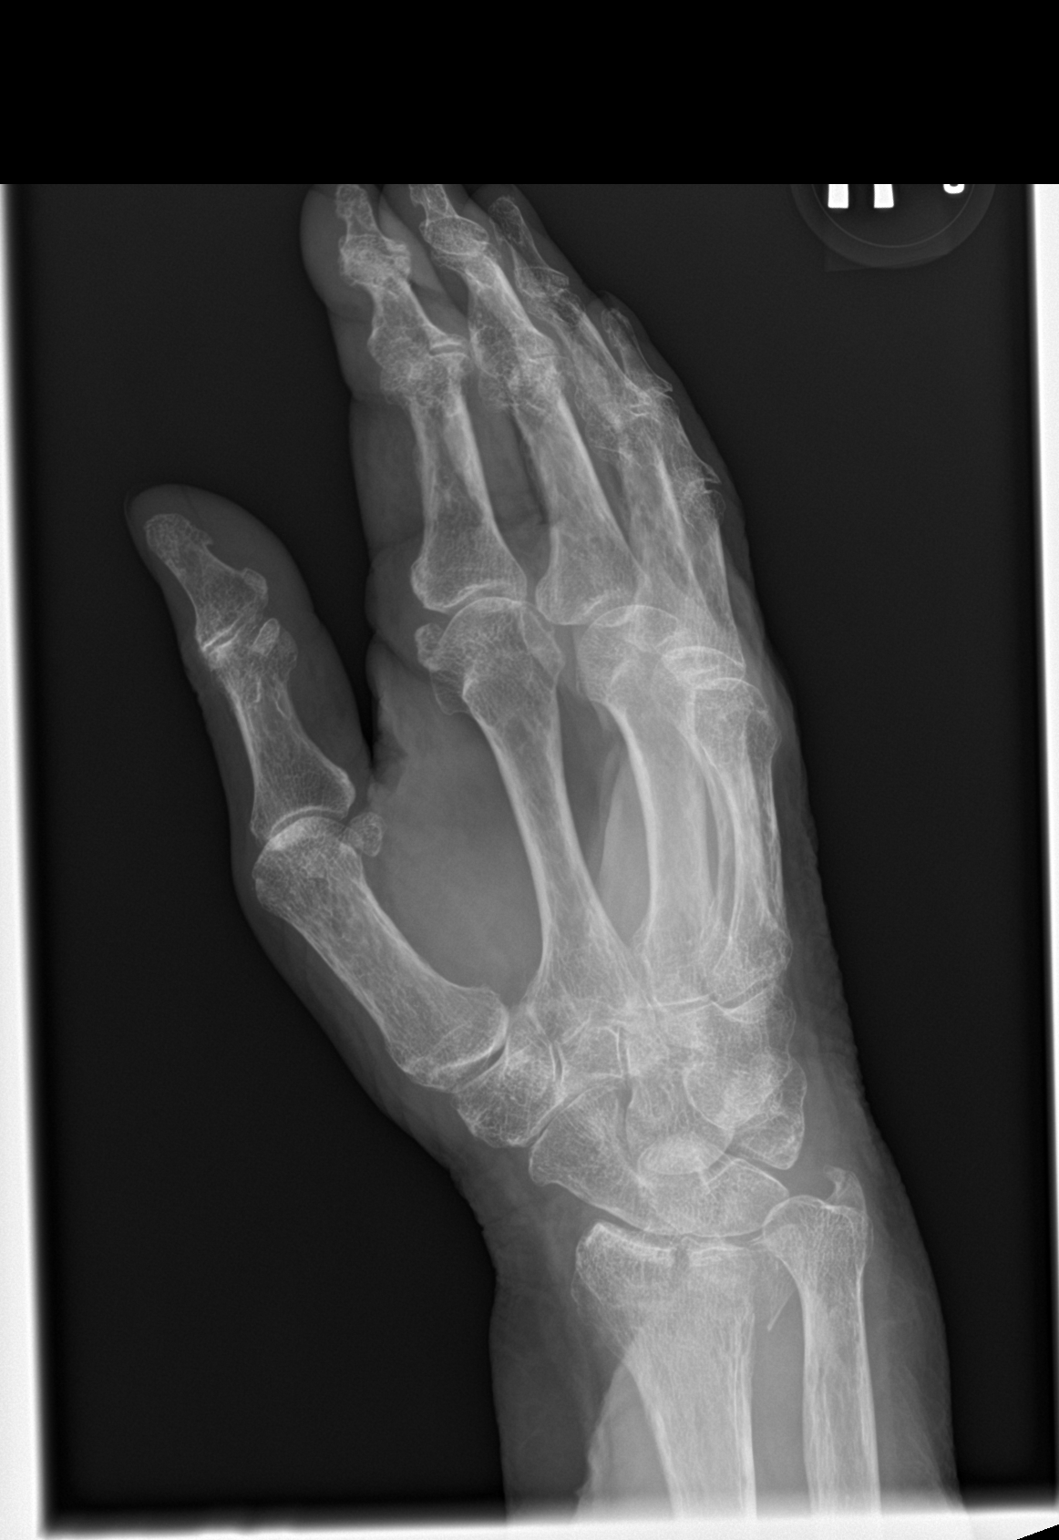

[hand lat]
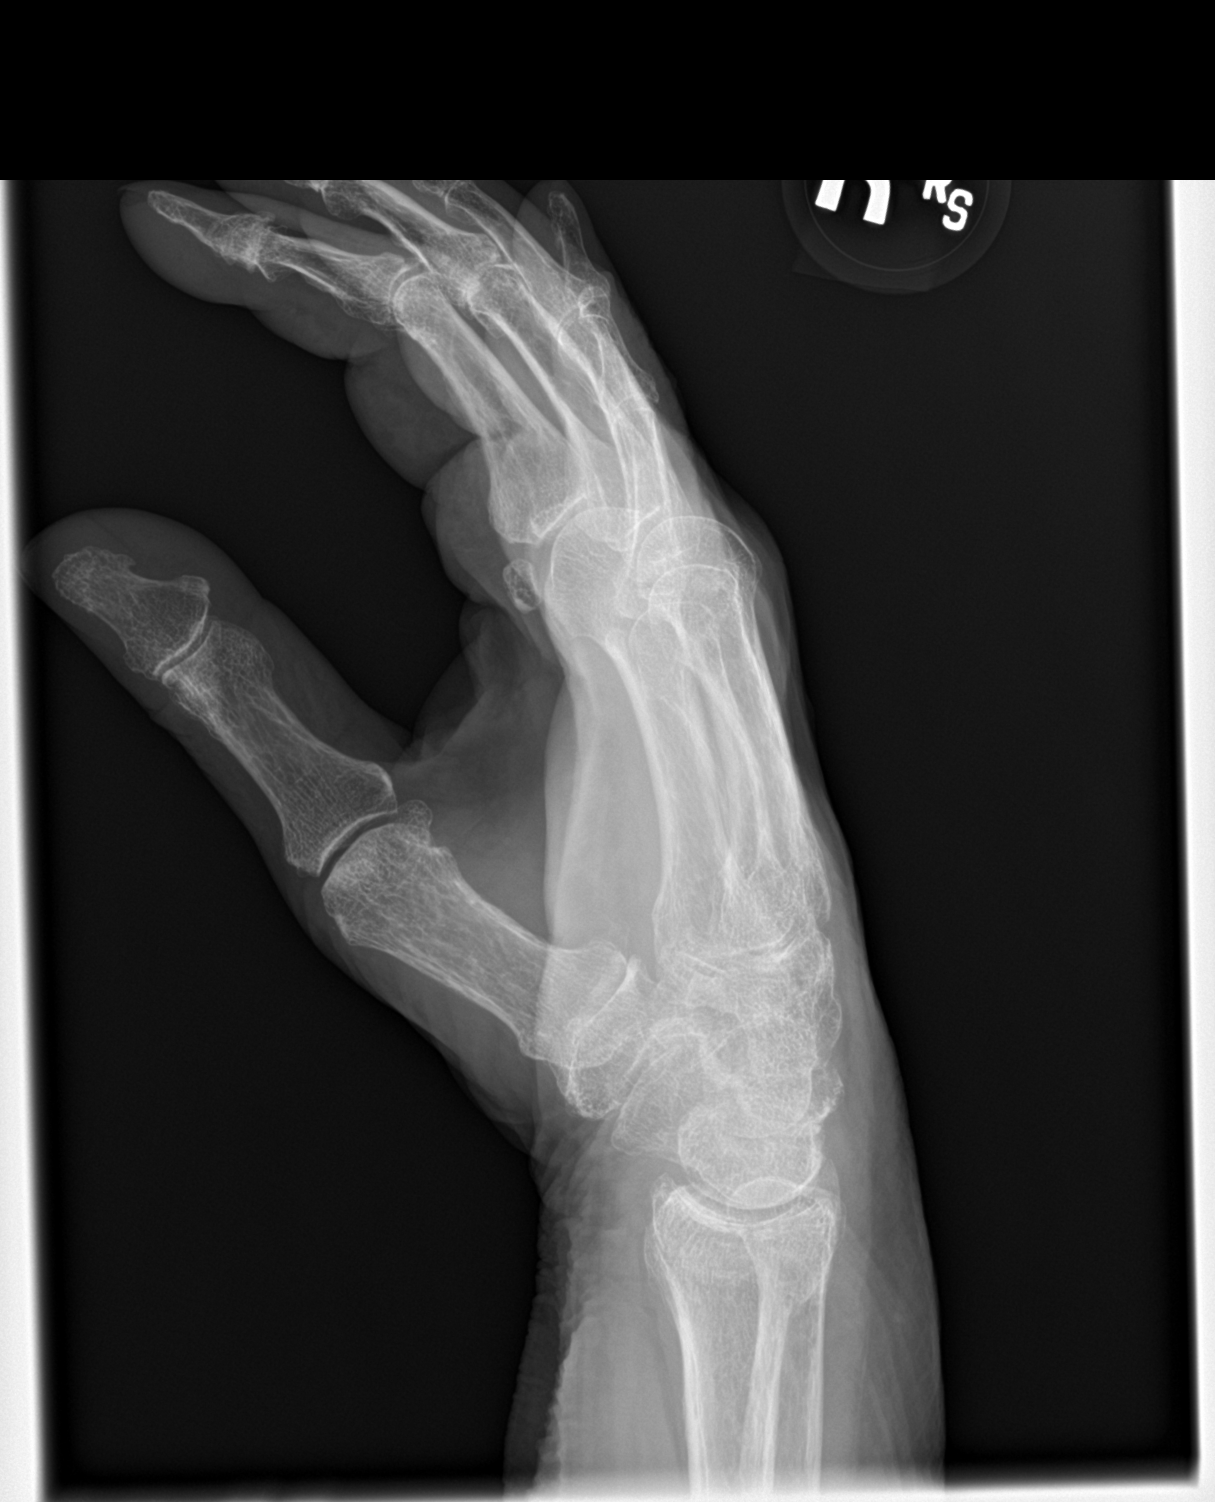

[3 of 3 positions shown; findings below may reference images not displayed]

FINDINGS: Bones appear osteopenic. Mild arthritis at the first CMC joint.
Degenerative changes at the DIP and PIP joints. Acute nondisplaced
fracture ulnar styloid process. Acute, mildly comminuted and
impacted intra-articular fracture at the distal radius.
IMPRESSION: 1. Acute comminuted impacted intra-articular fracture of the distal
radius
2. Acute nondisplaced ulnar styloid process fracture

## 2020-06-26 IMAGING — CR PELVIS - 1-2 VIEW
1 series · 1 of 1 positions shown · non-contrast
Comparison: None.

CLINICAL DATA: Fall

EXAM:
PELVIS - 1-2 VIEW

[pelvis ap]
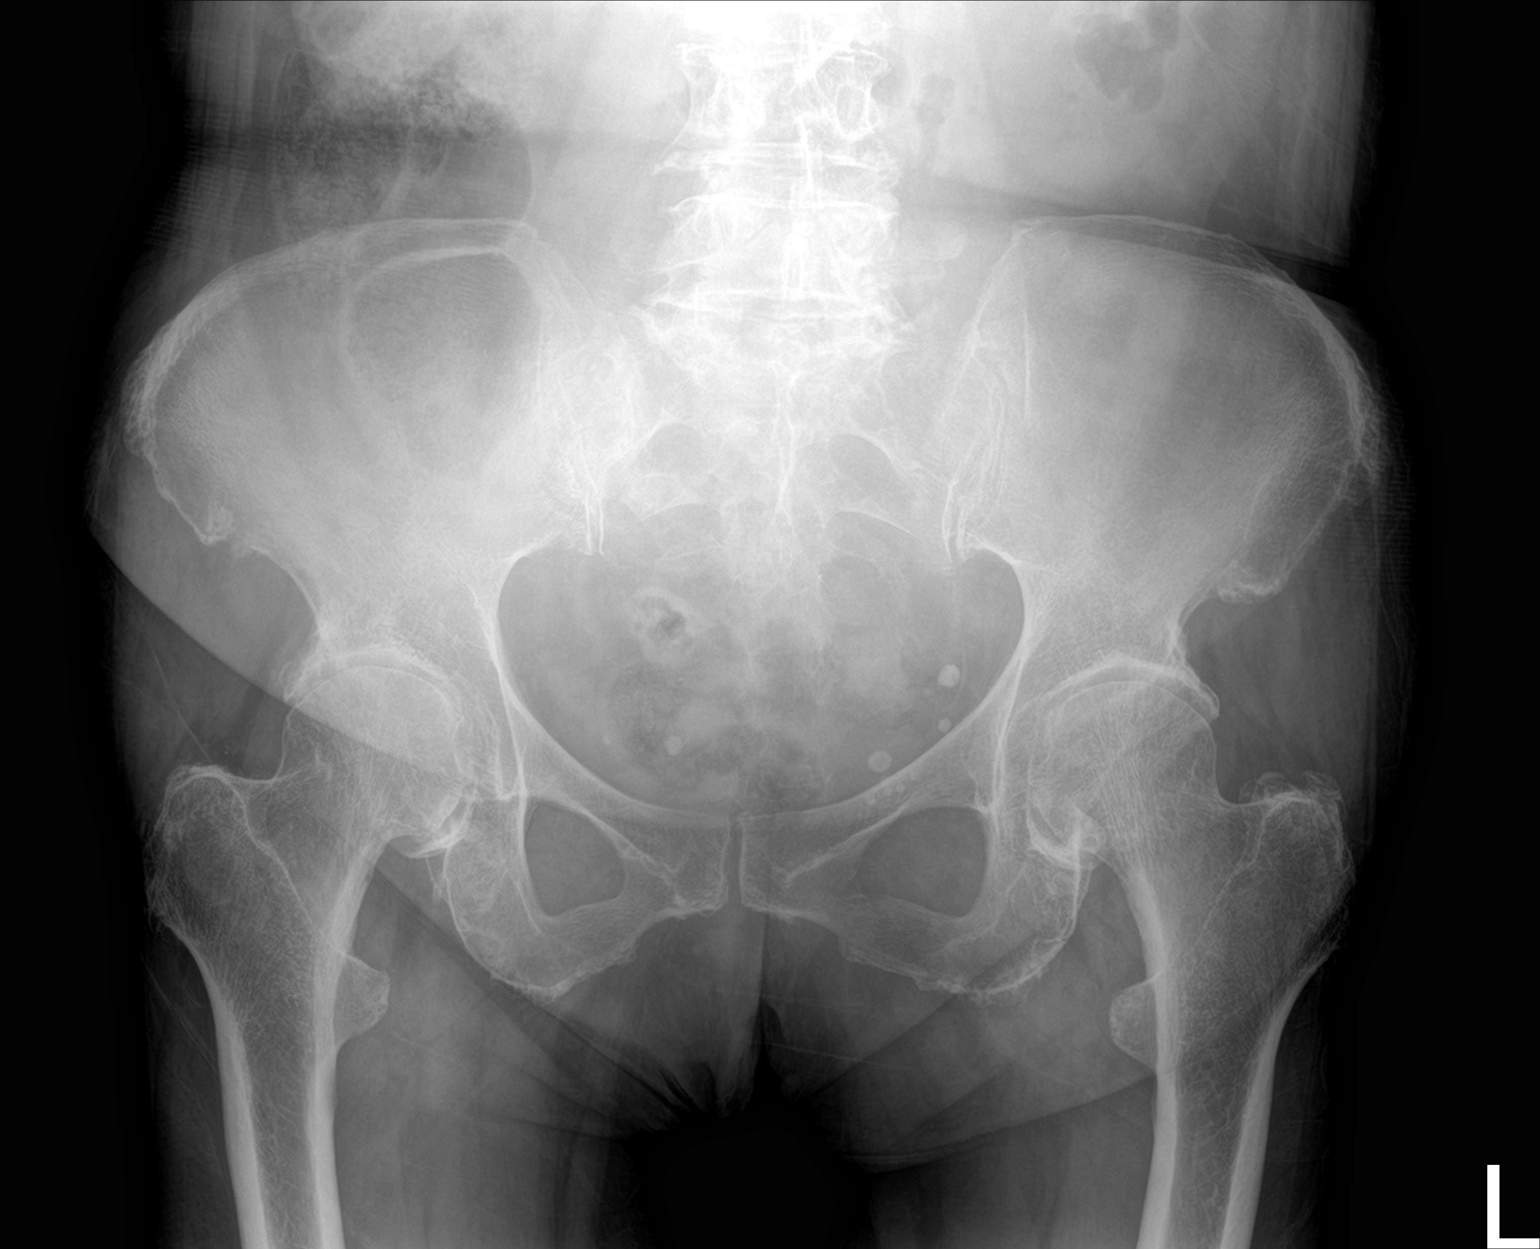

[1 of 1 positions shown; findings below may reference images not displayed]

FINDINGS: SI joints are intact. Pubic symphysis and rami show no fracture. No
fracture or dislocation is evident.
IMPRESSION: No acute osseous abnormality

## 2020-06-26 IMAGING — CT CT MAXILLOFACIAL WITHOUT CONTRAST
5 of 13 series · 15 of 47 positions shown, 17 images · non-contrast
Comparison: None.

CLINICAL DATA: Initial evaluation for acute trauma, fall.

EXAM:
CT HEAD WITHOUT CONTRAST
CT MAXILLOFACIAL WITHOUT CONTRAST
CT CERVICAL SPINE WITHOUT CONTRAST
TECHNIQUE: Multidetector CT imaging of the head, cervical spine, and
maxillofacial structures were performed using the standard protocol
without intravenous contrast. Multiplanar CT image reconstructions
of the cervical spine and maxillofacial structures were also
generated.

[Series 5: head bone · axial · 0.46mm/px · z∈[-148,-116]mm · 2 of 83 slices shown]
[im 17/83  bone]
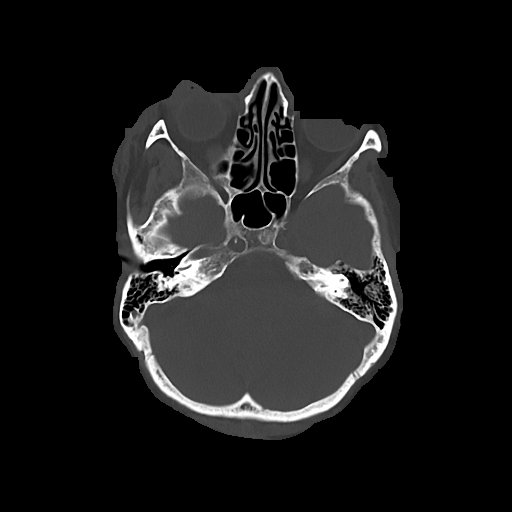
[im 33/83  bone]
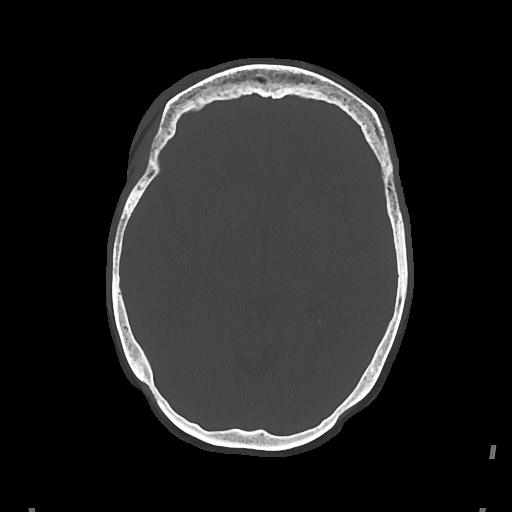

[Series 8: maxilllofacial 2.0 hr40 3 · axial · 0.38mm/px · z∈[-240,-128]mm · 5 of 84 slices shown, 7 images]
[im 14/84  brain]
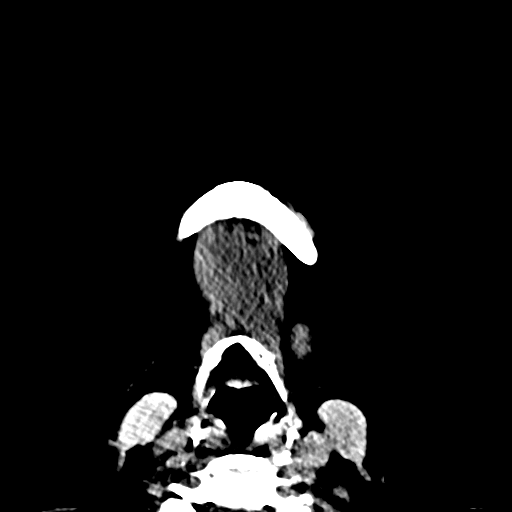
[im 14/84  bone]
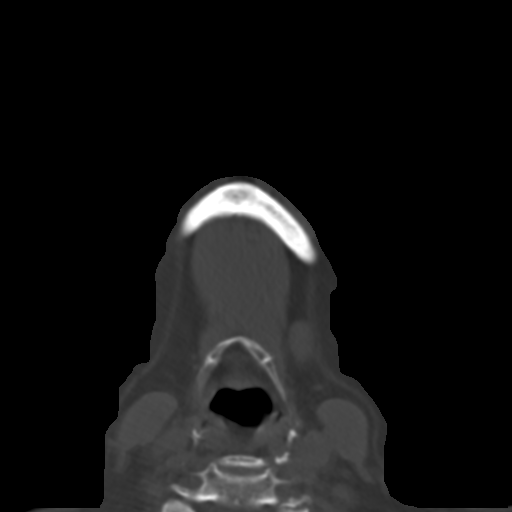
[im 28/84  bone]
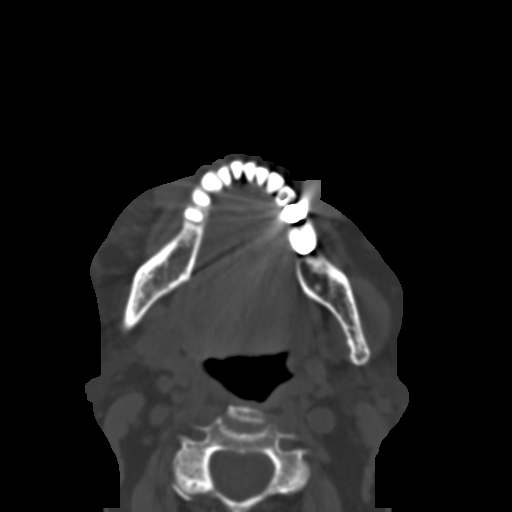
[im 42/84  bone]
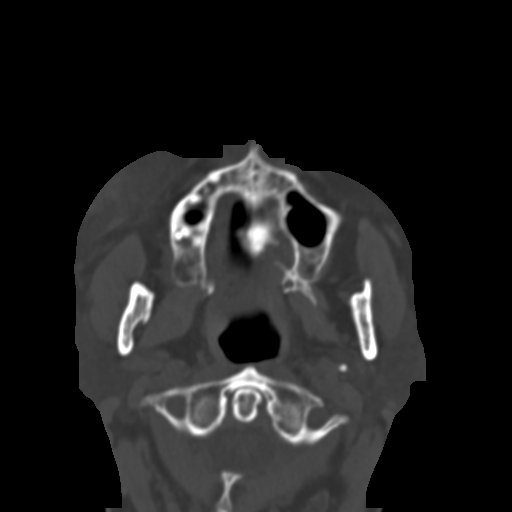
[im 56/84  bone]
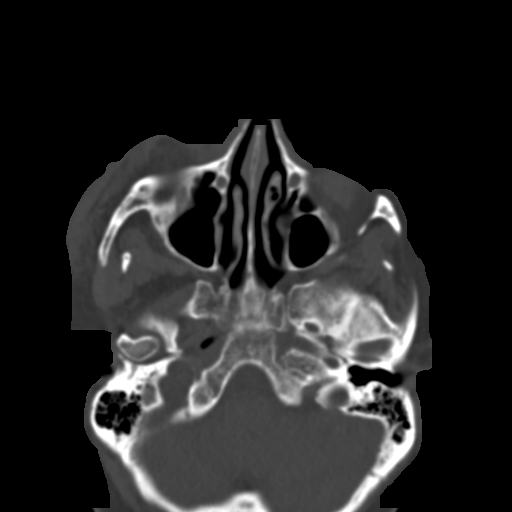
[im 70/84  brain]
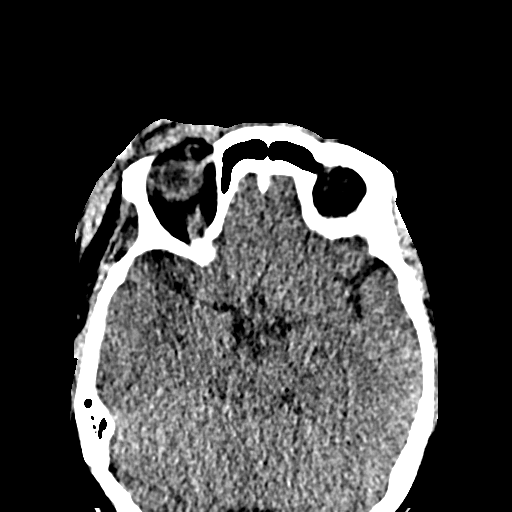
[im 70/84  bone]
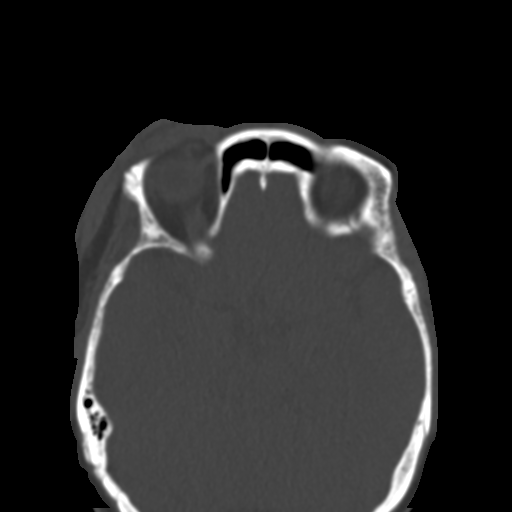

[Series 10: maxilllofacial 2.0 hr59 3 · axial · 0.38mm/px · z∈[-240,-128]mm · 5 of 84 slices shown]
[im 14/84  bone]
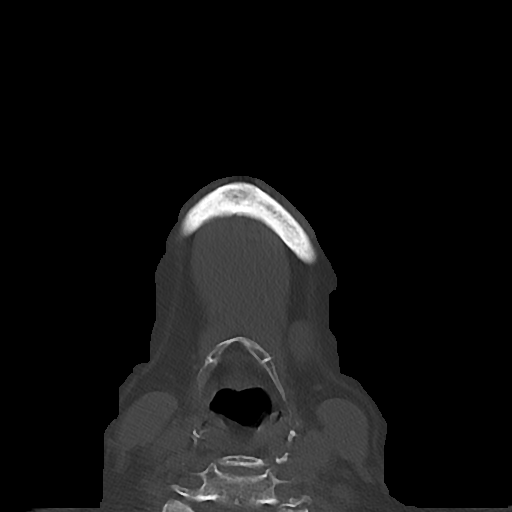
[im 28/84  bone]
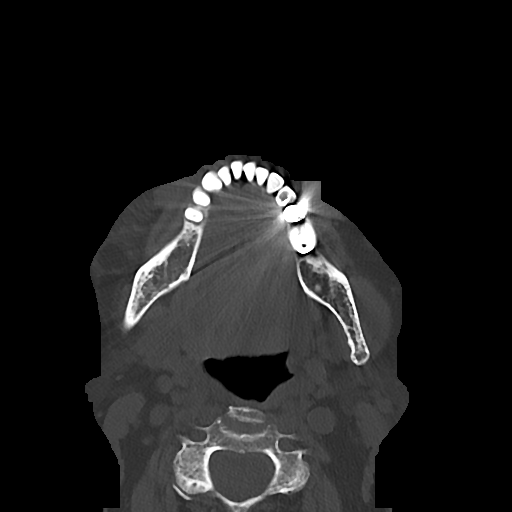
[im 42/84  bone]
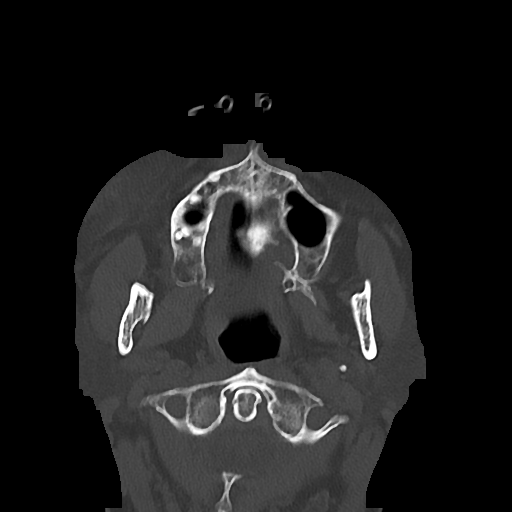
[im 56/84  bone]
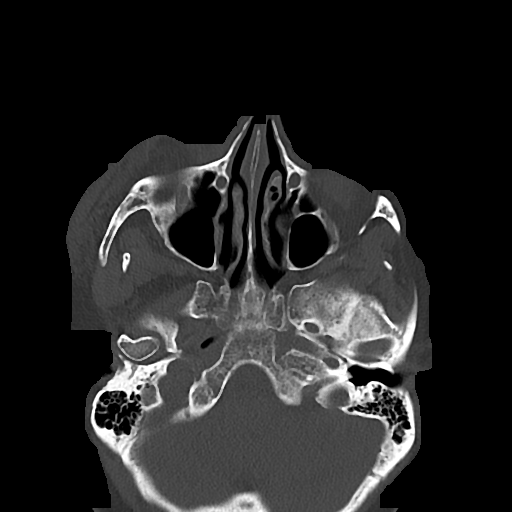
[im 70/84  bone]
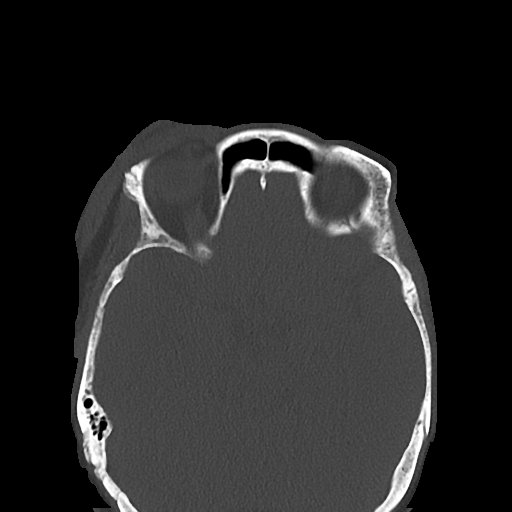

[Series 15: bone sag · sagittal · 0.34mm/px · 1 of 87 slices shown]
[im 40/87  bone]
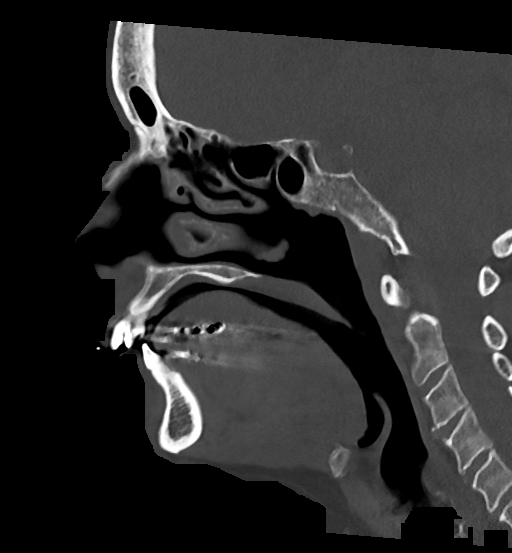

[Series 19: cor bone · coronal · 0.28mm/px · 2 of 106 slices shown]
[im 36/106  bone]
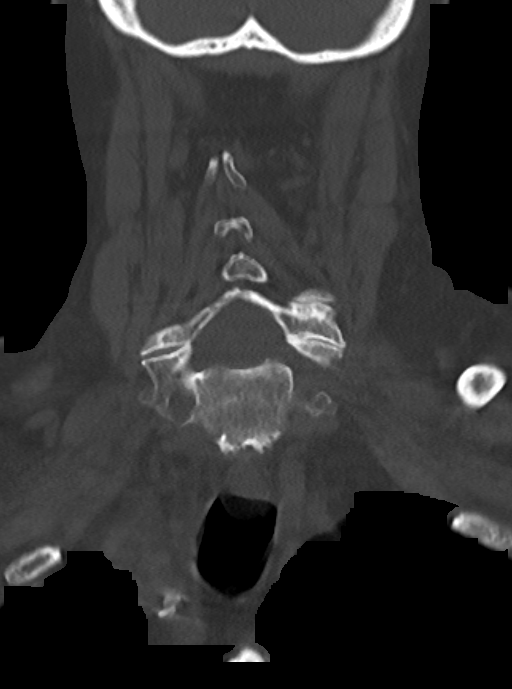
[im 71/106  bone]
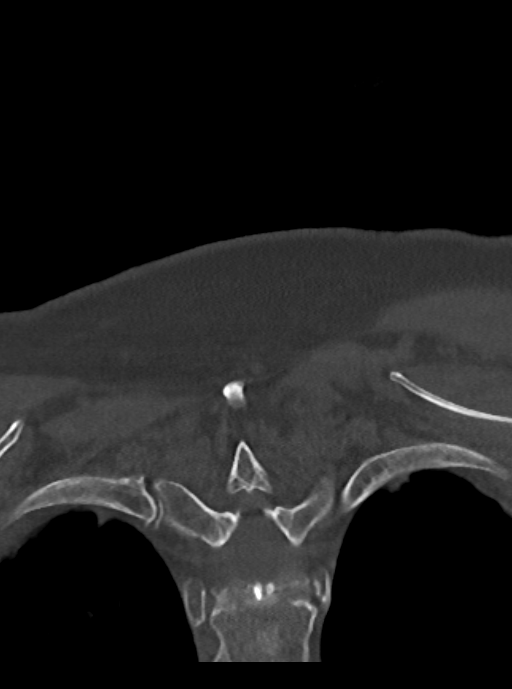

[15 of 47 positions shown; findings below may reference images not displayed]

FINDINGS: CT HEAD FINDINGS

Brain: Generalized age-related cerebral atrophy with mild chronic
small vessel ischemic disease. Acute subdural hematoma overlies the
right cerebral convexity, measuring up to 6 mm in maximal thickness.
Additional smaller subdural hemorrhage seen overlying the left
parietal convexity, measuring up to 5 mm in maximal diameter.
Associated trace 1-2 mm right-to-left shift. No hydrocephalus or
ventricular trapping. No other acute intracranial hemorrhage. No
acute large vessel territory infarct. No mass lesion.

Vascular: No hyperdense vessel. Scattered vascular calcifications
noted within the carotid siphons.

Skull: Soft tissue contusion at the right periorbital region.
Calvarium intact.

Other: Trace left mastoid effusion noted, of doubtful significance.

CT MAXILLOFACIAL FINDINGS

Osseous: A zygomatic arches are intact. No acute maxillary fracture.
Pterygoid plates intact. No acute nasal bone fracture. Mild
left-to-right nasal septal deviation without associated fracture.
Mandible intact. Mandibular condyles normally situated. No acute
abnormality about the dentition.

Orbits: Globes intact. Normal retro-orbital hematoma or other
pathology. Bony orbits intact without fracture.

Sinuses: Paranasal sinuses are clear.  No hemosinus.

Soft tissues: Large right periorbital/facial contusion.

CT CERVICAL SPINE FINDINGS

Alignment: Straightening of the normal cervical lordosis. No
listhesis.

Skull base and vertebrae: Skull base intact. Normal C1-2
articulations are preserved in the dens is intact. Vertebral body
heights maintained. No acute fracture.

Soft tissues and spinal canal: Soft tissues of the neck demonstrate
no acute finding. No abnormal prevertebral edema. Spinal canal
within normal limits.

Disc levels: Mild right-sided facet arthrosis at C2-3 and C3-4. No
significant spinal stenosis within the cervical spine.

Upper chest: Visualized upper chest demonstrates no acute finding.
Upper lobe predominant centrilobular emphysema noted.

Other: None.
IMPRESSION: CT HEAD:

1. Acute bilateral subdural hematomas, right larger than left as
above. Associated trace 1-2 mm right-to-left midline shift.
2. No other acute intracranial abnormality.
3. Mild age-related cerebral atrophy with chronic small vessel
ischemic disease.

CT MAXILLOFACIAL:

1. Large right periorbital/facial soft tissue contusion.
2. No other acute maxillofacial injury. No fracture. Intact globes
with no retro-orbital pathology.

CT CERVICAL SPINE:

No acute traumatic injury within the cervical spine.

Critical Value/emergent results were called by telephone at the time
of interpretation on 09/26/2018 at [DATE] to Dr. ISAI APOLLON ,
who verbally acknowledged these results.

## 2020-06-26 IMAGING — CR RIGHT ELBOW - COMPLETE 3+ VIEW
4 series · 4 of 4 positions shown · non-contrast
Comparison: None.

CLINICAL DATA: Fall

EXAM:
RIGHT ELBOW - COMPLETE 3+ VIEW

[elbow ap]
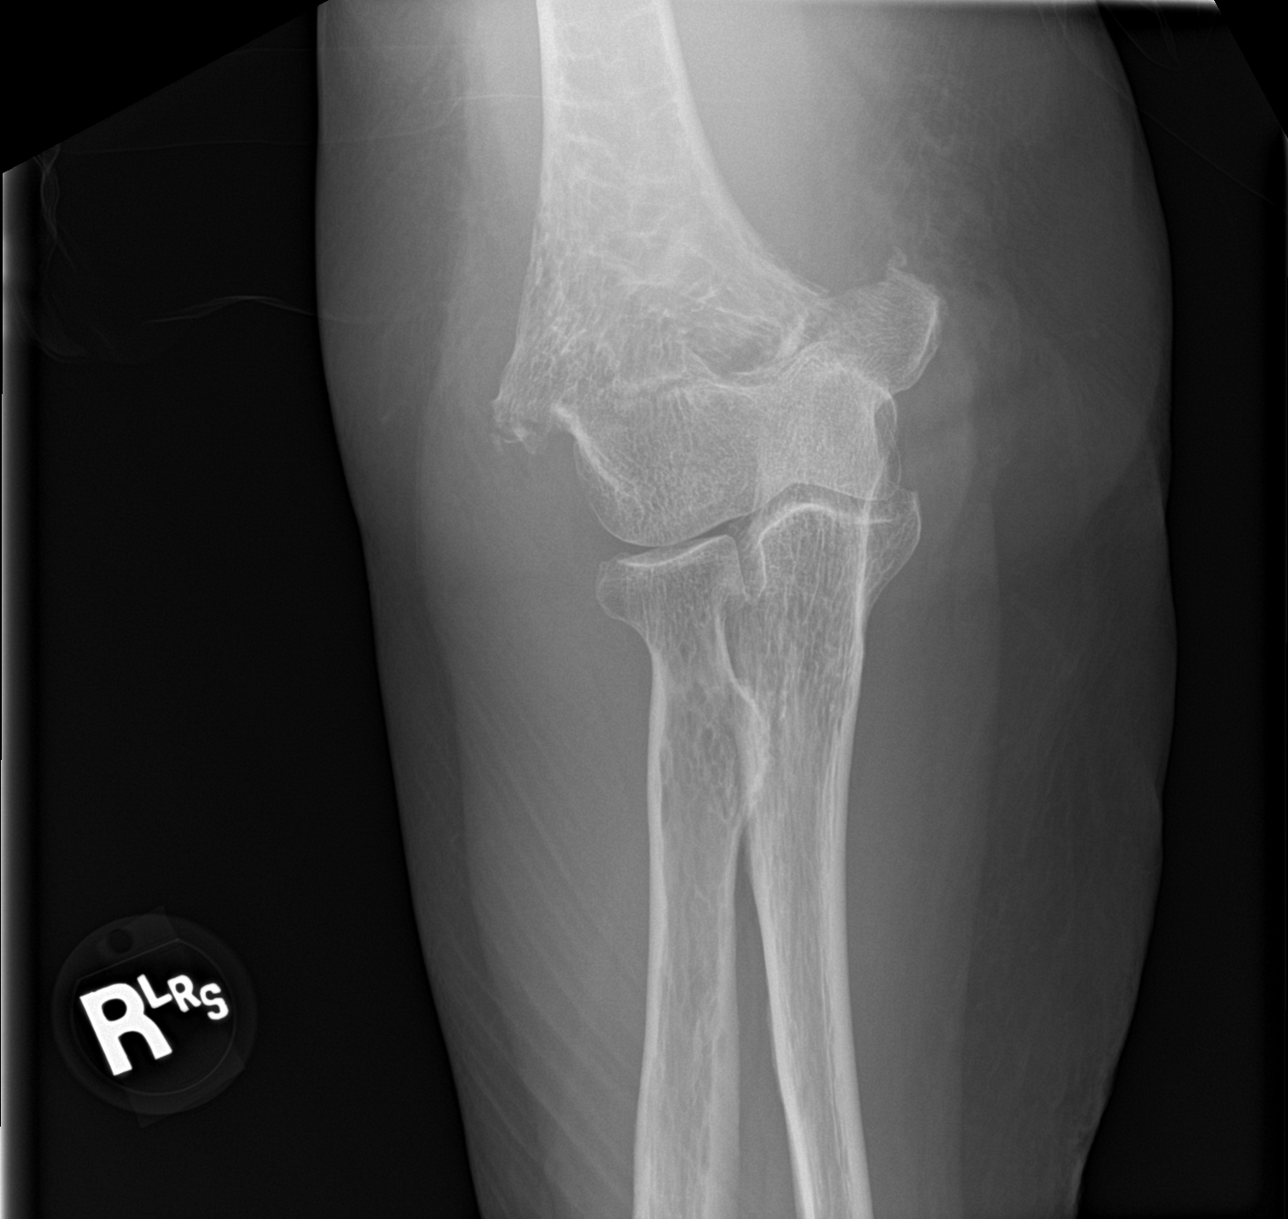

[elbow obl (1 of 2)]
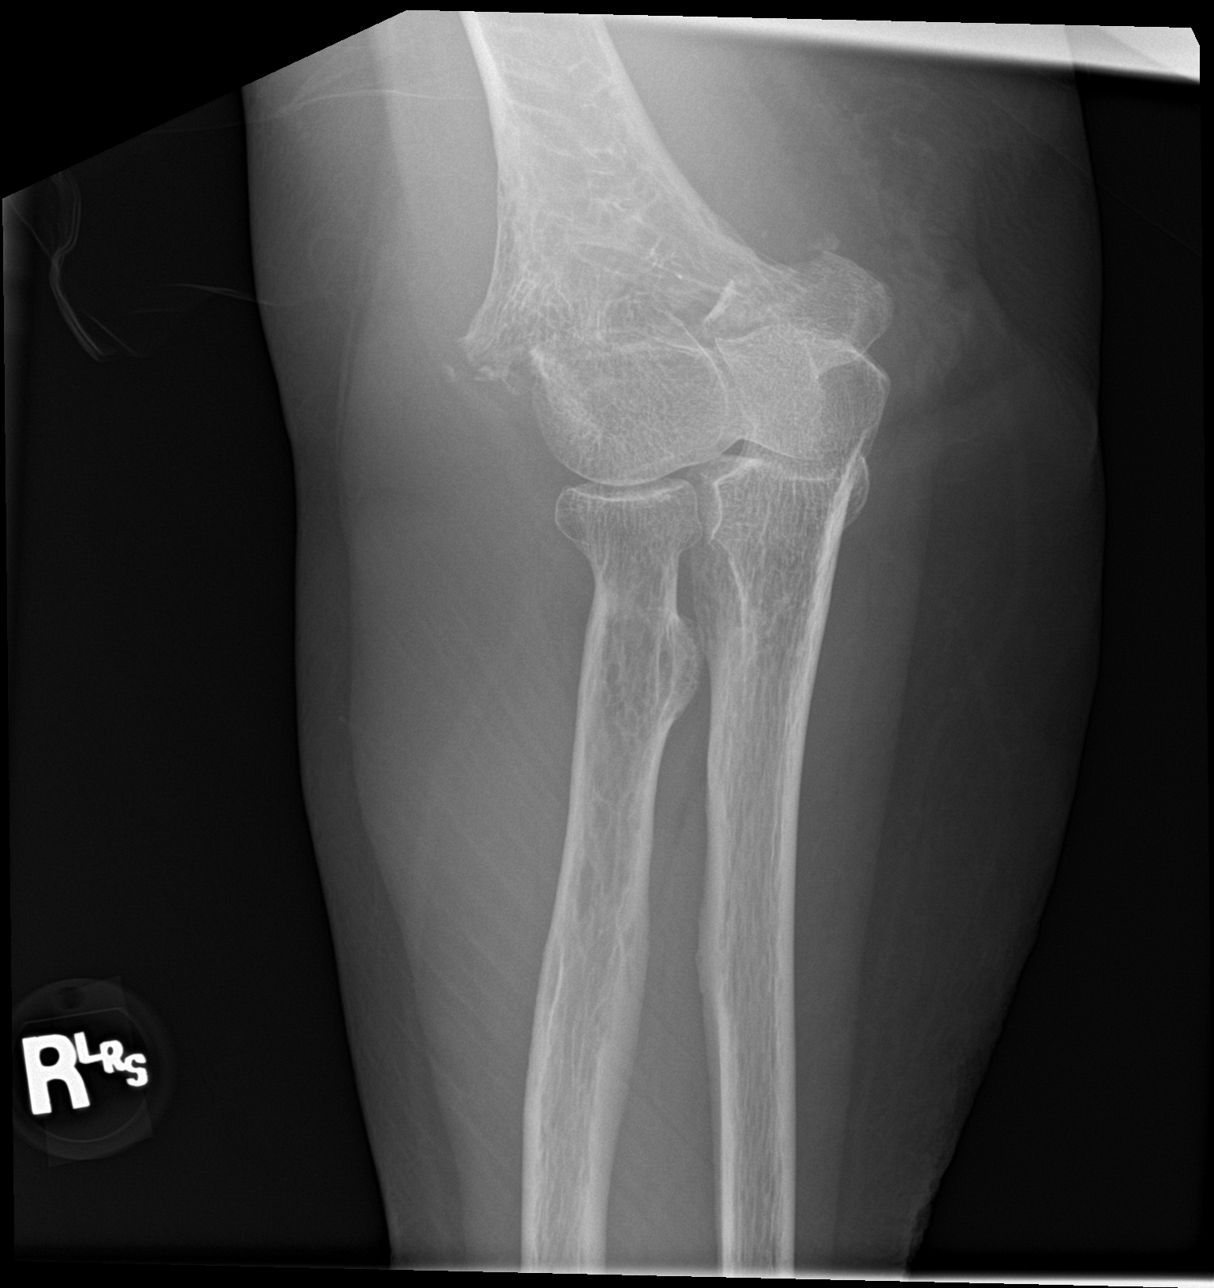

[elbow obl (2 of 2)]
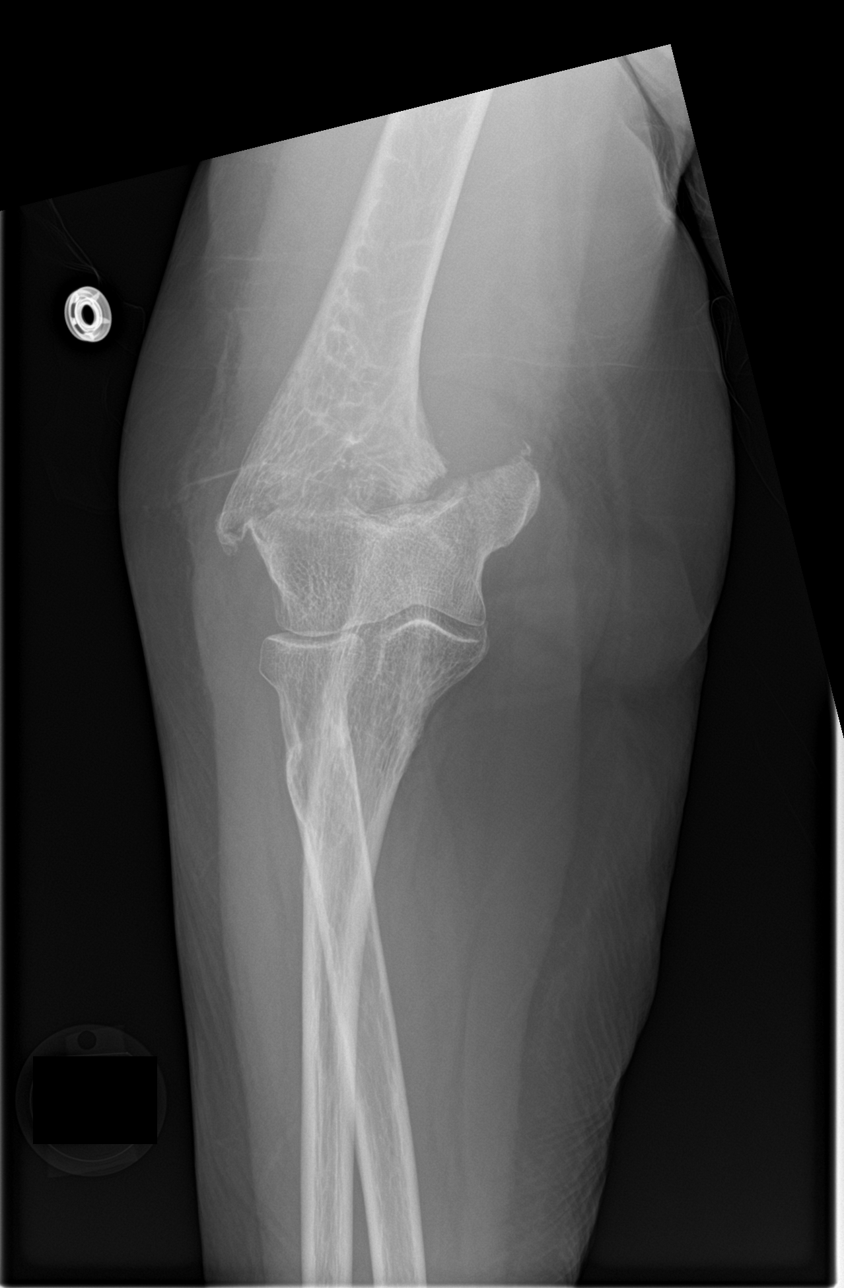

[elbow lat]
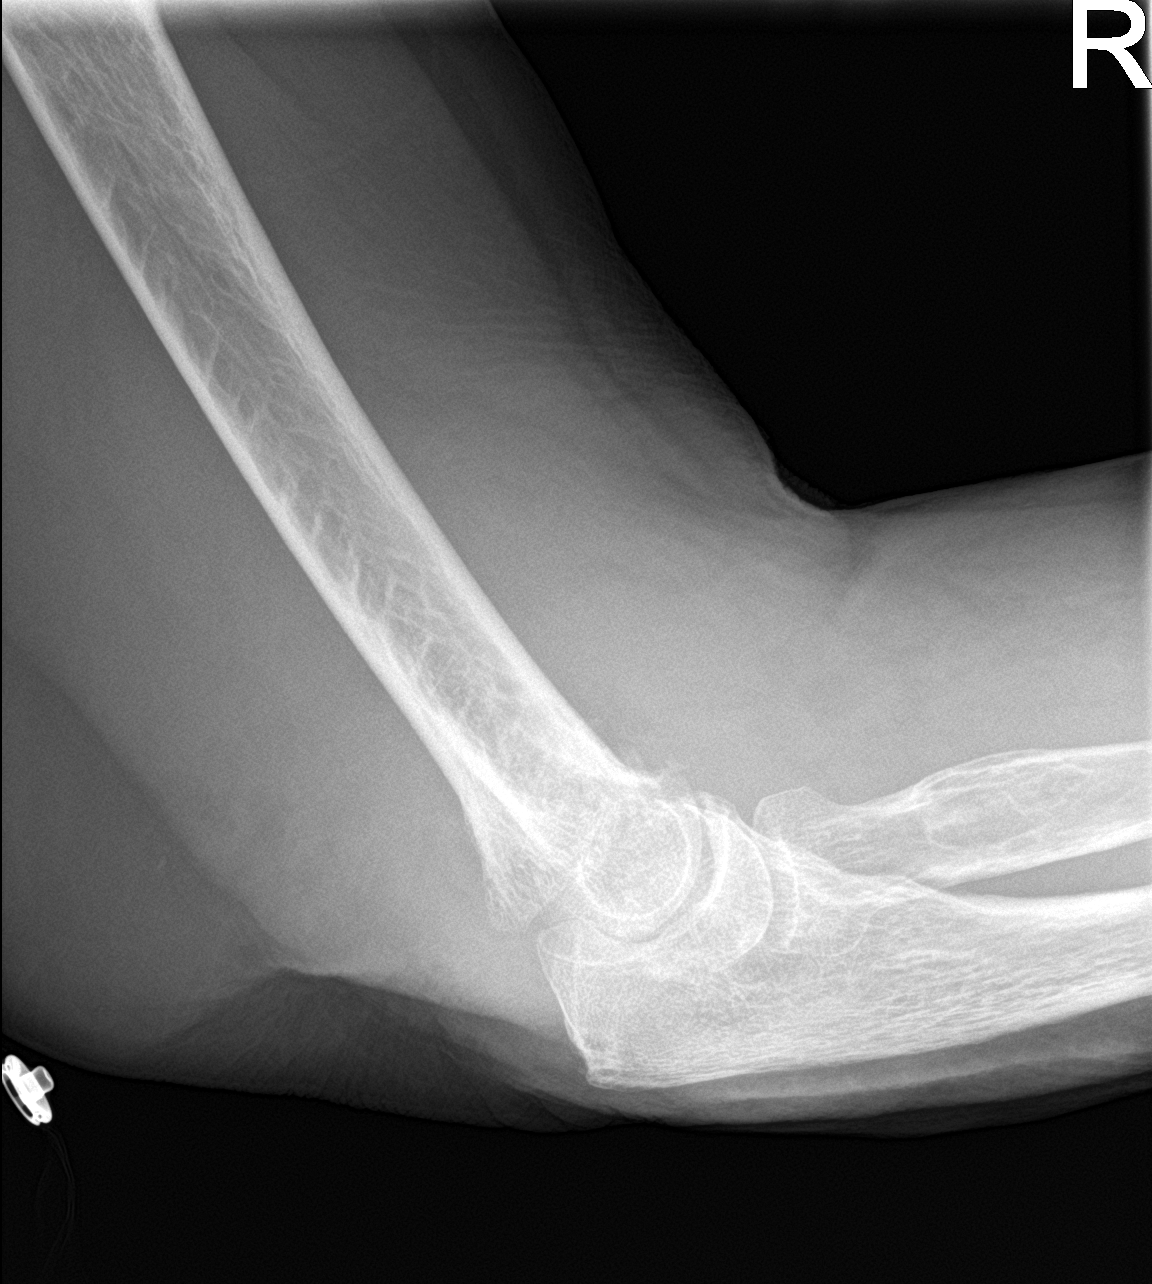

[4 of 4 positions shown; findings below may reference images not displayed]

FINDINGS: Acute, mildly comminuted fracture involving the distal humerus above
the level of the condyles. About [DATE] bone with ulnar displacement of
distal fracture fragment. Associated large elbow effusion. Radial
head alignment is within normal limits.
IMPRESSION: Acute, mildly comminuted and displaced distal humerus fracture with
associated elbow effusion
